# Patient Record
Sex: Female | Born: 1955 | Race: White | Hispanic: No | Marital: Single | Smoking: Current every day smoker
Health system: Southern US, Community
[De-identification: ages and names within clinical notes are randomized; demographics above are authoritative.]

## PROBLEM LIST (undated history)

## (undated) DIAGNOSIS — K589 Irritable bowel syndrome without diarrhea: Secondary | ICD-10-CM

## (undated) DIAGNOSIS — I519 Heart disease, unspecified: Secondary | ICD-10-CM

## (undated) DIAGNOSIS — I219 Acute myocardial infarction, unspecified: Secondary | ICD-10-CM

## (undated) DIAGNOSIS — M1990 Unspecified osteoarthritis, unspecified site: Secondary | ICD-10-CM

## (undated) DIAGNOSIS — I10 Essential (primary) hypertension: Secondary | ICD-10-CM

## (undated) DIAGNOSIS — E785 Hyperlipidemia, unspecified: Secondary | ICD-10-CM

## (undated) DIAGNOSIS — C8330 Diffuse large B-cell lymphoma, unspecified site: Secondary | ICD-10-CM

## (undated) DIAGNOSIS — F419 Anxiety disorder, unspecified: Secondary | ICD-10-CM

## (undated) DIAGNOSIS — K219 Gastro-esophageal reflux disease without esophagitis: Secondary | ICD-10-CM

## (undated) DIAGNOSIS — I2510 Atherosclerotic heart disease of native coronary artery without angina pectoris: Secondary | ICD-10-CM

## (undated) DIAGNOSIS — J449 Chronic obstructive pulmonary disease, unspecified: Secondary | ICD-10-CM

## (undated) HISTORY — DX: Diffuse large B-cell lymphoma, unspecified site: C83.30

## (undated) HISTORY — PX: TUBAL LIGATION: SHX77

## (undated) HISTORY — DX: Hyperlipidemia, unspecified: E78.5

## (undated) HISTORY — DX: Heart disease, unspecified: I51.9

## (undated) HISTORY — PX: CHOLECYSTECTOMY: SHX55

## (undated) HISTORY — DX: Atherosclerotic heart disease of native coronary artery without angina pectoris: I25.10

## (undated) HISTORY — DX: Essential (primary) hypertension: I10

## (undated) HISTORY — PX: CARDIAC CATHETERIZATION: SHX172

## (undated) HISTORY — DX: Chronic obstructive pulmonary disease, unspecified: J44.9

---

## 2002-05-19 ENCOUNTER — Ambulatory Visit (HOSPITAL_COMMUNITY): Admission: RE | Admit: 2002-05-19 | Discharge: 2002-05-19 | Payer: Self-pay | Admitting: Obstetrics & Gynecology

## 2002-05-19 ENCOUNTER — Encounter: Payer: Self-pay | Admitting: Obstetrics & Gynecology

## 2007-12-02 ENCOUNTER — Ambulatory Visit: Payer: Self-pay | Admitting: Family Medicine

## 2007-12-02 DIAGNOSIS — K589 Irritable bowel syndrome without diarrhea: Secondary | ICD-10-CM | POA: Insufficient documentation

## 2007-12-02 DIAGNOSIS — I059 Rheumatic mitral valve disease, unspecified: Secondary | ICD-10-CM | POA: Insufficient documentation

## 2007-12-02 DIAGNOSIS — F17200 Nicotine dependence, unspecified, uncomplicated: Secondary | ICD-10-CM

## 2007-12-02 DIAGNOSIS — J309 Allergic rhinitis, unspecified: Secondary | ICD-10-CM | POA: Insufficient documentation

## 2007-12-05 ENCOUNTER — Ambulatory Visit: Payer: Self-pay | Admitting: Family Medicine

## 2007-12-05 ENCOUNTER — Telehealth (INDEPENDENT_AMBULATORY_CARE_PROVIDER_SITE_OTHER): Payer: Self-pay | Admitting: Family Medicine

## 2007-12-15 ENCOUNTER — Ambulatory Visit: Payer: Self-pay | Admitting: Family Medicine

## 2007-12-16 ENCOUNTER — Encounter (INDEPENDENT_AMBULATORY_CARE_PROVIDER_SITE_OTHER): Payer: Self-pay | Admitting: Family Medicine

## 2007-12-16 LAB — CONVERTED CEMR LAB
Albumin: 4.5 g/dL (ref 3.5–5.2)
Alkaline Phosphatase: 125 units/L — ABNORMAL HIGH (ref 39–117)
BUN: 7 mg/dL (ref 6–23)
Calcium: 9.6 mg/dL (ref 8.4–10.5)
Creatinine, Ser: 0.78 mg/dL (ref 0.40–1.20)
Eosinophils Absolute: 0.1 10*3/uL (ref 0.0–0.7)
Glucose, Bld: 93 mg/dL (ref 70–99)
HCT: 46.8 % — ABNORMAL HIGH (ref 36.0–46.0)
HDL: 33 mg/dL — ABNORMAL LOW (ref >39)
Hemoglobin: 15.7 g/dL — ABNORMAL HIGH (ref 12.0–15.0)
Lymphs Abs: 3.9 10*3/uL (ref 0.7–4.0)
MCHC: 33.5 g/dL (ref 30.0–36.0)
MCV: 93.4 fL (ref 78.0–100.0)
Monocytes Absolute: 0.8 10*3/uL (ref 0.1–1.0)
Monocytes Relative: 9 % (ref 3–12)
Neutrophils Relative %: 47 % (ref 43–77)
Potassium: 4 meq/L (ref 3.5–5.3)
RBC: 5.01 M/uL (ref 3.87–5.11)
Total CHOL/HDL Ratio: 6.9
Triglycerides: 236 mg/dL — ABNORMAL HIGH (ref <150)
WBC: 9.1 10*3/uL (ref 4.0–10.5)

## 2007-12-30 ENCOUNTER — Ambulatory Visit: Payer: Self-pay | Admitting: Family Medicine

## 2007-12-30 DIAGNOSIS — F341 Dysthymic disorder: Secondary | ICD-10-CM

## 2007-12-30 DIAGNOSIS — E785 Hyperlipidemia, unspecified: Secondary | ICD-10-CM | POA: Insufficient documentation

## 2007-12-30 LAB — CONVERTED CEMR LAB: HDL goal, serum: 40 mg/dL

## 2008-01-02 ENCOUNTER — Telehealth (INDEPENDENT_AMBULATORY_CARE_PROVIDER_SITE_OTHER): Payer: Self-pay | Admitting: *Deleted

## 2008-01-03 ENCOUNTER — Telehealth (INDEPENDENT_AMBULATORY_CARE_PROVIDER_SITE_OTHER): Payer: Self-pay | Admitting: Family Medicine

## 2008-01-17 ENCOUNTER — Ambulatory Visit: Payer: Self-pay | Admitting: Family Medicine

## 2008-01-17 LAB — CONVERTED CEMR LAB: LDL Goal: 100 mg/dL

## 2008-01-24 ENCOUNTER — Telehealth (INDEPENDENT_AMBULATORY_CARE_PROVIDER_SITE_OTHER): Payer: Self-pay | Admitting: Family Medicine

## 2008-01-31 ENCOUNTER — Ambulatory Visit: Payer: Self-pay | Admitting: Family Medicine

## 2008-02-28 ENCOUNTER — Ambulatory Visit: Payer: Self-pay | Admitting: Family Medicine

## 2008-04-17 ENCOUNTER — Telehealth (INDEPENDENT_AMBULATORY_CARE_PROVIDER_SITE_OTHER): Payer: Self-pay | Admitting: *Deleted

## 2008-06-25 ENCOUNTER — Ambulatory Visit: Payer: Self-pay | Admitting: Family Medicine

## 2008-06-25 LAB — CONVERTED CEMR LAB: LDL Goal: 100 mg/dL

## 2008-06-26 ENCOUNTER — Encounter (INDEPENDENT_AMBULATORY_CARE_PROVIDER_SITE_OTHER): Payer: Self-pay | Admitting: Family Medicine

## 2008-06-27 LAB — CONVERTED CEMR LAB
ALT: 14 units/L (ref 0–35)
AST: 15 units/L (ref 0–37)
Alkaline Phosphatase: 91 units/L (ref 39–117)
Creatinine, Ser: 0.83 mg/dL (ref 0.40–1.20)
LDL Cholesterol: 144 mg/dL — ABNORMAL HIGH (ref 0–99)
Total Bilirubin: 0.5 mg/dL (ref 0.3–1.2)
Total CHOL/HDL Ratio: 8
VLDL: 72 mg/dL — ABNORMAL HIGH (ref 0–40)

## 2008-07-09 ENCOUNTER — Ambulatory Visit: Payer: Self-pay | Admitting: Family Medicine

## 2008-07-09 LAB — CONVERTED CEMR LAB: LDL Goal: 130 mg/dL

## 2008-09-03 ENCOUNTER — Ambulatory Visit: Payer: Self-pay | Admitting: Family Medicine

## 2008-09-03 ENCOUNTER — Ambulatory Visit (HOSPITAL_COMMUNITY): Admission: RE | Admit: 2008-09-03 | Discharge: 2008-09-03 | Payer: Self-pay | Admitting: Family Medicine

## 2008-09-03 DIAGNOSIS — R030 Elevated blood-pressure reading, without diagnosis of hypertension: Secondary | ICD-10-CM

## 2008-09-04 ENCOUNTER — Encounter (INDEPENDENT_AMBULATORY_CARE_PROVIDER_SITE_OTHER): Payer: Self-pay | Admitting: Family Medicine

## 2008-09-04 LAB — CONVERTED CEMR LAB
Albumin: 4.6 g/dL (ref 3.5–5.2)
Alkaline Phosphatase: 108 units/L (ref 39–117)
Eosinophils Absolute: 0.1 10*3/uL (ref 0.0–0.7)
Glucose, Bld: 88 mg/dL (ref 70–99)
Lipase: 136 units/L — ABNORMAL HIGH (ref 0–75)
Lymphocytes Relative: 44 % (ref 12–46)
Lymphs Abs: 4.5 10*3/uL — ABNORMAL HIGH (ref 0.7–4.0)
MCV: 91.3 fL (ref 78.0–100.0)
Neutrophils Relative %: 47 % (ref 43–77)
Platelets: 311 10*3/uL (ref 150–400)
Potassium: 3.9 meq/L (ref 3.5–5.3)
Sodium: 140 meq/L (ref 135–145)
Total Protein: 7.1 g/dL (ref 6.0–8.3)
WBC: 10.1 10*3/uL (ref 4.0–10.5)

## 2008-09-18 ENCOUNTER — Ambulatory Visit: Payer: Self-pay | Admitting: Family Medicine

## 2008-09-19 ENCOUNTER — Encounter (INDEPENDENT_AMBULATORY_CARE_PROVIDER_SITE_OTHER): Payer: Self-pay | Admitting: Family Medicine

## 2008-09-19 LAB — CONVERTED CEMR LAB: Lipase: 73 units/L (ref 0–75)

## 2008-11-08 ENCOUNTER — Ambulatory Visit: Payer: Self-pay | Admitting: Family Medicine

## 2008-11-08 LAB — CONVERTED CEMR LAB: LDL Goal: 100 mg/dL

## 2008-11-09 ENCOUNTER — Encounter (INDEPENDENT_AMBULATORY_CARE_PROVIDER_SITE_OTHER): Payer: Self-pay | Admitting: Family Medicine

## 2008-11-09 LAB — CONVERTED CEMR LAB
ALT: 13 units/L (ref 0–35)
AST: 16 units/L (ref 0–37)
Albumin: 4.4 g/dL (ref 3.5–5.2)
Alkaline Phosphatase: 100 units/L (ref 39–117)
Calcium: 9.8 mg/dL (ref 8.4–10.5)
Chloride: 105 meq/L (ref 96–112)
HDL: 36 mg/dL — ABNORMAL LOW (ref >39)
LDL Cholesterol: 146 mg/dL — ABNORMAL HIGH (ref 0–99)
Potassium: 4.2 meq/L (ref 3.5–5.3)
Sodium: 140 meq/L (ref 135–145)

## 2008-12-20 ENCOUNTER — Ambulatory Visit: Payer: Self-pay | Admitting: Family Medicine

## 2008-12-20 LAB — CONVERTED CEMR LAB
Cholesterol, target level: 200 mg/dL
LDL Goal: 130 mg/dL

## 2009-01-02 ENCOUNTER — Encounter (INDEPENDENT_AMBULATORY_CARE_PROVIDER_SITE_OTHER): Payer: Self-pay | Admitting: Family Medicine

## 2009-01-31 ENCOUNTER — Ambulatory Visit: Payer: Self-pay | Admitting: Family Medicine

## 2009-03-14 ENCOUNTER — Ambulatory Visit: Payer: Self-pay | Admitting: Family Medicine

## 2009-04-25 ENCOUNTER — Encounter (INDEPENDENT_AMBULATORY_CARE_PROVIDER_SITE_OTHER): Payer: Self-pay | Admitting: Family Medicine

## 2009-07-27 HISTORY — PX: CORONARY STENT PLACEMENT: SHX1402

## 2009-10-14 ENCOUNTER — Ambulatory Visit: Payer: Self-pay | Admitting: Family Medicine

## 2009-10-14 DIAGNOSIS — J019 Acute sinusitis, unspecified: Secondary | ICD-10-CM | POA: Insufficient documentation

## 2009-10-17 ENCOUNTER — Telehealth: Payer: Self-pay | Admitting: Physician Assistant

## 2009-11-28 ENCOUNTER — Ambulatory Visit: Payer: Self-pay | Admitting: Family Medicine

## 2009-12-02 ENCOUNTER — Telehealth: Payer: Self-pay | Admitting: Physician Assistant

## 2009-12-02 ENCOUNTER — Encounter (INDEPENDENT_AMBULATORY_CARE_PROVIDER_SITE_OTHER): Payer: Self-pay

## 2009-12-09 ENCOUNTER — Ambulatory Visit (HOSPITAL_COMMUNITY): Admission: RE | Admit: 2009-12-09 | Discharge: 2009-12-09 | Payer: Self-pay | Admitting: Family Medicine

## 2009-12-09 ENCOUNTER — Encounter: Payer: Self-pay | Admitting: Physician Assistant

## 2009-12-09 ENCOUNTER — Ambulatory Visit: Payer: Self-pay | Admitting: Family Medicine

## 2009-12-09 DIAGNOSIS — J13 Pneumonia due to Streptococcus pneumoniae: Secondary | ICD-10-CM | POA: Insufficient documentation

## 2009-12-12 ENCOUNTER — Ambulatory Visit: Payer: Self-pay | Admitting: Family Medicine

## 2009-12-18 ENCOUNTER — Telehealth: Payer: Self-pay | Admitting: Physician Assistant

## 2009-12-19 ENCOUNTER — Other Ambulatory Visit: Payer: Self-pay | Admitting: Emergency Medicine

## 2009-12-19 ENCOUNTER — Inpatient Hospital Stay (HOSPITAL_COMMUNITY): Admission: EM | Admit: 2009-12-19 | Discharge: 2009-12-23 | Payer: Self-pay | Admitting: Interventional Cardiology

## 2009-12-19 ENCOUNTER — Telehealth: Payer: Self-pay | Admitting: Family Medicine

## 2009-12-19 ENCOUNTER — Encounter: Payer: Self-pay | Admitting: Physician Assistant

## 2009-12-19 DIAGNOSIS — I219 Acute myocardial infarction, unspecified: Secondary | ICD-10-CM

## 2009-12-19 HISTORY — DX: Acute myocardial infarction, unspecified: I21.9

## 2009-12-25 ENCOUNTER — Telehealth: Payer: Self-pay | Admitting: Physician Assistant

## 2010-01-06 ENCOUNTER — Telehealth: Payer: Self-pay | Admitting: Physician Assistant

## 2010-01-16 ENCOUNTER — Encounter: Payer: Self-pay | Admitting: Physician Assistant

## 2010-01-16 ENCOUNTER — Ambulatory Visit: Payer: Self-pay | Admitting: Family Medicine

## 2010-01-16 DIAGNOSIS — R233 Spontaneous ecchymoses: Secondary | ICD-10-CM | POA: Insufficient documentation

## 2010-01-16 DIAGNOSIS — I10 Essential (primary) hypertension: Secondary | ICD-10-CM | POA: Insufficient documentation

## 2010-01-16 DIAGNOSIS — I2510 Atherosclerotic heart disease of native coronary artery without angina pectoris: Secondary | ICD-10-CM | POA: Insufficient documentation

## 2010-01-16 DIAGNOSIS — I8000 Phlebitis and thrombophlebitis of superficial vessels of unspecified lower extremity: Secondary | ICD-10-CM | POA: Insufficient documentation

## 2010-01-30 ENCOUNTER — Ambulatory Visit: Payer: Self-pay | Admitting: Family Medicine

## 2010-01-30 DIAGNOSIS — H6120 Impacted cerumen, unspecified ear: Secondary | ICD-10-CM

## 2010-02-05 ENCOUNTER — Encounter: Payer: Self-pay | Admitting: Physician Assistant

## 2010-02-10 ENCOUNTER — Encounter (HOSPITAL_COMMUNITY): Admission: RE | Admit: 2010-02-10 | Discharge: 2010-03-12 | Payer: Self-pay | Admitting: Interventional Cardiology

## 2010-02-13 ENCOUNTER — Ambulatory Visit: Payer: Self-pay | Admitting: Family Medicine

## 2010-02-13 DIAGNOSIS — H6980 Other specified disorders of Eustachian tube, unspecified ear: Secondary | ICD-10-CM

## 2010-03-12 ENCOUNTER — Encounter (HOSPITAL_COMMUNITY): Admission: RE | Admit: 2010-03-12 | Discharge: 2010-04-11 | Payer: Self-pay | Admitting: Interventional Cardiology

## 2010-03-18 ENCOUNTER — Encounter: Payer: Self-pay | Admitting: Physician Assistant

## 2010-04-08 ENCOUNTER — Ambulatory Visit: Payer: Self-pay | Admitting: Family Medicine

## 2010-04-08 DIAGNOSIS — D239 Other benign neoplasm of skin, unspecified: Secondary | ICD-10-CM | POA: Insufficient documentation

## 2010-04-08 DIAGNOSIS — J209 Acute bronchitis, unspecified: Secondary | ICD-10-CM

## 2010-04-11 ENCOUNTER — Encounter (HOSPITAL_COMMUNITY): Admission: RE | Admit: 2010-04-11 | Discharge: 2010-05-11 | Payer: Self-pay | Admitting: Interventional Cardiology

## 2010-04-14 ENCOUNTER — Encounter: Payer: Self-pay | Admitting: Physician Assistant

## 2010-04-22 ENCOUNTER — Emergency Department (HOSPITAL_COMMUNITY): Admission: EM | Admit: 2010-04-22 | Discharge: 2010-04-22 | Payer: Self-pay | Admitting: Emergency Medicine

## 2010-04-28 ENCOUNTER — Encounter: Payer: Self-pay | Admitting: Physician Assistant

## 2010-04-28 ENCOUNTER — Ambulatory Visit: Payer: Self-pay | Admitting: Family Medicine

## 2010-04-28 DIAGNOSIS — R079 Chest pain, unspecified: Secondary | ICD-10-CM | POA: Insufficient documentation

## 2010-04-29 LAB — CONVERTED CEMR LAB
Basophils Relative: 0 % (ref 0–1)
Eosinophils Absolute: 0.1 10*3/uL (ref 0.0–0.7)
MCHC: 32.1 g/dL (ref 30.0–36.0)
MCV: 96.7 fL (ref 78.0–100.0)
Monocytes Relative: 9 % (ref 3–12)
Neutro Abs: 6.9 10*3/uL (ref 1.7–7.7)
Neutrophils Relative %: 59 % (ref 43–77)
Platelets: 298 10*3/uL (ref 150–400)
RBC: 4.6 M/uL (ref 3.87–5.11)
WBC: 11.7 10*3/uL — ABNORMAL HIGH (ref 4.0–10.5)

## 2010-05-14 ENCOUNTER — Ambulatory Visit: Payer: Self-pay | Admitting: Family Medicine

## 2010-05-14 ENCOUNTER — Encounter: Payer: Self-pay | Admitting: Physician Assistant

## 2010-05-14 DIAGNOSIS — J42 Unspecified chronic bronchitis: Secondary | ICD-10-CM | POA: Insufficient documentation

## 2010-06-10 ENCOUNTER — Telehealth: Payer: Self-pay | Admitting: Family Medicine

## 2010-06-13 ENCOUNTER — Encounter: Payer: Self-pay | Admitting: Physician Assistant

## 2010-07-10 ENCOUNTER — Ambulatory Visit: Payer: Self-pay | Admitting: Family Medicine

## 2010-08-17 ENCOUNTER — Encounter: Payer: Self-pay | Admitting: Family Medicine

## 2010-08-28 NOTE — Progress Notes (Signed)
Summary: [facility] PAPERS  Phone Note Call from Patient   Summary of Call: HER [facility] PAPER IS HER PAPERS READY AND SHE NEEDS THEM FAXED BACK  AT [phone] Initial call taken by: Lind Guest,  Dec 19, 2009 10:25 AM  Follow-up for Phone Call        Do you know what papers she is talking about? Follow-up by: Everitt Amber LPN,  Dec 19, 2009 1:59 PM  Additional Follow-up for Phone Call Additional follow up Details #1::        I filled out paperwork yesterday & put it on Sally's desk. Additional Follow-up by: Esperanza Sheets PA,  Dec 19, 2009 2:22 PM

## 2010-08-28 NOTE — Progress Notes (Signed)
Summary: cough med  Phone Note Call from Patient   Summary of Call: wants to know if its okay to take cough meds with what they gave her today.  [phone] Initial call taken by: Rudene Anda,  Dec 02, 2009 4:54 PM  Follow-up for Phone Call        Yes she can Follow-up by: Esperanza Sheets PA,  Dec 03, 2009 10:29 AM  Additional Follow-up for Phone Call Additional follow up Details #1::        Phone Call Completed Additional Follow-up by: Adella Hare LPN,  Dec 03, 2009 10:44 AM

## 2010-08-28 NOTE — Progress Notes (Signed)
Summary: still weak  Phone Note Call from Patient   Summary of Call: she is still weak and still cong. 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 and she is tired and weak just from that  [facility] her back at [phone] and let her know what can she can do  she will need another note for work  please call her back Initial call taken by: Lind Guest,  Dec 02, 2009 8:11 AM  Follow-up for Phone Call        She was supposed to go back to work today but didn't feel like it so does she need to come back in or will you give her something else? Follow-up by: Everitt Amber LPN,  Dec 02, 452 1:24 PM  Additional Follow-up for Phone Call Additional follow up Details #1::        Rx Medrol dospak #1 no rf, and Levaquin 750mg  #5 one daily   no rf.  Ok to extend off work note x 2-3 days.  Will need appt if not improving by thurs or fri. Additional Follow-up by: Esperanza Sheets PA,  Dec 02, 2009 1:58 PM    Additional Follow-up for Phone Call Additional follow up Details #2::    patient aware Follow-up by: Everitt Amber LPN,  Dec 03, 979 3:10 PM  New/Updated Medications: MEDROL (PAK) 4 MG TABS (METHYLPREDNISOLONE) UAD LEVAQUIN 750 MG TABS (LEVOFLOXACIN) Take one tab once daily Prescriptions: LEVAQUIN 750 MG TABS (LEVOFLOXACIN) Take one tab once daily  #5 x 0   Entered by:   Everitt Amber LPN   Authorized by:   Esperanza Sheets PA   Signed by:   Everitt Amber LPN on 19/14/7829   Method used:   [facility] to        [facility] [phone]* (retail)       [address]       [place], [place]  [address]       Ph: [phone] or [phone]       Fax: [phone]   RxID:   [id number] MEDROL (PAK) 4 MG TABS (METHYLPREDNISOLONE) UAD  #1 x 0   Entered by:   Everitt Amber LPN   Authorized by:   Esperanza Sheets PA   Signed by:   Everitt Amber LPN on 32/95/1884   Method used:   [facility]ically to        [facility] [phone]* (retail)       [address]       [place], [place]  [address]       Ph: [phone] or [phone]       Fax: [phone]   RxID:   [id number]

## 2010-08-28 NOTE — Miscellaneous (Signed)
Summary: Rehab Report  Rehab Report   Imported By: Lind Guest 06/17/2010 08:33:42  _____________________________________________________________________  External Attachment:    Type:   Image     Comment:   External Document

## 2010-08-28 NOTE — Progress Notes (Signed)
Summary: went to [employer] [facility] Note Call from Patient   Summary of Call: she has cancelled her appt for tomorrow because she webt to [place] on thursday and they sent her to [place] she had a heart attack and they put a stint in and they checked her chest and the pne. is cleared up she came home monday  Initial call taken by: Lind Guest,  December 25, 2009 9:36 AM

## 2010-08-28 NOTE — Assessment & Plan Note (Signed)
Summary: SICK   Vital Signs:  Patient profile:   55 year old female Height:      61.5 inches Weight:      120.75 pounds BMI:     22.53 O2 Sat:      99 % Pulse rate:   91 / minute BP sitting:   130 / 70 CC: thinks she has inner ear, dizzy and right ear feels like it might have liquid in it, she did use an ear wax remover a couple of days ago. room 1 Is Patient Diabetic? No   Primary Provider:  Esperanza Sheets PA  CC:  thinks she has inner ear, dizzy and right ear feels like it might have liquid in it, and she did use an ear wax remover a couple of days ago. room 1.  History of Present Illness: Pt states she has a hx of ear wax build up in her ears. She has had to have the wax removed x yrs.  In the last couple of yrs she has tried to keep this controlled with an over the counter ear wax removal kit.  She felt recently like she had build up in her Rt ear, so she used the over the counter kit and did get wax out.  Her syptoms seemed to improve for a few days and is feeling full again and having a hard time hearing out of her Rt ear.  No pain.  Pt states her grandson is doing better.  She did try 1/2 tab of the Xanax but made her very sleepy.  She doesnt feel like she needs any at this time and will hold onto what she has for the future.  Still bruising very easily.  Pt is on Plavix.   Allergies: 1)  ! Trilipix (Choline Fenofibrate) 2)  ! * Omnicef (Cefdinir) 3)  ! Percocet  Past History:  Past medical history reviewed for relevance to current acute and chronic problems.  Past Medical History: Reviewed history from 01/16/2010 and no changes required. Current Problems:  ANXIETY DEPRESSION (ICD-300.4) HYPERLIPIDEMIA (ICD-272.4) Family Hx of COLON CANCER (ICD-153.9) TOBACCO ABUSE (ICD-305.1) ALLERGIC RHINITIS (ICD-477.9) IBS (ICD-564.1) MITRAL VALVE PROLAPSE (ICD-424.0) Coronary artery disease Hypertension  Review of Systems General:  Denies chills and fever. ENT:  Denies  ear discharge, earache, nasal congestion, and sore throat. Derm:  Complains of changes in color of skin.  Physical Exam  General:  Well-developed,well-nourished,in no acute distress; alert,appropriate and cooperative throughout examination Head:  Normocephalic and atraumatic without obvious abnormalities. No apparent alopecia or balding. Ears:  Bilat EAC obstructed with cerumen.  After irrg bilat EAC clear and TMs neg. Nose:  External nasal examination shows no deformity or inflammation. Nasal mucosa are pink and moist without lesions or exudates. Mouth:  Oral mucosa and oropharynx without lesions or exudates.   Neck:  No deformities, masses, or tenderness noted. Lungs:  Normal respiratory effort, chest expands symmetrically. Lungs are clear to auscultation, no crackles or wheezes. Heart:  Normal rate and regular rhythm. S1 and S2 normal without gallop, murmur, click, rub or other extra sounds. Skin:  2 eccymotic areas on RLE, each approx quarter sized, purple. Cervical Nodes:  No lymphadenopathy noted Psych:  Cognition and judgment appear intact. Alert and cooperative with normal attention span and concentration. No apparent delusions, illusions, hallucinations   Impression & Recommendations:  Problem # 1:  CERUMEN IMPACTION, BILATERAL (ICD-380.4) Assessment Deteriorated  Orders: Cerumen Impaction Removal ([address])  Problem # 2:  ECCHYMOSES, SPONTANEOUS (ICD-782.7) Assessment: Unchanged Again  reassured pt that this is due to her Plavix.  Problem # 3:  HYPERTENSION (ICD-401.9) Assessment: Comment Only  Her updated medication list for this problem includes:    Metoprolol Succinate 25 Mg Xr24h-tab (Metoprolol succinate) .Marland Kitchen... Take 1 daily  BP today: 130/70 Prior BP: 120/74 (01/16/2010)  Prior 10 Yr Risk Heart Disease: 11 % (12/20/2008)  Labs Reviewed: K+: 4.2 (11/09/2008) Creat: : 0.76 (11/09/2008)   Chol: 234 (11/09/2008)   HDL: 36 (11/09/2008)   LDL: 146 (11/09/2008)   TG:  259 (11/09/2008)  Problem # 4:  ANXIETY DEPRESSION (ICD-300.4) Assessment: Improved  Complete Medication List: 1)  Metoprolol Succinate 25 Mg Xr24h-tab (Metoprolol succinate) .... Take 1 daily 2)  Aspirin 325 Mg Tabs (Aspirin) .... Take 1 daily 3)  Plavix 75 Mg Tabs (Clopidogrel bisulfate) .... Take 1 daily 4)  Simvastatin 40 Mg Tabs (Simvastatin) .... Take 1 at bedtime 5)  Nitrostat 0.4 Mg Subl (Nitroglycerin) .... As directed 6)  Nicotine 21 Mg/24hr Pt24 (Nicotine) .... Use 1 daily 7)  Alprazolam 0.25 Mg Tabs (Alprazolam) .... Take 1 every 8 hrs as needed for anxiety  Patient Instructions: 1)  Please schedule a follow-up appointment as needed. 2)  Continue the over the counter ear wax remover as needed.  You may need to use it more often to keep the ear wax soft.  You can use it once every week or two.

## 2010-08-28 NOTE — Assessment & Plan Note (Signed)
Summary: er follow up- room 1   Vital Signs:  Patient profile:   55 year old female Height:      61.5 inches Weight:      126.75 pounds BMI:     23.65 O2 Sat:      97 % on Room air Pulse rate:   90 / minute Resp:     16 per minute BP sitting:   116 / 70  (left arm)  Vitals Entered By: Adella Hare LPN (April 28, 2010 4:15 PM) CC: er follow up Is Patient Diabetic? No Pain Assessment Patient in pain? no        Primary Breslin Hemann:  Esperanza Sheets PA  CC:  er follow up.  History of Present Illness: Seen at ER.  Chest pain returned Tues.  CXR, labs and EKG monitor neg per pt.  Wanted to admit her for observation but pt signed out AMA.  Lt sided chest pain, described as either a pressure or feels like something squeezing her heart.  Did take Nitro first 2 episodes and relieved discomfort.  Went to ER after the 2nd episode on Tues.  States she has only had mild intermittent pressure since then.  Has not taken anymore Nitro.  No radiation, difficulty breathing, or diaphoresis. Next appt with cardiologist Jan or Feb. Pt states she has alot of stress. Restarted her Xanax and had her best day today.  Has some cough.  Her chronic cough, nonproductive. No fever or chills.  Also saw Dr Gearldine Bienenstock regarding her ear.  Has a SOM.  Is being treated with Nasal steroid and considering PE tubes.   Current Medications (verified): 1)  Metoprolol Succinate 25 Mg Xr24h-Tab (Metoprolol Succinate) .... Take 1 Daily 2)  Aspirin 325 Mg Tabs (Aspirin) .... Take 1 Daily 3)  Plavix 75 Mg Tabs (Clopidogrel Bisulfate) .... Take 1 Daily 4)  Simvastatin 40 Mg Tabs (Simvastatin) .... Take 1 At Bedtime 5)  Nitrostat 0.4 Mg Subl (Nitroglycerin) .... As Directed 6)  Nicotine 21 Mg/24hr Pt24 (Nicotine) .... Use 1 Daily 7)  Alprazolam 0.25 Mg Tabs (Alprazolam) .... Take 1 Every 8 Hrs As Needed For Anxiety 8)  Fluticasone Propionate 50 Mcg/act Susp (Fluticasone Propionate) .... Use 2 Sprays Each Nostril Once  Daily  Allergies (verified): 1)  ! Trilipix (Choline Fenofibrate) 2)  ! * Omnicef (Cefdinir) 3)  ! Percocet  Past History:  Past medical history reviewed for relevance to current acute and chronic problems.  Past Medical History: Reviewed history from 01/16/2010 and no changes required. Current Problems:  ANXIETY DEPRESSION (ICD-300.4) HYPERLIPIDEMIA (ICD-272.4) Family Hx of COLON CANCER (ICD-153.9) TOBACCO ABUSE (ICD-305.1) ALLERGIC RHINITIS (ICD-477.9) IBS (ICD-564.1) MITRAL VALVE PROLAPSE (ICD-424.0) Coronary artery disease Hypertension  Review of Systems General:  Denies chills and fever. ENT:  Complains of earache and postnasal drainage; denies nasal congestion, sinus pressure, and sore throat. CV:  Complains of chest pain or discomfort; denies lightheadness and palpitations. Resp:  Complains of cough; denies shortness of breath, sputum productive, and wheezing. GI:  Denies gas, indigestion, nausea, and vomiting.  Physical Exam  General:  Well-developed,well-nourished,in no acute distress; alert,appropriate and cooperative throughout examination Head:  Normocephalic and atraumatic without obvious abnormalities. No apparent alopecia or balding. Ears:  External ear exam shows no significant lesions or deformities.  Otoscopic examination reveals clear canals, tympanic membranes are intact bilaterally without bulging, retraction, inflammation or discharge. Hearing is grossly normal bilaterally. Nose:  External nasal examination shows no deformity or inflammation. Nasal mucosa are pink and moist  without lesions or exudates. Mouth:  Oral mucosa and oropharynx without lesions or exudates.  Teeth in good repair. Neck:  No deformities, masses, or tenderness noted. Lungs:  Normal respiratory effort, chest expands symmetrically. Lungs are clear to auscultation, no crackles or wheezes. Heart:  Normal rate and regular rhythm. S1 and S2 normal without gallop, murmur, click, rub or  other extra sounds. Cervical Nodes:  No lymphadenopathy noted Psych:  Cognition and judgment appear intact. Alert and cooperative with normal attention span and concentration. No apparent delusions, illusions, hallucinations   Impression & Recommendations:  Problem # 1:  CHEST PAIN (ICD-786.50) Assessment Comment Only Probable angina. Advised pt to use nitro as needed.  Return to ER for pain not relieved with Nitro, or if freq Nitro use.  Pt will schedule f/u appt with cardiologist this week.  Problem # 2:  LEUKOCYTOSIS (ICD-288.60) Assessment: New  Orders: T-CBC w/Diff ([address])  Problem # 3:  HYPERTENSION (ICD-401.9) Assessment: Comment Only  Her updated medication list for this problem includes:    Metoprolol Succinate 25 Mg Xr24h-tab (Metoprolol succinate) .Marland Kitchen... Take 1 daily  BP today: 116/70 Prior BP: 124/72 (04/08/2010)  Prior 10 Yr Risk Heart Disease: 11 % (12/20/2008)  Labs Reviewed: K+: 4.2 (11/09/2008) Creat: : 0.76 (11/09/2008)   Chol: 234 (11/09/2008)   HDL: 36 (11/09/2008)   LDL: 146 (11/09/2008)   TG: 259 (11/09/2008)  Complete Medication List: 1)  Metoprolol Succinate 25 Mg Xr24h-tab (Metoprolol succinate) .... Take 1 daily 2)  Aspirin 325 Mg Tabs (Aspirin) .... Take 1 daily 3)  Plavix 75 Mg Tabs (Clopidogrel bisulfate) .... Take 1 daily 4)  Simvastatin 40 Mg Tabs (Simvastatin) .... Take 1 at bedtime 5)  Nitrostat 0.4 Mg Subl (Nitroglycerin) .... As directed 6)  Nicotine 21 Mg/24hr Pt24 (Nicotine) .... Use 1 daily 7)  Alprazolam 0.25 Mg Tabs (Alprazolam) .... Take 1 every 8 hrs as needed for anxiety 8)  Fluticasone Propionate 50 Mcg/act Susp (Fluticasone propionate) .... Use 2 sprays each nostril once daily 9)  Zithromax Z-pak 250 Mg Tabs (Azithromycin) .... Uad  Patient Instructions: 1)  Keep your next appt in approx 2 weeks. 2)  Call your cardiologist for follow up appt. 3)  Continue taking your xanax and use your Nitro as needed. 4)  Have your  blood drawn today.

## 2010-08-28 NOTE — Progress Notes (Signed)
  Phone Note Call from Patient   Caller: Patient Summary of Call: fever, body aches, chest congestion  Initial call taken by: Adella Hare LPN,  June 10, 2010 9:27 AM  Follow-up for Phone Call        advised patient with her history it is important that she be evaluated today, absolutely no openings here today, advised urgent care and patient agrees Follow-up by: Adella Hare LPN,  June 10, 2010 9:29 AM

## 2010-08-28 NOTE — Letter (Signed)
Summary: Out of Work  [facility]  [address]   [place], [place] [address]   Phone: [phone]  Fax: [phone]    Nov 28, 2009   Employee:  WESLEIGH MARKOVIC    To Whom It May Concern:   For Medical reasons, please excuse the above named employee from work for the following dates:  Start:   11/28/09  End:   11/30/09 to return with no restrictions  If you need additional information, please feel free to contact our office.         Sincerely,    Esperanza Sheets, [place]

## 2010-08-28 NOTE — Assessment & Plan Note (Signed)
Summary: sick- room 2   Vital Signs:  Patient profile:   55 year old female Height:      61.5 inches Weight:      120.75 pounds BMI:     22.53 O2 Sat:      96 % on Room air Pulse rate:   95 / minute Resp:     16 per minute BP sitting:   120 / 70  (left arm)  Vitals Entered By: Adella Hare LPN (Dec 09, 2009 9:56 AM) CC: chest congestion, fever, chills, body aches Is Patient Diabetic? No Pain Assessment Patient in pain? no      Comments did not bring meds to ov   Primary Provider:  Franchot Heidelberg, MD  CC:  chest congestion, fever, chills, and body aches.  History of Present Illness: Pt is here today with c/o nonprod cough.  No difficulty breathing.  Chest hurts with deep breath.  No wheezing.  Foul taste from nasal congestion has returned.  Is running a low grade fever. Feels like she has not improved from her 11-28-09 OV and has not been back to work.  She is tired and is now having body aches too.  She has completed ZPak and 5 day prescription of Levaquin. She is not really smoking now.  If she lights one is unable to smoke it because of her cough etc.   Allergies (verified): 1)  ! Trilipix (Choline Fenofibrate) 2)  ! * Omnicef (Cefdinir)  Past History:  Past medical history reviewed for relevance to current acute and chronic problems.  Past Medical History: Reviewed history from 06/25/2008 and no changes required. Current Problems:  ANXIETY DEPRESSION (ICD-300.4) HYPERLIPIDEMIA (ICD-272.4) ELEVATED BLOOD PRESSURE WITHOUT DIAGNOSIS OF HYPERTENSION (ICD-796.2) Family Hx of COLON CANCER (ICD-153.9) TOBACCO ABUSE (ICD-305.1) BRONCHITIS, ACUTE (ICD-466.0) ALLERGIC RHINITIS (ICD-477.9) IBS (ICD-564.1) MITRAL VALVE PROLAPSE (ICD-424.0)  Review of Systems General:  Complains of fever; denies chills. ENT:  Complains of nasal congestion and postnasal drainage; denies earache, sinus pressure, and sore throat. CV:  Denies chest pain or discomfort. Resp:  Complains  of cough; denies shortness of breath, sputum productive, and wheezing.  Physical Exam  General:  Well-developed,well-nourished,in no acute distress; alert,appropriate and cooperative throughout examination Head:  Normocephalic and atraumatic without obvious abnormalities. No apparent alopecia or balding. Ears:  External ear exam shows no significant lesions or deformities.  Otoscopic examination reveals clear canals, tympanic membranes are intact bilaterally without bulging, retraction, inflammation or discharge. Hearing is grossly normal bilaterally. Nose:  External nasal examination shows no deformity or inflammation. Nasal mucosa are pink and moist without lesions or exudates.L maxillary sinus tenderness and R maxillary sinus tenderness.   Mouth:  Oral mucosa and oropharynx without lesions or exudates.  Teeth in good repair. Neck:  No deformities, masses, or tenderness noted. Lungs:  rales noted LLLnormal respiratory effort and no wheezes.   Heart:  Normal rate and regular rhythm. S1 and S2 normal without gallop, murmur, click, rub or other extra sounds. Cervical Nodes:  No lymphadenopathy noted Psych:  Cognition and judgment appear intact. Alert and cooperative with normal attention span and concentration. No apparent delusions, illusions, hallucinations   Impression & Recommendations:  Problem # 1:  PNEUMONIA, LEFT LOWER LOBE (ICD-481) Assessment New  The following medications were removed from the medication list:    Azithromycin 250 Mg Tabs (Azithromycin) .Marland Kitchen... Take 2 tabs today, then 1 daily x 4 days    Levaquin 750 Mg Tabs (Levofloxacin) .Marland Kitchen... Take one tab once daily Her updated  medication list for this problem includes:    Doxycycline Hyclate 100 Mg Caps (Doxycycline hyclate) .Marland Kitchen... Take 1 two times a day for 10 days  Orders: Chest - 2 Views (Chest 2 Views) Depo- Medrol 80mg  (J1040) Admin of Therapeutic Inj  intramuscular or subcutaneous (29562)  Problem # 2:  SINUSITIS,  ACUTE (ICD-461.9) Assessment: Unchanged  The following medications were removed from the medication list:    Azithromycin 250 Mg Tabs (Azithromycin) .Marland Kitchen... Take 2 tabs today, then 1 daily x 4 days    Promethazine-codeine 6.25-10 Mg/56ml Syrp (Promethazine-codeine) .Marland Kitchen... Take 2 tsp every 6 hrs as needed for cough    Levaquin 750 Mg Tabs (Levofloxacin) .Marland Kitchen... Take one tab once daily Her updated medication list for this problem includes:    Doxycycline Hyclate 100 Mg Caps (Doxycycline hyclate) .Marland Kitchen... Take 1 two times a day for 10 days    Promethazine-codeine 6.25-10 Mg/58ml Syrp (Promethazine-codeine) .Marland Kitchen... Take 1 - 2 tsp every 6 hrs as needed for cough  Problem # 3:  TOBACCO ABUSE (ICD-305.1) Assessment: Comment Only  Encouraged smoking cessation and discussed different methods for smoking cessation.   Complete Medication List: 1)  Doxycycline Hyclate 100 Mg Caps (Doxycycline hyclate) .... Take 1 two times a day for 10 days 2)  Promethazine-codeine 6.25-10 Mg/86ml Syrp (Promethazine-codeine) .... Take 1 - 2 tsp every 6 hrs as needed for cough 3)  Ventolin Hfa 108 (90 Base) Mcg/act Aers (Albuterol sulfate) .... Use 2 puffs every 4 hrs  Patient Instructions: 1)  Recheck appt Wed or Thursday. 2)  Tobacco is very bad for your health and your loved ones! You Should stop smoking!. 3)  Stop Smoking Tips: Choose a Quit date. Cut down before the Quit date. decide what you will do as a substitute when you feel the urge to smoke(gum,toothpick,exercise). 4)  You have received a shot of Depo Medrol today. 5)  I have prescribed Doxycyline for your antibiotic, and refilled your cough medicine. 6)  I have ordered a chest xray for you.  Have this done today. Prescriptions: VENTOLIN HFA 108 (90 BASE) MCG/ACT AERS (ALBUTEROL SULFATE) use 2 puffs every 4 hrs  #1 x 0   Entered and Authorized by:   Esperanza Sheets PA   Signed by:   Esperanza Sheets PA on 12/09/2009   Method used:   Print[facility]       [facility]   [address] [phone]* (retail)       [address]       [place], [place]  [address]       Ph: [phone] or [phone]       Fax: [phone]   RxID:   [id number] PROMETHAZINE-CODEINE 6.25-10 MG/5ML SYRP (PROMETHAZINE-CODEINE) take 1 - 2 tsp every 6 hrs as needed for cough  #6 oz x 0   Entered and Authorized by:   Esperanza Sheets PA   Signed by:   Esperanza Sheets PA on 12/09/2009   Method used:   Printed then faxed [facility] ...       [facility]  [address] [phone]* (retail)       [address]       [place], [place]  [address]       Ph: [phone] or [phone]       Fax: [phone]   RxID:   [id number] DOXYCYCLINE HYCLATE 100 MG CAPS (DOXYCYCLINE HYCLATE) take 1 two times a day for  10 days  #20 x 0   Entered and Authorized by:   Esperanza Sheets PA   Signed by:   Esperanza Sheets PA on 12/09/2009   Method used:   Electronically to        [facility] [phone]* (retail)       [address]       [place], [place]  [address]       Ph: [phone] or [phone]       Fax: [phone]   RxID:   [id number]    Medication Administration  Injection # 1:    Medication: Depo- Medrol 80mg     Diagnosis: PNEUMONIA, LEFT LOWER LOBE (ICD-481)    Route: IM    Site: LUOQ gluteus    Exp Date: 1/12    Lot #: Johny [id number]    Mfr: [employer]    Patient tolerated injection without complications    Given by: Adella Hare LPN (Dec 09, 2009 10:40 AM)  Orders Added: 1)  Chest - 2 Views [Chest 2 Views] 2)  Depo- Medrol 80mg  [J1040] 3)  Admin of Therapeutic Inj  intramuscular or subcutaneous [96372] 4)  Est. Patient Level IV [84166]

## 2010-08-28 NOTE — Letter (Signed)
Summary: ENT  ENT   Imported By: Lind Guest 04/16/2010 13:01:35  _____________________________________________________________________  External Attachment:    Type:   Image     Comment:   External Document

## 2010-08-28 NOTE — Assessment & Plan Note (Signed)
Summary: EARS- room 1   Vital Signs:  Patient profile:   55 year old female Height:      61.5 inches Weight:      121.25 pounds BMI:     22.62 O2 Sat:      98 % on Room air Pulse rate:   83 / minute Resp:     16 per minute BP sitting:   122 / 70  (left arm)  Vitals Entered By: Adella Hare LPN (February 13, 2010 4:10 PM) CC: ears still feel stopped up Is Patient Diabetic? No Pain Assessment Patient in pain? no        Primary Provider:  Esperanza Sheets PA  CC:  ears still feel stopped up.  History of Present Illness: Pt presents today with c/o fluid in her Rt ear.  She can feel it moving with mvmt of her head.  Improves if her ear pops with blowing her nose, etc. But then comes back after awhile.  She has had a little nasal congestion but is clear in color.  No sinus pressure.  Is having some sneezing too. Taking Allegra x  6 days.  But doesn't seem to helping much.  Pt also has been going to cardiac rehab.  Blood pressure goes up when exercising but then is dropping afterwards.  Yesterday was 100/ ?Marland Kitchen  Pt states later dropped as low as 90/60.  She is feeling lightheaded after exercising.  She has been advised by cardiac rehab to contact cardiologist regarding her BP.    Allergies (verified): 1)  ! Trilipix (Choline Fenofibrate) 2)  ! * Omnicef (Cefdinir) 3)  ! Percocet  Past History:  Past medical history reviewed for relevance to current acute and chronic problems.  Past Medical History: Reviewed history from 01/16/2010 and no changes required. Current Problems:  ANXIETY DEPRESSION (ICD-300.4) HYPERLIPIDEMIA (ICD-272.4) Family Hx of COLON CANCER (ICD-153.9) TOBACCO ABUSE (ICD-305.1) ALLERGIC RHINITIS (ICD-477.9) IBS (ICD-564.1) MITRAL VALVE PROLAPSE (ICD-424.0) Coronary artery disease Hypertension  Review of Systems General:  Denies chills and fever. ENT:  Complains of nasal congestion and postnasal drainage; denies earache, sinus pressure, and sore throat. CV:   Denies chest pain or discomfort. Allergy:  Complains of seasonal allergies and sneezing; denies itching eyes.  Physical Exam  General:  Well-developed,well-nourished,in no acute distress; alert,appropriate and cooperative throughout examination Head:  Normocephalic and atraumatic without obvious abnormalities. No apparent alopecia or balding. Ears:  External ear exam shows no significant lesions or deformities.  Otoscopic examination reveals clear canals, tympanic membranes are intact bilaterally without bulging, retraction, inflammation or discharge. Hearing is grossly normal bilaterally. Nose:  no external deformity, no nasal discharge, and no mucosal edema but is erythematous. no external deformity, no nasal discharge, no mucosal edema, and no sinus percussion tenderness.   Mouth:  Oral mucosa and oropharynx without lesions or exudates.  Teeth in good repair. Neck:  No deformities, masses, or tenderness noted. Cervical Nodes:  No lymphadenopathy noted Psych:  Cognition and judgment appear intact. Alert and cooperative with normal attention span and concentration. 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  Problem # 2:  ALLERGIC RHINITIS (ICD-477.9) Assessment: Deteriorated  Her updated medication list for this problem includes:    Fluticasone Propionate 50 Mcg/act Susp (Fluticasone propionate) ..... Use 2 sprays each nostril once daily  Orders: Depo- Medrol 80mg  (J1040) Admin of Therapeutic Inj  intramuscular  or subcutaneous (16109)  Complete Medication List: 1)  Metoprolol Succinate 25 Mg Xr24h-tab (Metoprolol succinate) .... Take 1 daily 2)  Aspirin 325 Mg Tabs (Aspirin) .... Take 1 daily 3)  Plavix 75 Mg Tabs (Clopidogrel bisulfate) .... Take 1 daily 4)  Simvastatin 40 Mg Tabs (Simvastatin) .... Take 1 at  bedtime 5)  Nitrostat 0.4 Mg Subl (Nitroglycerin) .... As directed 6)  Nicotine 21 Mg/24hr Pt24 (Nicotine) .... Use 1 daily 7)  Alprazolam 0.25 Mg Tabs (Alprazolam) .... Take 1 every 8 hrs as needed for anxiety 8)  Fluticasone Propionate 50 Mcg/act Susp (Fluticasone propionate) .... Use 2 sprays each nostril once daily  Patient Instructions: 1)  Please schedule a follow-up appointment in 3 months. 2)  You have received Depo Medrol today. 3)  Continue taking an allergy pill.  You may still use Allegra, or you may try Zyrtec or Claritin. 4)  I have also prescribed an allery nasal spray to use once daily. Prescriptions: FLUTICASONE PROPIONATE 50 MCG/ACT SUSP (FLUTICASONE PROPIONATE) use 2 sprays each nostril once daily  #1 x 1   Entered and Authorized by:   Esperanza Sheets PA   Signed by:   Esperanza Sheets PA on 02/13/2010   Method used:   Print then Give to Patient   RxID:   [id number]    Medication Administration  Injection # 1:    Medication: Depo- Medrol 80mg     Diagnosis: ALLERGIC RHINITIS (ICD-477.9)    Route: IM    Site: LUOQ gluteus    Exp Date: 12/11    Lot #: [id number]    Mfr: Pharm[employer]    Patient tolerated injection without complications    Given by: Adella Hare LPN (February 13, 2010 5:20 PM)  Orders Added: 1)  Depo- Medrol 80mg  [J1040] 2)  Admin of Therapeutic Inj  intramuscular or subcutaneous [96372] 3)  Est. Patient Level III [95621]

## 2010-08-28 NOTE — Letter (Signed)
Summary: ent  ent   Imported By: Lind Guest 05/23/2010 14:20:49  _____________________________________________________________________  External Attachment:    Type:   Image     Comment:   External Document

## 2010-08-28 NOTE — Assessment & Plan Note (Signed)
Summary: office visit   Vital Signs:  Patient profile:   55 year old female Height:      61.5 inches Weight:      123.50 pounds BMI:     23.04 O2 Sat:      98 % on Room air Pulse rate:   82 / minute BP sitting:   124 / 72  (right arm) CC: ears are still bothering her, and her glands on her throat are tendor.  Room 3 Is Patient Diabetic? No   Primary Samaria Anes:  Esperanza Sheets PA  CC:  ears are still bothering her and and her glands on her throat are tendor.  Room 3.  History of Present Illness: Pt presents today with c/o increased chest congestion, cough with foul tasting phlegm, nasal congestion and post nasal drainage x 4-5 days.  No fever or chills. Continues to have problems with Rt ear feeling like it has fluid in it.  Sounds like hearing through a seashell.  No ear pain. Is still smoking 5-6 cigs per day.   Allergies: 1)  ! Trilipix (Choline Fenofibrate) 2)  ! * Omnicef (Cefdinir) 3)  ! Percocet  Past History:  Past medical history reviewed for relevance to current acute and chronic problems.  Past Medical History: Reviewed history from 01/16/2010 and no changes required. Current Problems:  ANXIETY DEPRESSION (ICD-300.4) HYPERLIPIDEMIA (ICD-272.4) Family Hx of COLON CANCER (ICD-153.9) TOBACCO ABUSE (ICD-305.1) ALLERGIC RHINITIS (ICD-477.9) IBS (ICD-564.1) MITRAL VALVE PROLAPSE (ICD-424.0) Coronary artery disease Hypertension PMH reviewed for relevance  Family History: Father:deceased, , 65 CAD and Massive MI Mother: deceased, 3 colon cancer - dx at age 29 Siblings: 1 brother - age 64 -HTN                1 sister - age 21 -blood clots in neck, Leukemia, CAD 2 Children: 81, Thyroid problems                    20, deceased  Review of Systems General:  Complains of fatigue; denies chills and fever. ENT:  Complains of nasal congestion, postnasal drainage, and sore throat; denies earache and sinus pressure. CV:  Denies chest pain or discomfort and  palpitations. Resp:  Complains of cough and sputum productive; denies shortness of breath and wheezing.  Physical Exam  General:  Well-developed,well-nourished,in no acute distress; alert,appropriate and cooperative throughout examination Head:  Normocephalic and atraumatic without obvious abnormalities. No apparent alopecia or balding. Ears:  External ear exam shows no significant lesions or deformities.  Otoscopic examination reveals clear canals, tympanic membranes are intact bilaterally without bulging, retraction, inflammation or discharge. Hearing is grossly normal bilaterally. Nose:  External nasal examination shows no deformity or inflammation. Nasal mucosa are pink and moist without lesions or exudates. Mouth:  Oral mucosa and oropharynx without lesions or exudates.  Teeth in good repair. Neck:  No deformities, masses, or tenderness noted. Lungs:  Normal respiratory effort, chest expands symmetrically. Lungs are clear to auscultation, no crackles or wheezes. Heart:  Normal rate and regular rhythm. S1 and S2 normal without gallop, murmur, click, rub or other extra sounds. Skin:  nevus Lt forehead approx 5 mm, dk brown/black, per pt no change size or color x yrs. Cervical Nodes:  No lymphadenopathy noted Psych:  Cognition and judgment appear intact. Alert and cooperative with normal attention span and concentration. No apparent delusions, illusions, hallucinations   Impression & Recommendations:  Problem # 1:  BRONCHITIS, ACUTE (ICD-466.0) Assessment New  Her updated medication list  for this problem includes:    Doxycycline Hyclate 100 Mg Caps (Doxycycline hyclate) .Marland Kitchen... Take 1 two times a day x 10 days  Problem # 2:  EUSTACHIAN TUBE DYSFUNCTION (ICD-381.81) Assessment: Unchanged  Orders: ENT Referral (ENT)  Problem # 3:  NEVUS (ICD-216.9) Assessment: Comment Only Discussed removal.  Pt declined to have done today & states she will schedule to have done in the near  future.  Complete Medication List: 1)  Metoprolol Succinate 25 Mg Xr24h-tab (Metoprolol succinate) .... Take 1 daily 2)  Aspirin 325 Mg Tabs (Aspirin) .... Take 1 daily 3)  Plavix 75 Mg Tabs (Clopidogrel bisulfate) .... Take 1 daily 4)  Simvastatin 40 Mg Tabs (Simvastatin) .... Take 1 at bedtime 5)  Nitrostat 0.4 Mg Subl (Nitroglycerin) .... As directed 6)  Nicotine 21 Mg/24hr Pt24 (Nicotine) .... Use 1 daily 7)  Alprazolam 0.25 Mg Tabs (Alprazolam) .... Take 1 every 8 hrs as needed for anxiety 8)  Fluticasone Propionate 50 Mcg/act Susp (Fluticasone propionate) .... Use 2 sprays each nostril once daily 9)  Doxycycline Hyclate 100 Mg Caps (Doxycycline hyclate) .... Take 1 two times a day x 10 days  Patient Instructions: 1)  Keep your next appt in October 2)  I have prescribed an antibiotic for your chest congestion. 3)  I am referring you to an ENT dr for your ear. 4)  Tobacco is very bad for your health and your loved ones! You Should stop smoking!. 5)  Stop Smoking Tips: Choose a Quit date. Cut down before the Quit date. decide what you will do as a substitute when you feel the urge to smoke(gum,toothpick,exercise). Prescriptions: DOXYCYCLINE HYCLATE 100 MG CAPS (DOXYCYCLINE HYCLATE) take 1 two times a day x 10 days  #20 x 0   Entered and Authorized by:   Esperanza Sheets PA   Signed by:   Esperanza Sheets PA on 04/08/2010   Method used:   Print then Give to Patient   RxID:   [id number]

## 2010-08-28 NOTE — Letter (Signed)
Summary: Work Excuse  [facility]  [address]   [place], [place] [address]   Phone: [phone]  Fax: [phone]    Today's Date: Dec 09, 2009  Name of Patient: Gabriella Love  The above named patient had a medical visit today at:   9: 45am .  Please take this into consideration when reviewing the time away from work/school.    Special Instructions:  [  ] None  [  ] To be off the remainder of today, returning to the normal work / school schedule tomorrow.  [  ] To be off until the next scheduled appointment on ______________________.  [ X ] Other   Off work the remainder of this week due to Acute Pneumonia.  Anticipated return to work on Monday 12-16-09.  Sincerely yours,   Esperanza Sheets PA

## 2010-08-28 NOTE — Progress Notes (Signed)
Summary: DOING BETTER  Phone Note Call from Patient   Summary of Call: RECEIVED YOUR CALL AND GLAD THAT YOU HAD CALLED HER AND WANTED  YOU TO KNOW THAT HER HEART DR PUT HER TO WORKING 1/2 DAYS THIS WEEK  AND IT WORE HER OUT BUT SHE IS DOING MUCH BETTER Initial call taken by: Lind Guest,  January 06, 2010 4:07 PM  Follow-up for Phone Call        Tell her thank you for the update.  I'm glad that she is doing some better.  I know that cardiology will be following her, but I'd like her to make an appt in 1mos here. Follow-up by: Esperanza Sheets PA,  January 07, 2010 8:31 AM  Additional Follow-up for Phone Call Additional follow up Details #1::        LEFT MESSAGE Additional Follow-up by: Lind Guest,  January 07, 2010 4:33 PM    Additional Follow-up for Phone Call Additional follow up Details #2::    APPT. 7.21.11 @ 4:15 Follow-up by: Lind Guest,  January 08, 2010 1:26 PM

## 2010-08-28 NOTE — Letter (Signed)
Summary: Inital disability claim  Inital disability claim   Imported By: Rudene Anda 12/19/2009 16:18:47  _____________________________________________________________________  External Attachment:    Type:   Image     Comment:   External Document

## 2010-08-28 NOTE — Assessment & Plan Note (Signed)
Summary: office visit   Vital Signs:  Patient profile:   55 year old female Height:      61.5 inches Weight:      122.25 pounds BMI:     22.81 O2 Sat:      97 % Pulse rate:   90 / minute BP sitting:   120 / 78  (left arm) CC: can breath better but still no energy, still has a cough she is still taking medications  results from chest xray room 2 Is Patient Diabetic? No   Primary Provider:  Esperanza Sheets PA  CC:  can breath better but still no energy and still has a cough she is still taking medications  results from chest xray room 2.  History of Present Illness: See 12-09-09 OV.  Pt states she is feeling 50-60 % better.  Her cough is less, but is now sometimes productive and she feels like her congestion is breaking up and improving.  She still coughs more when she lies down so she is sleeping in a recliner still.  Her chest is not huring like it was, and she is not wheezing.  No more fever.  She still doesn't have much energy though.   Allergies: 1)  ! Trilipix (Choline Fenofibrate) 2)  ! * Omnicef (Cefdinir)  Review of Systems General:  Denies chills and fever. ENT:  Denies earache, nasal congestion, and sore throat. Resp:  Complains of cough and sputum productive; denies shortness of breath and wheezing.  Physical Exam  General:  Well-developed,well-nourished,in no acute distress; alert,appropriate and cooperative throughout examination Head:  Normocephalic and atraumatic without obvious abnormalities. No apparent alopecia or balding. Ears:  External ear exam shows no significant lesions or deformities.  Otoscopic examination reveals clear canals, tympanic membranes are intact bilaterally without bulging, retraction, inflammation or discharge. Hearing is grossly normal bilaterally. Nose:  External nasal examination shows no deformity or inflammation. Nasal mucosa are pink and moist without lesions or exudates. Mouth:  Oral mucosa and oropharynx without lesions or exudates.     Neck:  No deformities, masses, or tenderness noted. Lungs:  normal respiratory effort and no wheezes.  few rales still in LLL but much better than prev visit Cervical Nodes:  No lymphadenopathy noted Psych:  Cognition and judgment appear intact. Alert and cooperative with normal attention span and concentration. No apparent delusions, illusions, hallucinations   Impression & Recommendations:  Problem # 1:  PNEUMONIA, LEFT LOWER LOBE (ICD-481) Assessment Improved  Her updated medication list for this problem includes:    Doxycycline Hyclate 100 Mg Caps (Doxycycline hyclate) .Marland Kitchen... Take 1 two times a day for 10 days  Problem # 2:  TOBACCO ABUSE (ICD-305.1) Assessment: Comment Only  Encouraged smoking cessation and discussed different methods for smoking cessation.   Complete Medication List: 1)  Doxycycline Hyclate 100 Mg Caps (Doxycycline hyclate) .... Take 1 two times a day for 10 days 2)  Promethazine-codeine 6.25-10 Mg/64ml Syrp (Promethazine-codeine) .... Take 1 - 2 tsp every 6 hrs as needed for cough 3)  Ventolin Hfa 108 (90 Base) Mcg/act Aers (Albuterol sulfate) .... Use 2 puffs every 4 hrs  Patient Instructions: 1)  Please schedule a follow-up appointment as needed.  If you still have cough and congestion at the end of your antibiotic I will need to see you again in the office. 2)  It will take some time for your energy and stamina to improve.   3)  Increase fluids and rest. 4)  Restart Mucinex as discussed.  5)  If you decide to you want a prescription of Chantix let me know.

## 2010-08-28 NOTE — Miscellaneous (Signed)
Summary: cardio- pulmonary  cardio- pulmonary   Imported By: Lind Guest 03/18/2010 08:16:17  _____________________________________________________________________  External Attachment:    Type:   Image     Comment:   External Document

## 2010-08-28 NOTE — Cardiovascular Report (Signed)
Summary: Cardiac Cath Other  Cardiac Cath Other   Imported By: Lind Guest 12/31/2009 14:46:20  _____________________________________________________________________  External Attachment:    Type:   Image     Comment:   External Document

## 2010-08-28 NOTE — Letter (Signed)
Summary: Out of Work  [facility]  [address]   [place], [place] [address]   Phone: [phone]  Fax: [phone]    Dec 02, 2009   Employee:  AVON MOLOCK    To Whom It May Concern:   For Medical reasons, please excuse the above named employee from work for the following dates:  Start:   11/28/2009  End:   12/05/2009 To return with no restricitons  If you need additional information, please feel free to contact our office.         Sincerely,    Esperanza Sheets, [place]

## 2010-08-28 NOTE — Assessment & Plan Note (Signed)
Summary: leg pain- room 1   Vital Signs:  Patient profile:   55 year old female Height:      61.5 inches Weight:      118.50 pounds BMI:     22.11 O2 Sat:      99 % on Room air Pulse rate:   90 / minute Resp:     16 per minute BP sitting:   120 / 74  (left arm)  Vitals Entered By: Adella Hare LPN (January 16, 2010 9:43 AM) CC: left leg pain, (calf was swollen with red streak) Is Patient Diabetic? No Pain Assessment Patient in pain? yes     Location: left leg Intensity: 2 Type: aching Onset of pain  Intermittent Comments did not bring meds to ov   Primary Provider:  Esperanza Sheets PA  CC:  left leg pain and (calf was swollen with red streak).  History of Present Illness: Pt states she woke up this morning and noticed a fine white line on the front of her L lower leg and at the bottom of the white line was a swollen purple knot.  Little bit later the purple knot was gone and she noticed a purple streak across her leg. And the white line became a red line.  She had some aching when she first woke up this morning and started walking  in her Lt calf but that has resolved. No swelling in her legs. She did get a splinter in her Lt posterior lower leg yesterday that she removed.  Also has been bruising easily since on Plavix.  CAD, recent stent.  Is seeing cardiologist regularly.  Is using the Nicotine Patch & is quitting smoking.  She has cheated a couple times during stressful situations.  Her 61 yo grandson is in the hospital and this has been very upsetting for her.  She has a old prescription of Paxil at home.  In the past she would only use this as needed,taking 1 pill for 1-2 days then discontinuing it.  She feels like she needs something to help with her nerves during these stressful times.  But doesnt feel like she needs something every day.    Allergies: 1)  ! Trilipix (Choline Fenofibrate) 2)  ! * Omnicef (Cefdinir) 3)  ! Percocet  Past History:  Past medical, surgical,  family and social histories (including risk factors) reviewed, and no changes noted (except as noted below).  Past Medical History: Current Problems:  ANXIETY DEPRESSION (ICD-300.4) HYPERLIPIDEMIA (ICD-272.4) Family Hx of COLON CANCER (ICD-153.9) TOBACCO ABUSE (ICD-305.1) ALLERGIC RHINITIS (ICD-477.9) IBS (ICD-564.1) MITRAL VALVE PROLAPSE (ICD-424.0) Coronary artery disease Hypertension  Past Surgical History: Cholecystectomy - ? 1992 - gallstones Cardiac Stent 11/2009  Family History: Reviewed history from 11/08/2008 and no changes required. Father:deceased, , 49 CAD and Massive MI Mother: deceased, 54 colon cancer - dx at age 29 Siblings: 1 brother - age 32 - healthy                1 sister - age 93 - "sickly" - blood clots in neck 2 Children: 74, Thyroid problems                    20, deceased  Social History: Reviewed history from 11/08/2008 and no changes required. 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 CV:  Denies chest pain or discomfort. Resp:  Denies cough and shortness of breath. MS:  Denies joint pain, joint swelling, cramps, and stiffness. Derm:  See HPI.  Physical Exam  General:  Well-developed,well-nourished,in no acute distress; alert,appropriate and cooperative throughout examination Head:  Normocephalic and atraumatic without obvious abnormalities. No apparent alopecia or balding. Lungs:  Normal respiratory effort, chest expands symmetrically. Lungs are clear to auscultation, no crackles or wheezes. Heart:  Normal rate and regular rhythm. S1 and S2 normal without gallop, murmur, click, rub or other extra sounds. Msk:  LLE: normal ROM, no joint  tenderness, no joint swelling, and no redness over joints.   Pulses:  L posterior tibial normal and L dorsalis pedis normal.   Extremities:  LLE:  No edema, no calf tenderness and neg Homans. Neurologic:  alert & oriented X3, strength normal in all extremities, sensation intact to light touch, and gait normal.   Skin:  Lt anterior lower leg/shin:  fine linear erythmatous line approx 1 cm. Inferior to this and running diagonally laterally and inferiorly purple superficial eccymosis which is TTP.  There is some palpable swelling along the eccymosis, question of swelling from bruising or if phlebitis. Posterior Lt lower leg inferiorly scab noted with approx 1 cm surrounding eccymosis where pt reports sliver yesterday.  No erythema. Pt also has other areas of eccymosis noted on UE and LE. Psych:  Cognition and judgment appear intact. Alert and cooperative with normal attention span and concentration. No apparent delusions, illusions, hallucinations   Impression & Recommendations:  Problem # 1:  ECCHYMOSES, SPONTANEOUS (ICD-782.7) Assessment New Discussed with pt that this is due to her Plavix. Discussed that the area on her Lt lower leg appears to have been a superficial scratch that resulted in bruising.  It is possible that this may be a superficial phlebitis but appears on exam to be more of bruising. Discussed with pt that she did not have syptoms of a blood clot. To elevate leg today and use ice to the area.  May also take aleve two times a day as needed. To monitor for redness, warmth to touch or swelling of her lower leg.  Problem # 2:  CORONARY ARTERY DISEASE (ICD-414.00) Assessment: Comment Only  Her updated medication list for this problem includes:    Metoprolol Succinate 25 Mg Xr24h-tab (Metoprolol succinate) .Marland Kitchen... Take 1 daily    Aspirin 325 Mg Tabs (Aspirin) .Marland Kitchen... Take 1 daily    Plavix 75 Mg Tabs (Clopidogrel bisulfate) .Marland Kitchen... Take 1 daily    Nitrostat 0.4 Mg Subl  (Nitroglycerin) .Marland Kitchen... As directed  Problem # 3:  HYPERTENSION (ICD-401.9) Assessment: Comment Only  Her updated medication list for this problem includes:    Metoprolol Succinate 25 Mg Xr24h-tab (Metoprolol succinate) .Marland Kitchen... Take 1 daily  BP today: 120/74 Prior BP: 120/78 (12/12/2009)  Prior 10 Yr Risk Heart Disease: 11 % (12/20/2008)  Labs Reviewed: K+: 4.2 (11/09/2008) Creat: : 0.76 (11/09/2008)   Chol: 234 (11/09/2008)   HDL: 36 (11/09/2008)   LDL: 146 (11/09/2008)   TG: 259 (11/09/2008)  Problem # 4:  ANXIETY DEPRESSION (ICD-300.4) Assessment: Deteriorated  Complete Medication List: 1)  Metoprolol Succinate 25 Mg Xr24h-tab (Metoprolol succinate) .... Take 1 daily 2)  Aspirin 325 Mg Tabs (Aspirin) .... Take 1 daily 3)  Plavix 75 Mg Tabs (Clopidogrel bisulfate) .... Take 1 daily 4)  Simvastatin 40 Mg  Tabs (Simvastatin) .... Take 1 at bedtime 5)  Nitrostat 0.4 Mg Subl (Nitroglycerin) .... As directed 6)  Nicotine 21 Mg/24hr Pt24 (Nicotine) .... Use 1 daily 7)  Alprazolam 0.25 Mg Tabs (Alprazolam) .... Take 1 every 8 hrs as needed for anxiety  Patient Instructions: 1)  Please schedule a follow-up appointment in 2 months. 2)  CONGRATULATIONS ON NOT SMOKING!  KEEP UP THE GOOD WORK! 3)  I have prescribed Xanax for you to use as needed for anxiety. 4)  Elevate your leg today and put ice on it. 5)  Take Aleve two times a day. Prescriptions: ALPRAZOLAM 0.25 MG TABS (ALPRAZOLAM) take 1 every 8 hrs as needed for anxiety  #30 x 0   Entered and Authorized by:   Esperanza Sheets PA   Signed by:   Esperanza Sheets PA on 01/16/2010   Method used:   Printed then faxed to ...       [facility]  [address] [phone]* (retail)       [address]       [place], [place]  [address]       Ph: [phone] or [phone]       Fax: [phone]   RxID:   [id number]

## 2010-08-28 NOTE — Assessment & Plan Note (Signed)
Summary: NEW PATIENT- room 3   Vital Signs:  Patient profile:   55 year old female Height:      61.5 inches Weight:      123.25 pounds BMI:     22.99 O2 Sat:      97 % on Room air Pulse rate:   93 / minute Resp:     16 per minute BP sitting:   140 / 90  (left arm)  Vitals Entered By: Adella Hare LPN (October 14, 2009 2:48 PM)  Serial Vital Signs/Assessments:  Time      Position  BP       Pulse  Resp  Temp     By                     164/88                         Esperanza Sheets PA  CC: new patient/ cold Is Patient Diabetic? No Pain Assessment Patient in pain? no      Comments currently not on meds, they have ran out   Primary Provider:  Franchot Heidelberg, MD  CC:  new patient/ cold.  History of Present Illness: New pt.  Presents today with c/o sinus infectin that started 2 wks ago.  Her syptoms have progressed.  She now has a nonprod cough too. Fri temp 101, yest 100.1.  Throat feels sore too.  She has been taking some otc  cold meds but hasn't had any today.  She has a hx of BP elevation.  No previous blood pressure meds. Also hx of hyperlipidemia.  Pt see's GYN for paps, etc.  States she is UTD.    Current Medications (verified): 1)  Paxil 20 Mg  Tabs (Paroxetine Hcl) .... One Nightly 2)  Alahist Dm 7.11-27-13 Mg/27ml Liqd (Phenylephrine-Bromphen-Dm) .... One Tsp By Mouth Every 6 Hours As Needed 3)  Niaspan 500 Mg Cr-Tabs (Niacin (Antihyperlipidemic)) .... One At Bedtime 4)  Prevacid 30 Mg Cpdr (Lansoprazole) .... Take 1 Tablet By Mouth Once A Day As Needed  Allergies (verified): 1)  ! Trilipix (Choline Fenofibrate)  Past History:  Past medical history reviewed for relevance to current acute and chronic problems. Past surgical history reviewed for relevance to current acute and chronic problems. Family history reviewed for relevance to current acute and chronic problems.  Past Medical History: Reviewed history from 06/25/2008 and no changes required. Current  Problems:  ANXIETY DEPRESSION (ICD-300.4) HYPERLIPIDEMIA (ICD-272.4) ELEVATED BLOOD PRESSURE WITHOUT DIAGNOSIS OF HYPERTENSION (ICD-796.2) Family Hx of COLON CANCER (ICD-153.9) TOBACCO ABUSE (ICD-305.1) BRONCHITIS, ACUTE (ICD-466.0) ALLERGIC RHINITIS (ICD-477.9) IBS (ICD-564.1) MITRAL VALVE PROLAPSE (ICD-424.0)  Past Surgical History: Reviewed history from 12/02/2007 and no changes required. Cholecystectomy - ? 31 - gallstones  Family History: Reviewed history from 11/08/2008 and no changes required. Father:deceased, , 49 CAD and Massive MI Mother: deceased, 41 colon cancer - dx at age 43 Siblings: 1 brother - age 82 - healthy                1 sister - age 84 - "sickly" - blood clots in neck 2 Children: 58, Thyroid problems                    20, deceased  Review of Systems General:  Complains of chills and fever. ENT:  Complains of nasal congestion, postnasal drainage, sinus pressure, and sore throat; denies earache. CV:  Denies chest pain or  discomfort. Resp:  Complains of cough; denies shortness of breath and sputum productive.  Physical Exam  General:  Well-developed,well-nourished,in no acute distress; alert,appropriate and cooperative throughout examination Head:  Normocephalic and atraumatic without obvious abnormalities. No apparent alopecia or balding. Ears:  External ear exam shows no significant lesions or deformities.  Otoscopic examination reveals clear canals, tympanic membranes are intact bilaterally without bulging, retraction, inflammation or discharge. Hearing is grossly normal bilaterally. Nose:  no external deformity, mucosal erythema, mucosal edema, L frontal sinus tenderness, and R frontal sinus tenderness.   Mouth:  Oral mucosa and oropharynx without lesions or exudates.  Neck:  No deformities, masses, or tenderness noted. Lungs:  Normal respiratory effort, chest expands symmetrically. Lungs are clear to auscultation, no crackles or wheezes. Heart:   Normal rate and regular rhythm. S1 and S2 normal without gallop, murmur, click, rub or other extra sounds. Cervical Nodes:  No lymphadenopathy noted Psych:  Cognition and judgment appear intact. Alert and cooperative with normal attention span and concentration. No apparent delusions, illusions, hallucinations   Impression & Recommendations:  Problem # 1:  SINUSITIS, ACUTE (ICD-461.9) Assessment New  Her updated medication list for this problem includes:    Alahist Dm 7.11-27-13 Mg/61ml Liqd (Phenylephrine-bromphen-dm) ..... One tsp by mouth every 6 hours as needed    Cefdinir 300 Mg Caps (Cefdinir) .Marland Kitchen... Take 2 caps once daily x 10 daily  Problem # 2:  ELEVATED BLOOD PRESSURE WITHOUT DIAGNOSIS OF HYPERTENSION (ICD-796.2) Advised pt she could take Mucinex DM as needed.  To d/c other otc cold meds because can elevate BP. Orders: T-Comprehensive Metabolic Panel ([id number])  Problem # 3:  HYPERLIPIDEMIA (ICD-272.4)  Her updated medication list for this problem includes:    Niaspan 500 Mg Cr-tabs (Niacin (antihyperlipidemic)) ..... One at bedtime  Orders: T-Comprehensive Metabolic Panel [id number]) T-Lipid Profile [id number])  Problem # 4:  TOBACCO ABUSE (ICD-305.1) Assessment: Unchanged Encouraged pt to discontinue  Complete Medication List: 1)  Paxil 20 Mg Tabs (Paroxetine hcl) .... One nightly 2)  Alahist Dm 7.11-27-13 Mg/3ml Liqd (Phenylephrine-bromphen-dm) .... One tsp by mouth every 6 hours as needed 3)  Niaspan 500 Mg Cr-tabs (Niacin (antihyperlipidemic)) .... One at bedtime 4)  Prevacid 30 Mg Cpdr (Lansoprazole) .... Take 1 tablet by mouth once a day as needed 5)  Cefdinir 300 Mg Caps (Cefdinir) .... Take 2 caps once daily x 10 daily  Other Orders: T-TSH ([id number]) T-CBC No Diff ([id number]) T-Vitamin D (25-Hydroxy) ([id number])  Patient Instructions: 1)  Please schedule a follow-up appointment in 1 month. 2)  Tobacco is very bad for your health and your loved  ones! You Should stop smoking!. 3)  Stop Smoking Tips: Choose a Quit date. Cut down before the Quit date. decide what you will do as a substitute when you feel the urge to smoke(gum,toothpick,exercise). 4)  You may use Mucinex DM as needed for cough and congestion. 5)  I have ordered labs for you to have drawn fasting. Prescriptions: CEFDINIR 300 MG CAPS (CEFDINIR) take 2 caps once daily x 10 daily  #20 x 0   Entered and Authorized by:   Esperanza Sheets PA   Signed by:   Adella Hare LPN on 09/32/3557   Method used:   [facility]ronically to        [facility] [phone]* (retail)       [address]       [place], [place]  [address]  Ph: [phone] or [phone]       Fax: [phone]   RxID:   [id number]

## 2010-08-28 NOTE — Progress Notes (Signed)
Phone Note Call from Patient   Summary of Call: Patient wanted you to know that she will take her last doxycycline today and she is still having congestion in her chest and wheezing. Did you want to give her something else or do you want to see her back?  Call at work [phone]  Initial call taken by: Everitt Amber LPN,  Dec 18, 2009 9:55 AM  Follow-up for Phone Call        I have prescribed Symbicort inhaler, prednisone, and extended her Doxycycline. She can still use her albuterol inhaler as needed. Continue mucinex two times a day. No smoking. Follow up appt next week. Follow-up by: Esperanza Sheets PA,  Dec 18, 2009 10:37 AM  Additional Follow-up for Phone Call Additional follow up Details #1::        patient aware Additional Follow-up by: Adella Hare LPN,  Dec 18, 2009 5:02 PM    New/Updated Medications: SYMBICORT 160-4.5 MCG/ACT AERO (BUDESONIDE-FORMOTEROL FUMARATE) use 2 puffs two times a day. rinse mouth after use. PREDNISONE 10 MG TABS (PREDNISONE) take 4 tabs daily x 3 days, then 3 tabs daily x 3 days, then 2 tabs daily x 2 days, then 1 tab daily x 2 days, then 1/2 tab daily x 2 days DOXYCYCLINE HYCLATE 100 MG CAPS (DOXYCYCLINE HYCLATE) take 1 two times a day x 5 days Prescriptions: DOXYCYCLINE HYCLATE 100 MG CAPS (DOXYCYCLINE HYCLATE) take 1 two times a day x 5 days  #10 x 0   Entered and Authorized by:   Esperanza Sheets PA   Signed by:   Esperanza Sheets PA on 12/18/2009   Method used:   [facility]ronically to        [facility] [phone]* (retail)       [address]       [place], [place]  [address]       Ph: [phone] or [phone]       Fax: [phone]   RxID:   [id number] PREDNISONE 10 MG TABS (PREDNISONE) take 4 tabs daily x 3 days, then 3 tabs daily x 3 days, then 2 tabs daily x 2 days, then 1 tab daily x 2 days, then 1/2 tab daily x 2 days  #28 x 0   Entered and Authorized by:   Esperanza Sheets PA   Signed by:   Esperanza Sheets PA on 12/18/2009   Method  used:   Electronically to        [facility]. [phone]* (retail)       [address]       [place], [place]  [address]       Ph: [phone] or [phone]       Fax: [phone]   RxID:   [id number] SYMBICORT 160-4.5 MCG/ACT AERO (BUDESONIDE-FORMOTEROL FUMARATE) use 2 puffs two times a day. rinse mouth after use.  #1 x 1   Entered and Authorized by:   Esperanza Sheets PA   Signed by:   Esperanza Sheets PA on 12/18/2009   Method used:   Electronically to        [facility]. [phone]* (retail)       [address]       [place], [place]  [address]       Ph: [phone] or [phone]       Fax: [phone]   RxID:  [id number]  

## 2010-08-28 NOTE — Progress Notes (Signed)
Summary: medicine  Phone Note Call from Patient   Summary of Call: the medicine she recieved has tore up her stomach bad with diah. she had to leave work yesterday because of this and she did not take any last night and she wants to know can you change the medicine or what please call back at cell # [phone] Initial call taken by: Lind Guest,  October 17, 2009 9:13 AM  Follow-up for Phone Call        Prescription changed.  Follow-up by: Esperanza Sheets PA,  October 17, 2009 9:54 AM  Additional Follow-up for Phone Call Additional follow up Details #1::        called patient, left message Additional Follow-up by: Adella Hare LPN,  October 17, 2009 1:55 PM   New Allergies: ! * OMNICEF (CEFDINIR) Additional Follow-up for Phone Call Additional follow up Details #2::    PATIENT AWARE Follow-up by: Lind Guest,  October 17, 2009 2:05 PM  New/Updated Medications: AZITHROMYCIN 250 MG TABS (AZITHROMYCIN) take 2 today, then 1 daily x 4 days New Allergies: ! * OMNICEF (CEFDINIR)Prescriptions: AZITHROMYCIN 250 MG TABS (AZITHROMYCIN) take 2 today, then 1 daily x 4 days  #6 x 0   Entered and Authorized by:   Esperanza Sheets PA   Signed by:   Esperanza Sheets PA on 10/17/2009   Method used:   Electronically to        [facility]. [phone]* (retail)       [address]       [place], [place]  [address]       Ph: [phone] or [phone]       Fax: [phone]   RxID:   [id number]

## 2010-08-28 NOTE — Assessment & Plan Note (Signed)
Summary: office visit Room 2   Vital Signs:  Patient profile:   55 year old female Height:      61.5 inches Weight:      126.25 pounds BMI:     23.55 O2 Sat:      98 % on Room air Pulse rate:   93 / minute Resp:     16 per minute BP sitting:   116 / 76  (left arm)  Vitals Entered By: Mauricia Area CMA (May 14, 2010 3:38 PM) CC: Still having congestion in chest, she was fine while taking meds, but when she ran out, the feeling came back.   Primary Provider:  Esperanza Sheets PA  CC:  Still having congestion in chest, she was fine while taking meds, but when she ran out, and the feeling came back..  History of Present Illness: Pt states she improved with Zithromax prescription.  But after finished it her cough, chest congestion,and tightness, as well as low grade fevers came back.  No sinus pressure. she is still smoking though is trying to quit. No prev PFT.  Has had recurrent respiratory/bronchitis infections.  CXRs neg. Pt did not call cardiologist for f/u.  "My symptoms completely went away."  Had her f/u with Dr Gearldine Bienenstock.  Ear effusion is improving.  Will continue to monitor and treat with nasal steroid.   Current Medications (verified): 1)  Metoprolol Succinate 25 Mg Xr24h-Tab (Metoprolol Succinate) .... Take 1 Daily 2)  Aspirin 325 Mg Tabs (Aspirin) .... Take 1 Daily 3)  Plavix 75 Mg Tabs (Clopidogrel Bisulfate) .... Take 1 Daily 4)  Simvastatin 40 Mg Tabs (Simvastatin) .... Take 1 At Bedtime 5)  Nitrostat 0.4 Mg Subl (Nitroglycerin) .... As Directed 6)  Alprazolam 0.25 Mg Tabs (Alprazolam) .... Take 1 Every 8 Hrs As Needed For Anxiety 7)  Fluticasone Propionate 50 Mcg/act Susp (Fluticasone Propionate) .... Use 2 Sprays Each Nostril Once Daily  Allergies (verified): 1)  ! Trilipix (Choline Fenofibrate) 2)  ! * Omnicef (Cefdinir) 3)  ! Percocet  Past History:  Past medical history reviewed for relevance to current acute and chronic problems.  Past Medical  History: Reviewed history from 01/16/2010 and no changes required. Current Problems:  ANXIETY DEPRESSION (ICD-300.4) HYPERLIPIDEMIA (ICD-272.4) Family Hx of COLON CANCER (ICD-153.9) TOBACCO ABUSE (ICD-305.1) ALLERGIC RHINITIS (ICD-477.9) IBS (ICD-564.1) MITRAL VALVE PROLAPSE (ICD-424.0) Coronary artery disease Hypertension  Review of Systems General:  Complains of chills and fever. ENT:  Complains of nasal congestion; denies earache, sinus pressure, and sore throat. CV:  Complains of chest pain or discomfort; denies difficulty breathing while lying down, palpitations, and swelling of feet. Resp:  Complains of cough; denies shortness of breath, sputum productive, and wheezing.  Physical Exam  General:  Well-developed,well-nourished,in no acute distress; alert,appropriate and cooperative throughout examination Head:  Normocephalic and atraumatic without obvious abnormalities. No apparent alopecia or balding. Ears:  External ear exam shows no significant lesions or deformities.  Otoscopic examination reveals clear canals, tympanic membranes are intact bilaterally without bulging, retraction, inflammation or discharge. Hearing is grossly normal bilaterally. Nose:  External nasal examination shows no deformity or inflammation. Nasal mucosa are pink and moist without lesions or exudates. Mouth:  Oral mucosa and oropharynx without lesions or exudates.   Neck:  No deformities, masses, or tenderness noted. Lungs:  normal respiratory effort.  No wheeze, few fine rales noted bilat bases. Heart:  Normal rate and regular rhythm. S1 and S2 normal without gallop, murmur, click, rub or other extra sounds. Cervical Nodes:  No lymphadenopathy noted Psych:  Cognition and judgment appear intact. Alert and cooperative with normal attention span and concentration. No apparent delusions, illusions, hallucinations   Impression & Recommendations:  Problem # 1:  BRONCHITIS, CHRONIC (ICD-491.9) Assessment  New Discussed with pt that I suspect she has some COPD from smoking that is causing her recurrent respiratory infections.  Strongly encouraged her to quit smoking.  Restart Symbicort.  Orders: PFT Baseline-Pre/Post Bronchodiolator (PFT Baseline-Pre/Pos)  Complete Medication List: 1)  Metoprolol Succinate 25 Mg Xr24h-tab (Metoprolol succinate) .... Take 1 daily 2)  Aspirin 325 Mg Tabs (Aspirin) .... Take 1 daily 3)  Plavix 75 Mg Tabs (Clopidogrel bisulfate) .... Take 1 daily 4)  Simvastatin 40 Mg Tabs (Simvastatin) .... Take 1 at bedtime 5)  Nitrostat 0.4 Mg Subl (Nitroglycerin) .... As directed 6)  Alprazolam 0.25 Mg Tabs (Alprazolam) .... Take 1 every 8 hrs as needed for anxiety 7)  Fluticasone Propionate 50 Mcg/act Susp (Fluticasone propionate) .... Use 2 sprays each nostril once daily 8)  Doxycycline Hyclate 100 Mg Caps (Doxycycline hyclate) .... Take one capsule two times a day x 10 days 9)  Symbicort 160-4.5 Mcg/act Aero (Budesonide-formoterol fumarate) .... Use 2 puffs two times a day  Patient Instructions: 1)  Please schedule a follow-up appointment in 2 months. 2)  I have ordered a breathing test for you. 3)  I have prescribed an antibiotic for you. 4)  Restart Symbicort inhaler two times a day. 5)  Tobacco is very bad for your health and your loved ones! You Should stop smoking!. 6)  Stop Smoking Tips: Choose a Quit date. Cut down before the Quit date. decide what you will do as a substitute when you feel the urge to smoke(gum,toothpick,exercise). Prescriptions: DOXYCYCLINE HYCLATE 100 MG CAPS (DOXYCYCLINE HYCLATE) take one capsule two times a day x 10 days  #20 x 0   Entered and Authorized by:   Esperanza Sheets PA   Signed by:   Esperanza Sheets PA on 05/14/2010   Method used:   Print then Give to Patient   RxID:   [id number] SYMBICORT 160-4.5 MCG/ACT AERO (BUDESONIDE-FORMOTEROL FUMARATE) use 2 puffs two times a day  #1 x 5   Entered and Authorized by:   Esperanza Sheets PA    Signed by:   Esperanza Sheets PA on 05/14/2010   Method used:   Print then Give to Patient   RxID:   [id number] SYMBICORT 160-4.5 MCG/ACT AERO (BUDESONIDE-FORMOTEROL FUMARATE) use 2 puffs two times a day  #1 x 5   Entered and Authorized by:   Esperanza Sheets PA   Signed by:   Esperanza Sheets PA on 05/14/2010   Method used:   [facility]ronically to        [facility] [phone]* (retail)       [address]       [place], [place]  [address]       Ph: [phone] or [phone]       Fax: [phone]   RxID:   [id number] DOXYCYCLINE HYCLATE 100 MG CAPS (DOXYCYCLINE HYCLATE) take one capsule two times a day x 10 days  #20 x 0   Entered and Authorized by:   Esperanza Sheets PA   Signed by:   Esperanza Sheets PA on 05/14/2010   Method used:   Electronically to        [facility] [phone]* (retail)       [address]  [place], [place]  [address]       Ph: [phone] or [phone]       Fax: [phone]   RxID:   [id number]    Orders Added: 1)  PFT Baseline-Pre/Post Bronchodiolator [PFT Baseline-Pre/Pos] 2)  Est. Patient Level III [[address]]

## 2010-08-28 NOTE — Assessment & Plan Note (Signed)
Summary: sick- room 1   Vital Signs:  Patient profile:   55 year old female Height:      61.5 inches Weight:      122 pounds BMI:     22.76 O2 Sat:      97 % on Room air Pulse rate:   95 / minute Resp:     16 per minute BP sitting:   140 / 70  (left arm)  Vitals Entered By: Adella Hare LPN (Nov 28, 1912 10:21 AM) CC: head and chest congestion Is Patient Diabetic? No Pain Assessment Patient in pain? no        Primary Provider:  Franchot Heidelberg, MD  CC:  head and chest congestion.  History of Present Illness: Pt states she awoke yesterday just not feeling well.  Noticed later in the afternoon that she had a foul taste in her mouth from phlegm. Awoke this am & has chest congestion, increased cough, and pressure in her ears.  Little nasal congestion but is clear.  Her phlegm from her chest is thick & discolored. Pt is a smoker.  Is trying to cut back. No fever. Not taking any cold or cough medications. Her husband has been sick with same syptoms x about 2 weeks now.  Pt states she has been checking her BP at home & it as been significantly lower.  Has noticed since she has been following a low sodium diet that it has come down alot.        Current Medications (verified): 1)  None  Allergies (verified): 1)  ! Trilipix (Choline Fenofibrate) 2)  ! * Omnicef (Cefdinir)  Past History:  Past medical history reviewed for relevance to current acute and chronic problems.  Past Medical History: Reviewed history from 06/25/2008 and no changes required. Current Problems:  ANXIETY DEPRESSION (ICD-300.4) HYPERLIPIDEMIA (ICD-272.4) ELEVATED BLOOD PRESSURE WITHOUT DIAGNOSIS OF HYPERTENSION (ICD-796.2) Family Hx of COLON CANCER (ICD-153.9) TOBACCO ABUSE (ICD-305.1) BRONCHITIS, ACUTE (ICD-466.0) ALLERGIC RHINITIS (ICD-477.9) IBS (ICD-564.1) MITRAL VALVE PROLAPSE (ICD-424.0)  Review of Systems General:  Denies chills and fever. ENT:  Complains of nasal congestion;  denies earache, postnasal drainage, sinus pressure, and sore throat. CV:  Denies chest pain or discomfort. Resp:  Complains of cough and sputum productive; denies shortness of breath and wheezing. Allergy:  Complains of seasonal allergies and sneezing.  Physical Exam  General:  Well-developed,well-nourished,in no acute distress; alert,appropriate and cooperative throughout examination. Pt does appear painful when she coughs. Head:  Normocephalic and atraumatic without obvious abnormalities. No apparent alopecia or balding. Ears:  External ear exam shows no significant lesions or deformities.  Otoscopic examination reveals clear canals, tympanic membranes are intact bilaterally without bulging, retraction, inflammation or discharge. Hearing is grossly normal bilaterally. Nose:  External nasal examination shows no deformity or inflammation. Nasal mucosa are pink and moist without lesions or exudates.no sinus percussion tenderness.   Mouth:  Oral mucosa and oropharynx without lesions or exudates.  Neck:  No deformities, masses, or tenderness noted. Lungs:  Normal respiratory effort, chest expands symmetrically. Lungs are clear to auscultation, no crackles or wheezes. Heart:  Normal rate and regular rhythm. S1 and S2 normal without gallop, murmur, click, rub or other extra sounds. Cervical Nodes:  No lymphadenopathy noted Psych:  Cognition and judgment appear intact. Alert and cooperative with normal attention span and concentration. No apparent delusions, illusions, hallucinations   Impression & Recommendations:  Problem # 1:  ACUTE BRONCHITIS (ICD-466.0) Assessment New  The following medications were removed from the  medication list:    Alahist Dm 7.11-27-13 Mg/54ml Liqd (Phenylephrine-bromphen-dm) ..... One tsp by mouth every 6 hours as needed Her updated medication list for this problem includes:    Azithromycin 250 Mg Tabs (Azithromycin) .Marland Kitchen... Take 2 tabs today, then 1 daily x 4 days     Promethazine-codeine 6.25-10 Mg/45ml Syrp (Promethazine-codeine) .Marland Kitchen... Take 2 tsp every 6 hrs as needed for cough  Problem # 2:  TOBACCO ABUSE (ICD-305.1) Assessment: Comment Only Encouraged pt to continue to try to decrease.  Problem # 3:  ELEVATED BLOOD PRESSURE WITHOUT DIAGNOSIS OF HYPERTENSION (ICD-796.2)  BP today: 140/70 Prior BP: 140/90 (10/14/2009)  Prior 10 Yr Risk Heart Disease: 11 % (12/20/2008)  Labs Reviewed: Creat: 0.76 (11/09/2008) Chol: 234 (11/09/2008)   HDL: 36 (11/09/2008)   LDL: 146 (11/09/2008)   TG: 259 (11/09/2008)  Instructed in low sodium diet (DASH Handout) and behavior modification.    Complete Medication List: 1)  Azithromycin 250 Mg Tabs (Azithromycin) .... Take 2 tabs today, then 1 daily x 4 days 2)  Promethazine-codeine 6.25-10 Mg/46ml Syrp (Promethazine-codeine) .... Take 2 tsp every 6 hrs as needed for cough  Patient Instructions: 1)  Tobacco is very bad for your health and your loved ones! You Should stop smoking!. 2)  Stop Smoking Tips: Choose a Quit date. Cut down before the Quit date. decide what you will do as a substitute when you feel the urge to smoke(gum,toothpick,exercise). 3)  Get plenty of rest, drink lots of clear liquids, and use Tylenol or Ibuprofen for fever and comfort. Return in 7-10 days if you're not better:sooner if you're feeling worse. 4)  I have prescribed an antibiotic for you and some cough medicine. 5)  Increase fluids and rest. Prescriptions: PROMETHAZINE-CODEINE 6.25-10 MG/5ML SYRP (PROMETHAZINE-CODEINE) take 2 tsp every 6 hrs as needed for cough  #6 oz x 0   Entered by:   Adella Hare LPN   Authorized by:   Esperanza Sheets PA   Signed by:   Adella Hare LPN on 96/10/5407   Method used:   Printed then faxed to ...       [facility]  [address] [phone]* (retail)       [address]       [place], [place]  [address]       Ph: [phone] or [phone]       Fax: [phone]   RxID:   [id number] AZITHROMYCIN  250 MG TABS (AZITHROMYCIN) take 2 tabs today, then 1 daily x 4 days  #6 x 0   Entered by:   Adella Hare LPN   Authorized by:   Esperanza Sheets PA   Signed by:   Adella Hare LPN on 95/63/8756   Method used:   Printed then faxed to ...       [facility]  [address] [phone]* (retail)       [address]       [place], [place]  [address]       Ph: [phone] or [phone]       Fax: [phone]   RxID:   [id number] PROMETHAZINE-CODEINE 6.25-10 MG/5ML SYRP (PROMETHAZINE-CODEINE) take 2 tsp every 6 hrs as needed for cough  #6 oz x 0   Entered and Authorized by:   Esperanza Sheets PA   Signed by:   Esperanza Sheets PA on 11/28/2009   Method used:   Printed then faxed to .Marland KitchenMarland Kitchen  [facility]  [address] [phone]* (retail)       [address]       [place], [place]  [address]       Ph: [phone] or [phone]       Fax: [phone]   RxID:   [id number] AZITHROMYCIN 250 MG TABS (AZITHROMYCIN) take 2 tabs today, then 1 daily x 4 days  #6 x 0   Entered and Authorized by:   Esperanza Sheets PA   Signed by:   Esperanza Sheets PA on 11/28/2009   Method used:   Electronically to        [facility] [phone]* (retail)       [address]       [place], [place]  [address]       Ph: [phone] or [phone]       Fax: [phone]   RxID:   [id number]

## 2010-08-28 NOTE — Letter (Signed)
Summary: Work Excuse  [facility]  [address]   [place], [place] [address]   Phone: [phone]  Fax: [phone]    Today's Date: January 16, 2010  Name of Patient: Gabriella Love  The above named patient had a medical visit today at:  9:15 am .  Please take this into consideration when reviewing the time away from work/school.    Special Instructions:  [  ] None  [ X ] To be off the remainder of today, returning to the normal work / school schedule tomorrow.  [  ] To be off until the next scheduled appointment on ______________________.  [  ] Other ________________________________________________________________ ________________________________________________________________________   Sincerely yours,   Esperanza Sheets PA

## 2010-10-09 LAB — CBC
HCT: 39.8 % (ref 36.0–46.0)
Hemoglobin: 13.6 g/dL (ref 12.0–15.0)
RBC: 4.21 MIL/uL (ref 3.87–5.11)
WBC: 12.6 10*3/uL — ABNORMAL HIGH (ref 4.0–10.5)

## 2010-10-09 LAB — BASIC METABOLIC PANEL
Calcium: 8.9 mg/dL (ref 8.4–10.5)
GFR calc Af Amer: >60 mL/min (ref >60)
GFR calc non Af Amer: >60 mL/min (ref >60)
Potassium: 3.7 mEq/L (ref 3.5–5.1)
Sodium: 139 mEq/L (ref 135–145)

## 2010-10-09 LAB — DIFFERENTIAL
Basophils Absolute: 0 10*3/uL (ref 0.0–0.1)
Basophils Relative: 0 % (ref 0–1)
Eosinophils Absolute: 0.1 10*3/uL (ref 0.0–0.7)
Monocytes Absolute: 0.9 10*3/uL (ref 0.1–1.0)
Monocytes Relative: 7 % (ref 3–12)
Neutrophils Relative %: 63 % (ref 43–77)

## 2010-10-09 LAB — POCT CARDIAC MARKERS: CKMB, poc: <1 ng/mL — ABNORMAL LOW (ref 1.0–8.0)

## 2010-10-13 LAB — GLUCOSE, CAPILLARY
Glucose-Capillary: 100 mg/dL — ABNORMAL HIGH (ref 70–99)
Glucose-Capillary: 123 mg/dL — ABNORMAL HIGH (ref 70–99)
Glucose-Capillary: 98 mg/dL (ref 70–99)

## 2010-10-13 LAB — COMPREHENSIVE METABOLIC PANEL
Alkaline Phosphatase: 103 U/L (ref 39–117)
BUN: 10 mg/dL (ref 6–23)
Chloride: 105 mEq/L (ref 96–112)
Creatinine, Ser: 0.87 mg/dL (ref 0.4–1.2)
Glucose, Bld: 130 mg/dL — ABNORMAL HIGH (ref 70–99)
Potassium: 3.5 mEq/L (ref 3.5–5.1)
Total Bilirubin: 0.3 mg/dL (ref 0.3–1.2)
Total Protein: 5.9 g/dL — ABNORMAL LOW (ref 6.0–8.3)

## 2010-10-13 LAB — BASIC METABOLIC PANEL
BUN: 11 mg/dL (ref 6–23)
BUN: 3 mg/dL — ABNORMAL LOW (ref 6–23)
BUN: 5 mg/dL — ABNORMAL LOW (ref 6–23)
CO2: 26 mEq/L (ref 19–32)
CO2: 24 mEq/L (ref 19–32)
Calcium: 8.6 mg/dL (ref 8.4–10.5)
Chloride: 101 mEq/L (ref 96–112)
Chloride: 109 mEq/L (ref 96–112)
Creatinine, Ser: 0.75 mg/dL (ref 0.4–1.2)
Creatinine, Ser: 0.76 mg/dL (ref 0.4–1.2)
Creatinine, Ser: 0.78 mg/dL (ref 0.4–1.2)
GFR calc Af Amer: >60 mL/min (ref >60)
GFR calc non Af Amer: >60 mL/min (ref >60)
GFR calc non Af Amer: >60 mL/min (ref >60)
GFR calc non Af Amer: >60 mL/min (ref >60)
GFR calc non Af Amer: >60 mL/min (ref >60)
Glucose, Bld: 151 mg/dL — ABNORMAL HIGH (ref 70–99)
Glucose, Bld: 128 mg/dL — ABNORMAL HIGH (ref 70–99)
Glucose, Bld: 94 mg/dL (ref 70–99)
Potassium: 3.7 mEq/L (ref 3.5–5.1)
Potassium: 3.5 mEq/L (ref 3.5–5.1)
Potassium: 3.6 mEq/L (ref 3.5–5.1)
Sodium: 139 mEq/L (ref 135–145)

## 2010-10-13 LAB — DIFFERENTIAL
Eosinophils Absolute: 0 10*3/uL (ref 0.0–0.7)
Eosinophils Absolute: 0 10*3/uL (ref 0.0–0.7)
Eosinophils Relative: 0 % (ref 0–5)
Eosinophils Relative: 0 % (ref 0–5)
Lymphocytes Relative: 16 % (ref 12–46)
Lymphs Abs: 3.6 10*3/uL (ref 0.7–4.0)
Lymphs Abs: 3.7 10*3/uL (ref 0.7–4.0)
Monocytes Relative: 6 % (ref 3–12)
Monocytes Relative: 7 % (ref 3–12)

## 2010-10-13 LAB — CBC
HCT: 46 % (ref 36.0–46.0)
HCT: 33.3 % — ABNORMAL LOW (ref 36.0–46.0)
HCT: 33.6 % — ABNORMAL LOW (ref 36.0–46.0)
HCT: 34.9 % — ABNORMAL LOW (ref 36.0–46.0)
Hemoglobin: 15.3 g/dL — ABNORMAL HIGH (ref 12.0–15.0)
MCHC: 33.2 g/dL (ref 30.0–36.0)
MCV: 93.6 fL (ref 78.0–100.0)
Platelets: 436 10*3/uL — ABNORMAL HIGH (ref 150–400)
Platelets: 239 10*3/uL (ref 150–400)
Platelets: 246 10*3/uL (ref 150–400)
Platelets: 253 10*3/uL (ref 150–400)
Platelets: 278 10*3/uL (ref 150–400)
RBC: 3.7 MIL/uL — ABNORMAL LOW (ref 3.87–5.11)
RDW: 14.2 % (ref 11.5–15.5)
RDW: 14.5 % (ref 11.5–15.5)
RDW: 14.6 % (ref 11.5–15.5)
RDW: 14.9 % (ref 11.5–15.5)
WBC: 6.9 10*3/uL (ref 4.0–10.5)
WBC: 8.2 10*3/uL (ref 4.0–10.5)

## 2010-10-13 LAB — CARDIAC PANEL(CRET KIN+CKTOT+MB+TROPI)
CK, MB: 20.5 ng/mL (ref 0.3–4.0)
Relative Index: 10.2 — ABNORMAL HIGH (ref 0.0–2.5)
Total CK: 717 U/L — ABNORMAL HIGH (ref 7–177)
Troponin I: 0.66 ng/mL (ref 0.00–0.06)

## 2010-10-13 LAB — LIPID PANEL
HDL: 28 mg/dL — ABNORMAL LOW (ref >39)
LDL Cholesterol: 96 mg/dL (ref 0–99)
Total CHOL/HDL Ratio: 5.3 RATIO
Triglycerides: 127 mg/dL (ref <150)
VLDL: 25 mg/dL (ref 0–40)

## 2010-10-13 LAB — BRAIN NATRIURETIC PEPTIDE: Pro B Natriuretic peptide (BNP): 54 pg/mL (ref 0.0–100.0)

## 2010-10-13 LAB — PROTIME-INR
INR: 1.05 (ref 0.00–1.49)
Prothrombin Time: 12.6 seconds (ref 11.6–15.2)
Prothrombin Time: 13.6 seconds (ref 11.6–15.2)

## 2010-10-13 LAB — CK TOTAL AND CKMB (NOT AT ARMC)
Relative Index: 9.2 — ABNORMAL HIGH (ref 0.0–2.5)
Relative Index: 14.6 — ABNORMAL HIGH (ref 0.0–2.5)

## 2010-10-13 LAB — TSH: TSH: 0.942 u[IU]/mL (ref 0.350–4.500)

## 2010-10-13 LAB — TROPONIN I: Troponin I: 0.36 ng/mL — ABNORMAL HIGH (ref 0.00–0.06)

## 2010-10-13 LAB — HEMOGLOBIN A1C
Hgb A1c MFr Bld: 5.7 % — ABNORMAL HIGH (ref <5.7)
Mean Plasma Glucose: 117 mg/dL — ABNORMAL HIGH (ref <117)

## 2010-10-13 LAB — APTT: aPTT: 30 seconds (ref 24–37)

## 2011-02-23 ENCOUNTER — Ambulatory Visit: Payer: Self-pay | Admitting: Family Medicine

## 2011-02-26 ENCOUNTER — Encounter: Payer: Self-pay | Admitting: Family Medicine

## 2011-02-26 ENCOUNTER — Ambulatory Visit (INDEPENDENT_AMBULATORY_CARE_PROVIDER_SITE_OTHER): Payer: BC Managed Care – PPO | Admitting: Family Medicine

## 2011-02-26 ENCOUNTER — Encounter: Payer: Self-pay | Admitting: *Deleted

## 2011-02-26 VITALS — BP 120/60 | HR 80 | Ht 61.5 in | Wt 123.1 lb

## 2011-02-26 DIAGNOSIS — R1013 Epigastric pain: Secondary | ICD-10-CM

## 2011-02-26 DIAGNOSIS — K219 Gastro-esophageal reflux disease without esophagitis: Secondary | ICD-10-CM

## 2011-02-26 DIAGNOSIS — I2510 Atherosclerotic heart disease of native coronary artery without angina pectoris: Secondary | ICD-10-CM

## 2011-02-26 MED ORDER — RANITIDINE HCL 150 MG PO CAPS: 150.0000 mg | ORAL_CAPSULE | Freq: Two times a day (BID) | ORAL | Status: DC

## 2011-02-26 NOTE — Progress Notes (Signed)
  Subjective:    Patient ID: Gabriella Love, female    DOB: [birth year], 55 y.o.   MRN: [id number]  HPI Previous pt of [facility], transferred to Dr. Janna [facility]- has had reflux for years, past year has had episodes feeling like her food is getting stuck and she has epigastric soreness. She has never seen a buldge on her abdomen - currently  On Rantidine 75mg  OTC, unable to take PPI with HCL salts per her cardiologist. The ranitidine has helped some in the past 8 days since she has started this. Belching also helps relieve her symptoms.- The patient admits to feeling acid go up towards her throat as well as sour taste in mouth. She says occasionally she had a bowel movement after having severe heartburn were only yellow acid comes out. She denies any blood in her stool. She states her mother had similar problem however this was never checked out her mother did die from colon cancer. Spicey foods cause pain  Coronary Artery disease- patient is being followed by Dr. Katrinka Blazing at [facility] cardiology in [place]. She denies any recent chest pain or diaphoresis. She has not used her nitroglycerin since January of this year. She states her reflux does not feel like typical chest pain although she does not remember exactly how she felt when she was diagnosed with a heart attack.  Tobacco use- patient is not ready to quit smoking at this time she states we will discuss this at a later time.   Cardiology- Verdis Prime MD [facility])   Review of Systems -   Denies- fatigue, fever, weakness  CVS- no chest pain, SOB  GI- no emesis, no constipation     Objective:   Physical Exam GEN- NAD, alert and oriented x3 HEENT-  MMM, oropharynx clear Neck- Supple, no bruit CVS- RRR, no murmur RESP-CTAB Abd- Soft, NABS, ND, mild TTP in epigastric region, no abd hernia seen, no organomegaly         Assessment & Plan:

## 2011-02-26 NOTE — Patient Instructions (Signed)
Increase your Zantac to 150mg  twice a day  We will set you up for a barium study  Next visit in 4 weeks

## 2011-02-26 NOTE — Assessment & Plan Note (Signed)
Increase Zantac to 150mg  BID, pt understands foods to avoid at this time, keep upright after meals

## 2011-02-26 NOTE — Assessment & Plan Note (Addendum)
Patient's epigastric pain and feeling of fullness is concerning for gastroesophageal reflux disease. He is also possibility of having a hiatal hernia or other motility disorder, especially with the feeling of food being stuck at the epigastric level. I will set her up for a barium study to visualize this.

## 2011-02-26 NOTE — Assessment & Plan Note (Signed)
Obtain records from patient's cardiologist to change the medications at this time.

## 2011-03-02 ENCOUNTER — Telehealth: Payer: Self-pay | Admitting: Family Medicine

## 2011-03-04 NOTE — Telephone Encounter (Signed)
Patient was wanting to know why her barium swallow test has not been scheduled yet. I advised her that we would get to it as soon as we could and call her with the appt. She did not want it for tomorrow but any other day is fine

## 2011-03-10 ENCOUNTER — Encounter: Payer: Self-pay | Admitting: Family Medicine

## 2011-03-26 ENCOUNTER — Ambulatory Visit (INDEPENDENT_AMBULATORY_CARE_PROVIDER_SITE_OTHER): Payer: BC Managed Care – PPO | Admitting: Family Medicine

## 2011-03-26 ENCOUNTER — Encounter: Payer: Self-pay | Admitting: Family Medicine

## 2011-03-26 DIAGNOSIS — R109 Unspecified abdominal pain: Secondary | ICD-10-CM | POA: Insufficient documentation

## 2011-03-26 DIAGNOSIS — K219 Gastro-esophageal reflux disease without esophagitis: Secondary | ICD-10-CM

## 2011-03-26 MED ORDER — CIPROFLOXACIN HCL 500 MG PO TABS: 500.0000 mg | ORAL_TABLET | Freq: Two times a day (BID) | ORAL | Status: AC

## 2011-03-26 MED ORDER — PROMETHAZINE HCL 25 MG PO TABS: 25.0000 mg | ORAL_TABLET | Freq: Four times a day (QID) | ORAL | Status: AC | PRN

## 2011-03-26 MED ORDER — METRONIDAZOLE 500 MG PO TABS: 500.0000 mg | ORAL_TABLET | Freq: Two times a day (BID) | ORAL | Status: AC

## 2011-03-26 NOTE — Progress Notes (Signed)
  Subjective:    Patient ID: Gabriella Love, female    DOB: Jan 04, 1956, 55 y.o.   MRN: [id number]  HPI   Abd pain-left lower quadrant abdominal pain for 3 days. She has a history of similar pain treated as diverticulitis which resolved with antibiotics. Her last flare was earlier this year. She states her normal temperature is 5F which is been this way her entire life. She feels  feverish, no chills, had BM yesterday no blood, she typically has looser stools secondary to her IBS to yesterday's bowel movement was more difficult. She is unsure of any particular factors that started her, pain but does note that she ate tomatoes with small seeds which she typically does not do. Her last colonoscopy was in 2001 and she declines to have any further colonoscopy.   Zantac has been helping to heartburn however she was never called back from our office about the barium swallow, as she has improved she does not want to study at this time.   Review of Systems GEN- positive fever, denies chills, denies weakness Cardiac- no chest pain, no palpitations Resp- no shortness of breath, no cough GU- positive abdominal pain, positive loose stools, denies dysuria, denies blood in stools, occasional nausea no emesis, no vaginal bleeding, no discharge    Objective:   Physical Exam  GEN- NAD, alert and oriented  CVS- RRR, no murmur  RESP-CTAB  ABD- slightly hypoactive BS throughout, soft, ND, TTP in LLQ, no rebound, no guarding, no masses felt, no palpable stool, no CVA tenderness       Assessment & Plan:

## 2011-03-26 NOTE — Patient Instructions (Addendum)
Start the antibiotics as prescribed, drink plenty of fluids I will call Monday to check on your progress IF you are not improved then we will get a CT scan

## 2011-03-26 NOTE — Assessment & Plan Note (Signed)
Patient tolerating medication well. She did have some concern as it cost a lot with her insurance. At this time we will hold off on barium swallow

## 2011-03-26 NOTE — Assessment & Plan Note (Addendum)
Patient's left lower quadrant abdominal pain associated with her low-grade fever is concerning for diverticulitis. She denies any vaginal complaints. At this time we will go ahead and treat for diverticulitis flare she has had in the past and this feels similar. Will start her on Cipro and Flagyl. If she is not better by Monday I will obtain CT scan of abdomen and pelvis. Phenergan for  nausea

## 2011-03-30 ENCOUNTER — Telehealth: Payer: Self-pay | Admitting: Family Medicine

## 2011-03-30 NOTE — Telephone Encounter (Signed)
Called pt, LVM to check on abd pain.

## 2011-07-10 ENCOUNTER — Ambulatory Visit (HOSPITAL_COMMUNITY)
Admission: RE | Admit: 2011-07-10 | Discharge: 2011-07-10 | Disposition: A | Discharge status: Home / Self Care | Payer: 59 | Source: Ambulatory Visit | Attending: Internal Medicine | Admitting: Internal Medicine

## 2011-07-10 ENCOUNTER — Other Ambulatory Visit (HOSPITAL_COMMUNITY): Payer: Self-pay | Admitting: Internal Medicine

## 2011-07-10 DIAGNOSIS — R059 Cough, unspecified: Secondary | ICD-10-CM | POA: Insufficient documentation

## 2011-07-10 DIAGNOSIS — R918 Other nonspecific abnormal finding of lung field: Secondary | ICD-10-CM | POA: Insufficient documentation

## 2011-07-10 DIAGNOSIS — R05 Cough: Secondary | ICD-10-CM

## 2011-07-29 ENCOUNTER — Ambulatory Visit (HOSPITAL_COMMUNITY)
Admission: RE | Admit: 2011-07-29 | Discharge: 2011-07-29 | Disposition: A | Discharge status: Home / Self Care | Payer: 59 | Source: Ambulatory Visit | Attending: Internal Medicine | Admitting: Internal Medicine

## 2011-07-29 ENCOUNTER — Other Ambulatory Visit (HOSPITAL_COMMUNITY): Payer: Self-pay | Admitting: Internal Medicine

## 2011-07-29 DIAGNOSIS — R52 Pain, unspecified: Secondary | ICD-10-CM

## 2011-07-29 DIAGNOSIS — M79609 Pain in unspecified limb: Secondary | ICD-10-CM | POA: Insufficient documentation

## 2011-11-20 ENCOUNTER — Encounter (HOSPITAL_COMMUNITY): Payer: Self-pay

## 2011-11-20 ENCOUNTER — Emergency Department (HOSPITAL_COMMUNITY): Payer: 59

## 2011-11-20 ENCOUNTER — Emergency Department (HOSPITAL_COMMUNITY)
Admission: EM | Admit: 2011-11-20 | Discharge: 2011-11-21 | Disposition: A | Discharge status: Home / Self Care | Payer: 59 | Attending: Emergency Medicine | Admitting: Emergency Medicine

## 2011-11-20 DIAGNOSIS — I2510 Atherosclerotic heart disease of native coronary artery without angina pectoris: Secondary | ICD-10-CM | POA: Diagnosis present

## 2011-11-20 DIAGNOSIS — Z8679 Personal history of other diseases of the circulatory system: Secondary | ICD-10-CM

## 2011-11-20 DIAGNOSIS — E785 Hyperlipidemia, unspecified: Secondary | ICD-10-CM | POA: Insufficient documentation

## 2011-11-20 DIAGNOSIS — I10 Essential (primary) hypertension: Secondary | ICD-10-CM | POA: Diagnosis present

## 2011-11-20 DIAGNOSIS — F341 Dysthymic disorder: Secondary | ICD-10-CM | POA: Diagnosis present

## 2011-11-20 DIAGNOSIS — R42 Dizziness and giddiness: Secondary | ICD-10-CM | POA: Insufficient documentation

## 2011-11-20 DIAGNOSIS — F17200 Nicotine dependence, unspecified, uncomplicated: Secondary | ICD-10-CM | POA: Diagnosis present

## 2011-11-20 DIAGNOSIS — Z79899 Other long term (current) drug therapy: Secondary | ICD-10-CM | POA: Insufficient documentation

## 2011-11-20 DIAGNOSIS — R51 Headache: Secondary | ICD-10-CM | POA: Insufficient documentation

## 2011-11-20 DIAGNOSIS — R110 Nausea: Secondary | ICD-10-CM | POA: Diagnosis present

## 2011-11-20 LAB — POCT I-STAT, CHEM 8
Creatinine, Ser: 0.8 mg/dL (ref 0.50–1.10)
HCT: 43 % (ref 36.0–46.0)
Hemoglobin: 14.6 g/dL (ref 12.0–15.0)
Sodium: 140 mEq/L (ref 135–145)
TCO2: 24 mmol/L (ref 0–100)

## 2011-11-20 LAB — POCT I-STAT TROPONIN I: Troponin i, poc: 0 ng/mL (ref 0.00–0.08)

## 2011-11-20 MED ORDER — NITROGLYCERIN 0.4 MG SL SUBL: 0.4000 mg | SUBLINGUAL_TABLET | SUBLINGUAL | Status: DC | PRN

## 2011-11-20 NOTE — ED Notes (Signed)
Pt arrived by EMS with complaints of chest pain that started upon arrival of EMS. Pt took 2 nitro PTA of EMS. EMS gave 1 nitro IV established. Pt now pain free denies SOB or nausea

## 2011-11-21 DIAGNOSIS — R110 Nausea: Secondary | ICD-10-CM | POA: Diagnosis present

## 2011-11-21 LAB — COMPREHENSIVE METABOLIC PANEL
Alkaline Phosphatase: 91 U/L (ref 39–117)
BUN: 9 mg/dL (ref 6–23)
CO2: 27 mEq/L (ref 19–32)
Chloride: 103 mEq/L (ref 96–112)
Creatinine, Ser: 0.73 mg/dL (ref 0.50–1.10)
GFR calc Af Amer: >90 mL/min (ref >90)
GFR calc non Af Amer: >90 mL/min (ref >90)
Glucose, Bld: 115 mg/dL — ABNORMAL HIGH (ref 70–99)
Potassium: 3.8 mEq/L (ref 3.5–5.1)
Total Bilirubin: 0.2 mg/dL — ABNORMAL LOW (ref 0.3–1.2)

## 2011-11-21 LAB — CBC
HCT: 42.8 % (ref 36.0–46.0)
Hemoglobin: 14.3 g/dL (ref 12.0–15.0)
MCV: 91.5 fL (ref 78.0–100.0)
WBC: 8.1 10*3/uL (ref 4.0–10.5)

## 2011-11-21 MED ORDER — SODIUM CHLORIDE 0.9 % IJ SOLN: 3.0000 mL | Freq: Two times a day (BID) | INTRAMUSCULAR | Status: DC

## 2011-11-21 MED ORDER — ONDANSETRON HCL 4 MG/2ML IJ SOLN
4.0000 mg | Freq: Once | INTRAMUSCULAR | Status: AC
  Administered 2011-11-21: 4 mg via INTRAVENOUS
  Filled 2011-11-21: qty 2

## 2011-11-21 MED ORDER — ASPIRIN EC 325 MG PO TBEC: 325.0000 mg | DELAYED_RELEASE_TABLET | Freq: Every day | ORAL | Status: DC

## 2011-11-21 MED ORDER — SODIUM CHLORIDE 0.9 % IV SOLN: 250.0000 mL | INTRAVENOUS | Status: DC | PRN

## 2011-11-21 MED ORDER — SODIUM CHLORIDE 0.9 % IJ SOLN: 3.0000 mL | INTRAMUSCULAR | Status: DC | PRN

## 2011-11-21 NOTE — ED Notes (Signed)
Patient called secretary from call bell, stated she wants to go home. Went into patient's room to talk to patient. Patient stated she was nervous, just wanted to go home and go to sleep and wanted to get some rest. States she feels fine and has no complaints at this time. Patient requested that her iv be taken out. Spoke to dr. Dierdre Highman, notified him of patient's requests and that patient wanted to go home. Dr. Dierdre Highman stated to let patient sign out ama if she still wanted to leave after i explained risks to her. Spoke with patient and notified her that i had already called report and that we were going to take her upstairs at this time if she wanted to stay. Patient stated she was ready to go. Risks explained to patient such as worsening of condition, increasing cardiac markers in blood work, and possible death. Patient verbalized understanding.

## 2011-11-21 NOTE — ED Provider Notes (Signed)
History     CSN: [id number]  Arrival date & time 11/20/11  2315   First MD Initiated Contact with Patient 11/20/11 2325      Chief Complaint  Patient presents with  . Chest Pain    (Consider location/radiation/quality/duration/timing/severity/associated sxs/prior treatment) HPI History provided by the patient. At home tonight developed some headache, dizziness and nausea. Patient took some nitroglycerin, as these are the same symptoms she had with a previous MI. Medication did not help her symptoms so EMS was called. Patient was given aspirin and more nitroglycerin in route with some improvement of symptoms. On arrival she denies any dizziness or headache does have some mild nausea. No chest pain or difficulty breathing. No diaphoresis. No vomiting. No abdominal pain or back pain. Patient is followed by [facility] cardiology Dr. Katrinka Blazing. She has not had any heart issues since requiring stent with previous MI as above.  Mild to moderate severity. No known aggravating or alleviating factors otherwise. No fevers or recent illness. No syncope. Past Medical History  Diagnosis Date  . Hypertension   . Hyperlipidemia   . Heart disease     Past Surgical History  Procedure Date  . Tubal ligation   . Cholecystectomy   . Coronary stent placement     Family History  Problem Relation Age of Onset  . Cancer Mother     colon  . Depression Mother   . Heart disease Father   . Depression Sister   . Cancer Sister     leukemia  . Hyperlipidemia Brother   . Hypertension Brother     History  Substance Use Topics  . Smoking status: Current Everyday Smoker -- 0.5 packs/day  . Smokeless tobacco: Not on file  . Alcohol Use: No    OB History    Grav Para Term Preterm Abortions TAB SAB Ect Mult Living                  Review of Systems  Constitutional: Negative for fever and chills.  HENT: Negative for neck pain and neck stiffness.   Eyes: Negative for pain.  Respiratory: Negative for  shortness of breath.   Cardiovascular: Negative for chest pain.  Gastrointestinal: Positive for nausea. Negative for abdominal pain.  Genitourinary: Negative for dysuria.  Musculoskeletal: Negative for back pain.  Skin: Negative for rash.  Neurological: Positive for dizziness and headaches.  All other systems reviewed and are negative.    Allergies  Oxycodone-acetaminophen and Trilipix  Home Medications   Current Outpatient Rx  Name Route Sig Dispense Refill  . ASPIRIN 325 MG PO TABS Oral Take 325 mg by mouth daily.      Marland Kitchen METOPROLOL SUCCINATE ER 25 MG PO TB24 Oral Take 25 mg by mouth daily.      Marland Kitchen NITROGLYCERIN 0.4 MG SL SUBL Sublingual Place 0.4 mg under the tongue every 5 (five) minutes as needed.      Marland Kitchen RANITIDINE HCL 150 MG PO CAPS Oral Take 1 capsule (150 mg total) by mouth 2 (two) times daily. 60 capsule 3  . SIMVASTATIN 40 MG PO TABS Oral Take 40 mg by mouth at bedtime.        BP 138/69  Temp(Src) 98 F (36.7 C) (Oral)  Resp 18  Ht 5' 1.5" (1.562 m)  Wt 123 lb (55.792 kg)  BMI 22.86 kg/m2  SpO2 95%  Physical Exam  Constitutional: She is oriented to person, place, and time. She appears well-developed and well-nourished.  HENT:  Head: Normocephalic  and atraumatic.  Eyes: Conjunctivae and EOM are normal. Pupils are equal, round, and reactive to light.  Neck: Trachea normal. Neck supple. No thyromegaly present.  Cardiovascular: Normal rate, regular rhythm, S1 normal, S2 normal and normal pulses.     No systolic murmur is present   No diastolic murmur is present  Pulses:      Radial pulses are 2+ on the right side, and 2+ on the left side.  Pulmonary/Chest: Effort normal and breath sounds normal. She has no wheezes. She has no rhonchi. She has no rales. She exhibits no tenderness.  Abdominal: Soft. Normal appearance and bowel sounds are normal. There is no tenderness. There is no CVA tenderness and negative Murphy's sign.  Musculoskeletal:       BLE:s Calves  nontender, no cords or erythema, negative Homans sign  Neurological: She is alert and oriented to person, place, and time. She has normal strength. No cranial nerve deficit or sensory deficit. GCS eye subscore is 4. GCS verbal subscore is 5. GCS motor subscore is 6.  Skin: Skin is warm and dry. No rash noted. She is not diaphoretic.  Psychiatric: Her speech is normal.       Cooperative and appropriate    ED Course  Procedures (including critical care time)  Results for orders placed during the hospital encounter of 11/20/11  CBC      Component Value Range   WBC 8.1  4.0 - 10.5 (K/uL)   RBC 4.68  3.87 - 5.11 (MIL/uL)   Hemoglobin 14.3  12.0 - 15.0 (g/dL)   HCT 16.1  09.6 - 04.5 (%)   MCV 91.5  78.0 - 100.0 (fL)   MCH 30.6  26.0 - 34.0 (pg)   MCHC 33.4  30.0 - 36.0 (g/dL)   RDW 40.9  81.1 - 91.4 (%)   Platelets 204  150 - 400 (K/uL)  POCT I-STAT, CHEM 8      Component Value Range   Sodium 140  135 - 145 (mEq/L)   Potassium 3.8  3.5 - 5.1 (mEq/L)   Chloride 105  96 - 112 (mEq/L)   BUN 8  6 - 23 (mg/dL)   Creatinine, Ser 7.82  0.50 - 1.10 (mg/dL)   Glucose, Bld 956 (*) 70 - 99 (mg/dL)   Calcium, Ion 2.13  0.86 - 1.32 (mmol/L)   TCO2 24  0 - 100 (mmol/L)   Hemoglobin 14.6  12.0 - 15.0 (g/dL)   HCT 57.8  46.9 - 62.9 (%)  POCT I-STAT TROPONIN I      Component Value Range   Troponin i, poc 0.00  0.00 - 0.08 (ng/mL)   Comment 3            Dg Chest Portable 1 View  11/20/2011  *RADIOLOGY REPORT*  Clinical Data: Chest pain  PORTABLE CHEST - 1 VIEW  Comparison: 07/10/2011  Findings: Normal heart size and clear lungs.  No pneumothorax or pleural effusion.  IMPRESSION: No active cardiopulmonary disease.  Original Report Authenticated By: Donavan Burnet, M.D.     Date: 11/21/2011  Rate: 73  Rhythm: normal sinus rhythm  QRS Axis: normal  Intervals: normal  ST/T Wave abnormalities: nonspecific ST changes  Conduction Disutrbances:none  Narrative Interpretation:   Old EKG Reviewed:  unchanged  As your nitroglycerin and Zofran and has improving symptoms, still without any chest pain or shortness of breath.  MDM   Headache, dizziness and nausea that patient is concerned may be cardiac equivalent with history of same.  Aspirin and nitroglycerin provided prior to arrival and nitroglycerin in ED. Stat EKG, chest x-ray and labs obtained and reviewed as above. Medicine consult for admission.  Medicine consult obtained and case discussed as above with Dr. Onalee Hua, triad hospitalist, who agrees to admission        Sunnie Nielsen, MD 11/21/11 0045

## 2011-11-27 NOTE — Care Management (Signed)
UR chart review completed per payer request.

## 2012-01-12 ENCOUNTER — Other Ambulatory Visit (HOSPITAL_COMMUNITY): Payer: Self-pay | Admitting: Internal Medicine

## 2012-01-12 ENCOUNTER — Ambulatory Visit (HOSPITAL_COMMUNITY)
Admission: RE | Admit: 2012-01-12 | Discharge: 2012-01-12 | Disposition: A | Discharge status: Home / Self Care | Payer: 59 | Source: Ambulatory Visit | Attending: Internal Medicine | Admitting: Internal Medicine

## 2012-01-12 DIAGNOSIS — R42 Dizziness and giddiness: Secondary | ICD-10-CM | POA: Insufficient documentation

## 2012-08-30 ENCOUNTER — Ambulatory Visit (HOSPITAL_COMMUNITY)
Admission: RE | Admit: 2012-08-30 | Discharge: 2012-08-30 | Disposition: A | Discharge status: Home / Self Care | Payer: 59 | Source: Ambulatory Visit | Attending: Internal Medicine | Admitting: Internal Medicine

## 2012-08-30 ENCOUNTER — Other Ambulatory Visit (HOSPITAL_COMMUNITY): Payer: Self-pay | Admitting: Internal Medicine

## 2012-08-30 DIAGNOSIS — R05 Cough: Secondary | ICD-10-CM | POA: Insufficient documentation

## 2012-08-30 DIAGNOSIS — R059 Cough, unspecified: Secondary | ICD-10-CM | POA: Insufficient documentation

## 2013-01-26 ENCOUNTER — Emergency Department (HOSPITAL_COMMUNITY)
Admission: EM | Admit: 2013-01-26 | Discharge: 2013-01-26 | Disposition: A | Discharge status: Home / Self Care | Payer: 59 | Attending: Emergency Medicine | Admitting: Emergency Medicine

## 2013-01-26 ENCOUNTER — Encounter (HOSPITAL_COMMUNITY): Payer: Self-pay

## 2013-01-26 DIAGNOSIS — S59909A Unspecified injury of unspecified elbow, initial encounter: Secondary | ICD-10-CM | POA: Insufficient documentation

## 2013-01-26 DIAGNOSIS — S298XXA Other specified injuries of thorax, initial encounter: Secondary | ICD-10-CM | POA: Insufficient documentation

## 2013-01-26 DIAGNOSIS — I10 Essential (primary) hypertension: Secondary | ICD-10-CM | POA: Insufficient documentation

## 2013-01-26 DIAGNOSIS — S6990XA Unspecified injury of unspecified wrist, hand and finger(s), initial encounter: Secondary | ICD-10-CM | POA: Insufficient documentation

## 2013-01-26 DIAGNOSIS — Y939 Activity, unspecified: Secondary | ICD-10-CM | POA: Diagnosis not present

## 2013-01-26 DIAGNOSIS — S4980XA Other specified injuries of shoulder and upper arm, unspecified arm, initial encounter: Secondary | ICD-10-CM | POA: Diagnosis present

## 2013-01-26 DIAGNOSIS — E785 Hyperlipidemia, unspecified: Secondary | ICD-10-CM | POA: Insufficient documentation

## 2013-01-26 DIAGNOSIS — Z7982 Long term (current) use of aspirin: Secondary | ICD-10-CM | POA: Insufficient documentation

## 2013-01-26 DIAGNOSIS — F17200 Nicotine dependence, unspecified, uncomplicated: Secondary | ICD-10-CM | POA: Insufficient documentation

## 2013-01-26 DIAGNOSIS — S0003XA Contusion of scalp, initial encounter: Secondary | ICD-10-CM | POA: Insufficient documentation

## 2013-01-26 DIAGNOSIS — IMO0002 Reserved for concepts with insufficient information to code with codable children: Secondary | ICD-10-CM | POA: Insufficient documentation

## 2013-01-26 DIAGNOSIS — R209 Unspecified disturbances of skin sensation: Secondary | ICD-10-CM | POA: Diagnosis not present

## 2013-01-26 DIAGNOSIS — Z8679 Personal history of other diseases of the circulatory system: Secondary | ICD-10-CM | POA: Insufficient documentation

## 2013-01-26 DIAGNOSIS — Z79899 Other long term (current) drug therapy: Secondary | ICD-10-CM | POA: Insufficient documentation

## 2013-01-26 DIAGNOSIS — S46912A Strain of unspecified muscle, fascia and tendon at shoulder and upper arm level, left arm, initial encounter: Secondary | ICD-10-CM

## 2013-01-26 DIAGNOSIS — S1093XA Contusion of unspecified part of neck, initial encounter: Secondary | ICD-10-CM | POA: Insufficient documentation

## 2013-01-26 DIAGNOSIS — Y92410 Unspecified street and highway as the place of occurrence of the external cause: Secondary | ICD-10-CM | POA: Diagnosis not present

## 2013-01-26 MED ORDER — HYDROCODONE-ACETAMINOPHEN 5-325 MG PO TABS: 1.0000 | ORAL_TABLET | ORAL | Status: DC | PRN

## 2013-01-26 MED ORDER — ACETAMINOPHEN 500 MG PO TABS
1000.0000 mg | ORAL_TABLET | Freq: Once | ORAL | Status: AC
  Administered 2013-01-26: 1000 mg via ORAL
  Filled 2013-01-26: qty 2

## 2013-01-26 MED ORDER — DICLOFENAC SODIUM 75 MG PO TBEC: 75.0000 mg | DELAYED_RELEASE_TABLET | Freq: Two times a day (BID) | ORAL | Status: DC

## 2013-01-26 MED ORDER — IBUPROFEN 800 MG PO TABS
800.0000 mg | ORAL_TABLET | Freq: Once | ORAL | Status: AC
  Administered 2013-01-26: 800 mg via ORAL
  Filled 2013-01-26: qty 1

## 2013-01-26 NOTE — ED Provider Notes (Signed)
Medical screening examination/treatment/procedure(s) were performed by non-physician practitioner and as supervising physician I was immediately available for consultation/collaboration.   Joya Gaskins, MD 01/26/13 [phone]
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
## 2013-08-15 ENCOUNTER — Encounter: Payer: Self-pay | Admitting: Obstetrics & Gynecology

## 2013-08-15 ENCOUNTER — Encounter (INDEPENDENT_AMBULATORY_CARE_PROVIDER_SITE_OTHER): Payer: Self-pay

## 2013-08-15 ENCOUNTER — Ambulatory Visit (INDEPENDENT_AMBULATORY_CARE_PROVIDER_SITE_OTHER): Payer: 59 | Admitting: Obstetrics & Gynecology

## 2013-08-15 VITALS — BP 130/78 | Ht 61.5 in | Wt 123.0 lb

## 2013-08-15 DIAGNOSIS — N760 Acute vaginitis: Secondary | ICD-10-CM | POA: Insufficient documentation

## 2013-08-15 MED ORDER — FIRST-DUKES MOUTHWASH MT SUSP: 5.0000 mL | Freq: Three times a day (TID) | OROMUCOSAL | Status: DC

## 2013-08-15 MED ORDER — DOXYCYCLINE HYCLATE 50 MG PO CAPS
100.0000 mg | ORAL_CAPSULE | Freq: Two times a day (BID) | ORAL | Status: DC
Start: 2013-08-15 — End: 2014-12-03

## 2013-08-15 MED ORDER — METRONIDAZOLE 0.75 % EX GEL: 1.0000 "application " | Freq: Two times a day (BID) | CUTANEOUS | Status: DC

## 2013-08-15 NOTE — Progress Notes (Signed)
Patient ID: Gabriella Love, female   DOB: Feb 26, 1956, 58 y.o.   MRN: [id number] Pt with heavy symptomatic discharge since last Wednesday Had similar 13 years ago  Wet prep Heavy heavy WBC infiltrate with "dirty" epitheleal cells No yeast and no trichomonasds  Unsure of cause  Will shotgun with doxycycline and metronidazole gel

## 2013-08-17 ENCOUNTER — Encounter: Payer: Self-pay | Admitting: Obstetrics and Gynecology

## 2013-08-29 ENCOUNTER — Ambulatory Visit: Payer: 59 | Admitting: Obstetrics & Gynecology

## 2013-08-30 ENCOUNTER — Encounter: Payer: Self-pay | Admitting: Obstetrics & Gynecology

## 2013-08-30 ENCOUNTER — Ambulatory Visit (INDEPENDENT_AMBULATORY_CARE_PROVIDER_SITE_OTHER): Payer: 59 | Admitting: Obstetrics & Gynecology

## 2013-08-30 VITALS — BP 150/80 | Wt 125.0 lb

## 2013-08-30 DIAGNOSIS — N72 Inflammatory disease of cervix uteri: Secondary | ICD-10-CM | POA: Insufficient documentation

## 2013-08-30 MED ORDER — METRONIDAZOLE 0.75 % VA GEL: VAGINAL | Status: DC

## 2013-08-30 NOTE — Progress Notes (Signed)
Patient ID: Gabriella Love, female   DOB: 1956-03-05, 58 y.o.   MRN: [id number] It appears pt got topical metronidazole not vaginal  Wet prep reveals still a heavy WBC infiltrate no yeast no trich  Metro gel vaginal   Follow up in 2 weeks

## 2013-08-31 ENCOUNTER — Other Ambulatory Visit: Payer: Self-pay | Admitting: Obstetrics & Gynecology

## 2013-09-14 ENCOUNTER — Ambulatory Visit (INDEPENDENT_AMBULATORY_CARE_PROVIDER_SITE_OTHER): Payer: 59 | Admitting: Obstetrics & Gynecology

## 2013-09-14 ENCOUNTER — Encounter (INDEPENDENT_AMBULATORY_CARE_PROVIDER_SITE_OTHER): Payer: Self-pay

## 2013-09-14 ENCOUNTER — Encounter: Payer: Self-pay | Admitting: Obstetrics & Gynecology

## 2013-09-14 VITALS — BP 110/70 | Wt 124.0 lb

## 2013-09-14 DIAGNOSIS — B373 Candidiasis of vulva and vagina: Secondary | ICD-10-CM

## 2013-09-14 DIAGNOSIS — N952 Postmenopausal atrophic vaginitis: Secondary | ICD-10-CM

## 2013-09-14 DIAGNOSIS — B3731 Acute candidiasis of vulva and vagina: Secondary | ICD-10-CM

## 2013-09-14 MED ORDER — FLUCONAZOLE 150 MG PO TABS: 150.0000 mg | ORAL_TABLET | Freq: Once | ORAL | Status: DC

## 2013-09-14 MED ORDER — ESTROGENS, CONJUGATED 0.625 MG/GM VA CREA: TOPICAL_CREAM | VAGINAL | Status: DC

## 2013-09-14 NOTE — Progress Notes (Signed)
Patient ID: Gabriella Love, female   DOB: [birth year], 58 y.o.   MRN: [id number] Pt with heavy WBC infiltrate cervicitis/vaginitis which has responded to 10 days of metro gel Has yeast infection now which is a sure sign of a low pH environment  Will now treat with diflucan for 2 doses  Will also add some vaginal estrogen due to atrophic vaginitis  Past Medical History  Diagnosis Date  . Hypertension   . Hyperlipidemia   . Heart disease     Past Surgical History  Procedure Laterality Date  . Tubal ligation    . Cholecystectomy    . Coronary stent placement      OB History   Grav Para Term Preterm Abortions TAB SAB Ect Mult Living                  Allergies  Allergen Reactions  . Amoxicillin Nausea And Vomiting  . Choline Fenofibrate     REACTION: Abd pain and elevated Lipase  . Oxycodone-Acetaminophen     History   Social History  . Marital Status: Single    Spouse Name: N/A    Number of Children: N/A  . Years of Education: N/A   Social History Main Topics  . Smoking status: Current Every Day Smoker -- 0.50 packs/day  . Smokeless tobacco: Never Used  . Alcohol Use: No  . Drug Use: No  . Sexual Activity: Yes   Other Topics Concern  . None   Social History Narrative  . None    Family History  Problem Relation Age of Onset  . Cancer Mother     colon  . Depression Mother   . Heart disease Father   . Depression Sister   . Cancer Sister     leukemia  . Hyperlipidemia Brother   . Hypertension Brother

## 2013-12-12 ENCOUNTER — Ambulatory Visit (INDEPENDENT_AMBULATORY_CARE_PROVIDER_SITE_OTHER): Payer: 59 | Admitting: Obstetrics & Gynecology

## 2013-12-12 ENCOUNTER — Encounter: Payer: Self-pay | Admitting: Obstetrics & Gynecology

## 2013-12-12 VITALS — BP 118/80 | Ht 61.4 in | Wt 124.0 lb

## 2013-12-12 DIAGNOSIS — N760 Acute vaginitis: Secondary | ICD-10-CM

## 2013-12-12 DIAGNOSIS — N72 Inflammatory disease of cervix uteri: Secondary | ICD-10-CM

## 2013-12-12 NOTE — Progress Notes (Signed)
   Subjective:    Patient ID: Gabriella Love, female    DOB: [birth year], 58 y.o.   MRN: [id number]  HPI Pt is seen in follow up for her appointment in 08/2013 for heavy WBC infiltrative cervicitis/vaginitis and primarily Due to atrophic vaginitis.  Dyspareunia and vulvodynia  Since then has much fewer symptoms, no dyspareunia, no bleeing with intercourse, no odor or itchy burning discharge   Review of Systems No burning with urination, frequency or urgency No nausea, vomiting or diarrhea Nor fever chills or other constitutional symptoms     Objective:   Physical Exam  NEFG Vagina much better ruggae good moisture No abnormal discharge Cervix normal      Assessment & Plan:  Improving atrophic vaginal changes  Continue qohs premarin vaginal cream  Yearly exam in 6 months

## 2014-03-05 ENCOUNTER — Other Ambulatory Visit (HOSPITAL_COMMUNITY): Payer: Self-pay | Admitting: Internal Medicine

## 2014-03-05 DIAGNOSIS — M5410 Radiculopathy, site unspecified: Secondary | ICD-10-CM

## 2014-03-08 ENCOUNTER — Ambulatory Visit (HOSPITAL_COMMUNITY)
Admission: RE | Admit: 2014-03-08 | Discharge: 2014-03-08 | Disposition: A | Discharge status: Home / Self Care | Payer: 59 | Source: Ambulatory Visit | Attending: Internal Medicine | Admitting: Internal Medicine

## 2014-03-08 DIAGNOSIS — M5410 Radiculopathy, site unspecified: Secondary | ICD-10-CM

## 2014-03-08 DIAGNOSIS — M47812 Spondylosis without myelopathy or radiculopathy, cervical region: Secondary | ICD-10-CM | POA: Insufficient documentation

## 2014-03-08 DIAGNOSIS — M5412 Radiculopathy, cervical region: Secondary | ICD-10-CM | POA: Diagnosis not present

## 2014-11-20 ENCOUNTER — Emergency Department (HOSPITAL_COMMUNITY)
Admission: EM | Admit: 2014-11-20 | Discharge: 2014-11-20 | Disposition: A | Discharge status: Home / Self Care | Payer: Worker's Compensation | Attending: Emergency Medicine | Admitting: Emergency Medicine

## 2014-11-20 ENCOUNTER — Encounter (HOSPITAL_COMMUNITY): Payer: Self-pay

## 2014-11-20 ENCOUNTER — Emergency Department (HOSPITAL_COMMUNITY): Payer: Worker's Compensation

## 2014-11-20 DIAGNOSIS — Z720 Tobacco use: Secondary | ICD-10-CM | POA: Insufficient documentation

## 2014-11-20 DIAGNOSIS — Y9289 Other specified places as the place of occurrence of the external cause: Secondary | ICD-10-CM | POA: Insufficient documentation

## 2014-11-20 DIAGNOSIS — Z7982 Long term (current) use of aspirin: Secondary | ICD-10-CM | POA: Insufficient documentation

## 2014-11-20 DIAGNOSIS — Y9389 Activity, other specified: Secondary | ICD-10-CM | POA: Insufficient documentation

## 2014-11-20 DIAGNOSIS — S8992XA Unspecified injury of left lower leg, initial encounter: Secondary | ICD-10-CM | POA: Diagnosis present

## 2014-11-20 DIAGNOSIS — Z880 Allergy status to penicillin: Secondary | ICD-10-CM | POA: Diagnosis not present

## 2014-11-20 DIAGNOSIS — Z792 Long term (current) use of antibiotics: Secondary | ICD-10-CM | POA: Insufficient documentation

## 2014-11-20 DIAGNOSIS — W01198A Fall on same level from slipping, tripping and stumbling with subsequent striking against other object, initial encounter: Secondary | ICD-10-CM | POA: Diagnosis not present

## 2014-11-20 DIAGNOSIS — Z79899 Other long term (current) drug therapy: Secondary | ICD-10-CM | POA: Diagnosis not present

## 2014-11-20 DIAGNOSIS — M25462 Effusion, left knee: Secondary | ICD-10-CM

## 2014-11-20 DIAGNOSIS — Y990 Civilian activity done for income or pay: Secondary | ICD-10-CM | POA: Diagnosis not present

## 2014-11-20 NOTE — ED Provider Notes (Signed)
CSN: [id number]     Arrival date & time 11/20/14  1958 History   First MD Initiated Contact with Patient 11/20/14 2144     Chief Complaint  Patient presents with  . Knee Injury     (Consider location/radiation/quality/duration/timing/severity/associated sxs/prior Treatment) Patient is a 59 y.o. female presenting with knee pain. The history is provided by the patient.  Knee Pain Location:  Knee Time since incident:  1 day Injury: yes   Mechanism of injury: fall   Fall:    Fall occurred:  Tripped   Impact surface:  Hard floor   Point of impact:  Knees   Entrapped after fall: no   Knee location:  L knee Pain details:    Quality:  Aching and throbbing   Radiates to:  L leg   Severity:  Moderate   Onset quality:  Sudden   Timing:  Constant   Progression:  Worsening Chronicity:  New Dislocation: no   Foreign body present:  No foreign bodies Prior injury to area:  No Worsened by:  Bearing weight and extension Associated symptoms: swelling    Gabriella Love is a 59 y.o. female who presents to the ED with left knee pain. She states that she was at work yesterday and fell over a rug and hit on her knees. The pain and swelling of the knee has increased today. She took aleve and it did help the pain.   Past Medical History  Diagnosis Date  . Hypertension   . Hyperlipidemia   . Heart disease    Past Surgical History  Procedure Laterality Date  . Tubal ligation    . Cholecystectomy    . Coronary stent placement     Family History  Problem Relation Age of Onset  . Cancer Mother     colon  . Depression Mother   . Heart disease Father   . Depression Sister   . Cancer Sister     leukemia  . Hyperlipidemia Brother   . Hypertension Brother    History  Substance Use Topics  . Smoking status: Current Every Day Smoker -- 0.50 packs/day  . Smokeless tobacco: Never Used  . Alcohol Use: No   OB History    No data available     Review of Systems Negative except as  stated in HPI   Allergies  Amoxicillin; Choline fenofibrate; and Oxycodone-acetaminophen  Home Medications   Prior to Admission medications   Medication Sig Start Date End Date Taking? Authorizing Provider  aspirin 325 MG tablet Take 325 mg by mouth daily.      Historical Provider, MD  buPROPion (WELLBUTRIN XL) 150 MG 24 hr tablet Take 150 mg by mouth daily.    Historical Provider, MD  conjugated estrogens (PREMARIN) vaginal cream 1 gram nightly 09/14/13   Florian Buff, MD  Diphenhyd-Hydrocort-Nystatin (FIRST-DUKES MOUTHWASH) SUSP Use as directed 5 mLs in the mouth or throat 3 (three) times daily. 08/15/13   Florian Buff, MD  doxycycline (VIBRAMYCIN) 50 MG capsule Take 2 capsules (100 mg total) by mouth 2 (two) times daily. 08/15/13   Florian Buff, MD  fluconazole (DIFLUCAN) 150 MG tablet Take 1 tablet (150 mg total) by mouth once. Take the second tablet 3 days after the first one. 09/14/13   Florian Buff, MD  metoprolol succinate (TOPROL-XL) 25 MG 24 hr tablet Take 25 mg by mouth daily.      Historical Provider, MD  metroNIDAZOLE (METROGEL VAGINAL) 0.75 % vaginal gel Nightly  x 5 nights 08/30/13   Florian Buff, MD  metroNIDAZOLE (METROGEL) 0.75 % gel Apply 1 application topically 2 (two) times daily. 08/15/13   Florian Buff, MD  metroNIDAZOLE (METROGEL) 0.75 % vaginal gel APPLY NIGHTLY FOR 5 NIGHTS 08/31/13   Florian Buff, MD  nitroGLYCERIN (NITROSTAT) 0.4 MG SL tablet Place 0.4 mg under the tongue every 5 (five) minutes as needed.      Historical Provider, MD  pantoprazole (PROTONIX) 40 MG tablet Take 40 mg by mouth daily.    Historical Provider, MD  ranitidine (ZANTAC) 150 MG capsule Take 1 capsule (150 mg total) by mouth 2 (two) times daily. 02/26/11 02/26/12  Alycia Rossetti, MD  simvastatin (ZOCOR) 40 MG tablet Take 40 mg by mouth at bedtime.      Historical Provider, MD   BP 130/73 mmHg  Pulse 69  Temp(Src) 99.2 F (37.3 C) (Oral)  Resp 12  Ht 5\' 2"  (1.575 m)  Wt 124 lb (56.246 kg)   BMI 22.67 kg/m2  SpO2 100% Physical Exam  Constitutional: She is oriented to person, place, and time. She appears well-developed and well-nourished.  HENT:  Head: Normocephalic and atraumatic.  Eyes: EOM are normal.  Neck: Neck supple.  Cardiovascular: Normal rate.   Pulmonary/Chest: Effort normal.  Musculoskeletal:       Left knee: She exhibits swelling. She exhibits no deformity, no laceration, no erythema, normal alignment and normal patellar mobility. Decreased range of motion: due to pain. Tenderness found.       Legs: Pedal pulses 2+ bilateral, adequate circulation, good touch sensation.   Neurological: She is alert and oriented to person, place, and time. No cranial nerve deficit.  Skin: Skin is warm and dry.  Psychiatric: She has a normal mood and affect. Her behavior is normal.  Nursing note and vitals reviewed.   ED Course  Procedures (including critical care time) Labs Review Labs Reviewed - No data to display  Imaging Review Dg Knee Complete 4 Views Left  11/20/2014   CLINICAL DATA:  Status post fall, with injury to the left knee. Initial encounter.  EXAM: LEFT KNEE - COMPLETE 4+ VIEW  COMPARISON:  None.  FINDINGS: There is no evidence of fracture or dislocation. The joint spaces are preserved. No significant degenerative change is seen; the patellofemoral joint is grossly unremarkable in appearance.  A small knee joint effusion is noted. The visualized soft tissues are otherwise unremarkable in appearance.  IMPRESSION: 1. No evidence of fracture or dislocation. 2. Small knee joint effusion noted.   Electronically Signed   By: Garald Balding M.D.   On: 11/20/2014 21:36    MDM  59 y.o. female with pain and swelling of the left knee s/p fall at work yesterday. Placed in knee immobilizer, ice, elevation, crutches and ibuprofen. Patient declined any Rx for pain. She will follow up with ortho. She will return here as needed. Stable for discharge without neurovascular compromise.    Final diagnoses:  Knee effusion, left       [facility], NP 11/20/14 2215  Milton Ferguson, MD 11/20/14 2245

## 2014-11-20 NOTE — ED Notes (Signed)
Patient states she fell yesterday morning at work and hurt left knee. Patient ambulatory but guarded into triage.

## 2014-11-20 NOTE — Discharge Instructions (Signed)
Follow up with Dr. Aline Brochure, return here as needed.

## 2014-11-21 ENCOUNTER — Telehealth: Payer: Self-pay | Admitting: Orthopedic Surgery

## 2014-11-21 NOTE — Telephone Encounter (Signed)
Patient called stating that she was in the ER at [facility] on 11/20/14 and she was advised to follow up with Dr. Aline Brochure Orthopedics due to a small knee joint effusion Lft knee, patient states that this will be Workers Comp, she fell at work, she is employed with [employer]. I explained to her that her employer will need to first contact us with workers compensation claim information and approval and once that is taken care of we will return her call to have her place on the schedule, pts ph # [phone]

## 2014-11-22 NOTE — Telephone Encounter (Signed)
The Workers comp forms and approval have been received.  ?Tuesday, 11/27/14 a.m?

## 2014-11-22 NOTE — Telephone Encounter (Signed)
Received call from patient's employer, [employer]; spoke with Lillia Corporal.  She states employee is at work (no loss of time from work).  Workers Comp forms have been faxed for employer and patient/employee to complete and return.  Patient is very concerned, due to what she was told by the Emergency Room physician at her visit at [facility] 11/19/14, regarding the nature of her problem.  Dr Aline Brochure - please review and advise of any urgency   - Employer Ph# [phone], Fax# [phone]

## 2014-11-22 NOTE — Telephone Encounter (Signed)
Let me know when W/C approved and I ll TELL YOU WHEN

## 2014-11-24 NOTE — Telephone Encounter (Signed)
Will have to do Monday the ninth  Final answer

## 2014-11-26 NOTE — Telephone Encounter (Signed)
Friday 11/23/14 called patient; left message to notify of status. Monday 11/26/14 - Per Dr Harrison's review, scheduled accordingly; called employer, spoke with Lillia Corporal to notify.

## 2014-12-03 ENCOUNTER — Encounter: Payer: Self-pay | Admitting: Orthopedic Surgery

## 2014-12-03 ENCOUNTER — Ambulatory Visit (INDEPENDENT_AMBULATORY_CARE_PROVIDER_SITE_OTHER): Payer: Worker's Compensation | Admitting: Orthopedic Surgery

## 2014-12-03 ENCOUNTER — Telehealth: Payer: Self-pay | Admitting: Orthopedic Surgery

## 2014-12-03 VITALS — BP 144/71 | Ht 62.0 in | Wt 124.0 lb

## 2014-12-03 DIAGNOSIS — T148 Other injury of unspecified body region: Secondary | ICD-10-CM | POA: Diagnosis not present

## 2014-12-03 DIAGNOSIS — T148XXA Other injury of unspecified body region, initial encounter: Secondary | ICD-10-CM

## 2014-12-03 MED ORDER — IBUPROFEN 800 MG PO TABS: 800.0000 mg | ORAL_TABLET | Freq: Three times a day (TID) | ORAL | Status: DC | PRN

## 2014-12-03 NOTE — Telephone Encounter (Signed)
Contacted insurer Workers Comp, [employer], Ph [phone], Ext [phone], to verify fax# to send notes, which include request for approval for authorization for MRI. Also spoke with Nurse case manager, Posey Pronto,  Ph [phone]; copy sent.

## 2014-12-03 NOTE — Patient Instructions (Addendum)
MRI LEFT KNEE BRACE FOR WALKING  REMOVE BRACE BEND KNEE 30 TIMES , 3 X A DAY  APPLY ICE 20 MINUTES 3 X A DAY

## 2014-12-04 ENCOUNTER — Encounter: Payer: Self-pay | Admitting: Orthopedic Surgery

## 2014-12-04 NOTE — Progress Notes (Signed)
Patient ID: Gabriella Love, female   DOB: [birth year], 59 y.o.   MRN: [id number] Workers compensation case new patient new problem  Chief Complaint  Patient presents with  . Knee Injury    Left knee injury, following up from [place]. DOI 11-19-14.    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 The left knee took the brunt of the fall she complains of anterior knee pain swelling trouble weightbearing loss of motion. Her pain has gotten better her confidence and strength in her knee has not. Symptoms worsened by weightbearing. Associated symptoms of locking and tingling numbness are not negative and she did have some initial bruising around the inferior aspect of the left knee.  Review of systems all other systems important no no skin changes. Neurologic symptoms negative as stated. Denies fever. Past Medical History  Diagnosis Date  . Hypertension   . Hyperlipidemia   . Heart disease     BP 144/71 mmHg  Ht 5\' 2"  (1.575 m)  Wt 124 lb (56.246 kg)  BMI 22.67 kg/m2 Her appearance is normal her body habitus is ectomorphic She is oriented 3 Mood is stable normal and pleasant Ambulatory status she is wearing a brace she is walking but with a slight limp Left knee is tender over the inferomedial and tibia and tibial tubercle, small joint effect effusion noted Active range of motion 120 passive 130 but painful Coronal plane stability collateral ligament stable, posterior drawer test normal. Anterior drawer test shows some instability Motor exam shows good quadriceps muscle tone and no atrophy Skin is normal Pulses are 2+ in each foot She has normal sensation in both feet   I interpret her films as negative for acute fracture  Encounter Diagnosis  Name Primary?  . Torn ligament Yes   Recommend MRI to rule out any intra-articular ligament injury.  She can return to work.  Note she has not missed any  work related to this injury  Meds ordered this encounter  Medications  . DISCONTD: ibuprofen (ADVIL,MOTRIN) 800 MG tablet    Sig: Take 1 tablet (800 mg total) by mouth every 8 (eight) hours as needed.    Dispense:  90 tablet    Refill:  5

## 2014-12-10 ENCOUNTER — Other Ambulatory Visit: Payer: Self-pay | Admitting: *Deleted

## 2014-12-10 DIAGNOSIS — T148XXA Other injury of unspecified body region, initial encounter: Secondary | ICD-10-CM

## 2014-12-10 NOTE — Telephone Encounter (Signed)
Fax received from Workers comp's 3rd party contact, [employer] (ph [phone]) with MRI appointment for today, 12/10/14, 6:15pm at [place], [place].- Called patient - she had been made aware by Workers comp; aware to request copy of film.

## 2014-12-13 ENCOUNTER — Encounter: Payer: Self-pay | Admitting: Orthopedic Surgery

## 2014-12-13 ENCOUNTER — Ambulatory Visit (INDEPENDENT_AMBULATORY_CARE_PROVIDER_SITE_OTHER): Payer: Worker's Compensation | Admitting: Orthopedic Surgery

## 2014-12-13 ENCOUNTER — Telehealth: Payer: Self-pay | Admitting: Orthopedic Surgery

## 2014-12-13 DIAGNOSIS — S82202A Unspecified fracture of shaft of left tibia, initial encounter for closed fracture: Secondary | ICD-10-CM | POA: Diagnosis not present

## 2014-12-13 DIAGNOSIS — T148 Other injury of unspecified body region: Secondary | ICD-10-CM | POA: Diagnosis not present

## 2014-12-13 DIAGNOSIS — T148XXA Other injury of unspecified body region, initial encounter: Secondary | ICD-10-CM

## 2014-12-13 NOTE — Telephone Encounter (Signed)
Notes, Date of service today, 12/13/14, faxed to Workers comp, [employer], fax# [phone] and copy to Nurse case manager, Posey Pronto, fax# [phone]/ph# [phone].

## 2014-12-13 NOTE — Progress Notes (Signed)
Follow-up visit  Left knee pain is the chief complaint  The patient was injured on April 25. She's continued working. The injury occurred at work. She's been in a soft brace taking ibuprofen and Tylenol for pain. She was sent for MRI she comes back in follow-up  MRI was done at [facility] I saw the disc.  She has several areas of degenerative changes which I do not believe are acute however she does have acute microfracture proximal tibia which correlates with her symptoms. The other findings which included a fusion popliteal cyst is high suspicion for small tear of posterior horn and body lateral meniscus and degenerative signal medial meniscus diminutive anterior cruciate ligament signal acute chronic extensor tendinosis and grade 2 and 3 chondromalacia patella and grade 12 chondromalacia lateral compartment are most likely old changes prior to the injury.  She complains that the knee will go backwards on her and has happened 6 or 7 times with occasional feeling like the knee is chronic give way  Clinically her symptoms are all in the proximal tibia She is awake she is alert she is oriented 3 her mood and affect are normal her appearance is normal she is well-groomed her body habitus is ectomorphic She comes in today walking with a soft brace. She has tenderness over the proximal tibia. Range of motion at the knee joint is normal. She does not have any neurovascular deficits  Recommend physical therapy  I placed her an economy hinged brace full range of motion weight bear as tolerated.  Continue working. Continue over-the-counter pain medication with Tylenol  Follow-up with me 6 weeks

## 2014-12-13 NOTE — Patient Instructions (Signed)
PT ARRANGED AFTER W/C APPROVES  WEAR BRACE FOR ALL WEIGHT BEARING ACTIVITY  RETURN IN 6 WEEK S

## 2015-01-24 ENCOUNTER — Encounter: Payer: Self-pay | Admitting: Orthopedic Surgery

## 2015-01-24 ENCOUNTER — Ambulatory Visit (INDEPENDENT_AMBULATORY_CARE_PROVIDER_SITE_OTHER): Payer: Worker's Compensation | Admitting: Orthopedic Surgery

## 2015-01-24 VITALS — BP 100/61 | Ht 62.0 in | Wt 124.0 lb

## 2015-01-24 DIAGNOSIS — S82202D Unspecified fracture of shaft of left tibia, subsequent encounter for closed fracture with routine healing: Secondary | ICD-10-CM

## 2015-01-24 NOTE — Patient Instructions (Signed)
BRACE KNEE AS NEEDED

## 2015-01-24 NOTE — Progress Notes (Signed)
Patient ID: Gabriella Love, female   DOB: 1955-11-18, 59 y.o.   MRN: [id number]  Follow up visit  Chief Complaint  Patient presents with  . Follow-up    6 week follow up left knee microfracture, DOI 11/19/14    BP 100/61 mmHg  Ht 5\' 2"  (1.575 m)  Wt 124 lb (56.246 kg)  BMI 22.67 kg/m2  Encounter Diagnosis  Name Primary?  . Left tibial fracture, closed, with routine healing, subsequent encounter Yes    This is a Designer, jewellery related injury. The patient is noted to have a left tibial microfracture. She's doing well in a hinged brace and with physical therapy. Her last therapy visit is tomorrow she can complete that and then will not need further therapy  Her complaint is of a sharp pain when she stands up out of the bathtub but she has no pain with ambulation sitting standing or any other activities  Her review of systems is negative for mechanical symptoms in the left knee  Today's examination reveals no tenderness in the knee some mild crepitance in the patella for range of motion no swelling ligaments are all stable she has a normal straight leg raise and her neurovascular exam is normal  Impression microfracture left tibial plateau diagnosed by MRI  Patient will follow up in 6 weeks for me to reevaluate the pain she's having getting out of the bathtub but I suspect this will improve over time as well. Brace can be as needed no work notes were needed.

## 2015-03-07 ENCOUNTER — Encounter: Payer: Self-pay | Admitting: Orthopedic Surgery

## 2015-03-07 ENCOUNTER — Ambulatory Visit (INDEPENDENT_AMBULATORY_CARE_PROVIDER_SITE_OTHER): Payer: Worker's Compensation | Admitting: Orthopedic Surgery

## 2015-03-07 VITALS — BP 141/76 | Ht 62.0 in | Wt 117.0 lb

## 2015-03-07 DIAGNOSIS — M2242 Chondromalacia patellae, left knee: Secondary | ICD-10-CM

## 2015-03-07 DIAGNOSIS — S82202D Unspecified fracture of shaft of left tibia, subsequent encounter for closed fracture with routine healing: Secondary | ICD-10-CM

## 2015-03-07 DIAGNOSIS — T148XXA Other injury of unspecified body region, initial encounter: Secondary | ICD-10-CM

## 2015-03-07 DIAGNOSIS — T148 Other injury of unspecified body region: Secondary | ICD-10-CM | POA: Diagnosis not present

## 2015-03-07 NOTE — Progress Notes (Signed)
Patient ID: Gabriella Love, female   DOB: [birth year], 59 y.o.   MRN: [id number]  Follow up visit  Chief Complaint  Patient presents with  . Follow-up    6 week follow up left knee microfracture, DOI 11/19/14    BP 141/76 mmHg  Ht 5\' 2"  (1.575 m)  Wt 117 lb (53.071 kg)  BMI 21.39 kg/m2  Encounter Diagnoses  Name Primary?  . Left tibial fracture, closed, with routine healing, subsequent encounter Yes  . Contusion of bone   . Chondromalacia of left patella     This patient had a work-related injury we thought she had a microfracture which was documented on MRI we brought her back because she is having trouble getting out of a deep-seated positions and climbing steps. She still having that sensation. Her initial injury was 11/19/2014. She can do most every thing else. Review of systems negative for mechanical symptoms  Exam shows tenderness under the patella mid pole to medial facet with crepitance on range of motion. Ligaments are stable straight leg raise is normal neurovascular exam is intact ambulation is normal and vital signs are stable. McMurray sign is negative. Mild patellar inhibition but no apprehension  Follow-up 3 months. I would not recommend surgery for this. It usually does not improve the situation. She has finished her physical therapy and can continue with home exercises. I have not released her from her workers compensation claim from a medical standpoint other than she can return to work normal activity I would like to follow this through the see if he gets better on its own.

## 2015-03-14 ENCOUNTER — Telehealth: Payer: Self-pay | Admitting: Orthopedic Surgery

## 2015-03-14 ENCOUNTER — Encounter: Payer: Self-pay | Admitting: Orthopedic Surgery

## 2015-03-14 NOTE — Telephone Encounter (Signed)
Notes (dates of service 01/24/15 and 03/07/15) faxed to Workers comp, attention to Nurse case Freight forwarder, Posey Pronto RN, CCM, ph#[phone]/fax#[phone].

## 2015-06-06 ENCOUNTER — Ambulatory Visit: Payer: Self-pay | Admitting: Orthopedic Surgery

## 2015-06-13 ENCOUNTER — Encounter: Payer: Self-pay | Admitting: Orthopedic Surgery

## 2015-06-13 ENCOUNTER — Ambulatory Visit (INDEPENDENT_AMBULATORY_CARE_PROVIDER_SITE_OTHER): Payer: Worker's Compensation | Admitting: Orthopedic Surgery

## 2015-06-13 VITALS — BP 122/81 | Ht 62.0 in | Wt 119.0 lb

## 2015-06-13 DIAGNOSIS — T148XXA Other injury of unspecified body region, initial encounter: Secondary | ICD-10-CM

## 2015-06-13 DIAGNOSIS — S82202D Unspecified fracture of shaft of left tibia, subsequent encounter for closed fracture with routine healing: Secondary | ICD-10-CM | POA: Diagnosis not present

## 2015-06-13 DIAGNOSIS — M2242 Chondromalacia patellae, left knee: Secondary | ICD-10-CM | POA: Diagnosis not present

## 2015-06-13 DIAGNOSIS — T148 Other injury of unspecified body region: Secondary | ICD-10-CM

## 2015-06-13 NOTE — Progress Notes (Signed)
  Chief Complaint  Patient presents with  . 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 The left knee took the brunt of the fall she complains of anterior knee pain swelling trouble weightbearing loss of motion. Her pain has gotten better her confidence and strength in her knee has not. Symptoms worsened by weightbearing. Associated symptoms of locking and tingling numbness are negative and she did have some initial bruising around the inferior aspect of the left knee.  Primary complaints today are difficulty getting out of a seated position difficulty climbing steps. Pain and swelling over the medial sartorius bursa intermittent symptoms relieved by Tylenol arthritis  Today's examination she has normal ambulatory pattern. Right to left knees looked the same with no swelling. She has tenderness over the peds anserine bursa on the left none on the right both knees are stable motor exam normal in both lower extremities quadriceps without atrophy skin intact neurovascular exam normal  She does have medial facet tenderness positive crepitance on patellofemoral testing and positive patellar compression and inhibition test. McMurray sign is negative and she has no restriction in motion.  The MRI she had back in May showed anterior tibial subchondral microfracture acute  Chondromalacia medial patellar facet  Final diagnosis Microfracture proximal tibia left knee Chondromalacia patella left knee Bursitis pes anserine tendons and tendinitis  The 25R industrial commission [employer] rating system for leg left knee provides for 3% impairment based on the persistent pain she has without range of motion restriction, permanent contracture, or permanent subluxation of the joint.. While we note the  cartilage thinning and fibrillation of the medial patellar facet this is most chronic and degenerative in nature.   Encounter Diagnoses  Name Primary?  . Contusion of bone   . Chondromalacia of left patella   . Left tibial fracture, closed, with routine healing, subsequent encounter Yes   The patient has been released with a MMI  today.

## 2015-07-18 ENCOUNTER — Telehealth: Payer: Self-pay | Admitting: Orthopedic Surgery

## 2015-07-18 NOTE — Telephone Encounter (Signed)
Per request - Workers comp nurse case Freight forwarder, Philip Aspen RN, fax# [phone], and Workers comp insurer, [employer], fax# [phone], most recent notes for date of service 06-13-15, as well as Updated Form 25R, faxed to both #'s.

## 2015-08-14 ENCOUNTER — Encounter: Payer: Self-pay | Admitting: Orthopedic Surgery

## 2016-02-06 ENCOUNTER — Ambulatory Visit (HOSPITAL_COMMUNITY)
Admission: RE | Admit: 2016-02-06 | Discharge: 2016-02-06 | Disposition: A | Discharge status: Home / Self Care | Payer: Commercial Managed Care - HMO | Source: Ambulatory Visit | Attending: Internal Medicine | Admitting: Internal Medicine

## 2016-02-06 ENCOUNTER — Other Ambulatory Visit (HOSPITAL_COMMUNITY): Payer: Self-pay | Admitting: Internal Medicine

## 2016-02-06 DIAGNOSIS — M79672 Pain in left foot: Secondary | ICD-10-CM | POA: Insufficient documentation

## 2016-02-06 DIAGNOSIS — T1490XA Injury, unspecified, initial encounter: Secondary | ICD-10-CM

## 2016-02-06 DIAGNOSIS — X58XXXA Exposure to other specified factors, initial encounter: Secondary | ICD-10-CM | POA: Diagnosis not present

## 2016-02-06 DIAGNOSIS — S92515A Nondisplaced fracture of proximal phalanx of left lesser toe(s), initial encounter for closed fracture: Secondary | ICD-10-CM | POA: Insufficient documentation

## 2016-07-08 ENCOUNTER — Encounter: Payer: Self-pay | Admitting: Obstetrics and Gynecology

## 2016-07-08 ENCOUNTER — Ambulatory Visit (INDEPENDENT_AMBULATORY_CARE_PROVIDER_SITE_OTHER): Payer: Commercial Managed Care - HMO | Admitting: Obstetrics and Gynecology

## 2016-07-08 ENCOUNTER — Encounter (INDEPENDENT_AMBULATORY_CARE_PROVIDER_SITE_OTHER): Payer: Self-pay

## 2016-07-08 VITALS — BP 110/78 | HR 89 | Wt 125.0 lb

## 2016-07-08 DIAGNOSIS — R590 Localized enlarged lymph nodes: Secondary | ICD-10-CM

## 2016-07-08 DIAGNOSIS — B356 Tinea cruris: Secondary | ICD-10-CM | POA: Diagnosis not present

## 2016-07-08 MED ORDER — KETOCONAZOLE 2 % EX CREA: 1.0000 "application " | TOPICAL_CREAM | Freq: Every day | CUTANEOUS | 1 refills | Status: DC

## 2016-07-08 NOTE — Progress Notes (Addendum)
[facility] Visit  07/08/16            Patient name: Gabriella Love MRN [id number]  Date of birth: 1956-03-26  CC & HPI:  Gabriella Love is a 61 y.o. female presenting today for an area of swelling to the left inguinal area which she believes to be a swollen lymph node. She states she found this 10 days ago while showering and denies feeling something similar in the past. She denies any known infections or abscesses to the lower part of the body.   ROS:  ROS +swollen lymph node Otherwise negative Family history significant for a sister who died of leukemia last year Patient currently unemployed insurance runs for 31 December Pertinent History Reviewed:   Reviewed: Significant for [employer]         Past Medical History:  Diagnosis Date  . Heart disease   . Hyperlipidemia   . Hypertension                               Surgical Hx:    Past Surgical History:  Procedure Laterality Date  . CHOLECYSTECTOMY    . CORONARY STENT PLACEMENT    . TUBAL LIGATION     Medications: Reviewed & Updated - see associated section                       Current Outpatient Prescriptions:  .  aspirin 325 MG tablet, Take 325 mg by mouth daily.  , Disp: , Rfl:  .  buPROPion (WELLBUTRIN SR) 150 MG 12 hr tablet, Take 150 mg by mouth., Disp: , Rfl:  .  fluticasone (FLONASE) 50 MCG/ACT nasal spray, Place into both nostrils daily., Disp: , Rfl:  .  metoprolol succinate (TOPROL-XL) 25 MG 24 hr tablet, Take 25 mg by mouth daily.  , Disp: , Rfl:  .  nitroGLYCERIN (NITROSTAT) 0.4 MG SL tablet, Place 0.4 mg under the tongue every 5 (five) minutes as needed.  , Disp: , Rfl:  .  pantoprazole (PROTONIX) 40 MG tablet, Take 40 mg by mouth daily., Disp: , Rfl:  .  simvastatin (ZOCOR) 40 MG tablet, Take 40 mg by mouth at bedtime.  , Disp: , Rfl:  .  ranitidine (ZANTAC) 150 MG capsule, Take 1 capsule (150 mg total) by mouth 2 (two) times daily., Disp: 60 capsule, Rfl: 3   Social History: Reviewed -   reports that she has been smoking.  She has been smoking about 0.50 packs per day. She has never used smokeless tobacco.  Objective Findings:  Vitals: Blood pressure 110/78, pulse 89, weight 125 lb (56.7 kg).  Physical Examination:  Groin: rash to the inguinal skin folds consistent with tinea cruris. No hernia noted. Non-tender 2 cm left inguinal lymph node present Pelvic - VULVA: normal appearing vulva with no masses, tenderness or lesions,  VAGINA: normal appearing vagina with normal color and discharge, no lesions,  CERVIX: tiny and atrophic, smaller since she is post-menopausal  UTERUS: first degree uterine descensus    Assessment & Plan:   A:  1. 2 cm left inguinal lymph node 2.Tinea cruris  3 history coronary artery disease with stent in place 4. Family history of leukemia  P:  1. Treat with ketoconazole For perineal fungal infection 2. Follow up with general surgery for diagnostic excisional biopsy of left inguinal lymph nodes    By signing my name  below, I, Sonum Patel, attest that this documentation has been prepared under the direction and in the presence of Jonnie Kind, MD. Electronically Signed: Sonum Patel, Education administrator. 07/08/16. 12:11 PM.  I personally performed the services described in this documentation, which was SCRIBED in my presence. The recorded information has been reviewed and considered accurate. It has been edited as necessary during review. Jonnie Kind, MD

## 2016-07-09 ENCOUNTER — Encounter (HOSPITAL_COMMUNITY)
Admission: RE | Admit: 2016-07-09 | Discharge: 2016-07-09 | Disposition: A | Discharge status: Home / Self Care | Payer: Commercial Managed Care - HMO | Source: Ambulatory Visit | Attending: Surgery | Admitting: Surgery

## 2016-07-09 ENCOUNTER — Encounter (HOSPITAL_COMMUNITY): Payer: Self-pay

## 2016-07-09 ENCOUNTER — Ambulatory Visit (HOSPITAL_COMMUNITY)
Admission: RE | Admit: 2016-07-09 | Discharge: 2016-07-09 | Disposition: A | Discharge status: Home / Self Care | Payer: Commercial Managed Care - HMO | Source: Ambulatory Visit | Attending: Surgery | Admitting: Surgery

## 2016-07-09 DIAGNOSIS — R9431 Abnormal electrocardiogram [ECG] [EKG]: Secondary | ICD-10-CM | POA: Diagnosis not present

## 2016-07-09 DIAGNOSIS — R590 Localized enlarged lymph nodes: Secondary | ICD-10-CM

## 2016-07-09 DIAGNOSIS — Z01811 Encounter for preprocedural respiratory examination: Secondary | ICD-10-CM | POA: Insufficient documentation

## 2016-07-09 DIAGNOSIS — Z01810 Encounter for preprocedural cardiovascular examination: Secondary | ICD-10-CM | POA: Insufficient documentation

## 2016-07-09 DIAGNOSIS — Z01812 Encounter for preprocedural laboratory examination: Secondary | ICD-10-CM | POA: Diagnosis not present

## 2016-07-09 DIAGNOSIS — I700 Atherosclerosis of aorta: Secondary | ICD-10-CM | POA: Insufficient documentation

## 2016-07-09 HISTORY — DX: Unspecified osteoarthritis, unspecified site: M19.90

## 2016-07-09 HISTORY — DX: Acute myocardial infarction, unspecified: I21.9

## 2016-07-09 HISTORY — DX: Anxiety disorder, unspecified: F41.9

## 2016-07-09 HISTORY — DX: Gastro-esophageal reflux disease without esophagitis: K21.9

## 2016-07-09 LAB — BASIC METABOLIC PANEL
ANION GAP: 9 (ref 5–15)
BUN: 14 mg/dL (ref 6–20)
CALCIUM: 9.6 mg/dL (ref 8.9–10.3)
CHLORIDE: 104 mmol/L (ref 101–111)
CO2: 24 mmol/L (ref 22–32)
Creatinine, Ser: 0.78 mg/dL (ref 0.44–1.00)
GFR calc non Af Amer: >60 mL/min (ref >60)
GLUCOSE: 95 mg/dL (ref 65–99)
POTASSIUM: 3.8 mmol/L (ref 3.5–5.1)
Sodium: 137 mmol/L (ref 135–145)

## 2016-07-09 LAB — CBC WITH DIFFERENTIAL/PLATELET
BASOS PCT: 1 %
Basophils Absolute: 0.1 10*3/uL (ref 0.0–0.1)
Eosinophils Absolute: 0.1 10*3/uL (ref 0.0–0.7)
Eosinophils Relative: 1 %
HEMATOCRIT: 47.5 % — AB (ref 36.0–46.0)
HEMOGLOBIN: 16.1 g/dL — AB (ref 12.0–15.0)
LYMPHS ABS: 3.3 10*3/uL (ref 0.7–4.0)
LYMPHS PCT: 36 %
MCH: 32.2 pg (ref 26.0–34.0)
MCHC: 33.9 g/dL (ref 30.0–36.0)
MCV: 95 fL (ref 78.0–100.0)
MONO ABS: 0.7 10*3/uL (ref 0.1–1.0)
MONOS PCT: 8 %
NEUTROS ABS: 5.1 10*3/uL (ref 1.7–7.7)
NEUTROS PCT: 54 %
Platelets: 186 10*3/uL (ref 150–400)
RBC: 5 MIL/uL (ref 3.87–5.11)
RDW: 13.2 % (ref 11.5–15.5)
WBC: 9.2 10*3/uL (ref 4.0–10.5)

## 2016-07-09 NOTE — Patient Instructions (Signed)
Gabriella Love  07/09/2016     @PREFPERIOPPHARMACY @   Your procedure is scheduled on  07/13/2016   Report to [facility] at  615  A.M.  Call this number if you have problems the morning of surgery:  [phone]   Remember:  Do not eat food or drink liquids after midnight.  Take these medicines the morning of surgery with A SIP OF WATER  Xanax, wellbutrin, metoprolol, protonix.   Do not wear jewelry, make-up or nail polish.  Do not wear lotions, powders, or perfumes, or deoderant.  Do not shave 48 hours prior to surgery.  Men may shave face and neck.  Do not bring valuables to the hospital.  [facility] is not responsible for any belongings or valuables.  Contacts, dentures or bridgework may not be worn into surgery.  Leave your suitcase in the car.  After surgery it may be brought to your room.  For patients admitted to the hospital, discharge time will be determined by your treatment team.  Patients discharged the day of surgery will not be allowed to drive home.   Name and phone number of your driver:   family Special instructions:  none  Please read over the following fact sheets that you were given. Anesthesia Post-op Instructions and Care and Recovery After Surgery       Open Lymph Node Biopsy Introduction An open lymph node biopsy is a procedure to remove a lymph node so that it can be examined under a microscope. Lymph nodes are part of the body's disease-fighting (immune) system. The immune system protects the body from infections, germs, and diseases. An open lymph node biopsy may be done to:  Look for germs or cancer cells in your lymph node.  Find out why your lymph node is swollen.  Find out more about a condition you have. Lymph nodes are found in many locations in the body. Biopsies are often done on lymph nodes in the head, neck, armpit, or groin. Tell a health care provider about:  Any allergies you have.  All medicines you are  taking, including vitamins, herbs, eye drops, creams, and over-the-counter medicines.  Any problems you or family members have had with anesthetic medicines.  Any blood disorders you have.  Any surgeries you have had.  Any medical conditions you have.  Whether you are pregnant or may be pregnant. What are the risks? Generally, this is a safe procedure. However, problems may occur, including:  Infection.  Bleeding.  Allergic reactions to medicines.  Damage to other structures or organs, such as a nerve.  Scarring. What happens before the procedure?  Follow instructions from your health care provider about eating or drinking restrictions.  Ask your health care provider about:  Changing or stopping your regular medicines. This is especially important if you are taking diabetes medicines or blood thinners.  Taking medicines such as aspirin and ibuprofen. These medicines can thin your blood. Do not take these medicines before your procedure if your health care provider instructs you not to.  Ask your health care provider how your surgical site will be marked or identified.  You may be given antibiotic medicine to help prevent infection.  You may have an exam or testing.  You may have a blood or urine sample taken.  Plan to have someone take you home after the procedure.  If you will be going home right after the procedure, plan to have someone with  you for 24 hours. What happens during the procedure?  An IV tube will be inserted into one of your veins.  To reduce your risk of infection:  Your health care team will wash or sanitize their hands.  The skin where your lymph node is located will be cleaned with a germ-killing (antiseptic) solution.  You will be given medicine (local anesthetic) to numb the area around your lymph node. You may also be given medicine to help you relax (sedative).  An incision will be made in the area where your lymph node is  located.  Your lymph node will be removed.  Your incision will be closed with stitches (sutures).  An antibiotic ointment may be applied to your incision.  A bandage (dressing) will be placed over your incision. What happens after the procedure?  Do not drive for 24 hours if you received a sedative.  Your blood pressure, heart rate, breathing rate, and blood oxygen level will be monitored often until the medicines you were given have worn off.  You may have to wear compression stockings. These stockings help to prevent blood clots and reduce swelling in your legs. This information is not intended to replace advice given to you by your health care provider. Make sure you discuss any questions you have with your health care provider. Document Released: 08/09/2015 Document Revised: 12/19/2015 Document Reviewed: 11/07/2014  2017 Elsevier  Open Lymph Node Biopsy, Care After Introduction Refer to this sheet in the next few weeks. These instructions provide you with information about caring for yourself after your procedure. Your health care provider may also give you more specific instructions. Your treatment has been planned according to current medical practices, but problems sometimes occur. Call your health care provider if you have any problems or questions after your procedure. What can I expect after the procedure? After the procedure, it is common to have:  Bruising.  Soreness.  Mild swelling. Follow these instructions at home: Medicines  Take over-the-counter and prescription medicines only as told by your health care provider.  If you were prescribed an antibiotic medicine, take it as told by your health care provider. Do not stop taking the antibiotic even if you start to feel better. Incision care  Follow instructions from your health care provider about how to take care of your incision. Make sure you:  Wash your hands with soap and water before you change your  bandage (dressing). If soap and water are not available, use hand sanitizer.  Change your dressing as told by your health care provider.  Leave stitches (sutures), skin glue, or adhesive strips in place. These skin closures may need to stay in place for 2 weeks or longer. If adhesive strip edges start to loosen and curl up, you may trim the loose edges. Do not remove adhesive strips completely unless your health care provider tells you to do that.  Check your incision area every day for signs of infection. Check for:  More redness, swelling, or pain.  More fluid or blood.  Warmth.  Pus or a bad smell. Driving  Do not drive for 24 hours if you received a sedative.  Do not drive or operate heavy machinery while taking prescription pain medicine. General instructions  It is your responsibility to get the results of your procedure. Ask your health care provider or the department performing the procedure when your results will be ready.  Return to your normal activities as told by your health care provider. Ask your health  care provider what activities are safe for you.  Do not take baths, swim, or use a hot tub until your health care provider approves.  Wear compression stockings as told by your health care provider. These stockings help to prevent blood clots and reduce swelling in your legs.  Keep all follow-up visits as told by your health care provider. This is important. Contact a health care provider if:  You have more redness, swelling, or pain around your incision.  You have more fluid or blood coming from your incision.  Your incision feels warm to the touch.  You have pus or a bad smell coming from your incision.  You have a fever.  You have pain or numbness that gets worse or lasts longer than a few days. This information is not intended to replace advice given to you by your health care provider. Make sure you discuss any questions you have with your health care  provider. Document Released: 08/09/2015 Document Revised: 12/19/2015 Document Reviewed: 11/07/2014  2017 Elsevier "PATIENT INSTRUCTIONS  POST-ANESTHESIA  IMMEDIATELY FOLLOWING SURGERY:  Do not drive or operate machinery for the first twenty four hours after surgery.  Do not make any important decisions for twenty four hours after surgery or while taking narcotic pain medications or sedatives.  If you develop intractable nausea and vomiting or a severe headache please notify your doctor immediately.  FOLLOW-UP:  Please make an appointment with your surgeon as instructed. You do not need to follow up with anesthesia unless specifically instructed to do so.  WOUND CARE INSTRUCTIONS (if applicable):  Keep a dry clean dressing on the anesthesia/puncture wound site if there is drainage.  Once the wound has quit draining you may leave it open to air.  Generally you should leave the bandage intact for twenty four hours unless there is drainage.  If the epidural site drains for more than 36-48 hours please call the anesthesia department.  QUESTIONS?:  Please feel free to call your physician or the hospital operator if you have any questions, and they will be happy to assist you.

## 2016-07-12 NOTE — H&P (Signed)
[facility] Surgical History and Physical  Subjective: 60 year old female presents forevaluation of a large non-painful firm Left groin mass that patient noticed 11 days ago while taking a bath. She denies any recent infections, trauma/injuries, or similar prior masses and otherwise denies any fever/chills, CP, or SOB.   Review of Symptoms:  Constitutional:No fevers, chills, or unexplained weight loss Head:Atraumatic; no masses; no abnormalities Eyes:No visual changes or eye pain Nose/Mouth/Throat:No nasal congestion, rhinorrhea, oral lesions, postnasal drip or sore throat  Cardiovascular: No chest pain or palpitations.  Respiratory:No cough, shortness of breath or wheezing  GastrointestinNo diarrhea, constipation, blood in stools, abdominal pain, vomiting or heartburn Genitourinary:No urinary frequency, hematuria, incontinence, or dysuria Musculoskeletal:No arthalgias, myalgias or joint swelling Skin:No rash or bothersome skin lesions except Left groin non-tender mass as per HPI Breast:No lumps or nipple discharge Hematolgic/Lymphatic:Recently noticed Left groin mass as per HPI    Past Medical History:Reviewed  Past Medical History  Surgical History:  PCI, cholecystectomy, tubal ligation Medical Problems:  HTN, HLD, CAD, GERD Allergies:   amoxicillin Medications:  aspirin 325 mg, Toprol XL 25 mg, simvastatin, nitroglycerin prn, pantoprazole, Zantac, buproprion   Social History:Reviewed  Social History  Preferred Language: English Race:  White Ethnicity: Not Hispanic / Latino Occupation: patient recently lost her job and expresses concern that her employer-sponsored health insurance ends at the end of this month Age: 12 year Marital Status:  S  Smoking Status: Heavy tobacco smoker reviewed on 07/09/2016 Started Date: 07/10/1971 Packs per day: 0.50  Functional Status ------------------------------------------------ Bathing: Normal Cooking: Normal Dressing:  Normal Driving: Normal Eating: Normal Managing Meds: Normal Oral Care: Normal Shopping: Normal Toileting: Normal Transferring: Normal Walking: Normal  Cognitive Status ------------------------------------------------ Attention: Normal Decision Making: Normal Language: Normal Memory: Normal Motor: Normal Perception: Normal Problem Solving: Normal Visual and Spatial: Normal   Family History:Reviewed  Family Health History Mother, Deceased; History Unknown Father, Deceased; History Unknown Sister, Deceased; Leukemia; (past year)  Vital Signs as of Q000111Q:  Systolic [id number]: Diastolic 74: Heart Rate 88: Temp 99.26F (Temporal) Height 76ft 1.5in: Weight 125Lbs 0 Ounces: BMI 23.24 kg/m2  Physical Exam: Skin:no rash or prominent lesions except mildly excoriated erythematous rash between the inguinal skin folds consistent with tinea cruris Head:Atraumatic; no masses; no abnormalities Eyes:conjunctiva clear, EOM intact, PERRL Heart:RRR, no murmur Lungs:CTA bilaterally, no wheezes, rhonchi, rales.  Breathing unlabored. Abdomen:Soft, NT/ND, no HSM, no masses. Extremities:No deformities, clubbing, cyanosis, or edema.  Lymphatics:2 cm non-tender Left groin firm, round, slightly mobile mass consistent with an enlarged lymph node  Assessment: 60 year old Female with Left inguinal lymphadenopathy first noticed Saturday, 12/2 (11 days prior to current presentation), complicated by pertinent comorbidities including HTN, HLD, CAD, and ongoing tobacco abuse.  Plan:      - all risks, benefits, and alternatives to open excisional biopsy of Left inguinal mass (likely lymph node) were discussed with the patient, who agrees to proceed, and informed consent was accordingly obtained at that time      - [facility] in preparation for uninsured status with potentially new diagnosis requiring further workup/management      - smoking cessation  strongly advised and strategies to be more likely successful were discussed      - plan for open excisional biopsy of Left inguinal subcutaneous mass (likely lymph node) 12/18      - surgical follow-up 2 weeks after planned procedure  -- Corene Cornea E. Rosana Hoes, MD, [place]: [facility] General Surgery and Vascular  Care Office #: [phone]

## 2016-07-13 ENCOUNTER — Ambulatory Visit (HOSPITAL_COMMUNITY): Payer: Commercial Managed Care - HMO | Admitting: Anesthesiology

## 2016-07-13 ENCOUNTER — Ambulatory Visit (HOSPITAL_COMMUNITY)
Admission: RE | Admit: 2016-07-13 | Discharge: 2016-07-13 | Disposition: A | Discharge status: Home / Self Care | Payer: Commercial Managed Care - HMO | Source: Ambulatory Visit | Attending: Surgery | Admitting: Surgery

## 2016-07-13 ENCOUNTER — Encounter (HOSPITAL_COMMUNITY): Payer: Self-pay | Admitting: Anesthesiology

## 2016-07-13 ENCOUNTER — Encounter (HOSPITAL_COMMUNITY)
Admission: RE | Disposition: A | Discharge status: Home / Self Care | Payer: Self-pay | Source: Ambulatory Visit | Attending: Surgery

## 2016-07-13 DIAGNOSIS — C8285 Other types of follicular lymphoma, lymph nodes of inguinal region and lower limb: Secondary | ICD-10-CM | POA: Insufficient documentation

## 2016-07-13 DIAGNOSIS — F419 Anxiety disorder, unspecified: Secondary | ICD-10-CM | POA: Insufficient documentation

## 2016-07-13 DIAGNOSIS — Z7982 Long term (current) use of aspirin: Secondary | ICD-10-CM | POA: Diagnosis not present

## 2016-07-13 DIAGNOSIS — R590 Localized enlarged lymph nodes: Secondary | ICD-10-CM | POA: Diagnosis present

## 2016-07-13 DIAGNOSIS — E785 Hyperlipidemia, unspecified: Secondary | ICD-10-CM | POA: Diagnosis not present

## 2016-07-13 DIAGNOSIS — C8335 Diffuse large B-cell lymphoma, lymph nodes of inguinal region and lower limb: Secondary | ICD-10-CM | POA: Insufficient documentation

## 2016-07-13 DIAGNOSIS — I10 Essential (primary) hypertension: Secondary | ICD-10-CM | POA: Diagnosis not present

## 2016-07-13 DIAGNOSIS — I252 Old myocardial infarction: Secondary | ICD-10-CM | POA: Diagnosis not present

## 2016-07-13 DIAGNOSIS — K219 Gastro-esophageal reflux disease without esophagitis: Secondary | ICD-10-CM | POA: Insufficient documentation

## 2016-07-13 DIAGNOSIS — Z9049 Acquired absence of other specified parts of digestive tract: Secondary | ICD-10-CM | POA: Insufficient documentation

## 2016-07-13 DIAGNOSIS — I2510 Atherosclerotic heart disease of native coronary artery without angina pectoris: Secondary | ICD-10-CM | POA: Insufficient documentation

## 2016-07-13 DIAGNOSIS — F329 Major depressive disorder, single episode, unspecified: Secondary | ICD-10-CM | POA: Diagnosis not present

## 2016-07-13 DIAGNOSIS — M1990 Unspecified osteoarthritis, unspecified site: Secondary | ICD-10-CM | POA: Insufficient documentation

## 2016-07-13 DIAGNOSIS — F17210 Nicotine dependence, cigarettes, uncomplicated: Secondary | ICD-10-CM | POA: Diagnosis not present

## 2016-07-13 HISTORY — PX: INGUINAL LYMPH NODE BIOPSY: SHX5865

## 2016-07-13 SURGERY — BIOPSY, LYMPH NODE, INGUINAL, OPEN
Anesthesia: General | Laterality: Left

## 2016-07-13 MED ORDER — CHLORHEXIDINE GLUCONATE CLOTH 2 % EX PADS: 6.0000 | MEDICATED_PAD | Freq: Once | CUTANEOUS | Status: DC

## 2016-07-13 MED ORDER — FENTANYL CITRATE (PF) 100 MCG/2ML IJ SOLN
INTRAMUSCULAR | Status: DC | PRN
Start: 2016-07-13 — End: 2016-07-13
  Administered 2016-07-13: 25 ug via INTRAVENOUS

## 2016-07-13 MED ORDER — MIDAZOLAM HCL 2 MG/2ML IJ SOLN
1.0000 mg | INTRAMUSCULAR | Status: DC | PRN
  Administered 2016-07-13: 2 mg via INTRAVENOUS

## 2016-07-13 MED ORDER — PHENYLEPHRINE 40 MCG/ML (10ML) SYRINGE FOR IV PUSH (FOR BLOOD PRESSURE SUPPORT)
PREFILLED_SYRINGE | INTRAVENOUS | Status: AC
  Filled 2016-07-13: qty 10

## 2016-07-13 MED ORDER — GLYCOPYRROLATE 0.2 MG/ML IJ SOLN
0.2000 mg | Freq: Once | INTRAMUSCULAR | Status: AC | PRN
  Administered 2016-07-13: 0.2 mg via INTRAVENOUS
  Filled 2016-07-13: qty 1

## 2016-07-13 MED ORDER — LIDOCAINE HCL (CARDIAC) 20 MG/ML IV SOLN
INTRAVENOUS | Status: DC | PRN
  Administered 2016-07-13: 40 mg via INTRAVENOUS
  Administered 2016-07-13: 100 mg via INTRAVENOUS

## 2016-07-13 MED ORDER — ONDANSETRON HCL 4 MG/2ML IJ SOLN
4.0000 mg | Freq: Once | INTRAMUSCULAR | Status: AC
  Administered 2016-07-13: 4 mg via INTRAVENOUS
  Filled 2016-07-13: qty 2

## 2016-07-13 MED ORDER — LIDOCAINE HCL (PF) 1 % IJ SOLN
INTRAMUSCULAR | Status: AC
  Filled 2016-07-13: qty 5

## 2016-07-13 MED ORDER — PHENYLEPHRINE HCL 10 MG/ML IJ SOLN
INTRAMUSCULAR | Status: DC | PRN
  Administered 2016-07-13: 80 ug via INTRAVENOUS

## 2016-07-13 MED ORDER — MIDAZOLAM HCL 2 MG/2ML IJ SOLN
INTRAMUSCULAR | Status: AC
  Filled 2016-07-13: qty 2

## 2016-07-13 MED ORDER — FENTANYL CITRATE (PF) 100 MCG/2ML IJ SOLN
INTRAMUSCULAR | Status: AC
  Filled 2016-07-13: qty 2

## 2016-07-13 MED ORDER — SUCCINYLCHOLINE CHLORIDE 20 MG/ML IJ SOLN
INTRAMUSCULAR | Status: AC
  Filled 2016-07-13: qty 1

## 2016-07-13 MED ORDER — BUPIVACAINE HCL (PF) 0.5 % IJ SOLN
INTRAMUSCULAR | Status: AC
  Filled 2016-07-13: qty 30

## 2016-07-13 MED ORDER — LIDOCAINE HCL 1 % IJ SOLN
INTRAMUSCULAR | Status: DC | PRN
  Administered 2016-07-13: 3 mL via INTRAMUSCULAR
  Administered 2016-07-13: 6 mL via INTRAMUSCULAR

## 2016-07-13 MED ORDER — VANCOMYCIN HCL IN DEXTROSE 1-5 GM/200ML-% IV SOLN
1000.0000 mg | INTRAVENOUS | Status: AC
  Administered 2016-07-13: 1000 mg via INTRAVENOUS
  Filled 2016-07-13: qty 200

## 2016-07-13 MED ORDER — LACTATED RINGERS IV SOLN
INTRAVENOUS | Status: DC
  Administered 2016-07-13 (×2): via INTRAVENOUS

## 2016-07-13 MED ORDER — PROPOFOL 10 MG/ML IV BOLUS
INTRAVENOUS | Status: AC
  Filled 2016-07-13: qty 40

## 2016-07-13 MED ORDER — DEXTROSE 5 % IV SOLN
INTRAVENOUS | Status: DC | PRN
  Administered 2016-07-13: 08:00:00 via INTRAVENOUS

## 2016-07-13 MED ORDER — LIDOCAINE HCL (PF) 1 % IJ SOLN
INTRAMUSCULAR | Status: AC
  Filled 2016-07-13: qty 30

## 2016-07-13 MED ORDER — FENTANYL CITRATE (PF) 100 MCG/2ML IJ SOLN
25.0000 ug | INTRAMUSCULAR | Status: AC | PRN
  Administered 2016-07-13 (×2): 25 ug via INTRAVENOUS
  Filled 2016-07-13: qty 2

## 2016-07-13 MED ORDER — 0.9 % SODIUM CHLORIDE (POUR BTL) OPTIME
TOPICAL | Status: DC | PRN
  Administered 2016-07-13: 1000 mL

## 2016-07-13 SURGICAL SUPPLY — 39 items
ADH SKN CLS APL DERMABOND .7 (GAUZE/BANDAGES/DRESSINGS) ×1
APPLIER CLIP 9.375 SM OPEN (CLIP)
APR CLP SM 9.3 20 MLT OPN (CLIP)
BAG HAMPER (MISCELLANEOUS) ×3 IMPLANT
CHLORAPREP W/TINT 26ML (MISCELLANEOUS) ×2 IMPLANT
CLIP APPLIE 9.375 SM OPEN (CLIP) IMPLANT
CLOTH BEACON ORANGE TIMEOUT ST (SAFETY) ×3 IMPLANT
COVER LIGHT HANDLE STERIS (MISCELLANEOUS) ×6 IMPLANT
DECANTER SPIKE VIAL GLASS SM (MISCELLANEOUS) ×5 IMPLANT
DERMABOND ADVANCED (GAUZE/BANDAGES/DRESSINGS) ×2
DERMABOND ADVANCED .7 DNX12 (GAUZE/BANDAGES/DRESSINGS) IMPLANT
ELECT REM PT RETURN 9FT ADLT (ELECTROSURGICAL) ×3
ELECTRODE REM PT RTRN 9FT ADLT (ELECTROSURGICAL) ×1 IMPLANT
GLOVE BIOGEL PI IND STRL 7.0 (GLOVE) ×1 IMPLANT
GLOVE BIOGEL PI IND STRL 7.5 (GLOVE) ×1 IMPLANT
GLOVE BIOGEL PI INDICATOR 7.0 (GLOVE) ×2
GLOVE BIOGEL PI INDICATOR 7.5 (GLOVE) ×2
GLOVE ECLIPSE 6.5 STRL STRAW (GLOVE) ×2 IMPLANT
GLOVE ECLIPSE 7.0 STRL STRAW (GLOVE) ×3 IMPLANT
GOWN STRL REUS W/TWL LRG LVL3 (GOWN DISPOSABLE) ×6 IMPLANT
KIT CLEAN CATCH URINE (SET/KITS/TRAYS/PACK) ×2 IMPLANT
KIT ROOM TURNOVER APOR (KITS) ×3 IMPLANT
MANIFOLD NEPTUNE II (INSTRUMENTS) ×3 IMPLANT
NDL HYPO 25X1 1.5 SAFETY (NEEDLE) ×1 IMPLANT
NEEDLE HYPO 25X1 1.5 SAFETY (NEEDLE) ×3 IMPLANT
NS IRRIG 1000ML POUR BTL (IV SOLUTION) ×3 IMPLANT
PACK MINOR (CUSTOM PROCEDURE TRAY) ×3 IMPLANT
PAD ARMBOARD 7.5X6 YLW CONV (MISCELLANEOUS) ×3 IMPLANT
PAD TELFA 3X4 1S STER (GAUZE/BANDAGES/DRESSINGS) IMPLANT
SET BASIN LINEN APH (SET/KITS/TRAYS/PACK) ×3 IMPLANT
SHEARS HARMONIC 9CM CVD (BLADE) IMPLANT
SPONGE INTESTINAL PEANUT (DISPOSABLE) ×1 IMPLANT
SUT MNCRL AB 4-0 PS2 18 (SUTURE) ×2 IMPLANT
SUT SILK 2 0 (SUTURE) ×3
SUT SILK 2-0 18XBRD TIE 12 (SUTURE) IMPLANT
SUT VIC AB 3-0 SH 27 (SUTURE) ×3
SUT VIC AB 3-0 SH 27X BRD (SUTURE) ×1 IMPLANT
SUT VICRYL AB 3 0 TIES (SUTURE) ×1 IMPLANT
SYR CONTROL 10ML LL (SYRINGE) ×3 IMPLANT

## 2016-07-13 NOTE — Op Note (Signed)
SURGICAL OPERATIVE REPORT  DATE OF PROCEDURE: 07/13/2016  ATTENDING Surgeon(s): Vickie Epley, MD  ANESTHESIA: Local  PRE-OPERATIVE DIAGNOSIS: Left inguinal lymphadenopathy (icd-10: R59.9)  POST-OPERATIVE DIAGNOSIS: Left inguinal lymphadenopathy (icd-10: R59.9)  PROCEDURE(S):  1.) Open excision of Left inguinal 2.5 cm lymph node (cpt: 38505)  INTRAOPERATIVE FINDINGS: 2.5 cm x 2.5 cm Left inguinal lymph node removed intact  INTRAVENOUS FLUIDS: 800 mL crystalloid   ESTIMATED BLOOD LOSS: Minimal (< 20 mL)  URINE OUTPUT: No Foley  SPECIMENS: 2.5 cm x 2.5 cm Left inguinal lymph node removed intact  IMPLANTS: None  DRAINS: None  COMPLICATIONS: None apparent  CONDITION AT END OF PROCEDURE: Hemodynamically stable and extubated  DISPOSITION OF PATIENT: PACU  INDICATIONS FOR PROCEDURE:  Patient is a 59 y.o. female who presented forevaluation of a large non-painful firm Left groin mass that patient noticed while taking a bath 11 days prior to referral. She denied any recent infections, trauma/injuries, or similar prior masses and otherwise denied any fever/chills, CP, or SOB.All risks, benefits, and alternatives to above procedure were discussed with the patient, all of patient's questions were answered to his expressed satisfaction, and informed consent was obtained and documented.  DETAILS OF PROCEDURE: Patient was brought to the operating suite and appropriately identified. General anesthesia was administered and LMA placed by anesthetist. In supine position, operative site was prepped and draped in the usual sterile fashion, and following a brief time out, a 2.5 cm linear transverse incision was made parallel to the inguinal crease using a #15 blade scalpel, and incision was extended deep around the enlarged lymph node using blunt dissection and electrocautery. During the course of dissecting free the lymph node, it was not disrupted and was removed intact. Lymph node  pedicle was then isolated and ligated using 2-0 silk tie. Hemostasis was confirmed, and the wound was copiously irrigated with sterile warm saline. Dermis was re-approximated using buried interrupted 3-0 Vicryl suture, and running subcuticular 4-0 Monocryl suture was used to re-approximate epidermis. Skin was then cleaned and dried, and sterile Dermabond skin glue was applied to the wound. Patient was then safely transferred to recovery for post-procedural monitoring and care.  I was present for all aspects of the above procedure, and there were no complications apparent.

## 2016-07-13 NOTE — Interval H&P Note (Signed)
History and Physical Interval Note:  07/13/2016 7:23 AM  Gabriella Love  has presented today for surgery, with the diagnosis of lymphadenopathy  The various methods of treatment have been discussed with the patient and family. After consideration of risks, benefits and other options for treatment, the patient has consented to  Procedure(s): EXCISIONAL BIOPSY OF LEFT INGUINAL LYMPH NODE (Left) as a surgical intervention .  The patient's history has been reviewed, patient examined, no change in status, stable for surgery.  I have reviewed the patient's chart and labs.  Questions were answered to the patient's satisfaction.     Vickie Epley

## 2016-07-13 NOTE — Anesthesia Procedure Notes (Signed)
Procedure Name: LMA Insertion Date/Time: 07/13/2016 7:42 AM Performed by: Andree Elk, AMY A Pre-anesthesia Checklist: Patient identified, Timeout performed, Emergency Drugs available, Suction available and Patient being monitored Patient Re-evaluated:Patient Re-evaluated prior to inductionOxygen Delivery Method: Circle system utilized Preoxygenation: Pre-oxygenation with 100% oxygen Intubation Type: IV induction Ventilation: Mask ventilation without difficulty LMA: LMA inserted LMA Size: 4.0 Number of attempts: 1 Placement Confirmation: positive ETCO2 and breath sounds checked- equal and bilateral Tube secured with: Tape Dental Injury: Teeth and Oropharynx as per pre-operative assessment

## 2016-07-13 NOTE — Anesthesia Postprocedure Evaluation (Signed)
Anesthesia Post Note  Patient: Gabriella Love  Procedure(s) Performed: Procedure(s) (LRB): EXCISIONAL BIOPSY OF LEFT INGUINAL LYMPH NODE (Left)  Patient location during evaluation: PACU Anesthesia Type: General Level of consciousness: awake and alert and oriented Pain management: pain level controlled Vital Signs Assessment: post-procedure vital signs reviewed and stable Respiratory status: spontaneous breathing and respiratory function stable Cardiovascular status: blood pressure returned to baseline and stable Postop Assessment: no signs of nausea or vomiting Anesthetic complications: no    Last Vitals:  Vitals:   07/13/16 0730 07/13/16 0832  BP: 133/72   Pulse:    Resp: 17   Temp:  36.5 C    Last Pain:  Vitals:   07/13/16 0659  TempSrc: Oral                 Gabriella Love

## 2016-07-13 NOTE — Transfer of Care (Signed)
Immediate Anesthesia Transfer of Care Note  Patient: Gabriella Love  Procedure(s) Performed: Procedure(s): EXCISIONAL BIOPSY OF LEFT INGUINAL LYMPH NODE (Left)  Patient Location: PACU  Anesthesia Type:General  Level of Consciousness: awake, oriented and patient cooperative  Airway & Oxygen Therapy: Patient Spontanous Breathing and Patient connected to face mask oxygen  Post-op Assessment: Report given to RN and Post -op Vital signs reviewed and stable  Post vital signs: Reviewed and stable  Last Vitals:  Vitals:   07/13/16 0715 07/13/16 0730  BP: 139/79 133/72  Pulse:    Resp: (!) 21 17  Temp:      Last Pain:  Vitals:   07/13/16 0659  TempSrc: Oral      Patients Stated Pain Goal: 7 ([id number])  Complications: No apparent anesthesia complications

## 2016-07-13 NOTE — Discharge Instructions (Signed)
In addition to included general post-operative instructions for Open Excisional Biopsy of Left Inguinal Lymph Node,  Diet: Resume home heart healthy diet.   Activity: No heavy lifting >20 pounds (children, pets, laundry, garbage) or strenuous activity until follow-up, but light activity and walking are encouraged. Do not drive or drink alcohol if taking narcotic pain medications.  Wound care: 2 days after surgery (Wednesday, 12/20), may shower/get incision wet with soapy water and pat dry (do not rub incisions), but no baths or submerging incision underwater until follow-up.   Medications: Resume all home medications. For mild to moderate pain: acetaminophen (Tylenol) or ibuprofen (if no kidney disease). Narcotic pain medications, if prescribed, can be used for severe pain, though may cause nausea, constipation, and drowsiness. Do not combine Tylenol and Percocet within a 6 hour period as Percocet contains Tylenol. If you do not need the narcotic pain medication, you do not need to fill the prescription.  Call office [phone]) at any time if any questions, worsening pain, fevers/chills, bleeding, drainage from incision site, or other concerns.  ** Call [facility] financial counselor Caryl Pina) about identifying resources to assist with any further medical care needed while you are otherwise seeking a new employment. If you have not already been provided the phone number, please call our office to ask.   Open Lymph Node Biopsy, Care After Introduction Refer to this sheet in the next few weeks. These instructions provide you with information about caring for yourself after your procedure. Your health care provider may also give you more specific instructions. Your treatment has been planned according to current medical practices, but problems sometimes occur. Call your health care provider if you have any problems or questions after your procedure. What can I expect after the  procedure? After the procedure, it is common to have:  Bruising.  Soreness.  Mild swelling. Follow these instructions at home: Medicines  Take over-the-counter and prescription medicines only as told by your health care provider.  If you were prescribed an antibiotic medicine, take it as told by your health care provider. Do not stop taking the antibiotic even if you start to feel better. Incision care  Follow instructions from your health care provider about how to take care of your incision. Make sure you:  Wash your hands with soap and water before you change your bandage (dressing). If soap and water are not available, use hand sanitizer.  Change your dressing as told by your health care provider.  Leave stitches (sutures), skin glue, or adhesive strips in place. These skin closures may need to stay in place for 2 weeks or longer. If adhesive strip edges start to loosen and curl up, you may trim the loose edges. Do not remove adhesive strips completely unless your health care provider tells you to do that.  Check your incision area every day for signs of infection. Check for:  More redness, swelling, or pain.  More fluid or blood.  Warmth.  Pus or a bad smell. Driving  Do not drive for 24 hours if you received a sedative.  Do not drive or operate heavy machinery while taking prescription pain medicine. General instructions  It is your responsibility to get the results of your procedure. Ask your health care provider or the department performing the procedure when your results will be ready.  Return to your normal activities as told by your health care provider. Ask your health care provider what activities are safe for you.  Do not take baths,  swim, or use a hot tub until your health care provider approves.  Wear compression stockings as told by your health care provider. These stockings help to prevent blood clots and reduce swelling in your legs.  Keep all  follow-up visits as told by your health care provider. This is important. Contact a health care provider if:  You have more redness, swelling, or pain around your incision.  You have more fluid or blood coming from your incision.  Your incision feels warm to the touch.  You have pus or a bad smell coming from your incision.  You have a fever.  You have pain or numbness that gets worse or lasts longer than a few days. This information is not intended to replace advice given to you by your health care provider. Make sure you discuss any questions you have with your health care provider. Document Released: 08/09/2015 Document Revised: 12/19/2015 Document Reviewed: 11/07/2014  2017 Elsevier

## 2016-07-13 NOTE — Anesthesia Preprocedure Evaluation (Signed)
Anesthesia Evaluation  Patient identified by MRN, date of birth, ID band Patient awake    Reviewed: Allergy & Precautions, NPO status , Patient's Chart, lab work & pertinent test results, reviewed documented beta blocker date and time   Airway Mallampati: II  TM Distance: >3 FB     Dental  (+) Teeth Intact   Pulmonary Current Smoker (am cough),    breath sounds clear to auscultation       Cardiovascular hypertension, Pt. on medications and Pt. on home beta blockers (-) angina+ CAD, + Past MI and + Cardiac Stents   Rhythm:Regular Rate:Normal     Neuro/Psych PSYCHIATRIC DISORDERS Anxiety Depression    GI/Hepatic GERD  Controlled and Medicated,  Endo/Other    Renal/GU      Musculoskeletal  (+) Arthritis ,   Abdominal   Peds  Hematology   Anesthesia Other Findings   Reproductive/Obstetrics                             Anesthesia Physical Anesthesia Plan  ASA: III  Anesthesia Plan: General   Post-op Pain Management:    Induction: Intravenous  Airway Management Planned: LMA  Additional Equipment:   Intra-op Plan:   Post-operative Plan: Extubation in OR  Informed Consent: I have reviewed the patients History and Physical, chart, labs and discussed the procedure including the risks, benefits and alternatives for the proposed anesthesia with the patient or authorized representative who has indicated his/her understanding and acceptance.     Plan Discussed with:   Anesthesia Plan Comments:         Anesthesia Quick Evaluation

## 2016-07-13 NOTE — Addendum Note (Signed)
Addendum  created 07/13/16 [id number] by Mickel Baas, CRNA   Anesthesia Intra Meds edited

## 2016-07-14 ENCOUNTER — Encounter (HOSPITAL_COMMUNITY): Payer: Self-pay | Admitting: Surgery

## 2016-07-16 ENCOUNTER — Telehealth (HOSPITAL_COMMUNITY): Payer: Self-pay | Admitting: Emergency Medicine

## 2016-07-16 ENCOUNTER — Encounter (HOSPITAL_COMMUNITY): Payer: Self-pay

## 2016-07-16 ENCOUNTER — Other Ambulatory Visit (HOSPITAL_COMMUNITY): Payer: Self-pay | Admitting: Oncology

## 2016-07-16 ENCOUNTER — Encounter (HOSPITAL_COMMUNITY)
Admission: RE | Admit: 2016-07-16 | Discharge: 2016-07-16 | Disposition: A | Discharge status: Home / Self Care | Payer: Commercial Managed Care - HMO | Source: Ambulatory Visit | Attending: Surgery | Admitting: Surgery

## 2016-07-16 ENCOUNTER — Encounter (HOSPITAL_COMMUNITY): Payer: Self-pay | Admitting: Oncology

## 2016-07-16 DIAGNOSIS — C8330 Diffuse large B-cell lymphoma, unspecified site: Secondary | ICD-10-CM

## 2016-07-16 DIAGNOSIS — C8335 Diffuse large B-cell lymphoma, lymph nodes of inguinal region and lower limb: Secondary | ICD-10-CM

## 2016-07-16 HISTORY — DX: Diffuse large B-cell lymphoma, unspecified site: C83.30

## 2016-07-16 NOTE — Telephone Encounter (Signed)
Called pt to introduce myself.  To let her know that we need to get A BMBX explained the procedure and that it was at [facility].  She is having a port placed tomorrow.  Echo scheduled for the 28th.  My number given if she needs to call me back.

## 2016-07-16 NOTE — H&P (Signed)
[facility] Surgical History and Physical - 07/16/2016  Patient Name: Gabriella Love Date of Birth: 07/15/1956  Subjective: 60 year old female presents earlier than initially scheduled for surgery follow-up, to discuss pathology results, and to schedule port placement. Patient first noticed a large, firm, non-painful Left inguinal mass 06/27/2016 while taking a bath. She was evaluated by her ob/gyn, Dr. Glo Herring, on 07/08/2016 and was referred to and seen by myself the same day. She underwent open excisional biopsy 07/13/2016, and pathology results became available today. At today's visit, patient hashas no  significant complaints, denies pain, fever/chills, CP, or SOB, but she expresses significant anxiety about her pathology results and her insurance.  Review of Symptoms:  Constitutional:No fevers, chills, or unexplained weight loss Head:Atraumatic; no masses; no abnormalities Eyes:No visual changes or eye pain Nose/Mouth/Throat:No nasal congestion, rhinorrhea, oral lesions, postnasal drip or sore throat  Cardiovascular: No chest pain or palpitations.  Respiratory:No cough, shortness of breath or wheezing  GastrointestinNo diarrhea, constipation, blood in stools, abdominal pain, vomiting or heartburn Genitourinary:No urinary frequency, hematuria, incontinence, or dysuria Musculoskeletal:No arthalgias, myalgias or joint swelling Skin:No rash or bothersome skin lesions except recent Left inguinal biopsy site Breast:No lumps or nipple discharge Hematolgic/Lymphatic:Recently noticed Left groin mass as per HPI    Past Medical History: Reviewed  Past Medical History  Surgical History:  PCI, cholecystectomy, tubal ligation Medical Problems:  HTN, HLD, CAD, GERD Allergies:  amoxicillin Medications:  aspirin 325 mg, Toprol XL 25 mg, simvastatin, nitroglycerin prn, pantoprazole, Zantac, buproprion   Social History:Reviewed  Social History  Preferred Language: English Race:   White Ethnicity: Not Hispanic / Latino Occupation: patient recently lost her job and expresses concern that her employer-sponsored health insurance ends at the end of this month Age: 47 year Marital Status:  S  Smoking Status: Heavy tobacco smoker reviewed on 07/16/2016 Started Date: 07/10/1971 Packs per day: 0.50  Functional Status ------------------------------------------------ Bathing: Normal Cooking: Normal Dressing: Normal Driving: Normal Eating: Normal Managing Meds: Normal Oral Care: Normal Shopping: Normal Toileting: Normal Transferring: Normal Walking: Normal  Cognitive Status ------------------------------------------------ Attention: Normal Decision Making: Normal Language: Normal Memory: Normal Motor: Normal Perception: Normal Problem Solving: Normal Visual and Spatial: Normal   Family History:Reviewed  Family Health History Mother, Deceased; History Unknown Father, Deceased; History Unknown Sister, Deceased; Leukemia; (past year)  Vital Signs as of Q000111Q:  Systolic [id number]: Diastolic 75: Heart Rate 95: Temp 37.28C (Temporal) Height 157CM: Weight 56.25KG: BMI 22.68  kg/m2  Physical Exam: Skin: no rash or prominent lesions except mildly excoriated erythematous rash between the inguinal skin folds consistent with tinea cruris Head:Atraumatic; no masses; no abnormalities Eyes:conjunctiva clear, EOM intact, PERRL Heart:RRR, no murmur Lungs:CTA bilaterally, no wheezes, rhonchi, rales.  Breathing unlabored. Abdomen:Soft, NT/ND, no HSM, no masses. 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  Plan:        - results of pathology discussed with patient and her husband       - all risks, benefits and alternatives to insertion of tunneled central venous catheter with subcutaneous port discussed with patient, who elects to proceed, and informed consent accordingly obtained       - will plan for insertion of tunneled central venous  catheter with subcutaneous port tomorrow, 12/22       - referral made to and discussed with medical oncology and with patient  -- Marilynne Drivers. Rosana Hoes, MD, [place]: [facility] General Surgery and Vascular Care Office #: [phone]

## 2016-07-17 ENCOUNTER — Encounter (HOSPITAL_COMMUNITY)
Admission: RE | Disposition: A | Discharge status: Home / Self Care | Payer: Self-pay | Source: Ambulatory Visit | Attending: Surgery

## 2016-07-17 ENCOUNTER — Ambulatory Visit (HOSPITAL_COMMUNITY): Payer: Commercial Managed Care - HMO | Admitting: Anesthesiology

## 2016-07-17 ENCOUNTER — Ambulatory Visit (HOSPITAL_COMMUNITY): Payer: Commercial Managed Care - HMO

## 2016-07-17 ENCOUNTER — Ambulatory Visit (HOSPITAL_COMMUNITY)
Admission: RE | Admit: 2016-07-17 | Discharge: 2016-07-17 | Disposition: A | Discharge status: Home / Self Care | Payer: Commercial Managed Care - HMO | Source: Ambulatory Visit | Attending: Surgery | Admitting: Surgery

## 2016-07-17 ENCOUNTER — Encounter (HOSPITAL_COMMUNITY): Payer: Self-pay | Admitting: *Deleted

## 2016-07-17 DIAGNOSIS — C8330 Diffuse large B-cell lymphoma, unspecified site: Secondary | ICD-10-CM | POA: Diagnosis not present

## 2016-07-17 DIAGNOSIS — I252 Old myocardial infarction: Secondary | ICD-10-CM | POA: Insufficient documentation

## 2016-07-17 DIAGNOSIS — M1990 Unspecified osteoarthritis, unspecified site: Secondary | ICD-10-CM | POA: Diagnosis not present

## 2016-07-17 DIAGNOSIS — R590 Localized enlarged lymph nodes: Secondary | ICD-10-CM | POA: Diagnosis not present

## 2016-07-17 DIAGNOSIS — Z880 Allergy status to penicillin: Secondary | ICD-10-CM | POA: Insufficient documentation

## 2016-07-17 DIAGNOSIS — F329 Major depressive disorder, single episode, unspecified: Secondary | ICD-10-CM | POA: Insufficient documentation

## 2016-07-17 DIAGNOSIS — F419 Anxiety disorder, unspecified: Secondary | ICD-10-CM | POA: Insufficient documentation

## 2016-07-17 DIAGNOSIS — I10 Essential (primary) hypertension: Secondary | ICD-10-CM | POA: Diagnosis not present

## 2016-07-17 DIAGNOSIS — Z419 Encounter for procedure for purposes other than remedying health state, unspecified: Secondary | ICD-10-CM

## 2016-07-17 DIAGNOSIS — K219 Gastro-esophageal reflux disease without esophagitis: Secondary | ICD-10-CM | POA: Insufficient documentation

## 2016-07-17 DIAGNOSIS — F17210 Nicotine dependence, cigarettes, uncomplicated: Secondary | ICD-10-CM | POA: Insufficient documentation

## 2016-07-17 DIAGNOSIS — E785 Hyperlipidemia, unspecified: Secondary | ICD-10-CM | POA: Diagnosis not present

## 2016-07-17 DIAGNOSIS — Z95828 Presence of other vascular implants and grafts: Secondary | ICD-10-CM

## 2016-07-17 DIAGNOSIS — Z7982 Long term (current) use of aspirin: Secondary | ICD-10-CM | POA: Diagnosis not present

## 2016-07-17 DIAGNOSIS — I2510 Atherosclerotic heart disease of native coronary artery without angina pectoris: Secondary | ICD-10-CM | POA: Insufficient documentation

## 2016-07-17 DIAGNOSIS — Z9889 Other specified postprocedural states: Secondary | ICD-10-CM

## 2016-07-17 HISTORY — PX: PORTACATH PLACEMENT: SHX2246

## 2016-07-17 SURGERY — INSERTION, TUNNELED CENTRAL VENOUS DEVICE, WITH PORT
Anesthesia: Monitor Anesthesia Care | Site: Chest

## 2016-07-17 MED ORDER — LIDOCAINE HCL (CARDIAC) 10 MG/ML IV SOLN
INTRAVENOUS | Status: DC | PRN
  Administered 2016-07-17: 50 mg via INTRAVENOUS

## 2016-07-17 MED ORDER — LIDOCAINE HCL (PF) 1 % IJ SOLN
INTRAMUSCULAR | Status: AC
  Filled 2016-07-17: qty 5

## 2016-07-17 MED ORDER — SODIUM CHLORIDE 0.9 % IV SOLN
INTRAVENOUS | Status: DC | PRN
  Administered 2016-07-17: 500 mL via INTRAMUSCULAR

## 2016-07-17 MED ORDER — VANCOMYCIN HCL IN DEXTROSE 1-5 GM/200ML-% IV SOLN
1000.0000 mg | INTRAVENOUS | Status: AC
  Administered 2016-07-17: 1000 mg via INTRAVENOUS
  Filled 2016-07-17: qty 200

## 2016-07-17 MED ORDER — FENTANYL CITRATE (PF) 100 MCG/2ML IJ SOLN
INTRAMUSCULAR | Status: AC
  Filled 2016-07-17: qty 2

## 2016-07-17 MED ORDER — MIDAZOLAM HCL 2 MG/2ML IJ SOLN
INTRAMUSCULAR | Status: AC
  Filled 2016-07-17: qty 2

## 2016-07-17 MED ORDER — CHLORHEXIDINE GLUCONATE CLOTH 2 % EX PADS: 6.0000 | MEDICATED_PAD | Freq: Once | CUTANEOUS | Status: DC

## 2016-07-17 MED ORDER — VANCOMYCIN HCL 10 G IV SOLR
INTRAVENOUS | Status: AC
  Filled 2016-07-17: qty 1500

## 2016-07-17 MED ORDER — FENTANYL CITRATE (PF) 100 MCG/2ML IJ SOLN
INTRAMUSCULAR | Status: DC | PRN
  Administered 2016-07-17 (×4): 25 ug via INTRAVENOUS

## 2016-07-17 MED ORDER — ONDANSETRON HCL 4 MG/2ML IJ SOLN
INTRAMUSCULAR | Status: DC | PRN
  Administered 2016-07-17: 4 mg via INTRAVENOUS

## 2016-07-17 MED ORDER — LACTATED RINGERS IV SOLN
INTRAVENOUS | Status: DC
  Administered 2016-07-17: 1000 mL via INTRAVENOUS

## 2016-07-17 MED ORDER — PROPOFOL 10 MG/ML IV BOLUS
INTRAVENOUS | Status: AC
  Filled 2016-07-17: qty 20

## 2016-07-17 MED ORDER — HEPARIN SOD (PORK) LOCK FLUSH 100 UNIT/ML IV SOLN
INTRAVENOUS | Status: DC | PRN
  Administered 2016-07-17: 400 [IU]

## 2016-07-17 MED ORDER — BUPIVACAINE HCL 0.5 % IJ SOLN
INTRAMUSCULAR | Status: DC | PRN
  Administered 2016-07-17: 7 mL
  Administered 2016-07-17: 13 mL

## 2016-07-17 MED ORDER — MIDAZOLAM HCL 5 MG/5ML IJ SOLN
INTRAMUSCULAR | Status: DC | PRN
  Administered 2016-07-17 (×2): 1 mg via INTRAVENOUS

## 2016-07-17 MED ORDER — LIDOCAINE HCL (PF) 1 % IJ SOLN
INTRAMUSCULAR | Status: AC
  Filled 2016-07-17: qty 30

## 2016-07-17 MED ORDER — BUPIVACAINE HCL (PF) 0.5 % IJ SOLN
INTRAMUSCULAR | Status: AC
  Filled 2016-07-17: qty 30

## 2016-07-17 MED ORDER — PROPOFOL 500 MG/50ML IV EMUL
INTRAVENOUS | Status: DC | PRN
  Administered 2016-07-17: 100 ug/kg/min via INTRAVENOUS

## 2016-07-17 MED ORDER — HEPARIN SOD (PORK) LOCK FLUSH 100 UNIT/ML IV SOLN
INTRAVENOUS | Status: AC
  Filled 2016-07-17: qty 5

## 2016-07-17 SURGICAL SUPPLY — 39 items
ADH SKN CLS APL DERMABOND .7 (GAUZE/BANDAGES/DRESSINGS) ×2
APPLIER CLIP 9.375 SM OPEN (CLIP)
APR CLP SM 9.3 20 MLT OPN (CLIP)
BAG DECANTER FOR FLEXI CONT (MISCELLANEOUS) ×3 IMPLANT
BAG HAMPER (MISCELLANEOUS) ×3 IMPLANT
BLADE SURG SZ11 CARB STEEL (BLADE) ×3 IMPLANT
CHLORAPREP W/TINT 10.5 ML (MISCELLANEOUS) ×6 IMPLANT
CLIP APPLIE 9.375 SM OPEN (CLIP) IMPLANT
CLOTH BEACON ORANGE TIMEOUT ST (SAFETY) ×3 IMPLANT
COVER LIGHT HANDLE STERIS (MISCELLANEOUS) ×6 IMPLANT
DECANTER SPIKE VIAL GLASS SM (MISCELLANEOUS) ×6 IMPLANT
DERMABOND ADVANCED (GAUZE/BANDAGES/DRESSINGS) ×4
DERMABOND ADVANCED .7 DNX12 (GAUZE/BANDAGES/DRESSINGS) ×1 IMPLANT
DRAPE C-ARM FOLDED MOBILE STRL (DRAPES) ×3 IMPLANT
ELECT REM PT RETURN 9FT ADLT (ELECTROSURGICAL) ×3
ELECTRODE REM PT RTRN 9FT ADLT (ELECTROSURGICAL) ×1 IMPLANT
GLOVE BIOGEL PI IND STRL 7.0 (GLOVE) ×1 IMPLANT
GLOVE BIOGEL PI IND STRL 7.5 (GLOVE) ×1 IMPLANT
GLOVE BIOGEL PI INDICATOR 7.0 (GLOVE) ×2
GLOVE BIOGEL PI INDICATOR 7.5 (GLOVE) ×2
GLOVE ECLIPSE 6.5 STRL STRAW (GLOVE) ×2 IMPLANT
GLOVE ECLIPSE 7.0 STRL STRAW (GLOVE) ×3 IMPLANT
GLOVE EXAM NITRILE MD LF STRL (GLOVE) ×2 IMPLANT
GOWN STRL REUS W/TWL LRG LVL3 (GOWN DISPOSABLE) ×6 IMPLANT
IV NS 500ML (IV SOLUTION) ×3
IV NS 500ML BAXH (IV SOLUTION) ×1 IMPLANT
KIT PORT POWER 8FR ISP MRI (Port) ×3 IMPLANT
KIT ROOM TURNOVER APOR (KITS) ×3 IMPLANT
MANIFOLD NEPTUNE II (INSTRUMENTS) ×3 IMPLANT
NDL HYPO 25X1 1.5 SAFETY (NEEDLE) ×1 IMPLANT
NEEDLE HYPO 25X1 1.5 SAFETY (NEEDLE) ×3 IMPLANT
PACK MINOR (CUSTOM PROCEDURE TRAY) ×3 IMPLANT
PAD ARMBOARD 7.5X6 YLW CONV (MISCELLANEOUS) ×3 IMPLANT
SET BASIN LINEN APH (SET/KITS/TRAYS/PACK) ×3 IMPLANT
SUT VIC AB 3-0 SH 27 (SUTURE) ×3
SUT VIC AB 3-0 SH 27X BRD (SUTURE) ×1 IMPLANT
SUT VIC AB 4-0 PS2 27 (SUTURE) ×3 IMPLANT
SYR 20CC LL (SYRINGE) ×3 IMPLANT
SYR CONTROL 10ML LL (SYRINGE) ×3 IMPLANT

## 2016-07-17 NOTE — Discharge Instructions (Signed)
In addition to included general post-operative instructions for Placement of central venous catheter with subcutaneous Port for chemotherapy,  Diet: Resume home heart healthy diet.   Activity: No heavy lifting >20 pounds (children, pets, laundry, garbage) or strenuous activity until follow-up, but light activity and walking are encouraged. Do not drive or drink alcohol if taking narcotic pain medications.  Wound care: 2 days after surgery (Sunday, 12/24), may shower/get incision wet with soapy water and pat dry (do not rub incisions), but no baths or submerging incision underwater until follow-up.   Medications: Resume all home medications. For mild to moderate pain: acetaminophen (Tylenol) or ibuprofen (if no kidney disease). Narcotic pain medications, if prescribed, can be used for severe pain, though may cause nausea, constipation, and drowsiness. Do not combine Tylenol and Percocet within a 6 hour period as Percocet contains Tylenol. If you do not need the narcotic pain medication, you do not need to fill the prescription.  Call office [phone]) at any time if any questions, worsening pain, fevers/chills, bleeding, drainage from incision site, or other concerns.

## 2016-07-17 NOTE — Anesthesia Procedure Notes (Signed)
Procedure Name: MAC Date/Time: 07/17/2016 7:45 AM Performed by: Vista Deck Pre-anesthesia Checklist: Patient identified, Emergency Drugs available, Suction available, Timeout performed and Patient being monitored Patient Re-evaluated:Patient Re-evaluated prior to inductionOxygen Delivery Method: Non-rebreather mask

## 2016-07-17 NOTE — Interval H&P Note (Signed)
History and Physical Interval Note:  07/17/2016 7:37 AM  Gabriella Love  has presented today for surgery, with the diagnosis of b cell lymphoma  The various methods of treatment have been discussed with the patient and family. After consideration of risks, benefits and other options for treatment, the patient has consented to  Procedure(s): INSERTION PORT-A-CATH (N/A) as a surgical intervention .  The patient's history has been reviewed, patient examined, no change in status, stable for surgery.  I have reviewed the patient's chart and labs.  Questions were answered to the patient's satisfaction.     Vickie Epley

## 2016-07-17 NOTE — Op Note (Signed)
SURGICAL PROCEDURE REPORT  DATE OF PROCEDURE: 07/17/2016   ATTENDING SURGEON: Corene Cornea E. 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 requiring durable central venous access for chemotherapy (ICD-10's: C83.35)  PROCEDURE(S): (cpt: 36561) 1.) Percutaneous access of Right internal jugular vein under ultrasound guidance  2.) Insertion of tunneled Right internal jugular Bard PowerPort central venous catheter with subcutaneous port  INTRAOPERATIVE FINDINGS: Patent easily compressible Right internal jugular vein with appropriate respiratory variations and well-secured tunneled central venous catheter with subcutaneous port at completion of the procedure  INTRAOPERATIVE FLUIDS: 300 mL crystalloid, 0 mL contrast used   FLUOROSCOPY: 0.6 seconds   ESTIMATED BLOOD LOSS: Minimal (<20 mL)   SPECIMENS: None   IMPLANTS: 80F tunneled Bard PowerPort central venous catheter with subcutaneous port  DRAINS: None   COMPLICATIONS: None apparent   CONDITION AT COMPLETION: Hemodynamically stable, awake   DISPOSITION: PACU   INDICATION(S) FOR PROCEDURE:  Patient is a 60 y.o. 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 All risks, benefits, and alternatives to above elective procedures were discussed with the patient, who elected to proceed, and informed consent was accordingly obtained at that time.  DETAILS OF PROCEDURE:  Patient was brought to the operative suite and appropriately identified. In Trendelenburg position, Right IJ venous access site was prepped and draped in the usual sterile fashion, and following a brief timeout,  limited duplex evaluation of the Right internal jugular vein was performed. Percutaneous Right IJ venous access was obtained under ultrasound guidance using Seldinger technique, by which local anesthetic was injected over the Right IJ vein, and access needle was inserted under direct ultrasound visualization into the Right IJ vein, through which soft guidewire was advanced, over which access needle was withdrawn. Guidewire was secured, attention was directed to injection of local anesthetic along the planned tunnel site, 2-3 cm transverse Right chest incision was made and confirmed to accommodate the subcutaneous port, and flushed catheter was tunneled retrograde from the port site over the Right chest to the Right IJ access site with the attached port well-secured to the catheter and within the subcutaneous pocket. Insertion sheath was advanced over the guidewire, which was withdrawn along with the insertion sheath dilator. Length of catheter needed to position the catheter tip at the atrio-caval junction was then measured under direct fluoroscopic visualization, after which the catheter was cut to the measured length and advanced through the sheath into the Right internal jugular vein and SVC without evidence of cardiac arrhythmias during the procedure. Port was confirmed to withdraw blood and flush easily, after which concentrated heparin was instilled into the port and catheter. Dermis at the subcutaneous pocket was re-approximated using buried interrupted 3-0 Vicryl suture, and 4-0 Vicryl suture was used to re-approximate skin at the insertion and subcutaneous port sites in running subcuticular fashion for the subcutaneous port and buried interrupted fashion for the insertion site. Skin was cleaned, dried, and sterile skin glue was applied. Patient was then safely transferred to PACU for a chest x-ray.  I was present for all aspects of the procedures, and there were no intraprocedural complications  apparent.

## 2016-07-17 NOTE — Anesthesia Procedure Notes (Signed)
Procedure Name: MAC Date/Time: 07/17/2016 7:25 AM Performed by: Vista Deck Pre-anesthesia Checklist: Patient identified, Emergency Drugs available, Suction available, Timeout performed and Patient being monitored Patient Re-evaluated:Patient Re-evaluated prior to inductionOxygen Delivery Method: Nasal Cannula

## 2016-07-17 NOTE — Anesthesia Postprocedure Evaluation (Signed)
Anesthesia Post Note  Patient: Gabriella Love  Procedure(s) Performed: Procedure(s) (LRB): INSERTION OF TUNNELED CENTRAL VENOUS CATHETER WITH SUBCUTANEOUS PORT (N/A)  Patient location during evaluation: PACU Anesthesia Type: MAC Level of consciousness: awake and alert Pain management: pain level controlled Vital Signs Assessment: post-procedure vital signs reviewed and stable Respiratory status: spontaneous breathing Cardiovascular status: stable Anesthetic complications: no     Last Vitals:  Vitals:   07/17/16 0845 07/17/16 0900  BP: 130/71 (!) 144/71  Pulse: 83 75  Resp: 10 15  Temp:      Last Pain:  Vitals:   07/17/16 0900  TempSrc:   PainSc: 0-No pain                 Jillane Po

## 2016-07-17 NOTE — Transfer of Care (Signed)
Immediate Anesthesia Transfer of Care Note  Patient: Gabriella Love  Procedure(s) Performed: Procedure(s): INSERTION OF TUNNELED CENTRAL VENOUS CATHETER WITH SUBCUTANEOUS PORT (N/A)  Patient Location: PACU  Anesthesia Type:MAC  Level of Consciousness: awake and patient cooperative  Airway & Oxygen Therapy: Patient Spontanous Breathing and Patient connected to nasal cannula oxygen  Post-op Assessment: Report given to RN and Post -op Vital signs reviewed and stable  Post vital signs: Reviewed and stable  Last Vitals:  Vitals:   07/17/16 0730 07/17/16 0735  BP: (!) 134/53 (!) 144/72  Pulse:    Resp: 12 13  Temp:      Last Pain:  Vitals:   07/17/16 0706  TempSrc: Oral  PainSc: 0-No pain      Patients Stated Pain Goal: 7 ([id number])  Complications: No apparent anesthesia complications

## 2016-07-17 NOTE — Anesthesia Preprocedure Evaluation (Signed)
Anesthesia Evaluation  Patient identified by MRN, date of birth, ID band Patient awake    Reviewed: Allergy & Precautions, NPO status , Patient's Chart, lab work & pertinent test results, reviewed documented beta blocker date and time   Airway Mallampati: II  TM Distance: >3 FB     Dental  (+) Teeth Intact   Pulmonary Current Smoker,    breath sounds clear to auscultation       Cardiovascular hypertension, Pt. on medications and Pt. on home beta blockers (-) angina+ CAD, + Past MI and + Cardiac Stents   Rhythm:Regular Rate:Normal     Neuro/Psych PSYCHIATRIC DISORDERS Anxiety Depression    GI/Hepatic GERD  Controlled and Medicated,  Endo/Other    Renal/GU      Musculoskeletal  (+) Arthritis ,   Abdominal   Peds  Hematology   Anesthesia Other Findings   Reproductive/Obstetrics                             Anesthesia Physical  Anesthesia Plan  ASA: III  Anesthesia Plan: MAC   Post-op Pain Management:    Induction: Intravenous  Airway Management Planned:   Additional Equipment:   Intra-op Plan:   Post-operative Plan:   Informed Consent: I have reviewed the patients History and Physical, chart, labs and discussed the procedure including the risks, benefits and alternatives for the proposed anesthesia with the patient or authorized representative who has indicated his/her understanding and acceptance.   Dental advisory given  Plan Discussed with: Anesthesiologist and Surgeon  Anesthesia Plan Comments:         Anesthesia Quick Evaluation

## 2016-07-21 ENCOUNTER — Telehealth (HOSPITAL_COMMUNITY): Payer: Self-pay | Admitting: Emergency Medicine

## 2016-07-21 NOTE — Telephone Encounter (Signed)
Make sure pt knew about her new pt appt to see Dr Whitney Muse 07/22/2016 at 3:20pm.  She verbalized understanding.

## 2016-07-22 ENCOUNTER — Encounter (HOSPITAL_COMMUNITY): Payer: Self-pay | Admitting: Surgery

## 2016-07-22 ENCOUNTER — Encounter (HOSPITAL_COMMUNITY): Payer: Commercial Managed Care - HMO | Attending: Hematology & Oncology | Admitting: Hematology & Oncology

## 2016-07-22 ENCOUNTER — Other Ambulatory Visit: Payer: Self-pay | Admitting: General Surgery

## 2016-07-22 ENCOUNTER — Other Ambulatory Visit: Payer: Self-pay | Admitting: Student

## 2016-07-22 VITALS — BP 135/69 | HR 91 | Temp 97.9°F | Resp 18 | Ht 62.0 in | Wt 125.1 lb

## 2016-07-22 DIAGNOSIS — F17200 Nicotine dependence, unspecified, uncomplicated: Secondary | ICD-10-CM

## 2016-07-22 DIAGNOSIS — Z7982 Long term (current) use of aspirin: Secondary | ICD-10-CM | POA: Insufficient documentation

## 2016-07-22 DIAGNOSIS — C8335 Diffuse large B-cell lymphoma, lymph nodes of inguinal region and lower limb: Secondary | ICD-10-CM | POA: Diagnosis present

## 2016-07-22 DIAGNOSIS — Z79899 Other long term (current) drug therapy: Secondary | ICD-10-CM | POA: Insufficient documentation

## 2016-07-22 DIAGNOSIS — F17210 Nicotine dependence, cigarettes, uncomplicated: Secondary | ICD-10-CM | POA: Insufficient documentation

## 2016-07-22 DIAGNOSIS — I252 Old myocardial infarction: Secondary | ICD-10-CM | POA: Diagnosis not present

## 2016-07-22 DIAGNOSIS — Z880 Allergy status to penicillin: Secondary | ICD-10-CM | POA: Diagnosis not present

## 2016-07-22 LAB — CBC WITH DIFFERENTIAL/PLATELET
BASOS PCT: 1 %
Basophils Absolute: 0.1 10*3/uL (ref 0.0–0.1)
Eosinophils Absolute: 0.2 10*3/uL (ref 0.0–0.7)
Eosinophils Relative: 2 %
HEMATOCRIT: 46.5 % — AB (ref 36.0–46.0)
HEMOGLOBIN: 15.7 g/dL — AB (ref 12.0–15.0)
LYMPHS ABS: 3.2 10*3/uL (ref 0.7–4.0)
Lymphocytes Relative: 31 %
MCH: 32 pg (ref 26.0–34.0)
MCHC: 33.8 g/dL (ref 30.0–36.0)
MCV: 94.7 fL (ref 78.0–100.0)
MONO ABS: 0.8 10*3/uL (ref 0.1–1.0)
MONOS PCT: 8 %
NEUTROS ABS: 5.9 10*3/uL (ref 1.7–7.7)
Neutrophils Relative %: 58 %
Platelets: 247 10*3/uL (ref 150–400)
RBC: 4.91 MIL/uL (ref 3.87–5.11)
RDW: 12.9 % (ref 11.5–15.5)
WBC: 10.1 10*3/uL (ref 4.0–10.5)

## 2016-07-22 LAB — COMPREHENSIVE METABOLIC PANEL
ALBUMIN: 4.5 g/dL (ref 3.5–5.0)
ALK PHOS: 90 U/L (ref 38–126)
ALT: 14 U/L (ref 14–54)
ANION GAP: 10 (ref 5–15)
AST: 17 U/L (ref 15–41)
BUN: 14 mg/dL (ref 6–20)
CALCIUM: 9.5 mg/dL (ref 8.9–10.3)
CHLORIDE: 103 mmol/L (ref 101–111)
CO2: 25 mmol/L (ref 22–32)
CREATININE: 0.82 mg/dL (ref 0.44–1.00)
GFR calc Af Amer: >60 mL/min (ref >60)
GFR calc non Af Amer: >60 mL/min (ref >60)
GLUCOSE: 105 mg/dL — AB (ref 65–99)
Potassium: 3.3 mmol/L — ABNORMAL LOW (ref 3.5–5.1)
Sodium: 138 mmol/L (ref 135–145)
Total Bilirubin: 0.5 mg/dL (ref 0.3–1.2)
Total Protein: 7.4 g/dL (ref 6.5–8.1)

## 2016-07-22 LAB — LACTATE DEHYDROGENASE: LDH: 156 U/L (ref 98–192)

## 2016-07-22 LAB — URIC ACID: URIC ACID, SERUM: 4.9 mg/dL (ref 2.3–6.6)

## 2016-07-22 NOTE — Progress Notes (Signed)
[facility]  CONSULT NOTE  Patient Care Team: Celene Squibb, MD as PCP - General (Internal Medicine)  CHIEF COMPLAINTS/PURPOSE OF CONSULTATION:     DLBCL (diffuse large B cell lymphoma) ([place])   07/13/2016 Initial Biopsy    L inguinal biopsy with Dr. 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 Grade (if applicable): High grade. Flow cytometry: [id number] reveals a monoclonal B-cell population with expression of CD10. Immunohistochemical stains: CD20, CD3, CD10, bcl-2, bcl-6, CD21, CD23, Ki-67. Touch preps/imprints: Mixture of large and smaller lymphocytes with irregular nuclear contours. Comments: Sections of lymph node reveal effacement of the architecture by a nodular proliferation of neoplastic appearing follicles. In many of the areas the follicles coalesce and form sheets of large cells. The more formed follicles predominately consist of numerous large centroblasts (>15 per hpf), representing a high grade (3B) follicular component. The diffuse areas are also composed of large cells but lack follicular dendritic networks (CD21, CD23). Immunohistochemistry reveals the atypical lymphocytes are positive for CD20, CD10, and bcl-6. They are negative for bcl-2. Ki-67 is elevated (30% overall). CD3 reveals residual surrounding T-cells. Flow cytometry ([id number]) reveals a monoclonal B-cell population with expression of CD10. 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 The case was called to Dr. Rosana Hoes on 07/16/2016.       HISTORY OF PRESENTING ILLNESS:  Gabriella Love 60 y.o. female is here because of referral from Dr. Tama High for Diffuse large B-cell lymphoma. Staging is not yet complete.    The patient originally presented to see Dr. Tama High of [facility] on 07/08/2016 for the evaluation of a large non-painful firm left groin mass that she noticed 11 days prior while taking a bath.   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  She had a port placed on 07/17/16 with Dr. Rosana Hoes.   She will be losing her insurance on January 1st, unfortunately she notes that she has also just been laid off from her long time job.   [name] presents to the [facility] today accompanied by her boyfriend and daughter.   She denies any recent weight loss, fevers, chills, or appetite change. No B symptoms.  She denies issues with her recently placed port, though it is still tender.   She had a heart attack in 2011. She was initially seen regularly by cardiology but opted to have Dr. Delphina Cahill follow her because cardiology visits were only yearly.   She would like to speak with someone about assistance programs and short term disability.  She has not had a mammogram in a couple years. The last mammogram was unpleasant so she has not been back. She performs self breast exams.  The patient is here for further evaluation and discussion of diffuse large B-cell lymphoma.    MEDICAL HISTORY:  Past Medical History:  Diagnosis Date  . Anxiety   . Arthritis   . DLBCL (diffuse large B cell lymphoma) ([place]) 07/16/2016  . GERD (gastroesophageal reflux disease)   . Heart disease   . Hyperlipidemia   . Hypertension   . Myocardial infarction 12/19/2009   released from cardiology    SURGICAL HISTORY: Past  Surgical History:  Procedure Laterality Date  . CARDIAC CATHETERIZATION     cardiac stent  . CHOLECYSTECTOMY    . CORONARY STENT PLACEMENT  10-05-09  . INGUINAL LYMPH NODE BIOPSY Left 07/13/2016   Procedure: EXCISIONAL BIOPSY OF LEFT INGUINAL LYMPH NODE;  Surgeon: Vickie Epley, MD;  Location: [facility];  Service: General;  Laterality: Left;  . PORTACATH PLACEMENT N/A 07/17/2016   Procedure: INSERTION OF TUNNELED CENTRAL VENOUS CATHETER WITH SUBCUTANEOUS PORT;  Surgeon: Vickie Epley, MD;  Location: [facility];  Service: Vascular;  Laterality: N/A;  . TUBAL LIGATION      SOCIAL HISTORY: Social History   Social History  . Marital status: Single    Spouse name: N/A  . Number of children: N/A  . Years of education: N/A   Occupational History  . Not on file.   Social History Main Topics  . Smoking status: Current Every Day Smoker    Packs/day: 0.50    Years: 40.00    Types: Cigarettes  . Smokeless tobacco: Never Used  . Alcohol use No  . Drug use: No  . Sexual activity: Yes    Birth control/ protection: Post-menopausal   Other Topics Concern  . Not on file   Social History Narrative  . No narrative on file   Been with her boyfriend since Oct 05, 2000 1 daughter 3 grandchildren Smoker, 1 to 1.5 ppd. Recently cut down. Started at 60 y.o. ETOH, rare She enjoys reading She was laid off on December 1st   FAMILY HISTORY: Family History  Problem Relation Age of Onset  . Cancer Mother     colon  . Depression Mother   . Heart disease Father   . Depression Sister   . Cancer Sister     leukemia  . Hyperlipidemia Brother   . Hypertension Brother    Mother deceased in early 49s. Diagnosed in 10/06/1995 with colon cancer and passed in 10-05-1997 Father deceased at 35 years old of heart attack. Sister with leukemia in regression who passed away of congestive heart failure at 59 years-old. She was estranged and they only got back together about 6 months before she passed away. Brother with high blood pressure and high cholesterol. This sibling is also estranged. He smokes and drinks heavily.  ALLERGIES:  is allergic to amoxicillin; choline fenofibrate; oxycodone-acetaminophen; and penicillins.  MEDICATIONS:  Current Outpatient Prescriptions  Medication Sig  Dispense Refill  . acetaminophen (TYLENOL) 650 MG CR tablet Take 650 mg by mouth every 8 (eight) hours as needed for pain.    Marland Kitchen ALPRAZolam (XANAX) 0.5 MG tablet Take 0.5 mg by mouth daily as needed for anxiety.    Marland Kitchen aspirin 325 MG tablet Take 325 mg by mouth daily.      Marland Kitchen buPROPion (WELLBUTRIN XL) 150 MG 24 hr tablet Take 150 mg by mouth every evening.    . fluticasone (FLONASE) 50 MCG/ACT nasal spray Place 1 spray into both nostrils daily as needed for allergies.     Marland Kitchen ketoconazole (NIZORAL) 2 % cream Apply 1 application topically daily. Apply to irritated skin when dry. 30 g 1  . metoprolol succinate (TOPROL-XL) 25 MG 24 hr tablet Take 25 mg by mouth daily.      . nitroGLYCERIN (NITROSTAT) 0.4 MG SL tablet Place 0.4 mg under the tongue every 5 (five) minutes as needed for chest pain.     . pantoprazole (PROTONIX) 40 MG tablet Take 40 mg by mouth every evening.     Marland Kitchen  simvastatin (ZOCOR) 40 MG tablet Take 40 mg by mouth at bedtime.       No current facility-administered medications for this visit.     Review of Systems  Constitutional: Negative.  Negative for chills, fever and weight loss.  HENT: Negative.   Eyes: Negative.   Respiratory: Negative.   Cardiovascular: Negative.   Gastrointestinal: Negative.   Genitourinary: Negative.   Musculoskeletal: Negative.   Skin: Negative.   Neurological: Negative.   Endo/Heme/Allergies: Negative.   Psychiatric/Behavioral: Negative.   All other systems reviewed and are negative. 14 point ROS was done and is otherwise as detailed above or in HPI   PHYSICAL EXAMINATION: ECOG PERFORMANCE STATUS: 1 - Symptomatic but completely ambulatory  Vitals:   07/22/16 1537  BP: 135/69  Pulse: 91  Resp: 18  Temp: 97.9 F (36.6 C)   Filed Weights   07/22/16 1537  Weight: 125 lb 1.6 oz (56.7 kg)    Physical Exam  Constitutional: She is oriented to person, place, and time and well-developed, well-nourished, and in no distress.  HENT:  Head:  Normocephalic and atraumatic.  Mouth/Throat: Oropharynx is clear and moist. No oropharyngeal exudate.  Eyes: Conjunctivae and EOM are normal. Pupils are equal, round, and reactive to light. No scleral icterus.  Neck: Normal range of motion. Neck supple.  Cardiovascular: Normal rate, regular rhythm and normal heart sounds.   No murmur heard. Pulmonary/Chest: Effort normal and breath sounds normal. No respiratory distress. She has no wheezes.  Ecchymosis at port site  Abdominal: Soft. Bowel sounds are normal. She exhibits no distension and no mass. There is no tenderness. There is no rebound and no guarding.  Musculoskeletal: Normal range of motion.  Lymphadenopathy:    She has no cervical adenopathy.  Well healing excision site at left groin. No other lymphadenopathy palpated.  Neurological: She is alert and oriented to person, place, and time. No cranial nerve deficit. Gait normal.  Skin: Skin is warm and dry.  Psychiatric: Mood, memory, affect and judgment normal.  Nursing note and vitals reviewed.    LABORATORY DATA:  I have reviewed the data as listed Lab Results  Component Value Date   WBC 9.2 07/09/2016   HGB 16.1 (H) 07/09/2016   HCT 47.5 (H) 07/09/2016   MCV 95.0 07/09/2016   PLT 186 07/09/2016   CMP     Component Value Date/Time   NA 137 07/09/2016 1100   K 3.8 07/09/2016 1100   CL 104 07/09/2016 1100   CO2 24 07/09/2016 1100   GLUCOSE 95 07/09/2016 1100   BUN 14 07/09/2016 1100   CREATININE 0.78 07/09/2016 1100   CALCIUM 9.6 07/09/2016 1100   PROT 6.6 11/20/2011 2334   ALBUMIN 4.0 11/20/2011 2334   AST 17 11/20/2011 2334   ALT 18 11/20/2011 2334   ALKPHOS 91 11/20/2011 2334   BILITOT 0.2 (L) 11/20/2011 2334   GFRNONAA >60 07/09/2016 1100   GFRAA >60 07/09/2016 1100   PATHOLOGY    RADIOGRAPHIC STUDIES: I have personally reviewed the radiological images as listed and agreed with the findings in the report. No results found.  Study Result   CLINICAL  DATA:  Port-A-Cath placement.  EXAM: PORTABLE CHEST 1 VIEW  COMPARISON:  07/09/2016 .  FINDINGS: PowerPort catheter noted with tip projected over the cavoatrial junction. Heart size normal. Low lung volumes. No pleural effusion or pneumothorax.  IMPRESSION: PowerPort catheter noted with tip at the caval atrial junction . No evidence of pneumothorax. Low lung volumes.   Electronically  Signed   By: Dumont   On: 07/17/2016 09:15    ASSESSMENT & PLAN:  Diffuse large B-cell lymphoma No B symptoms Tobacco use History MI   Labs reviewed. Results are noted above. Additional labs will be drawn after our visit today, these are listed in the orders below.  Pathology results reviewed. Results are noted above.   The patient is here for further evaluation and discussion of diffuse large B-cell lymphoma. We discussed additional imaging and biopsy needed for staging. We also spoke about potential treatment options including, chemotherapy R-CHOP and radiation therapy. She understands that staging must be complete before final treatment planning can be done. We reviewed today the differences between NHL and HL; between indolent and aggressive NHL.   She had a port placed on 07/17/2016 with Dr. Rosana Hoes. She has not received chemotherapy teaching or been instructed on how to numb her port site. She will meet our patient navigator Anderson Malta today. She will be scheduled for chemotherapy teaching.  She will be prescribed EMLA cream and antiemetics.  The patient will speak with Angie about short term disability and assistance programs. She is currently unemployed - laid off on December 1st. Her insurance will run out on January 1st.  She is scheduled for an echocardiogram on 07/23/2016, CT bone marrow biopsy on 07/24/2016 and a PET scan on 07/28/2016.  She will return for follow up post imaging and BMBX to review final treatment planning, answer final questions and begin  therapy.  Smoking cessation was addressed with the patient in detail. We will continue to address moving forward.  Orders Placed This Encounter  Procedures  . Hepatitis B surface antigen  . Hepatitis B core antibody, IgM  . Lactate Dehydrogenase  . CBC With Differential  . Uric acid  . Comprehensive metabolic panel  . CBC with Differential    Standing Status:   Future    Number of Occurrences:   1    Standing Expiration Date:   07/22/2017    All questions were answered. The patient knows to call the clinic with any problems, questions or concerns.  This document serves as a record of services personally performed by Ancil Linsey, MD. It was created on her behalf by Arlyce Harman, a trained medical scribe. The creation of this record is based on the scribe's personal observations and the provider's statements to them. This document has been checked and approved by the attending provider.  I have reviewed the above documentation for accuracy and completeness and I agree with the above.  This note was electronically signed.    Molli Hazard, MD  07/22/2016 3:56 PM

## 2016-07-22 NOTE — Patient Instructions (Addendum)
[facility] at [facility] Discharge Instructions  RECOMMENDATIONS MADE BY THE CONSULTANT AND ANY TEST RESULTS WILL BE SENT TO YOUR REFERRING PHYSICIAN.  You were seen today by Dr. Gustavus Bryant today Anderson Malta will be your liaison for any questions after you leave.  You have been given her card with phone number.  [phone] She will schedule you for your follow up appointments.  Thank you for choosing [facility] at [facility] [place] [facility] to provide your oncology and hematology care.  To afford each patient quality time with our provider, please arrive at least 15 minutes before your scheduled appointment time.   Beginning January 23rd 2017 lab work for the [facility] will be done in the  Main lab at [facility] on 1st floor. If you have a lab appointment with the [facility] please come in thru the  Main Entrance and check in at the main information desk  You need to re-schedule your appointment should you arrive 10 or more minutes late.  We strive to give you quality time with our providers, and arriving late affects you and other patients whose appointments are after yours.  Also, if you no show three or more times for appointments you may be dismissed from the clinic at the providers discretion.     Again, thank you for choosing [facility].  Our hope is that these requests will decrease the amount of time that you wait before being seen by our physicians.       _____________________________________________________________  Should you have questions after your visit to [facility] please contact our office at [phone] between the hours of 8:30 a.m. and 4:30 p.m.  Voicemails left after 4:30 p.m. will not be returned until the following business day.  For prescription refill requests, have your pharmacy contact our office.         Resources For Cancer Patients and their Caregivers ? American Cancer Society: Can  assist with transportation, wigs, general needs, runs Look Good Feel Better.        442-705-5569 ? Cancer Care: Provides financial assistance, online support groups, medication/co-pay assistance.  1-800-813-HOPE (908)187-3946) ? [facility] Assists [place] cancer patients and their families through emotional , educational and financial support.  651-250-5740 ? [place] DSS Where to apply for food stamps, Medicaid and utility assistance. 9198284022 ? [facility]: Transportation to medical appointments. 919-102-2346 ? Social Security Administration: May apply for disability if have a Stage IV cancer. 901-826-1010 657 680 9353 ? [employer], Disability and Transit Services: Assists with nutrition, care and transit needs. [place] Support Programs: @10RELATIVEDAYS @ > Cancer Support Group  2nd Tuesday of the month 1pm-2pm, Journey Room  > Creative Journey  3rd Tuesday of the month 1130am-1pm, Journey Room  > Look Good Feel Better  1st Wednesday of the month 10am-12 noon, Journey Room (Call [place] to register [phone])

## 2016-07-22 NOTE — Progress Notes (Signed)
Met with pt face to face.  Introduced myself and explain a little bit about my role as the patient navigator.  Pt given a card with all my information on it.  Told pt to call if they had any questions or concerns.  Pt verbalized understanding.  Set up for chemotherapy teaching on 07/29/2016 at 11:30 am.  RCHOP.

## 2016-07-23 ENCOUNTER — Encounter (HOSPITAL_COMMUNITY): Payer: Self-pay | Admitting: Hematology & Oncology

## 2016-07-23 ENCOUNTER — Ambulatory Visit (HOSPITAL_COMMUNITY)
Admission: RE | Admit: 2016-07-23 | Discharge: 2016-07-23 | Disposition: A | Discharge status: Home / Self Care | Payer: Commercial Managed Care - HMO | Source: Ambulatory Visit | Attending: Oncology | Admitting: Oncology

## 2016-07-23 ENCOUNTER — Other Ambulatory Visit: Payer: Self-pay | Admitting: Radiology

## 2016-07-23 ENCOUNTER — Other Ambulatory Visit: Payer: Self-pay | Admitting: General Surgery

## 2016-07-23 DIAGNOSIS — Z720 Tobacco use: Secondary | ICD-10-CM | POA: Diagnosis not present

## 2016-07-23 DIAGNOSIS — Z09 Encounter for follow-up examination after completed treatment for conditions other than malignant neoplasm: Secondary | ICD-10-CM | POA: Diagnosis present

## 2016-07-23 DIAGNOSIS — E785 Hyperlipidemia, unspecified: Secondary | ICD-10-CM | POA: Diagnosis not present

## 2016-07-23 DIAGNOSIS — I10 Essential (primary) hypertension: Secondary | ICD-10-CM | POA: Insufficient documentation

## 2016-07-23 DIAGNOSIS — C8335 Diffuse large B-cell lymphoma, lymph nodes of inguinal region and lower limb: Secondary | ICD-10-CM | POA: Diagnosis not present

## 2016-07-23 DIAGNOSIS — I2510 Atherosclerotic heart disease of native coronary artery without angina pectoris: Secondary | ICD-10-CM | POA: Insufficient documentation

## 2016-07-23 LAB — ECHOCARDIOGRAM COMPLETE
AO mean calculated velocity dopler: 90.4 cm/s
AOPV: 0.74 m/s
AOVTI: 29.5 cm
AV Area VTI index: 1.11 cm2/m2
AV Area VTI: 1.89 cm2
AV Area mean vel: 1.9 cm2
AV Mean grad: 4 mmHg
AV VEL mean LVOT/AV: 0.75
AV area mean vel ind: 1.2 cm2/m2
AV peak Index: 1.19
AV pk vel: 128 cm/s
AVA: 1.76 cm2
AVPG: 7 mmHg
CHL CUP AV VEL: 1.76
E decel time: 183 msec
E/e' ratio: 12.28
FS: 36 % (ref 28–44)
IVS/LV PW RATIO, ED: 1.02
LA ID, A-P, ES: 31 mm
LA vol index: 14.1 mL/m2
LA vol: 22.3 mL
LADIAMINDEX: 1.96 cm/m2
LAVOLA4C: 27.8 mL
LDCA: 2.54 cm2
LEFT ATRIUM END SYS DIAM: 31 mm
LV E/e'average: 12.28
LV SIMPSON'S DISK: 59
LV TDI E'LATERAL: 7.62
LV dias vol index: 26 mL/m2
LV e' LATERAL: 7.62 cm/s
LV sys vol: 17 mL (ref 14–42)
LVDIAVOL: 42 mL — AB (ref 46–106)
LVEEMED: 12.28
LVOT SV: 52 mL
LVOT VTI: 20.4 cm
LVOT diameter: 18 mm
LVOT peak VTI: 0.69 cm
LVOT peak grad rest: 4 mmHg
LVOTPV: 95.3 cm/s
LVSYSVOLIN: 11 mL/m2
Lateral S' vel: 11.1 cm/s
MV Dec: 183
MV Peak grad: 4 mmHg
MVPKAVEL: 84.4 m/s
MVPKEVEL: 93.6 m/s
PW: 9.91 mm — AB (ref 0.6–1.1)
Stroke v: 25 ml
TAPSE: 16.5 mm
TDI e' medial: 6.85
Valve area index: 1.11

## 2016-07-23 LAB — HEPATITIS B CORE ANTIBODY, IGM: HEP B C IGM: NEGATIVE

## 2016-07-23 LAB — HEPATITIS B SURFACE ANTIGEN: HEP B S AG: NEGATIVE

## 2016-07-23 NOTE — Progress Notes (Signed)
*  PRELIMINARY RESULTS* Echocardiogram 2D Echocardiogram has been performed.  Samuel Germany 07/23/2016, 11:30 AM

## 2016-07-24 ENCOUNTER — Ambulatory Visit (HOSPITAL_COMMUNITY)
Admission: RE | Admit: 2016-07-24 | Discharge: 2016-07-24 | Disposition: A | Discharge status: Home / Self Care | Payer: Commercial Managed Care - HMO | Source: Ambulatory Visit | Attending: Oncology | Admitting: Oncology

## 2016-07-24 ENCOUNTER — Encounter (HOSPITAL_COMMUNITY): Payer: Self-pay

## 2016-07-24 ENCOUNTER — Encounter (HOSPITAL_COMMUNITY): Payer: Self-pay | Admitting: Emergency Medicine

## 2016-07-24 DIAGNOSIS — C8335 Diffuse large B-cell lymphoma, lymph nodes of inguinal region and lower limb: Secondary | ICD-10-CM

## 2016-07-24 DIAGNOSIS — F17210 Nicotine dependence, cigarettes, uncomplicated: Secondary | ICD-10-CM | POA: Insufficient documentation

## 2016-07-24 DIAGNOSIS — Z880 Allergy status to penicillin: Secondary | ICD-10-CM | POA: Insufficient documentation

## 2016-07-24 LAB — PROTIME-INR
INR: 0.97
PROTHROMBIN TIME: 12.8 s (ref 11.4–15.2)

## 2016-07-24 LAB — CBC
HCT: 44.5 % (ref 36.0–46.0)
HEMOGLOBIN: 15.2 g/dL — AB (ref 12.0–15.0)
MCH: 31.5 pg (ref 26.0–34.0)
MCHC: 34.2 g/dL (ref 30.0–36.0)
MCV: 92.1 fL (ref 78.0–100.0)
PLATELETS: 247 10*3/uL (ref 150–400)
RBC: 4.83 MIL/uL (ref 3.87–5.11)
RDW: 12.9 % (ref 11.5–15.5)
WBC: 9.7 10*3/uL (ref 4.0–10.5)

## 2016-07-24 LAB — APTT: aPTT: 32 seconds (ref 24–36)

## 2016-07-24 LAB — BONE MARROW EXAM

## 2016-07-24 MED ORDER — LIDOCAINE-PRILOCAINE 2.5-2.5 % EX CREA: TOPICAL_CREAM | CUTANEOUS | 2 refills | Status: DC

## 2016-07-24 MED ORDER — FENTANYL CITRATE (PF) 100 MCG/2ML IJ SOLN
INTRAMUSCULAR | Status: AC
  Filled 2016-07-24: qty 6

## 2016-07-24 MED ORDER — FENTANYL CITRATE (PF) 100 MCG/2ML IJ SOLN
INTRAMUSCULAR | Status: AC | PRN
  Administered 2016-07-24 (×2): 50 ug via INTRAVENOUS

## 2016-07-24 MED ORDER — MIDAZOLAM HCL 2 MG/2ML IJ SOLN
INTRAMUSCULAR | Status: AC | PRN
  Administered 2016-07-24 (×2): 1 mg via INTRAVENOUS

## 2016-07-24 MED ORDER — MIDAZOLAM HCL 2 MG/2ML IJ SOLN
INTRAMUSCULAR | Status: AC
  Filled 2016-07-24: qty 6

## 2016-07-24 MED ORDER — ONDANSETRON HCL 8 MG PO TABS: 8.0000 mg | ORAL_TABLET | Freq: Three times a day (TID) | ORAL | 2 refills | Status: DC | PRN

## 2016-07-24 MED ORDER — SODIUM CHLORIDE 0.9 % IV SOLN
INTRAVENOUS | Status: DC
  Administered 2016-07-24: 07:00:00 via INTRAVENOUS

## 2016-07-24 MED ORDER — PROCHLORPERAZINE MALEATE 10 MG PO TABS: 10.0000 mg | ORAL_TABLET | Freq: Four times a day (QID) | ORAL | 2 refills | Status: DC | PRN

## 2016-07-24 NOTE — Procedures (Signed)
CT-guided  R iliac bone marrow aspiration and core biopsy No complication No blood loss. See complete dictation in [facility] PACS  

## 2016-07-24 NOTE — Progress Notes (Signed)
Chemotherapy pulled together. Labs entered. Meds escibed. Chemo-doctors appts made.

## 2016-07-24 NOTE — Progress Notes (Signed)
Pt. Refused for RN to access port-a-cath., pt. Stated, " It's sore", RN offered to place ice pack over site, pt. Refused. Peripheral IV started.

## 2016-07-24 NOTE — Discharge Instructions (Signed)
Bone Marrow Aspiration and Bone Marrow Biopsy, Adult, Care After °This sheet gives you information about how to care for yourself after your procedure. Your health care provider may also give you more specific instructions. If you have problems or questions, contact your health care provider. °What can I expect after the procedure? °After the procedure, it is common to have: °· Mild pain and tenderness. °· Swelling. °· Bruising. °Follow these instructions at home: °· Take over-the-counter or prescription medicines only as told by your health care provider. °· Do not take baths, swim, or use a hot tub until your health care provider approves. Ask if you can take a shower or have a sponge bath. °· Follow instructions from your health care provider about how to take care of the puncture site. Make sure you: °¨ Wash your hands with soap and water before you change your bandage (dressing). If soap and water are not available, use hand sanitizer. °¨ Change your dressing as told by your health care provider. °· Check your puncture site every day for signs of infection. Check for: °¨ More redness, swelling, or pain. °¨ More fluid or blood. °¨ Warmth. °¨ Pus or a bad smell. °· Return to your normal activities as told by your health care provider. Ask your health care provider what activities are safe for you. °· Do not drive for 24 hours if you were given a medicine to help you relax (sedative). °· Keep all follow-up visits as told by your health care provider. This is important. °Contact a health care provider if: °· You have more redness, swelling, or pain around the puncture site. °· You have more fluid or blood coming from the puncture site. °· Your puncture site feels warm to the touch. °· You have pus or a bad smell coming from the puncture site. °· You have a fever. °· Your pain is not controlled with medicine. °This information is not intended to replace advice given to you by your health care provider. Make sure you  discuss any questions you have with your health care provider. °Document Released: 01/30/2005 Document Revised: 01/31/2016 Document Reviewed: 12/25/2015 °[employer] Interactive Patient Education © 2017 [employer] °Moderate Conscious Sedation, Adult, Care After °These instructions provide you with information about caring for yourself after your procedure. Your health care provider may also give you more specific instructions. Your treatment has been planned according to current medical practices, but problems sometimes occur. Call your health care provider if you have any problems or questions after your procedure. °What can I expect after the procedure? °After your procedure, it is common: °· To feel sleepy for several hours. °· To feel clumsy and have poor balance for several hours. °· To have poor judgment for several hours. °· To vomit if you eat too soon. °Follow these instructions at home: °For at least 24 hours after the procedure:  °· Do not: °¨ Participate in activities where you could fall or become injured. °¨ Drive. °¨ Use heavy machinery. °¨ Drink alcohol. °¨ Take sleeping pills or medicines that cause drowsiness. °¨ Make important decisions or sign legal documents. °¨ Take care of children on your own. °· Rest. °Eating and drinking °· Follow the diet recommended by your health care provider. °· If you vomit: °¨ Drink water, juice, or soup when you can drink without vomiting. °¨ Make sure you have little or no nausea before eating solid foods. °General instructions °· Have a responsible adult stay with you until you are awake and   alert.  Take over-the-counter and prescription medicines only as told by your health care provider.  If you smoke, do not smoke without supervision.  Keep all follow-up visits as told by your health care provider. This is important. Contact a health care provider if:  You keep feeling nauseous or you keep vomiting.  You feel light-headed.  You develop a  rash.  You have a fever. Get help right away if:  You have trouble breathing. This information is not intended to replace advice given to you by your health care provider. Make sure you discuss any questions you have with your health care provider. Document Released: 05/03/2013 Document Revised: 12/16/2015 Document Reviewed: 11/02/2015 [employer] Interactive Patient Education  2017 [employer].

## 2016-07-24 NOTE — Patient Instructions (Signed)
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  We are going to treat you with a combination of drugs called RCHOP.  These include cytoxan, adriamycin, vincristine, neulasta and rituxan.  You will receive this chemotherapy every 21 days.  21 days is 1 cycle.  You will have 6 cycles.  This is with curative intent.   You will see the doctor regularly throughout treatment.  We monitor your lab work prior to every treatment.  The doctor monitors your response to treatment by the way you are feeling, your blood work, and scans periodically.  You will receive premedications prior to chemotherapy.  These include: Premeds: Tylenol and Benadryl:  Given to help prevent any type of reaction to chemotherapy.  Aloxi - high powered nausea/vomiting prevention medication used for chemotherapy patients. Dexamethasone - steroid - given to reduce the risk of you having an allergic type reaction to the chemotherapy. Dex can cause you to feel energized, nervous/anxious/jittery, make you have trouble sleeping, and/or make you feel hot/flushed in the face/neck and/or look pink/red in the face/neck. These side effects will pass as the Dex wears off. (takes 20 minutes to infuse)   POTENTIAL SIDE EFFECTS OF TREATMENT:  Cyclophosphamide (Generic Name) Other Names: Cytoxan, Neosar  About This Drug Cyclophosphamide is a drug used to treat cancer. It is given in the vein (IV) or by mouth. This drug takes 30 minutes to infuse.  Possible Side Effects (More Common)  Nausea and throwing up (vomiting). These symptoms may happen within a few hours after your treatment and may last up to 72 hours. Medicines are available to stop or lessen these side effects.  Bone marrow depression. This is a decrease in the number of white blood cells, red blood cells, and platelets. This may raise your risk of infection, make you tired and weak  (fatigue), and raise your risk of bleeding.  Hair loss: You may notice hair getting thin. Some patients lose their hair. Hair loss is often complete scalp hair loss and can involve loss of eyebrows, eyelashes, and pubic hair. You may notice this a few days or weeks after treatment has started. Most often hair loss is temporary; your hair should grow back when treatment is done.  Decreased appetite (decreased hunger)  Blurred vision  Soreness of the mouth and throat. You may have red areas, white patches, or sores that hurt.  Effects on the bladder. This drug may cause irritation and bleeding in the bladder. You may have blood in your urine. To help stop this, you will get extra fluids to help you pass more urine. You may get a drug called mesna, which helps to decrease irritation and bleeding. You may also get a medicine to help you pass more urine. You may have a catheter (tube) placed in your bladder so that your bladder will be washed with this drug.  Possible Side Effects (Less Common)  Darkening of the skin or nails  Metallic taste in the mouth  Changes in lung tissue may happen with large amounts of this drug. These changes may not last forever, and your lung tissue may go back to normal. Sometimes these changes may not be seen for many years. You may get a cough or have trouble catching your breath.  Allergic Reactions Serious allergic reactions including anaphylaxis are rare. While you are getting this drug in your vein (IV), tell your nurse right away if you have any  of these symptoms of an allergic reaction:  Trouble catching your breath  Feeling like your tongue or throat are swelling  Feeling your heart beat quickly or in a not normal way (palpitations)  Feeling dizzy or lightheaded  Flushing, itching, rash, and/or hives  Treating Side Effects  Drink 6-8 cups of fluids each day unless your doctor has told you to limit your fluid intake due to some other  health problem. A cup is 8 ounces of fluid. If you throw up or have loose bowel movements you should drink more fluids so that you do not become dehydrated (lack water in the body due to losing too much fluid).  Ask your doctor or nurse about medicine that is available to help stop or lessen nausea or throwing up.  Mouth care is very important. Your mouth care should consist of routine, gentle cleaning of your teeth or dentures and rinsing your mouth with a mixture of 1/2 teaspoon of salt in 8 ounces of water or  teaspoon of baking soda in 8 ounces of water. This should be done at least after each meal and at bedtime.  If you have mouth sores, avoid mouthwash that has alcohol. Also avoid alcohol and smoking because they can bother your mouth and throat.  Talk with your nurse about getting a wig before you lose your hair. Also, call the [facility] at 800-ACS-2345 to find out information about the  [employer] program close to where you live. It is a free program where women undergoing chemotherapy learn about wigs, turbans and scarves as well as makeup techniques and skin and nail care. Important Information  Whenever you tell a doctor or nurse your health history, always tell them that you have received cyclophosphamide in the past.  If you take this drug by mouth swallow the medicine whole. Do not chew, break or crush it.  You can take the medicine with or without food. If you have nausea, take it with food. Do not take the pills at bedtime.  Food and Drug Interactions There are no known interactions of cyclophosphamide with food. This drug may interact with other medicines. Tell your doctor and pharmacist about all the medicines and dietary supplements (vitamins, minerals, herbs and others) that you are taking at this time. The safety and use of dietary supplements and alternative diets are often not known. Using these might affect your cancer or  interfere with your treatment. Until more is known, you should not use dietary supplements or alternative diets without your cancer doctor's help. When to Call the Doctor Call your doctor or nurse right away if you have any of these symptoms:  Fever of 100.5 F (38 C) or higher  Chills  Bleeding or bruising that is not normal  Blurred vision or other changes in eyesight  Pain when passing urine; blood in urine  Pain in your lower back or side  Wheezing or trouble breathing  Swelling of legs, ankles, or feet  Feeling dizzy or lightheaded  Feeling confused or agitated  Signs of liver problems: dark urine, pale bowel movements, bad stomach pain, feeling very tired and weak, unusual itching, or yellowing of the eyes or skin  Unusual thirst or passing urine often  Nausea that stops you from eating or drinking  Throwing up more than 3 times a day Call your doctor or nurse as soon as possible if any of these symptoms happen:  Pain in your mouth or throat that makes it hard  to eat or drink  Nausea not relieved by prescribed medicines  Sexual Problems and Reproductive Concerns  Infertility warning: Sexual problems and reproduction concerns may happen. In both men and women, this drug may affect your ability to have children. This cannot be determined before your treatment. Talk with your doctor or nurse if you plan to have children. Ask for information on sperm or egg banking.  In men, this drug may interfere with your ability to make sperm, but it should not change your ability to have sexual relations.  In women, menstrual bleeding may become irregular or stop while you are getting this drug. Do not assume that you cannot become pregnant if you do not have a menstrual period.  Women may go through signs of menopause (change of life) like vaginal dryness or itching. Vaginal lubricants can be used to lessen vaginal dryness, itching, and pain during sexual relations.   Genetic counseling is available for you to talk about the effects of this drug therapy on future pregnancies. Also, a genetic counselor can look at the possible risk of problems in the unborn baby due to this medicine if an exposure happens during pregnancy.  Pregnancy warning: This drug may have harmful effects on the unborn child, so effective methods of birth control should be used during your cancer treatment.  Breast feeding warning: Women should not breast feed during treatment because this drug could enter the breast milk and badly harm a breast feeding baby    Doxorubicin (Generic Name) Other Names: Adriamycin, hydroxyl daunorubicin  About This Drug Doxorubicin is a drug used to treat cancer. This drug is given in the vein (IV).  This is an IV push that will take about 5-10 minutes.  Possible Side Effects (More Common)  Bone marrow depression. This is a decrease in the number of white blood cells, red blood cells, and platelets. This may raise your risk of infection, make you tired and weak (fatigue), and raise your risk of bleeding.  Hair loss: Hair loss is often complete scalp hair loss and can involve loss of eyebrows, eyelashes, and pubic hair. You may notice this a few days or weeks after treatment has started. Most often hair loss is temporary; your hair should grow back when treatment is done.  Nausea and throwing up (vomiting). These symptoms may happen within a few hours after your treatment and may last up to 24 hours. Medicines are available to stop or lessen these side effects.  Soreness of the mouth and throat. You may have red areas, white patches, or sores that hurt.  Change in the color of your urine to pink or red. This color change will go away in one to two days.  Effects on the heart: This drug can weaken the heart and lower heart function. Your heart function will be checked as needed. You may have trouble catching your breath, mainly during  activities. You may also have trouble breathing while lying down, and have swelling in your ankles.  Sensitivity to light (photosensitivity). Photosensitivity means that you may become more sensitive to the effects of the sun, sun lamps, and tanning beds. Your eyes may water more, mostly in bright light.  Metallic taste in the mouth: This may change the taste of food and drinks  Decreased appetite (decreased hunger)  Darkening of the skin or nails  Weakness that interferes with your daily activities  Possible Side Effects (Less Common)  Skin and tissue irritation may involve redness, pain, warmth, or swelling  at the IV site. This happens if the drug leaks out of the vein and into nearby tissue.  Changes in your liver function. Your doctor will check your liver function as needed.  This drug may cause an increased risk of developing a second cancer  Allergic Reaction Serious allergic reactions, including anaphylaxis are rare. While you are getting this drug in your vein (IV), tell your nurse right away if you have any of these symptoms of an allergic reaction:  Trouble catching your breath  Feeling like your tongue or throat are swelling  Feeling your heart beat quickly or in a not normal way (palpitations)  Feeling dizzy or lightheaded  Flushing, itching, rash, and/or hives  Treating Side Effects  Drink 6-8 cups of fluids every day unless your doctor has told you to limit your fluid intake due to some other health problem. A cup is 8 ounces of fluid. If you vomit or have diarrhea, you should drink more fluids so that you do not become dehydrated (lack water in the body due to losing too much fluid).  Ask your doctor or nurse about medicine that is available to help stop or lessen nausea, throwing up, and/or loose bowel movements  Wear dark sunglasses and use sunscreen with SPF 30 or higher when you are outdoors even for a short time. Cover up when you are out in the  sun. Wear wide-brimmed hats, long-sleeved shirts, and pants. Keep your neck, chest, and back covered.  Mouth care is very important. Your mouth care should consist of routine, gentle cleaning of your teeth or dentures and rinsing your mouth with a mixture of 1/2 teaspoon of salt in 8 ounces of water or  teaspoon of baking soda in 8 ounces of water. This should be done at least after each meal and at bedtime.  If you have mouth sores, avoid mouthwash that has alcohol. Avoid alcohol and smoking because they can bother your mouth and throat.  Talk with your nurse about getting a wig before you lose your hair. Also, call the [facility] at 800-ACS-2345 to find out information about the Look Good, Feel Better program close to where you live. It is a free program where women getting chemotherapy can learn about wigs, turbans and scarves as well as makeup techniques and skin and nail care.  While you are getting this drug, please tell your nurse right away if you have any pain, redness, or swelling at the site of the IV infusion.  Food and Drug Interactions There are no known interactions of doxorubicin with food. This drug may interact with other medicines. Tell your doctor and pharmacist about all the medicines and dietary supplements (vitamins, minerals, herbs and others) that you are taking at this time. The safety and use of dietary supplements and alternative diets are often not known. Using these might affect your cancer or interfere with your treatment. Until more is known, you should not use dietary supplements or alternative diets without your cancer doctor's help.  When to Call the Doctor Call your doctor or nurse right away if you have any of these symptoms:  Fever of 100.5 F (38 C) or above  Chills  Easy bruising or bleeding  Wheezing or trouble breathing  Rash or itching  Feeling dizzy or lightheaded  Feeling that your heart is beating in a fast or not  normal way (palpitations)  Loose bowel movements (diarrhea) more than 4 times a day or diarrhea with weakness or feeling lightheaded  Nausea that stops you from eating or drinking  Throwing up more than 3 times a day  Signs of liver problems: dark urine, pale bowel movements, bad stomach pain, feeling very tired and weak, unusual itching, or yellowing of the eyes or skin,  During the IV infusion, if you have pain, redness, or swelling at the site of the IV infusion, please tell your nurse right away Call your doctor or nurse as soon as possible if any of these symptoms happen:  Decreased urine  Pain in your mouth or throat that makes it hard to eat or drink  Nausea and throwing up that is not relieved by prescribed medicines  Rash that is not relieved by prescribed medicines  Swelling of legs, ankles, or feet  Weight gain of 5 pounds in one week (fluid retention)  Lasting loss of appetite or rapid weight loss of five pounds in a week  Fatigue that interferes with your daily activities  Extreme weakness that interferes with normal activities  Sexual Problems and Reproduction Concerns  Infertility warning: Sexual problems and reproduction concerns may happen. In both men and women, this drug may affect your ability to have children. This cannot be determined before your treatment. Talk with your doctor or nurse if you plan to have children. Ask for information on sperm or egg banking.  In men, this drug may interfere with your ability to make sperm, but it should not change your ability to have sexual relations.  In women, menstrual bleeding may become irregular or stop while you are getting this drug. Do not assume that you cannot become pregnant if you do not have a menstrual period.  Women may go through signs of menopause (change of life) like vaginal dryness or itching. Vaginal lubricants can be used to lessen vaginal dryness, itching, and pain during  sexual relations.  Genetic counseling is available for you to talk about the effects of this drug therapy on future pregnancies. Also, a genetic counselor can look at the possible risk of problems in the unborn baby due to this medicine if an exposure happens during pregnancy.  Pregnancy warning: This drug may have harmful effects on the unborn baby, so effective methods of birth control should be used by you and your partner during your cancer treatment and for at least 6 months after treatment  Breast feeding warning: Women should not breast feed during treatment because this drug could enter the breast milk and badly harm a breast feeding baby.   Pegfilgrastim (Generic Name) Other Names: Neulasta  About This Drug Pegfilgrastim is used after chemotherapy to help your body make more white blood cells.. It is given by a shot under the skin (subcutaneously).  Possible Side Effects (More Common)  Bone Pain  Swelling of your legs, ankles and/or feet  Nausea and throwing up (vomiting)  Possible Side Effects (Less Common)  Constipation (not able to move bowels)  Treating Side Effects  Drink 6-8 cups of fluids each day unless your doctor has told you to limit your fluid intake due to some other health problem. A cup is 8 ounces of fluid. If you throw up or have loose bowel movements, you should drink more fluids so that you do not become dehydrated (lack water in the body from losing too much fluid).  Ask your doctor or nurse about medicine that is available to help prevent or lessen bone, joint, and muscle pain.  Ask your doctor or nurse about medicine that is available to help  prevent or lessen nausea and throwing up.  If you are constipated, check with your doctor or nurse before you use enemas, laxatives, or suppositories.  Important Information  If you are getting pegfilgrastim at home, you will get directions telling you how to give the shot.  Store the  pre-filled syringes in the refrigerator. They should be kept in their carton to protect them from light until you use them.  Do not shake the syringe.  You may remove a syringe from the refrigerator to let it reach room temperature but it should be protected from light. Do not leave a syringe out of the refrigerator for more than 48 hours. If you leave a syringe out for more than 48 hours, throw it away.  Do not freeze the syringes. If a syringe is freezes by accident, it can be thawed in the refrigerator. If the drug is frozen a second time, do not use it, throw it away.  Food and Drug Interactions There are no known interactions of pegfilgrastim with food. This drug may interact with other medicines. Tell your doctor and pharmacist about all the medicines and dietary supplements (vitamins, minerals, herbs and others) that you are taking at this time. The safety and use of dietary supplements and alternative diets are often not known. Using these might affect your cancer or interfere with your treatment. Until more is known, you should not use dietary supplements or alternative diets without your cancer doctor's help.  When to Call the Doctor Call your doctor or nurse right away if you have any of these symptoms:  Rash, fever, chills, dizziness, fast heartbeat (palpitations), and/or feeling short of breath after you get a dose of this medicine  Fever of 100.5 F (38 C) or higher  Chills  Pain in your left upper abdomen or shoulder  Nausea that stops you from eating or drinking  Throwing up more than 3 times in one day Call your doctor or nurse as soon as possible if you have any of these symptoms:  Nausea, throwing up, or diarrhea (loose bowel movements) that is not relieved by prescribed medicines  Bone, joint, or muscle pain that is not relieved by prescribed medicines  Swelling in the legs, feet, or ankles  Loose bowel movements (diarrhea) 5 or 6 times in one day, or  diarrhea with weakness  Reproduction Concerns  Pregnancy warning: It is not known if this drug may harm an unborn child. For this reason, be sure to talk with your doctor if you are pregnant or planning to become pregnant while getting this drug.  Breast feeding warning: It is not known if this drug passes into breast milk. For this reason, women should talk to their doctor about the risks and benefits of breast feeding during treatment with this drug because this drug may enter the breast milk and badly harm a breast feeding baby.   Rituximab (Generic Name) Other Name: Rituxan  About This Drug Rituximab is a monoclonal antibody used to treat cancer. This drug is given in the vein (IV).  Possible Side Effects (More Common)  Bone marrow depression. This is a decrease in the number of white blood cells, red blood cells, and platelets. This may raise your risk of infection, make you tired and weak (fatigue), and raise your risk of bleeding.  Rash-skin irritation, redness or itching (dermatitis)  Flu-like symptoms: fever, headache, muscle and joint aches, and fatigue (low energy, feeling weak)  Infusion-related reactions  Hepatitis B - if you have  ever had hepatitis B, the virus may come back during treatment with this drug. Your doctor will test to see if you have ever had hepatitis B prior to your treatment.  Changes in your central nervous system can happen. The central nervous system is made up of your brain and spinal cord. You could feel: extreme tiredness, agitation, confusion, or have: hallucinations (see or hear things that are not there), trouble understanding or speaking, loss of control of your bowels or bladder, eyesight changes, numbness or lack of strength to your arms, legs, face, or body, seizures or coma. If you start to have any of these symptoms let your doctor know right away.  Tumor lysis: This drug may act on the cancer cells very quickly. This may affect  how your kidneys work. Your doctor will monitor your kidney function.  Changes in your liver function. Your doctor will check your liver function as needed.  Nausea and throwing up (vomiting): these symptoms may happen within a few hours after your treatment and may last up to 24 hours. Medicines are available to stop or lessen these side effects.  Loose bowel movements (diarrhea) that may last for a few days  Abdominal pain  Infections  Cough, runny nose  Swelling of your legs, ankles and/or feet or hands  High blood pressure Your doctor will check your blood pressure as needed.  Abnormal heart beat  Possible Side Effects (Less Common)  Shortness of breath  Soreness of the mouth and throat. You may have red areas, white patches, or sores that hurt.  Infusion Reactions Infusion Reactions are the most common side effect linked to use of this drug and can be quite severe. Medicines will be given before you get the drug to lower the severity of this side effect. The infusion reactions are the worse with the first dose of the drug and become less severe with more doses of the drug. While you are getting this drug in your vein (IV), tell your nurse right away if you have any of these symptoms of an allergic reaction:  Trouble catching your breath  Feeling like your tongue or throat are swelling  Feeling your heart beat quickly or in a not normal way (palpitations)  Feeling dizzy or lightheaded  Flushing, itching, rash, and/or hives  Treating Side Effects  Ask your doctor or nurse about medicine to stop or lessen headache, loose bowel movements (diarrhea), constipation, nausea, throwing up (vomiting), or pain.  If you get a rash do not put anything it unless your doctor or nurse says you may. Keep the area around the rash clean and dry. Ask your doctor for medicine if the rash bothers you.  Drink 6-8 cups of fluids each day unless your doctor has told you to limit your  fluid intake due to some other health problem. A cup is 8 ounces of fluid. If you throw up or have loose bowel movements, you should drink more fluids so that you do not become dehydrated (lack of water in the body from losing too much fluid).  If you are not able to move your bowels, check with your doctor or nurse before you use enemas, laxatives, or suppositories  If you have mouth sores, avoid mouthwash that has alcohol. Also avoid alcohol and smoking because they can bother your mouth and throat.  If you have a nose bleed, sit with your head tipped slightly forward. Apply pressure by lightly pinching the bridge of your nose between your thumb and  forefinger. Call your doctor if you feel dizzy or faint or if the bleeding doesnt stop after 10 to 15 minutes  Important Information  After treatment with this drug, vaccination with live viruses should be delayed until the immune system recovers.  Symptoms of abnormal bleeding may be: coughing up blood, throwing up blood (may look like coffee grounds), red or black, tarry bowel movements, blood in urine, abnormally heavy menstrual flow, nosebleeds, or any unusual bleeding.  Symptoms of high blood pressure may be: headache, blurred vision, confusion, chest pain, or a feeling that your heart is beat differently.  Urinary tract infection. Symptoms may include:  Pain or burning when you pass urine  Feeling like you have to pass urine often, but not much comes out when you do.  Tender or heavy feeling in your lower abdomen  Cloudy urine and/or urine that smells bad.  Pain on one side of your back under your ribs. This is where your kidneys are.  Fever, chills, nausea and/or throwing up  Food and Drug Interactions There are no known interactions of rituximab and any food. This drug may interact with other medicines. Tell your doctor and pharmacist about all the medicines and dietary supplements (vitamins, minerals, herbs and  others) that you are taking at this time. The safety and use of dietary supplements and alternative diets are often not known. Using these might affect your cancer or interfere with your treatment. Until more is known, you should not use dietary supplements or alternative diets without your cancer doctor's help.  When to Call the Doctor Call your doctor or nurse right away if you have any of these symptoms:  Fever of 100.5 F (38 C) higher  Chills  Trouble breathing  Rash with or without itching  Blistering or peeling of skin  Chest pain or symptoms of a heart attack. Most heart attacks involve pain in the center of the chest that lasts more than a few minutes. The pain may go away and come back or it can be constant. It can feel like pressure, squeezing, fullness, or pain. Sometimes pain is felt in one or both arms, the back, neck, jaw, or stomach. If any of these symptoms last 2 minutes, call 911  Easy bleeding or bruising  Blood in urine or bowel movements  Feeling that your heart is beating in a fast or not normal way (palpitations)  Nausea that stops you from eating or drinking  Throwing up (vomiting) more than 3 times in one day  Abdominal pain  Loose bowel movements (diarrhea) 4 times in one day or diarrhea with weakness or lightheadedness  No bowel movement in 3 days or if you feel uncomfortable  Feeling dizzy or lightheaded  Changes in your speech or vision  Feeling confused  Weakness of your arms and legs or poor coordination (feeling clumsy)  Signs of liver problems: dark urine, pale bowel movements, bad stomach pain, feeling very tired and weak, unusual itching, or yellowing of skin or eyes  Symptoms of a urinary tract infection (see important information) Call your doctor or nurse as soon as possible if you have any of these symptoms:  Swelling of your legs, ankles and/or feet  Fatigue and /or weakness that interferes with your daily activities   Joint and muscle pain or muscle spasms that are not relieved by prescribed medicines  Cough that lasts longer than normal  Reproduction Concerns  Pregnancy warning: This drug is known to cross the placenta. This drug may have  harmful effects on an unborn baby. Effective methods of birth control should be used during treatment with this drug and for 12 months after the last treatment. If exposure occurs to an unborn baby, the babys immune system may be affected, which could last for months after birth. Until the immune system recovers, live vaccines should not be administered to the baby. Be sure to talk with your doctor if you are pregnant or planning to become pregnant while getting this drug.  Breast feeding warning: It is not known if rituximab is passed into human breast milk. In animal studies, this drug was detected in in breast milk. For this reason, women should talk to their doctor about the risks and benefits of breast feeding during treatment with this drug because this drug may enter the breast milk and badly harm a breast feeding baby.    Vincristine sulfate (Generic Name) Other Names: Oncovin, Vincasar, VCR  About This Drug Vincristine is a drug used to treat cancer. It is given in the vein (IV).  This drug will take 10 minutes to infuse.  Possible Side Effects (More Common)  Constipation (not able to move bowels)  Hair loss. You may notice hair getting thin. Some patients lose their hair. Your hair often grows back when treatment is done. Most often hair loss is temporary; your hair should grow back when treatment is done.  Effects on the nerves are called peripheral neuropathy. You may feel numbness, tingling, or pain in your hands and feet. It may be hard for you to button your clothes, open jars, or walk as usual. The effect on the nerves may get worse with more doses of the drug. These effects get better in some people after the drug is stopped but it does  not get better in all people.  Possible Side Effects (Less Common)  Soreness of the mouth and throat. You may have red areas, white patches, or sores that hurt.  Swelling of your legs, ankles, and/or feet  Skin and tissue irritation may involve redness, pain, warmth, or swelling at the IV site. This happens if the drug leaks out of the vein and into nearby tissue.  Blood pressure changes. Some patients have low blood pressure and some patients have high blood pressure.  Jaw pain  Hoarseness  Blurred or double vision or other changes in eyesight may happen but are a rare side effect.  Feeling dizzy or lightheaded  Drooping eyelids are a rare side effect.  Depression  Rash  Bone marrow depression. This is a decrease in the number of white blood cells, red blood cells, and platelets. This may raise your risk of infection, make you tired and weak (fatigue), and raise your risk of bleeding.  Chest pain or symptoms of a heart attack. Most heart attacks involve pain in the center of the chest that lasts more than a few minutes. The pain may go away and come back or it can be constant. It can feel like pressure, squeezing, fullness, or pain. Sometimes pain is felt in one or both arms, the back, neck, jaw, or stomach. If any of these symptoms last 2 minutes, call 911.  Trouble sleeping  Lack of appetite; weight loss  Allergic Reactions Serious allergic reactions including anaphylaxis are rare. While you are getting this drug in your vein (IV), tell your nurse right away if you have any of these symptoms of an allergic reaction:  Trouble catching your breath  Feeling like your tongue or throat are  swelling  Feeling your heart beat quickly or in a not normal way (palpitations)  Feeling dizzy or lightheaded  Flushing, itching, rash, and/or hives  Treating Side Effects  While you are getting this drug in your IV, please tell your nurse right away if you have any  pain, redness, or swelling at the site of the IV infusion.  Ask your doctor or nurse how to prevent or lessen constipation. Constipation can become a serious side effect. If you are constipated, check with your doctor or nurse before you use enemas, laxatives, or suppositories.  Drink 6-8 cups of fluids each day unless your doctor has told you to limit your fluid intake due to some other health problem. A cup is 8 ounces of fluid. If you throw up or have loose bowel movements, you should drink more fluids so that you do not become dehydrated (lack water in the body from losing too much fluid).  Mouth care is very important. Your mouth care should consist of gently cleaning your teeth or dentures and rinsing your mouth with a mixture of  teaspoon salt in 8 ounces of water or  teaspoon sodium bicarbonate (baking soda) in 8 ounces of water. This should be done at least after each meal and at bedtime.  If you have mouth sores, avoid mouthwash that has alcohol. Also avoid alcohol and smoking because they can bother your mouth and throat.  If you get a rash do not put anything on it unless your doctor or nurse says you may. Keep the area around the rash clean and dry. Ask your doctor for medicine if your rash bothers you.  If you have numbness and tingling in your hands and feet, be careful when cooking, walking, and handling sharp objects and hot liquids.  Talk with your nurse about getting a wig before you lose your hair. Also, call the [facility] at [phone] to find out information about the Look Good, Feel Better program close to where you live. It is a free program where women getting chemotherapy can learn about wigs, turbans and scarves as well as makeup techniques and skin and nail care.  Food and Drug Interactions  There are known interactions of Vincristine with grapefruit. Do not eat grapefruit or drink grapefruit juice while taking this drug.  Check with  your doctor or pharmacist about all other prescription medicines and dietary supplements you are taking before starting this medicine as there are a lot of known drug interactions with Vincristine. Also, check with your doctor or pharmacist before starting any new prescription, over-the-counter medicines, or dietary supplement to make sure that there are no interactions.  When to Call the Doctor Call your doctor or nurse right away if you have any of these symptoms:  Redness, pain, warmth, or swelling at the IV site while you are getting this drug  No bowel movement in 3 days or when you feel uncomfortable.  Abdominal pain  Fever of 100.5 F (38 C) or higher  Chills  Feeling short of breath  Easy bleeding or bruising  Jaw pain  Hoarseness  Drooping eyelids  Blurred vision or other changes in eyesight  Feeling confused, restless, or irritable  Seizures (Common symptoms of a seizure can include confusion, blacking out, passing out, loss of hearing or vision, blurred vision, unusual smells or tastes (such as burning rubber), trouble talking, tremors or shaking in parts or all of the body, repeated body movements, tense muscles that do not relax, and loss  of control of urine and bowels. There are other less common symptoms of seizures. If you or your family member suspects you are having a seizure, call 911 right away).  Hallucinations (seeing, hearing, or feeling things that are not really there)  Feeling  Rash  Chest pain or symptoms of a heart attack. Most heart attacks involve pain in the center of the chest that lasts more than a few minutes. The pain may go away and come back. It can feel like pressure, squeezing, fullness, or pain. Sometimes pain is felt in one or both arms, the back, neck, jaw, or stomach. If any of these symptoms last 2 minutes, call 911. Call your doctor or nurse as soon as possible if you have any of these symptoms:  Pain in fingers, toes,  joints, or testicles  Numbness, tingling, or decreased feeling or weakness in fingers, toes, arms, or legs  Trouble walking or changes in the way you walk  Swelling of your legs, ankles, and/or feet  Headache  Pain in your mouth or throat that makes it hard to eat or drink  Loss of appetite or weight loss  Hearing changes  Depression  Trouble sleeping  Reproduction Concerns  Women may go through signs of menopause (change of life) like vaginal dryness or itching. Vaginal lubricants can be used to lessen vaginal dryness, itching, and pain during sexual relations.  Pregnancy warning: This drug may have harmful effects on the unborn child, so effective methods of birth control should be used during your cancer treatment.  Breast feeding warning: It is not known if this drug passes into breast milk. For this reason, women should talk to their doctor about the risks and benefits of breast feeding during treatment with this drug because this drug may enter the breast milk and badly harm a breast feeding baby.    EDUCATIONAL MATERIALS GIVEN AND REVIEWED: Chemotherapy and you book given, nutrition book,  Information on [facility].  SELF CARE ACTIVITIES WHILE ON CHEMOTHERAPY: Hydration Increase your fluid intake 48 hours prior to treatment and drink at least 8 to 12 cups (64 ounces) of water/decaff beverages per day after treatment. You can still have your cup of coffee or soda but these beverages do not count as part of your 8 to 12 cups that you need to drink daily. No alcohol intake.  Medications Continue taking your normal prescription medication as prescribed.  If you start any new herbal or new supplements please let us know first to make sure it is safe.  Mouth Care Have teeth cleaned professionally before starting treatment. Keep dentures and partial plates clean. Use soft toothbrush and do not use mouthwashes that contain alcohol. Biotene is a good mouthwash that is available  at most pharmacies or may be ordered by calling [phone]. Use warm salt water gargles (1 teaspoon salt per 1 quart warm water) before and after meals and at bedtime. Or you may rinse with 2 tablespoons of three-percent hydrogen peroxide mixed in eight ounces of water. If you are still having problems with your mouth or sores in your mouth please call the clinic. If you need dental work, please let Dr. Whitney Muse know before you go for your appointment so that we can coordinate the best possible time for you in regards to your chemo regimen. You need to also let your dentist know that you are actively taking chemo. We may need to do labs prior to your dental appointment.   Skin Care Always use sunscreen  that has not expired and with SPF (Sun Protection Factor) of 50 or higher. Wear hats to protect your head from the sun. Remember to use sunscreen on your hands, ears, face, & feet.  Use good moisturizing lotions such as udder cream, eucerin, or even Vaseline. Some chemotherapies can cause dry skin, color changes in your skin and nails.     Avoid long, hot showers or baths.  Use gentle, fragrance-free soaps and laundry detergent.  Use moisturizers, preferably creams or ointments rather than lotions because the thicker consistency is better at preventing skin dehydration. Apply the cream or ointment within 15 minutes of showering. Reapply moisturizer at night, and moisturize your hands every time after you wash them.  Hair Loss (if your doctor says your hair will fall out)   If your doctor says that your hair is likely to fall out, decide before you begin chemo whether you want to wear a wig. You may want to shop before treatment to match your hair color.  Hats, turbans, and scarves can also camouflage hair loss, although some people prefer to leave their heads uncovered. If you go bare-headed outdoors, be sure to use sunscreen on your scalp.  Cut your hair short. It eases the inconvenience of  shedding lots of hair, but it also can reduce the emotional impact of watching your hair fall out.  Dont perm or color your hair during chemotherapy. Those chemical treatments are already damaging to hair and can enhance hair loss. Once your chemo treatments are done and your hair has grown back, its OK to resume dyeing or perming hair. With chemotherapy, hair loss is almost always temporary. But when it grows back, it may be a different color or texture. In older adults who still had hair color before chemotherapy, the new growth may be completely gray.  Often, new hair is very fine and soft.  Infection Prevention Please wash your hands for at least 30 seconds using warm soapy water. Handwashing is the #1 way to prevent the spread of germs. Stay away from sick people or people who are getting over a cold. If you develop respiratory systems such as green/yellow mucus production or productive cough or persistent cough let us know and we will see if you need an antibiotic. It is a good idea to keep a pair of gloves on when going into grocery stores/[employer] to decrease your risk of coming into contact with germs on the carts, etc. Carry alcohol hand gel with you at all times and use it frequently if out in public. If your temperature reaches 100.5 or higher please call the clinic and let us know.  If it is after hours or on the weekend please go to the ER if your temperature is over 100.5.  Please have your own personal thermometer at home to use.    Sex and bodily fluids If you are going to have sex, a condom must be used to protect the person that isnt taking chemotherapy. Chemo can decrease your libido (sex drive). For a few days after chemotherapy, chemotherapy can be excreted through your bodily fluids.  When using the toilet please close the lid and flush the toilet twice.  Do this for a few day after you have had chemotherapy.     Effects of chemotherapy on your sex life Some changes are simple  and wont last long. They won't affect your sex life permanently. Sometimes you may feel:  too tired  not strong enough to be very  active  sick or sore   not in the mood  anxious or low Your anxiety might not seem related to sex. For example, you may be worried about the cancer and how your treatment is going. Or you may be worried about money, or about how you family are coping with your illness. These things can cause stress, which can affect your interest in sex. Its important to talk to your partner about how you feel. Remember - the changes to your sex life don't usually last long. There's usually no medical reason to stop having sex during chemo. The drugs won't have any long term physical effects on your performance or enjoyment of sex. Cancer can't be passed on to your partner during sex  Contraception Its important to use reliable contraception during treatment. Avoid getting pregnant while you or your partner are having chemotherapy. This is because the drugs may harm the baby. Sometimes chemotherapy drugs can leave a man or woman infertile.  This means you would not be able to have children in the future. You might want to talk to someone about permanent infertility. It can be very difficult to learn that you may no longer be able to have children. Some people find counselling helpful. There might be ways to preserve your fertility, although this is easier for men than for women. You may want to speak to a fertility expert. You can talk about sperm banking or harvesting your eggs. You can also ask about other fertility options, such as donor eggs. If you have or have had breast cancer, your doctor might advise you not to take the contraceptive pill. This is because the hormones in it might affect the cancer.  It is not known for sure whether or not chemotherapy drugs can be passed on through semen or secretions from the vagina. Because of this some doctors advise people to use a  barrier method if you have sex during treatment. This applies to vaginal, anal or oral sex. Generally, doctors advise a barrier method only for the time you are actually having the treatment and for about a week after your treatment. Advice like this can be worrying, but this does not mean that you have to avoid being intimate with your partner. You can still have close contact with your partner and continue to enjoy sex.  Animals If you have cats or birds we just ask that you not change the litter or change the cage.  Please have someone else do this for you while you are on chemotherapy.   Food Safety During and After Cancer Treatment Food safety is important for people both during and after cancer treatment. Cancer and cancer treatments, such as chemotherapy, radiation therapy, and stem cell/bone marrow transplantation, often weaken the immune system. This makes it harder for your body to protect itself from foodborne illness, also called food poisoning. Foodborne illness is caused by eating food that contains harmful bacteria, parasites, or viruses.  Foods to avoid Some foods have a higher risk of becoming tainted with bacteria. These include:  Unwashed fresh fruit and vegetables, especially leafy vegetables that can hide dirt and other contaminants  Raw sprouts, such as alfalfa sprouts  Raw or undercooked beef, especially ground beef, or other raw or undercooked meat and poultry  Fatty, fried, or spicy foods immediately before or after treatment.  These can sit heavy on your stomach and make you feel nauseous.  Raw or undercooked shellfish, such as oysters.  Sushi and sashimi, which often  contain raw fish.   Unpasteurized beverages, such as unpasteurized fruit juices, raw milk, raw yogurt, or cider  Undercooked eggs, such as soft boiled, over easy, and poached; raw, unpasteurized eggs; or foods made with raw egg, such as homemade raw cookie dough and homemade mayonnaise Simple steps  for food safety [employer].  Do not buy food stored or displayed in an unclean area.  Do not buy bruised or damaged fruits or vegetables.  Do not buy cans that have cracks, dents, or bulges.  Pick up foods that can spoil at the end of your shopping trip and store them in a cooler on the way home. Prepare and clean up foods carefully.  Rinse all fresh fruits and vegetables under running water, and dry them with a clean towel or paper towel.  Clean the top of cans before opening them.  After preparing food, wash your hands for 20 seconds with hot water and soap. Pay special attention to areas between fingers and under nails.  Clean your utensils and dishes with hot water and soap.  Disinfect your kitchen and cutting boards using 1 teaspoon of liquid, unscented bleach mixed into 1 quart of water.   Dispose of old food.  Eat canned and packaged food before its expiration date (the use by or best before date).  Consume refrigerated leftovers within 3 to 4 days. After that time, throw out the food. Even if the food does not smell or look spoiled, it still may be unsafe. Some bacteria, such as Listeria, can grow even on foods stored in the refrigerator if they are kept for too long. Take precautions when eating out.  At restaurants, avoid buffets and salad bars where food sits out for a long time and comes in contact with many people. Food can become contaminated when someone with a virus, often a norovirus, or another bug handles it.  Put any leftover food in a to-go container yourself, rather than having the server do it. And, refrigerate leftovers as soon as you get home.  Choose restaurants that are clean and that are willing to prepare your food as you order it cooked.    MEDICATIONS: Zofran/Ondansetron 8mg  tablet. Take 1 tablet every 8 hours as needed for nausea/vomiting. (#1 nausea med to take, this can constipate)  Compazine/Prochlorperazine 10mg  tablet. Take 1 tablet  every 6 hours as needed for nausea/vomiting. (#2 nausea med to take, this can make you sleepy)   EMLA cream. Apply a quarter size amount to port site 1 hour prior to chemo. Do not rub in. Cover with plastic wrap.   Over-the-Counter Meds:  Miralax 1 capful in 8 oz of fluid daily. May increase to two times a day if needed. This is a stool softener. If this doesn't work proceed you can add:  Senokot S-start with 1 tablet two times a day and increase to 4 tablets two times a day if needed. (total of 8 tablets in a 24 hour period). This is a stimulant laxative.   Call us if this does not help your bowels move.   Imodium 2mg  capsule. Take 2 capsules after the 1st loose stool and then 1 capsule every 2 hours until you go a total of 12 hours without having a loose stool. Call the [facility] if loose stools continue. If diarrhea occurs @ bedtime, take 2 capsules @ bedtime. Then take 2 capsules every 4 hours until morning. Call [facility].    Constipation Sheet *Miralax in 8 oz of fluid  daily.  May increase to two times a day if needed.  This is a stool softener.  If this not enough to keep your bowel regular:  You can add:  *Senokot S, start with one tablet twice a day and can increase to 4 tablets twice a day if needed.  This is a stimulant laxative.   Sometimes when you take pain medication you need BOTH a medicine to keep your stool soft and a medicine to help your bowel push it out!  Please call if the above does not work for you.   Do not go more than 2 days without a bowel movement.  It is very important that you do not become constipated.  It will make you feel sick to your stomach (nausea) and can cause abdominal pain and vomiting.     Diarrhea Sheet   If you are having loose stools/diarrhea, please purchase Imodium and begin taking as outlined:  At the first sign of poorly formed or loose stools you should begin taking Imodium(loperamide) 2 mg capsules.  Take two caplets  (4mg ) followed by one caplet (2mg ) every 2 hours until you have had no diarrhea for 12 hours.  During the night take two caplets (4mg ) at bedtime and continue every 4 hours during the night until the morning.  Stop taking Imodium only after there is no sign of diarrhea for 12 hours.    Always call the [facility] if you are having loose stools/diarrhea that you can't get under control.  Loose stools/disrrhea leads to dehydration (loss of water) in your body.  We have other options of trying to get the loose stools/diarrhea to stopped but you must let us know!    Nausea Sheet  Zofran/Ondansetron 8mg  tablet. Take 1 tablet every 8 hours as needed for nausea/vomiting. (#1 nausea med to take, this can constipate)  Compazine/Prochlorperazine 10mg  tablet. Take 1 tablet every 6 hours as needed for nausea/vomiting. (#2 nausea med to take, this can make you sleepy)  You can take these medications together or separately.  We would first like for you to try the Ondansetron by itself and then take the Prochloperizine if needed. But you are allowed to take both medications at the same time if your nausea is that severe.  If you are having persistent nausea (nausea that does not stop) please take these medications on a staggered schedule so that the nausea medication stays in your body.  Please call the [facility] and let us know the amount of nausea that you are experiencing.  If you begin to vomit, you need to call the [facility] and if it is the weekend and you have vomited more than one time and cant get it to stop-go to the Emergency Room.  Persistent nausea/vomiting can lead to dehydration (loss of fluid in your body) and will make you feel terrible.   Ice chips, sips of clear liquids, foods that are @ room temperature, crackers, and toast tend to be better tolerated.    SYMPTOMS TO REPORT AS SOON AS POSSIBLE AFTER TREATMENT:  FEVER GREATER THAN 100.5 F  CHILLS WITH OR WITHOUT FEVER  NAUSEA AND  VOMITING THAT IS NOT CONTROLLED WITH YOUR NAUSEA MEDICATION  UNUSUAL SHORTNESS OF BREATH  UNUSUAL BRUISING OR BLEEDING  TENDERNESS IN MOUTH AND THROAT WITH OR WITHOUT PRESENCE OF ULCERS  URINARY PROBLEMS  BOWEL PROBLEMS  UNUSUAL RASH    Wear comfortable clothing and clothing appropriate for easy access to any Portacath or PICC line.  Let us know if there is anything that we can do to make your therapy better!     What to do if you need assistance after hours or on the weekends: CALL [phone].  HOLD on the line, do not hang up.  You will hear multiple messages but at the end you will be connected with a nurse triage line.  They will contact Dr Whitney Muse if necessary.  Most of the time they will be able to assist you.   Do not call the hospital operator.  Dr Whitney Muse will not answer phone calls received by them.      I have been informed and understand all of the instructions given to me and have received a copy. I have been instructed to call the clinic [phone] or my family physician as soon as possible for continued medical care, if indicated. I do not have any more questions at this time but understand that I may call the [facility] or the Patient Navigator at [phone] during office hours should I have questions or need assistance in obtaining follow-up care.

## 2016-07-24 NOTE — H&P (Signed)
Chief Complaint: large B cell lymphoma  Referring Physician:Dr. Ancil Linsey  Supervising Physician: Arne Cleveland  Patient Status: [facility] - Out-pt  HPI: Gabriella Love is an 60 y.o. 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  She underwent a lymph node resection which confirmed the diagnosis.  She had a PAC placed about a week ago as well.  A request has been made for a bone marrow biopsy to get a baseline evaluation of her marrow.  She presents today for this procedure.  She has no other complaints except soreness still around her port site.  Past Medical History:  Past Medical History:  Diagnosis Date  . Anxiety   . Arthritis   . DLBCL (diffuse large B cell lymphoma) ([place]) 07/16/2016  . GERD (gastroesophageal reflux disease)   . Heart disease   . Hyperlipidemia   . Hypertension   . Myocardial infarction 12/19/2009   released from cardiology    Past Surgical History:  Past Surgical History:  Procedure Laterality Date  . CARDIAC CATHETERIZATION     cardiac stent  . CHOLECYSTECTOMY    . CORONARY STENT PLACEMENT  2011  . INGUINAL LYMPH NODE BIOPSY Left 07/13/2016   Procedure: EXCISIONAL BIOPSY OF LEFT INGUINAL LYMPH NODE;  Surgeon: Vickie Epley, MD;  Location: [facility]S;  Service: General;  Laterality: Left;  . PORTACATH PLACEMENT N/A 07/17/2016   Procedure: INSERTION OF TUNNELED CENTRAL VENOUS CATHETER WITH SUBCUTANEOUS PORT;  Surgeon: Vickie Epley, MD;  Location: [facility];  Service: Vascular;  Laterality: N/A;  . TUBAL LIGATION      Family History:  Family History  Problem Relation Age of Onset  . Cancer Mother     colon  . Depression Mother   . Heart disease Father   . Depression Sister   . Cancer Sister     leukemia  . Hyperlipidemia Brother   . Hypertension Brother     Social History:  reports that she has been smoking Cigarettes.  She has a 20.00 pack-year smoking history. She has never used smokeless tobacco. She reports  that she does not drink alcohol or use drugs.  Allergies:  Allergies  Allergen Reactions  . Amoxicillin Nausea And Vomiting    Yeast Infections  Has patient had a PCN reaction causing immediate rash, facial/tongue/throat swelling, SOB or lightheadedness with hypotension: No Has patient had a PCN reaction causing severe rash involving mucus membranes or skin necrosis: No Has patient had a PCN reaction that required hospitalization No Has patient had a PCN reaction occurring within the last 10 years: No If all of the above answers are "NO", then may proceed with Cephalosporin use.   Marland Kitchen Choline Fenofibrate     REACTION: Abd pain and elevated Lipase  . Oxycodone-Acetaminophen Nausea And Vomiting  . Penicillins     Yeast infections, thrush       Medications: Medications reviewed in epic  Please HPI for pertinent positives, otherwise complete 10 system ROS negative.  Mallampati Score: MD Evaluation Airway: WNL Heart: WNL Abdomen: WNL Chest/ Lungs: Other (comments) Chest/ lungs comments: PAC in right upper chest ASA  Classification: 2 Mallampati/Airway Score: Two  Physical Exam: BP 134/80 (BP Location: Right Arm)   Pulse 79   Temp 97.6 F (36.4 C) (Oral)   Resp 16   Ht 5' 2" (1.575 m)   Wt 125 lb 1.6 oz (56.7 kg)   SpO2 96%   BMI 22.88 kg/m  Body mass index is  22.88 kg/m. General: pleasant, WD, WN white female who is laying in bed in NAD HEENT: head is normocephalic, atraumatic.  Sclera are noninjected.  PERRL.  Ears and nose without any masses or lesions.  Mouth is pink and moist Heart: regular, rate, and rhythm.  Normal s1,s2. No obvious murmurs, gallops, or rubs noted.  Palpable radial and pedal pulses bilaterally Lungs/chest: CTAB, no wheezes, rhonchi, or rales noted.  Respiratory effort nonlabored.  Right upper chest port site is c/d/i and healing well Abd: soft, NT, ND, +BS, no masses, hernias, or organomegaly Psych: A&Ox3 with an appropriate  affect.   Labs: Results for orders placed or performed during the hospital encounter of 07/24/16 (from the past 48 hour(s))  APTT upon arrival     Status: None   Collection Time: 07/24/16  7:17 AM  Result Value Ref Range   aPTT 32 24 - 36 seconds  CBC upon arrival     Status: Abnormal   Collection Time: 07/24/16  7:17 AM  Result Value Ref Range   WBC 9.7 4.0 - 10.5 K/uL   RBC 4.83 3.87 - 5.11 MIL/uL   Hemoglobin 15.2 (H) 12.0 - 15.0 g/dL   HCT 44.5 36.0 - 46.0 %   MCV 92.1 78.0 - 100.0 fL   MCH 31.5 26.0 - 34.0 pg   MCHC 34.2 30.0 - 36.0 g/dL   RDW 12.9 11.5 - 15.5 %   Platelets 247 150 - 400 K/uL  Protime-INR upon arrival     Status: None   Collection Time: 07/24/16  7:17 AM  Result Value Ref Range   Prothrombin Time 12.8 11.4 - 15.2 seconds   INR 0.97     Imaging: No results found.  Assessment/Plan 1. Diffuse large B cell lymphoma -we will plan to proceed today with a bone marrow biopsy -labs and vitals have been reviewed -Risks and Benefits discussed with the patient including, but not limited to bleeding, infection, damage to adjacent structures or low yield requiring additional tests. All of the patient's questions were answered, patient is agreeable to proceed. Consent signed and in chart.   Thank you for this interesting consult.  I greatly enjoyed meeting [name] and look forward to participating in their care.  A copy of this report was sent to the requesting provider on this date.  Electronically Signed: Henreitta Cea 07/24/2016, 8:37 AM   I spent a total of  30 Minutes   in face to face in clinical consultation, greater than 50% of which was counseling/coordinating care for diffuse large b cell lymphoma

## 2016-07-28 ENCOUNTER — Ambulatory Visit (HOSPITAL_COMMUNITY)
Admission: RE | Admit: 2016-07-28 | Discharge: 2016-07-28 | Disposition: A | Discharge status: Home / Self Care | Payer: Commercial Managed Care - HMO | Source: Ambulatory Visit | Attending: Oncology | Admitting: Oncology

## 2016-07-28 DIAGNOSIS — J984 Other disorders of lung: Secondary | ICD-10-CM | POA: Insufficient documentation

## 2016-07-28 DIAGNOSIS — C8335 Diffuse large B-cell lymphoma, lymph nodes of inguinal region and lower limb: Secondary | ICD-10-CM

## 2016-07-28 DIAGNOSIS — I700 Atherosclerosis of aorta: Secondary | ICD-10-CM | POA: Insufficient documentation

## 2016-07-28 DIAGNOSIS — I2510 Atherosclerotic heart disease of native coronary artery without angina pectoris: Secondary | ICD-10-CM | POA: Insufficient documentation

## 2016-07-28 DIAGNOSIS — J439 Emphysema, unspecified: Secondary | ICD-10-CM | POA: Insufficient documentation

## 2016-07-28 LAB — GLUCOSE, CAPILLARY: Glucose-Capillary: 113 mg/dL — ABNORMAL HIGH (ref 65–99)

## 2016-07-28 MED ORDER — FLUDEOXYGLUCOSE F - 18 (FDG) INJECTION
6.2000 | Freq: Once | INTRAVENOUS | Status: AC | PRN
  Administered 2016-07-28: 6.2 via INTRAVENOUS

## 2016-07-29 ENCOUNTER — Encounter (HOSPITAL_COMMUNITY): Payer: Self-pay | Attending: Hematology & Oncology

## 2016-07-29 ENCOUNTER — Other Ambulatory Visit (HOSPITAL_COMMUNITY): Payer: Self-pay | Admitting: Hematology & Oncology

## 2016-07-29 ENCOUNTER — Encounter (HOSPITAL_COMMUNITY): Payer: Self-pay | Admitting: Emergency Medicine

## 2016-07-29 ENCOUNTER — Other Ambulatory Visit (HOSPITAL_COMMUNITY): Payer: Self-pay | Admitting: Emergency Medicine

## 2016-07-29 DIAGNOSIS — Z79899 Other long term (current) drug therapy: Secondary | ICD-10-CM | POA: Insufficient documentation

## 2016-07-29 DIAGNOSIS — C8330 Diffuse large B-cell lymphoma, unspecified site: Secondary | ICD-10-CM

## 2016-07-29 DIAGNOSIS — Z880 Allergy status to penicillin: Secondary | ICD-10-CM | POA: Insufficient documentation

## 2016-07-29 DIAGNOSIS — I252 Old myocardial infarction: Secondary | ICD-10-CM | POA: Insufficient documentation

## 2016-07-29 DIAGNOSIS — F17210 Nicotine dependence, cigarettes, uncomplicated: Secondary | ICD-10-CM | POA: Insufficient documentation

## 2016-07-29 DIAGNOSIS — C8335 Diffuse large B-cell lymphoma, lymph nodes of inguinal region and lower limb: Secondary | ICD-10-CM | POA: Insufficient documentation

## 2016-07-29 DIAGNOSIS — Z7982 Long term (current) use of aspirin: Secondary | ICD-10-CM | POA: Insufficient documentation

## 2016-07-29 MED ORDER — ONDANSETRON HCL 8 MG PO TABS: 8.0000 mg | ORAL_TABLET | Freq: Three times a day (TID) | ORAL | 2 refills | Status: DC | PRN

## 2016-07-29 MED ORDER — ONDANSETRON HCL 8 MG PO TABS
8.0000 mg | ORAL_TABLET | Freq: Three times a day (TID) | ORAL | 2 refills | Status: DC | PRN
Start: 2016-07-29 — End: 2016-07-29

## 2016-07-29 MED ORDER — PROCHLORPERAZINE MALEATE 10 MG PO TABS: 10.0000 mg | ORAL_TABLET | Freq: Four times a day (QID) | ORAL | 2 refills | Status: DC | PRN

## 2016-07-29 MED ORDER — LIDOCAINE-PRILOCAINE 2.5-2.5 % EX CREA: TOPICAL_CREAM | CUTANEOUS | 2 refills | Status: DC

## 2016-07-29 NOTE — Progress Notes (Signed)
START ON PATHWAY REGIMEN - Lymphoma and CLL  [id number]: R-CHOP q21 Days x 6 Cycles   A cycle is every 21 days:     Rituximab (Rituxan(R)) 375 mg/m2 in _____ mL NS IV day 1 only. Dose Mod: None     Cyclophosphamide (Cytoxan(R)) 750 mg/m2 in 250 mL NS IV over 30 minutes day 1 only Dose Mod: None     Doxorubicin (Adriamycin(R)) 50 mg/m2 IV push day 1 only Dose Mod: None     Vincristine (Oncovin(R)) 1.4 mg/m2; MAX 2 mg in 50 mL normal saline IV over 20 minutes on day 1 only. (MAX DOSE 2 mg) Dose Mod: None     Prednisone 100 mg flat dose orally daily days 1,2,3,4,5. Dose Mod: None Additional Orders: Hepatitis B&C testing recommended prior to rituximab use on all patients. Final concentration of rituximab must be between 1 and 4 mg/mL.  **Always confirm dose/schedule in your pharmacy ordering system**    Patient Characteristics: Diffuse Large B Cell, First Line, Stage III and IV Disease Type: Not Applicable Disease Type: Diffuse Large B Cell Line of therapy: First Line [place] Stage: IIIA  Intent of Therapy: Curative Intent, Discussed with Patient

## 2016-07-29 NOTE — Progress Notes (Signed)
Pt had flow cytometry completed today after teaching.  Pt was able to pick up pre-meds from [facility] today.  Pt left Ashely a message and hopefully she can meet her while she is here for chemotherapy tomorrow.    Results given about PET scan.

## 2016-07-30 ENCOUNTER — Other Ambulatory Visit (HOSPITAL_COMMUNITY): Payer: Self-pay

## 2016-07-30 ENCOUNTER — Ambulatory Visit (HOSPITAL_COMMUNITY): Payer: Self-pay | Admitting: Hematology & Oncology

## 2016-07-30 ENCOUNTER — Encounter (HOSPITAL_COMMUNITY): Payer: Commercial Managed Care - HMO | Attending: Hematology & Oncology | Admitting: Hematology & Oncology

## 2016-07-30 ENCOUNTER — Encounter (HOSPITAL_BASED_OUTPATIENT_CLINIC_OR_DEPARTMENT_OTHER): Payer: Commercial Managed Care - HMO

## 2016-07-30 ENCOUNTER — Encounter (HOSPITAL_COMMUNITY): Payer: Self-pay | Admitting: Hematology & Oncology

## 2016-07-30 VITALS — BP 125/65 | HR 100 | Temp 98.5°F | Resp 18 | Wt 124.7 lb

## 2016-07-30 DIAGNOSIS — C8338 Diffuse large B-cell lymphoma, lymph nodes of multiple sites: Secondary | ICD-10-CM

## 2016-07-30 DIAGNOSIS — C8335 Diffuse large B-cell lymphoma, lymph nodes of inguinal region and lower limb: Secondary | ICD-10-CM

## 2016-07-30 DIAGNOSIS — Z5111 Encounter for antineoplastic chemotherapy: Secondary | ICD-10-CM | POA: Diagnosis not present

## 2016-07-30 DIAGNOSIS — Z5189 Encounter for other specified aftercare: Secondary | ICD-10-CM

## 2016-07-30 DIAGNOSIS — I252 Old myocardial infarction: Secondary | ICD-10-CM

## 2016-07-30 DIAGNOSIS — F17200 Nicotine dependence, unspecified, uncomplicated: Secondary | ICD-10-CM

## 2016-07-30 DIAGNOSIS — C8330 Diffuse large B-cell lymphoma, unspecified site: Secondary | ICD-10-CM

## 2016-07-30 MED ORDER — DEXAMETHASONE SODIUM PHOSPHATE 10 MG/ML IJ SOLN
10.0000 mg | Freq: Once | INTRAMUSCULAR | Status: AC
  Administered 2016-07-30: 10 mg via INTRAVENOUS

## 2016-07-30 MED ORDER — SODIUM CHLORIDE 0.9% FLUSH: 10.0000 mL | INTRAVENOUS | Status: DC | PRN

## 2016-07-30 MED ORDER — DIPHENHYDRAMINE HCL 25 MG PO CAPS
50.0000 mg | ORAL_CAPSULE | Freq: Once | ORAL | Status: AC
  Administered 2016-07-30: 50 mg via ORAL

## 2016-07-30 MED ORDER — DEXAMETHASONE SODIUM PHOSPHATE 10 MG/ML IJ SOLN
INTRAMUSCULAR | Status: AC
  Filled 2016-07-30: qty 1

## 2016-07-30 MED ORDER — HEPARIN SOD (PORK) LOCK FLUSH 100 UNIT/ML IV SOLN
500.0000 [IU] | Freq: Once | INTRAVENOUS | Status: AC | PRN
  Administered 2016-07-30: 500 [IU]
  Filled 2016-07-30: qty 5

## 2016-07-30 MED ORDER — PEGFILGRASTIM 6 MG/0.6ML ~~LOC~~ PSKT
6.0000 mg | PREFILLED_SYRINGE | Freq: Once | SUBCUTANEOUS | Status: AC
  Administered 2016-07-30: 6 mg via SUBCUTANEOUS
  Filled 2016-07-30: qty 0.6

## 2016-07-30 MED ORDER — DIPHENHYDRAMINE HCL 25 MG PO CAPS
ORAL_CAPSULE | ORAL | Status: AC
  Filled 2016-07-30: qty 2

## 2016-07-30 MED ORDER — DOXORUBICIN HCL CHEMO IV INJECTION 2 MG/ML
50.0000 mg/m2 | Freq: Once | INTRAVENOUS | Status: AC
  Administered 2016-07-30: 78 mg via INTRAVENOUS
  Filled 2016-07-30: qty 39

## 2016-07-30 MED ORDER — ALLOPURINOL 300 MG PO TABS
300.0000 mg | ORAL_TABLET | Freq: Once | ORAL | Status: AC
  Administered 2016-07-30: 300 mg via ORAL
  Filled 2016-07-30: qty 1

## 2016-07-30 MED ORDER — ACETAMINOPHEN 325 MG PO TABS
650.0000 mg | ORAL_TABLET | Freq: Once | ORAL | Status: AC
  Administered 2016-07-30: 650 mg via ORAL

## 2016-07-30 MED ORDER — ACETAMINOPHEN 325 MG PO TABS
ORAL_TABLET | ORAL | Status: AC
  Filled 2016-07-30: qty 2

## 2016-07-30 MED ORDER — DEXAMETHASONE SODIUM PHOSPHATE 100 MG/10ML IJ SOLN: 10.0000 mg | Freq: Once | INTRAMUSCULAR | Status: DC

## 2016-07-30 MED ORDER — SODIUM CHLORIDE 0.9 % IV SOLN
750.0000 mg/m2 | Freq: Once | INTRAVENOUS | Status: AC
  Administered 2016-07-30: 1180 mg via INTRAVENOUS
  Filled 2016-07-30: qty 9

## 2016-07-30 MED ORDER — LORAZEPAM 2 MG/ML IJ SOLN
0.5000 mg | Freq: Once | INTRAMUSCULAR | Status: AC
  Administered 2016-07-30: 0.5 mg via INTRAVENOUS

## 2016-07-30 MED ORDER — PALONOSETRON HCL INJECTION 0.25 MG/5ML
0.2500 mg | Freq: Once | INTRAVENOUS | Status: AC
  Administered 2016-07-30: 0.25 mg via INTRAVENOUS

## 2016-07-30 MED ORDER — LORAZEPAM 2 MG/ML IJ SOLN
INTRAMUSCULAR | Status: AC
  Filled 2016-07-30: qty 1

## 2016-07-30 MED ORDER — VINCRISTINE SULFATE CHEMO INJECTION 1 MG/ML
2.0000 mg | Freq: Once | INTRAVENOUS | Status: AC
  Administered 2016-07-30: 2 mg via INTRAVENOUS
  Filled 2016-07-30: qty 2

## 2016-07-30 MED ORDER — PALONOSETRON HCL INJECTION 0.25 MG/5ML
INTRAVENOUS | Status: AC
  Filled 2016-07-30: qty 5

## 2016-07-30 MED ORDER — ALLOPURINOL 300 MG PO TABS: 300.0000 mg | ORAL_TABLET | Freq: Every day | ORAL | 3 refills | Status: DC

## 2016-07-30 MED ORDER — SODIUM CHLORIDE 0.9 % IV SOLN
375.0000 mg/m2 | Freq: Once | INTRAVENOUS | Status: AC
  Administered 2016-07-30: 600 mg via INTRAVENOUS
  Filled 2016-07-30: qty 10

## 2016-07-30 MED ORDER — SODIUM CHLORIDE 0.9 % IV SOLN
Freq: Once | INTRAVENOUS | Status: AC
  Administered 2016-07-30: 09:00:00 via INTRAVENOUS

## 2016-07-30 NOTE — Progress Notes (Signed)
 [facility]  Progress Note  Patient Care Team: John Z Hall, MD as PCP - General (Internal Medicine)  CHIEF COMPLAINTS/PURPOSE OF CONSULTATION:     DLBCL (diffuse large B cell lymphoma) (HCC)   07/13/2016 Initial Biopsy    L inguinal biopsy with Dr. 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 Grade (if applicable): High grade. Flow cytometry: [id number] reveals a monoclonal B-cell population with expression of CD10. Immunohistochemical stains: CD20, CD3, CD10, bcl-2, bcl-6, CD21, CD23, Ki-67. Touch preps/imprints: Mixture of large and smaller lymphocytes with irregular nuclear contours. Comments: Sections of lymph node reveal effacement of the architecture by a nodular proliferation of neoplastic appearing follicles. In many of the areas the follicles coalesce and form sheets of large cells. The more formed follicles predominately consist of numerous large centroblasts (>15 per hpf), representing a high grade (3B) follicular component. The diffuse areas are also composed of large cells but lack follicular dendritic networks (CD21, CD23). Immunohistochemistry reveals the atypical lymphocytes are positive for CD20, CD10, and bcl-6. They are negative for bcl-2. Ki-67 is elevated (30% overall). CD3 reveals residual surrounding T-cells. Flow cytometry ([id number]) reveals a monoclonal B-cell population with expression of CD10. 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 The case was called to Dr. Davis on 07/16/2016.      07/23/2016 Echocardiogram    Left ventricle: Systolic function was normal. The estimated ejection fraction was in the range of 50% to 55%. Doppler parameters are consistent  with abnormal left ventricular relaxation (grade 1 diastolic dysfunction).      07/24/2016 Procedure    Technically successful CT guided right iliac bone core and aspiration biopsy by IR.      07/28/2016 PET scan    1. Hypermetabolic lymphadenopathy in the neck, abdomen and right inguinal area as described above, consistent with known lymphoma. 2. Focal area of hypermetabolism in the appendix could be lymphomatous involvement. Recommend attention on follow-up scans. 3. Age advanced atherosclerotic calcifications involving the abdominal aorta and branch vessels and age advanced three-vessel coronary artery calcifications. 4. No obvious osseous involvement. 5. Emphysematous changes and pulmonary scarring.       HISTORY OF PRESENTING ILLNESS:  Gabriella Love 60 y.o. female is here for ongoing follow-up of Diffuse large B-cell lymphoma, Stage IIIA disease. IPI score of 1 with low risk disease.   Patient is here with her husband for first cycle of R-CHOP. Patient presents in treatment bed.  I personally reviewed and went over imaging with the patient. We reviewed her staging and BMBX.   She admits she is "scared to death" about her first chemotherapy treatment. She plans to have her hair cut in preparation of having her hair falling out. Stated she was used to short hair.   She has no other questions. She feels her chemotherapy teaching was thorough. Currently no pain. No nausea no headaches.  MEDICAL HISTORY:  Past Medical History:  Diagnosis Date  . Anxiety   . Arthritis   . DLBCL (diffuse large B cell lymphoma) (HCC) 07/16/2016  . GERD (gastroesophageal reflux disease)   . Heart disease   . Hyperlipidemia   . Hypertension   . Myocardial infarction 12/19/2009   released from cardiology    SURGICAL HISTORY: Past Surgical   History:  Procedure Laterality Date  . CARDIAC CATHETERIZATION     cardiac stent  . CHOLECYSTECTOMY    . CORONARY STENT PLACEMENT  09/06/2009  .  INGUINAL LYMPH NODE BIOPSY Left 07/13/2016   Procedure: EXCISIONAL BIOPSY OF LEFT INGUINAL LYMPH NODE;  Surgeon: Vickie Epley, MD;  Location: [facility];  Service: General;  Laterality: Left;  . PORTACATH PLACEMENT N/A 07/17/2016   Procedure: INSERTION OF TUNNELED CENTRAL VENOUS CATHETER WITH SUBCUTANEOUS PORT;  Surgeon: Vickie Epley, MD;  Location: [facility];  Service: Vascular;  Laterality: N/A;  . TUBAL LIGATION      SOCIAL HISTORY: Social History   Social History  . Marital status: Single    Spouse name: N/A  . Number of children: N/A  . Years of education: N/A   Occupational History  . Not on file.   Social History Main Topics  . Smoking status: Current Every Day Smoker    Packs/day: 0.50    Years: 40.00    Types: Cigarettes  . Smokeless tobacco: Never Used  . Alcohol use No  . Drug use: No  . Sexual activity: Yes    Birth control/ protection: Post-menopausal   Other Topics Concern  . Not on file   Social History Narrative  . No narrative on file   Been with her boyfriend since Sep 06, 2000 1 daughter 3 grandchildren Smoker, 1 to 1.5 ppd. Recently cut down. Started at 61 y.o. ETOH, rare She enjoys reading She was laid off on December 1st   FAMILY HISTORY: Family History  Problem Relation Age of Onset  . Cancer Mother     colon  . Depression Mother   . Heart disease Father   . Depression Sister   . Cancer Sister     leukemia  . Hyperlipidemia Brother   . Hypertension Brother    Mother deceased in early 63s. Diagnosed in 09/07/95 with colon cancer and passed in 06-Sep-1997 Father deceased at 49 years old of heart attack. Sister with leukemia in regression who passed away of congestive heart failure at 60 years-old. She was estranged and they only got back together about 6 months before she passed away. Brother with high blood pressure and high cholesterol. This sibling is also estranged. He smokes and drinks heavily.  ALLERGIES:  is allergic to amoxicillin; choline  fenofibrate; oxycodone-acetaminophen; and penicillins.  MEDICATIONS:  Current Outpatient Prescriptions  Medication Sig Dispense Refill  . acetaminophen (TYLENOL) 650 MG CR tablet Take 650 mg by mouth every 8 (eight) hours as needed for pain.    Marland Kitchen ALPRAZolam (XANAX) 0.5 MG tablet Take 0.5 mg by mouth daily as needed for anxiety.    Marland Kitchen aspirin 325 MG tablet Take 325 mg by mouth daily.      Marland Kitchen buPROPion (WELLBUTRIN XL) 150 MG 24 hr tablet Take 150 mg by mouth every evening.    . cyclophosphamide (CYTOXAN) 2 g chemo injection Inject into the vein once.    Marland Kitchen DOXOrubicin HCl (ADRIAMYCIN IV) Inject into the vein.    . fluticasone (FLONASE) 50 MCG/ACT nasal spray Place 1 spray into both nostrils daily as needed for allergies.     Marland Kitchen ketoconazole (NIZORAL) 2 % cream Apply 1 application topically daily. Apply to irritated skin when dry. 30 g 1  . lidocaine-prilocaine (EMLA) cream Apply a quarter size amount to affected area 1 hour prior to coming to chemotherapy. 30 g 2  . metoprolol succinate (TOPROL-XL) 25 MG 24 hr tablet Take 25 mg by mouth daily.      Marland Kitchen  nitroGLYCERIN (NITROSTAT) 0.4 MG SL tablet Place 0.4 mg under the tongue every 5 (five) minutes as needed for chest pain.     Marland Kitchen ondansetron (ZOFRAN) 8 MG tablet Take 1 tablet (8 mg total) by mouth every 8 (eight) hours as needed for nausea or vomiting. 30 tablet 2  . pantoprazole (PROTONIX) 40 MG tablet Take 40 mg by mouth every evening.     . Pegfilgrastim (NEULASTA ONPRO Cavalero) Inject into the skin.    Marland Kitchen prochlorperazine (COMPAZINE) 10 MG tablet Take 1 tablet (10 mg total) by mouth every 6 (six) hours as needed for nausea or vomiting. 30 tablet 2  . RiTUXimab (RITUXAN IV) Inject into the vein.    . simvastatin (ZOCOR) 40 MG tablet Take 40 mg by mouth at bedtime.      . vinCRIStine 2 mg in sodium chloride 0.9 % 50 mL Inject into the vein once.     No current facility-administered medications for this visit.    Facility-Administered Medications Ordered  in Other Visits  Medication Dose Route Frequency Provider Last Rate Last Dose  . 0.9 %  sodium chloride infusion   Intravenous Once Patrici Ranks, MD      . acetaminophen (TYLENOL) tablet 650 mg  650 mg Oral Once Patrici Ranks, MD      . cyclophosphamide (CYTOXAN) 1,180 mg in sodium chloride 0.9 % 250 mL chemo infusion  750 mg/m2 (Treatment Plan Recorded) Intravenous Once Patrici Ranks, MD      . dexamethasone (DECADRON) 10 mg in sodium chloride 0.9 % 50 mL IVPB  10 mg Intravenous Once Patrici Ranks, MD      . diphenhydrAMINE (BENADRYL) capsule 50 mg  50 mg Oral Once Patrici Ranks, MD      . DOXOrubicin (ADRIAMYCIN) chemo injection 78 mg  50 mg/m2 (Treatment Plan Recorded) Intravenous Once Patrici Ranks, MD      . heparin lock flush 100 unit/mL  500 Units Intracatheter Once PRN Patrici Ranks, MD      . palonosetron (ALOXI) injection 0.25 mg  0.25 mg Intravenous Once Patrici Ranks, MD      . riTUXimab (RITUXAN) 600 mg in sodium chloride 0.9 % 250 mL (1.9355 mg/mL) chemo infusion  375 mg/m2 (Treatment Plan Recorded) Intravenous Once Patrici Ranks, MD      . sodium chloride flush (NS) 0.9 % injection 10 mL  10 mL Intracatheter PRN Patrici Ranks, MD      . vinCRIStine (ONCOVIN) 2 mg in sodium chloride 0.9 % 50 mL chemo infusion  2 mg Intravenous Once Patrici Ranks, MD        Review of Systems  Constitutional: Negative.   HENT: Negative.   Eyes: Negative.   Respiratory: Negative.   Cardiovascular: Negative.   Gastrointestinal: Negative.   Genitourinary: Negative.   Musculoskeletal: Negative.   Skin: Negative.   Neurological: Negative.   Endo/Heme/Allergies: Negative.   Psychiatric/Behavioral: Negative.   All other systems reviewed and are negative. 14 point ROS was done and is otherwise as detailed above or in HPI     PHYSICAL EXAMINATION: ECOG PERFORMANCE STATUS: 1 - Symptomatic but completely ambulatory Vitals with BMI 07/30/2016  Height     Weight 124 lbs 11 oz  BMI   Systolic 389  Diastolic 77  Pulse 87  Respirations 16   Physical Exam  Constitutional: She is oriented to person, place, and time and well-developed, well-nourished, and in no distress.  In treatment bed  HENT:  Head: Normocephalic and atraumatic.  Mouth/Throat: Oropharynx is clear and moist. No oropharyngeal exudate.  Eyes: Conjunctivae and EOM are normal. Pupils are equal, round, and reactive to light. No scleral icterus.  Neck: Normal range of motion. Neck supple.  Cardiovascular: Normal rate, regular rhythm and normal heart sounds.   No murmur heard. Pulmonary/Chest: Effort normal and breath sounds normal. No respiratory distress. She has no wheezes.  Abdominal: Soft. Bowel sounds are normal. She exhibits no distension and no mass. There is no tenderness. There is no rebound and no guarding.  Musculoskeletal: Normal range of motion.  Lymphadenopathy:    She has no cervical adenopathy.  Neurological: She is alert and oriented to person, place, and time. No cranial nerve deficit. Gait normal.  Skin: Skin is warm and dry.  Psychiatric: Mood, memory, affect and judgment normal.  Nursing note and vitals reviewed.    LABORATORY DATA:  I have reviewed the data as listed Lab Results  Component Value Date   WBC 9.7 07/24/2016   HGB 15.2 (H) 07/24/2016   HCT 44.5 07/24/2016   MCV 92.1 07/24/2016   PLT 247 07/24/2016   CMP     Component Value Date/Time   NA 138 07/22/2016 1635   K 3.3 (L) 07/22/2016 1635   CL 103 07/22/2016 1635   CO2 25 07/22/2016 1635   GLUCOSE 105 (H) 07/22/2016 1635   BUN 14 07/22/2016 1635   CREATININE 0.82 07/22/2016 1635   CALCIUM 9.5 07/22/2016 1635   PROT 7.4 07/22/2016 1635   ALBUMIN 4.5 07/22/2016 1635   AST 17 07/22/2016 1635   ALT 14 07/22/2016 1635   ALKPHOS 90 07/22/2016 1635   BILITOT 0.5 07/22/2016 1635   GFRNONAA >60 07/22/2016 1635   GFRAA >60 07/22/2016 1635   Results for Tracz, Mairely L (MRN  [id number])   Ref. Range 07/22/2016 16:35  LDH Latest Ref Range: 98 - 192 U/L 156    PATHOLOGY      RADIOGRAPHIC STUDIES: I have personally reviewed the radiological images as listed and agreed with the findings in the report. Nm Pet Image Initial (pi) Skull Base To Thigh  Result Date: 07/28/2016 CLINICAL DATA:  Initial treatment strategy for diffuse large B-cell lymphoma. EXAM: NUCLEAR MEDICINE PET SKULL BASE TO THIGH TECHNIQUE: 6.2 mCi F-18 FDG was injected intravenously. Full-ring PET imaging was performed from the skull base to thigh after the radiotracer. CT data was obtained and used for attenuation correction and anatomic localization. FASTING BLOOD GLUCOSE:  Value: 113 mg/dl COMPARISON:  None FINDINGS: NECK Moderate hypermetabolic lymphoid tissue at the base of the tongue with SUV max of 4.9. There is also diffuse hypermetabolic lymphoid tissue in the region of the tonsils and Waldeyer's ring. SUV max is 6.3. Single hypermetabolic left-sided level II B measuring 7 mm on image number 21. CHEST No hypermetabolic mediastinal or hilar nodes. No suspicious pulmonary nodules on the CT scan. Moderate emphysematous changes and areas of compressive atelectasis. No acute findings or pleural effusion. Three-vessel coronary artery calcifications are noted. ABDOMEN/PELVIS 13.5 mm left-sided retroperitoneal lymph node on image number 113 is hypermetabolic with SUV max of 9.4. Left common iliac lymph node on image number 128 measures 13 mm and has an SUV max of 10.9. There is abnormal FDG activity noted in the distal aspect of the appendix. I do not see a discrete lesion but the appendix might be slightly thickened in this area. It is possible this is lymphomatous involvement. A small carcinoid tumor is also possibility.   There is no inflammation involving the appendix. Recommend attention on future/follow-up scans. Enlarged right inguinal lymph node measuring 20.5 x 20 mm on image 173 is markedly  hypermetabolic with SUV max of 78.2. Surgical changes in the left inguinal area from recent lymph node dissection. hypermetabolism is likely postsurgical. Age advanced atherosclerotic calcifications involving the aorta and branch vessels. 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 No splenomegaly or hepatic or splenic lesions are identified. SKELETON No focal hypermetabolic activity to suggest skeletal metastasis. IMPRESSION: 1. Hypermetabolic lymphadenopathy in the neck, abdomen and right inguinal area as described above, consistent with known lymphoma. 2. Focal area of hypermetabolism in the appendix could be lymphomatous involvement. Recommend attention on follow-up scans. 3. Age advanced atherosclerotic calcifications involving the abdominal aorta and branch vessels and age advanced three-vessel coronary artery calcifications. 4. No obvious osseous involvement. 5. Emphysematous changes and pulmonary scarring. Electronically Signed   By: Marijo Sanes M.D.   On: 07/28/2016 09:07       ASSESSMENT & PLAN:  Diffuse large B-cell lymphoma, Stage III disease, IPI score 1 No B symptoms Tobacco use History MI   Labs reviewed. Results are noted above. Normal LDH. BMBX results are WNL. This is reviewed with the patient results are noted above.  Pathology results reviewed. Results are noted above.   Staging reviewed with the patient and her husband.   PET reviewed. Results are noted above. She is here for first cycle R-CHOP.  I addressed the importance of smoking cessation with the patient in detail.  We discussed the health benefits of cessation.  We discussed the health detriments of ongoing tobacco use including but not limited to COPD, heart disease and malignancy. We reviewed the multiple options for cessation and I offered to refer her to smoking cessation classes. We discussed other alternatives to quit such as chantix,  wellbutrin. We will continue to address this moving forward.  She is scheduled for follow up with Kirby Crigler PA on 08/07/16. Proceed with cycle #1 today. ECHO reviewed.   Orders Placed This Encounter  Procedures  . CBC with Differential    Standing Status:   Future    Number of Occurrences:   1    Standing Expiration Date:   07/30/2017  . Comprehensive metabolic panel    Standing Status:   Future    Number of Occurrences:   1    Standing Expiration Date:   07/30/2017  . Uric acid    Standing Status:   Future    Number of Occurrences:   1    Standing Expiration Date:   07/30/2017  . Phosphorus    Standing Status:   Future    Number of Occurrences:   1    Standing Expiration Date:   07/30/2017    All questions were answered. The patient knows to call the clinic with any problems, questions or concerns.  This document serves as a record of services personally performed by Ancil Linsey, MD. It was created on her behalf by Shirlean Mylar, a trained medical scribe. The creation of this record is based on the scribe's personal observations and the provider's statements to them. This document has been checked and approved by the attending provider.   I have reviewed the above documentation for accuracy and completeness and I agree with the above.  This note was electronically signed.  Molli Hazard, MD  07/30/2016 8:26 AM

## 2016-07-30 NOTE — Patient Instructions (Signed)
[facility] Discharge Instructions for Patients Receiving Chemotherapy   Beginning January 23rd 2017 lab work for the [facility] will be done in the  Main lab at [facility] on 1st floor. If you have a lab appointment with the [facility] please come in thru the  Main Entrance and check in at the main information desk   Today you received the following chemotherapy agents:  Rituxan, Adriamycin, Vincristine, and Cytoxan  If you develop nausea and vomiting, or diarrhea that is not controlled by your medication, call the clinic.  The clinic phone number is [phone]. Office hours are Monday-Friday 8:30am-5:00pm.  BELOW ARE SYMPTOMS THAT SHOULD BE REPORTED IMMEDIATELY:  *FEVER GREATER THAN 101.0 F  *CHILLS WITH OR WITHOUT FEVER  NAUSEA AND VOMITING THAT IS NOT CONTROLLED WITH YOUR NAUSEA MEDICATION  *UNUSUAL SHORTNESS OF BREATH  *UNUSUAL BRUISING OR BLEEDING  TENDERNESS IN MOUTH AND THROAT WITH OR WITHOUT PRESENCE OF ULCERS  *URINARY PROBLEMS  *BOWEL PROBLEMS  UNUSUAL RASH Items with * indicate a potential emergency and should be followed up as soon as possible. If you have an emergency after office hours please contact your primary care physician or go to the nearest emergency department.  Please call the clinic during office hours if you have any questions or concerns.   You may also contact the Patient Navigator at [phone] should you have any questions or need assistance in obtaining follow up care.      Resources For Cancer Patients and their Caregivers ? American Cancer Society: Can assist with transportation, wigs, general needs, runs Look Good Feel Better.        9856554649 ? Cancer Care: Provides financial assistance, online support groups, medication/co-pay assistance.  1-800-813-HOPE (218)605-9653) ? [place] Assists [place] cancer patients and their families through emotional , educational and  financial support.  (564) 652-9943 ? [place] DSS Where to apply for food stamps, Medicaid and utility assistance. (816) 250-9440 ? [employer]: Transportation to medical appointments. 414-615-4467 ? Social Security Administration: May apply for disability if have a Stage IV cancer. ([phone] 7207961506 ? [employer], Disability and Transit Services: Assists with nutrition, care and transit needs. [phone]

## 2016-07-30 NOTE — Progress Notes (Signed)
Pt okay for tx per Dr. Whitney Muse based on 07/22/17 lab work.   Tolerated tx w/o adverse reaction.  Alert, in no distress.  VSS.  Discharged ambulatory in c/o spouse.

## 2016-07-30 NOTE — Patient Instructions (Addendum)
[place] at [facility] Discharge Instructions  RECOMMENDATIONS MADE BY THE CONSULTANT AND ANY TEST RESULTS WILL BE SENT TO YOUR REFERRING PHYSICIAN.  You were seen today by Dr. Whitney Muse  Return to clinic in 1 week with labs (CBC diff, CMP, Uric Acid, Phosphorus)  Allopurinol 300mg  tablet. Take 1 tablet daily.    Thank you for choosing [place] at [facility] to provide your oncology and hematology care.  To afford each patient quality time with our provider, please arrive at least 15 minutes before your scheduled appointment time.    If you have a lab appointment with the [facility] please come in thru the  Main Entrance and check in at the main information desk  You need to re-schedule your appointment should you arrive 10 or more minutes late.  We strive to give you quality time with our providers, and arriving late affects you and other patients whose appointments are after yours.  Also, if you no show three or more times for appointments you may be dismissed from the clinic at the providers discretion.     Again, thank you for choosing [facility].  Our hope is that these requests will decrease the amount of time that you wait before being seen by our physicians.       _____________________________________________________________  Should you have questions after your visit to [facility], please contact our office at [phone] between the hours of 8:30 a.m. and 4:30 p.m.  Voicemails left after 4:30 p.m. will not be returned until the following business day.  For prescription refill requests, have your pharmacy contact our office.       Resources For Cancer Patients and their Caregivers ? American Cancer Society: Can assist with transportation, wigs, general needs, runs Look Good Feel Better.        (610)301-5927 ? Cancer Care: Provides financial assistance, online support groups, medication/co-pay  assistance.  1-800-813-HOPE 201-469-2597) ? [place] Assists [place] cancer patients and their families through emotional , educational and financial support.  646-455-2679 ? [place] DSS Where to apply for food stamps, Medicaid and utility assistance. 778-797-2857 ? RCATS: Transportation to medical appointments. 3175162381 ? Social Security Administration: May apply for disability if have a Stage IV cancer. 501 568 8047 (301) 749-5314 ? [employer], Disability and Transit Services: Assists with nutrition, care and transit needs. [place] Support Programs: @10RELATIVEDAYS @ > Cancer Support Group  2nd Tuesday of the month 1pm-2pm, Journey Room  > Creative Journey  3rd Tuesday of the month 1130am-1pm, Journey Room  > Look Good Feel Better  1st Wednesday of the month 10am-12 noon, Journey Room (Call [facility] to register [phone])

## 2016-07-31 ENCOUNTER — Telehealth (HOSPITAL_COMMUNITY): Payer: Self-pay | Admitting: *Deleted

## 2016-07-31 NOTE — Telephone Encounter (Signed)
Contacted pt for 24 hr f/u post first cycle of RCHOP.  Pt states she is doing well, denies any s/s and has no complaints.  Instructed to call the clinic should she have any questions or concerns.

## 2016-07-31 NOTE — Addendum Note (Signed)
Addended by: Joie Bimler on: 07/31/2016 12:45 PM   Modules accepted: Orders

## 2016-08-04 ENCOUNTER — Telehealth (HOSPITAL_COMMUNITY): Payer: Self-pay | Admitting: Emergency Medicine

## 2016-08-04 LAB — CHROMOSOME ANALYSIS, BONE MARROW

## 2016-08-04 NOTE — Telephone Encounter (Signed)
Pt called and stated that she was feeling bad and weak.  She did not have a fever.  Did not have a taste for anything.  Taste buds have change.  I told her that taste buds can change with therapy and she can still feel bad at this point.  She has an appt 1/12 for labs and follow up to she how she is doing after her first round of chemotherapy.  She said that made her feel a little better.

## 2016-08-05 ENCOUNTER — Telehealth (HOSPITAL_COMMUNITY): Payer: Self-pay | Admitting: Emergency Medicine

## 2016-08-05 ENCOUNTER — Telehealth (HOSPITAL_COMMUNITY): Payer: Self-pay | Admitting: Hematology & Oncology

## 2016-08-05 ENCOUNTER — Other Ambulatory Visit (HOSPITAL_COMMUNITY): Payer: Self-pay | Admitting: Emergency Medicine

## 2016-08-05 MED ORDER — DICYCLOMINE HCL 20 MG PO TABS: 20.0000 mg | ORAL_TABLET | Freq: Three times a day (TID) | ORAL | 1 refills | Status: DC

## 2016-08-05 MED ORDER — PREDNISONE 20 MG PO TABS: ORAL_TABLET | ORAL | 3 refills | Status: DC

## 2016-08-05 NOTE — Telephone Encounter (Signed)
Pt called and was still feeling bad this morning.  Stating that she was having spasms from her chest all the way down to her groin.  No diarrhea or vomiting.  Spoke with Dr Whitney Muse and called in bentyl 4 times a day.  Also realized she did not have prednisone called in.  Told her to start that now.  Explained next time to start it on Day 1 and take it for the next four days after chemo.  She verbalized understanding.

## 2016-08-05 NOTE — Telephone Encounter (Signed)
PT APPROVED FOR FREE NEULASTA FROM [employer] 08/05/16-08/05/17

## 2016-08-06 ENCOUNTER — Encounter (HOSPITAL_COMMUNITY): Payer: Self-pay | Admitting: Emergency Medicine

## 2016-08-07 ENCOUNTER — Other Ambulatory Visit (HOSPITAL_COMMUNITY): Payer: Self-pay

## 2016-08-07 ENCOUNTER — Encounter (HOSPITAL_BASED_OUTPATIENT_CLINIC_OR_DEPARTMENT_OTHER): Payer: Commercial Managed Care - HMO | Admitting: Oncology

## 2016-08-07 ENCOUNTER — Encounter (HOSPITAL_COMMUNITY): Payer: Self-pay | Admitting: Oncology

## 2016-08-07 ENCOUNTER — Encounter (HOSPITAL_COMMUNITY): Payer: Self-pay

## 2016-08-07 VITALS — BP 118/85 | HR 84 | Temp 97.7°F | Resp 16 | Wt 119.3 lb

## 2016-08-07 DIAGNOSIS — C8335 Diffuse large B-cell lymphoma, lymph nodes of inguinal region and lower limb: Secondary | ICD-10-CM

## 2016-08-07 DIAGNOSIS — C8338 Diffuse large B-cell lymphoma, lymph nodes of multiple sites: Secondary | ICD-10-CM

## 2016-08-07 DIAGNOSIS — K219 Gastro-esophageal reflux disease without esophagitis: Secondary | ICD-10-CM | POA: Diagnosis not present

## 2016-08-07 LAB — COMPREHENSIVE METABOLIC PANEL
ALT: 18 U/L (ref 14–54)
AST: 16 U/L (ref 15–41)
Albumin: 4.3 g/dL (ref 3.5–5.0)
Alkaline Phosphatase: 73 U/L (ref 38–126)
Anion gap: 11 (ref 5–15)
BUN: 18 mg/dL (ref 6–20)
CHLORIDE: 99 mmol/L — AB (ref 101–111)
CO2: 26 mmol/L (ref 22–32)
CREATININE: 0.86 mg/dL (ref 0.44–1.00)
Calcium: 9.7 mg/dL (ref 8.9–10.3)
Glucose, Bld: 149 mg/dL — ABNORMAL HIGH (ref 65–99)
POTASSIUM: 3.9 mmol/L (ref 3.5–5.1)
Sodium: 136 mmol/L (ref 135–145)
Total Bilirubin: 0.6 mg/dL (ref 0.3–1.2)
Total Protein: 6.9 g/dL (ref 6.5–8.1)

## 2016-08-07 LAB — CBC WITH DIFFERENTIAL/PLATELET
BASOS PCT: 1 %
Basophils Absolute: 0 10*3/uL (ref 0.0–0.1)
EOS PCT: 0 %
Eosinophils Absolute: 0 10*3/uL (ref 0.0–0.7)
HCT: 41.8 % (ref 36.0–46.0)
Hemoglobin: 14.3 g/dL (ref 12.0–15.0)
LYMPHS ABS: 0.4 10*3/uL — AB (ref 0.7–4.0)
Lymphocytes Relative: 45 %
MCH: 31.4 pg (ref 26.0–34.0)
MCHC: 34.2 g/dL (ref 30.0–36.0)
MCV: 91.7 fL (ref 78.0–100.0)
MONO ABS: 0.1 10*3/uL (ref 0.1–1.0)
Monocytes Relative: 13 %
Neutro Abs: 0.4 10*3/uL — ABNORMAL LOW (ref 1.7–7.7)
Neutrophils Relative %: 41 %
PLATELETS: 139 10*3/uL — AB (ref 150–400)
RBC: 4.56 MIL/uL (ref 3.87–5.11)
RDW: 11.9 % (ref 11.5–15.5)
WBC: 0.9 10*3/uL — CL (ref 4.0–10.5)

## 2016-08-07 LAB — PHOSPHORUS: PHOSPHORUS: 3.8 mg/dL (ref 2.5–4.6)

## 2016-08-07 LAB — URIC ACID: Uric Acid, Serum: 2.7 mg/dL (ref 2.3–6.6)

## 2016-08-07 MED ORDER — PANTOPRAZOLE SODIUM 40 MG PO TBEC: 40.0000 mg | DELAYED_RELEASE_TABLET | Freq: Every day | ORAL | 5 refills | Status: DC

## 2016-08-07 NOTE — Patient Instructions (Addendum)
[place] at [facility] Discharge Instructions  RECOMMENDATIONS MADE BY THE CONSULTANT AND ANY TEST RESULTS WILL BE SENT TO YOUR REFERRING PHYSICIAN.  You saw Kirby Crigler, PA-C, today. Neutropenic precautions- do not be around fresh flowers or eat fresh fruit. Take Bentyl and Zofran 30 minutes prior to Allopurinol. Return as scheduled for follow up and cycle #2 Protonix cost 14.27 for 30 pills at [facility]. See Amy at checkout for appointments.  Thank you for choosing [place] at [facility] to provide your oncology and hematology care.  To afford each patient quality time with our provider, please arrive at least 15 minutes before your scheduled appointment time.    If you have a lab appointment with the [facility] please come in thru the  Main Entrance and check in at the main information desk  You need to re-schedule your appointment should you arrive 10 or more minutes late.  We strive to give you quality time with our providers, and arriving late affects you and other patients whose appointments are after yours.  Also, if you no show three or more times for appointments you may be dismissed from the clinic at the providers discretion.     Again, thank you for choosing [facility].  Our hope is that these requests will decrease the amount of time that you wait before being seen by our physicians.       _____________________________________________________________  Should you have questions after your visit to [facility], please contact our office at [phone] between the hours of 8:30 a.m. and 4:30 p.m.  Voicemails left after 4:30 p.m. will not be returned until the following business day.  For prescription refill requests, have your pharmacy contact our office.       Resources For Cancer Patients and their Caregivers ? American Cancer Society: Can assist with transportation, wigs, general  needs, runs Look Good Feel Better.        863-007-7743 ? Cancer Care: Provides financial assistance, online support groups, medication/co-pay assistance.  1-800-813-HOPE 615-117-1321) ? [facility] Assists [place] cancer patients and their families through emotional , educational and financial support.  (636) 851-1856 ? [place] DSS Where to apply for food stamps, Medicaid and utility assistance. 434-243-1443 ? [facility]: Transportation to medical appointments. 224-808-1660 ? Social Security Administration: May apply for disability if have a Stage IV cancer. (802) 353-2456 902-152-3585 ? [employer], Disability and Transit Services: Assists with nutrition, care and transit needs. [place] Support Programs: @10RELATIVEDAYS @ > Cancer Support Group  2nd Tuesday of the month 1pm-2pm, Journey Room  > Creative Journey  3rd Tuesday of the month 1130am-1pm, Journey Room  > Look Good Feel Better  1st Wednesday of the month 10am-12 noon, Journey Room (Call [place] to register [phone])

## 2016-08-07 NOTE — Progress Notes (Unsigned)
CRITICAL VALUE ALERT Critical value received:  WBC 0.9 Date of notification:  08-07-2016 Time of notification: 0810 Critical value read back:  Yes.   Nurse who received alert:  C.Lindell Tussey RN MD notified (1st Quinnetta Roepke):  Dr. Whitney Muse [phone]

## 2016-08-07 NOTE — Assessment & Plan Note (Addendum)
Stage III DLBCL, R-CHOP beginning on 07/30/2016.  Oncology history updated.  NADIR labs today: CBC diff, CMET, uric acid, phosphorus.  I personally reviewed and went over laboratory results with the patient.  The results are noted within this dictation.  Leukopenia and neutropenia is noted as expected at this time (NADIR).    Neutropenic precautions are provided.  She is provided a neutropenic precautions sheet.  She notes issues with nausea and abdominal cramping with allopurinol.  She is advised to take Zofran and Bentyl 30 minutes prior to allopurinol.    Since losing her insurance, Protonix is not affordable.  [facility]  [facility] and they report 40 mg Protonix #30 is $14.27.  Patient reports this is affordable.  Rx is escribed to [facility].  Restaging PET following cycle #3.  Order is placed proactively for restaging test.  Return as scheduled for follow-up and cycle #2 of treatment.

## 2016-08-07 NOTE — Progress Notes (Signed)
Gabriella Neighbors, MD [place] [address]  Diffuse large B-cell lymphoma of lymph nodes of multiple regions [facility]) - Plan: NM PET Image Restag (PS) Skull Base To Thigh  Gastroesophageal reflux disease, esophagitis presence not specified - Plan: pantoprazole (PROTONIX) 40 MG tablet  CURRENT THERAPY: R-CHOP beginning on 07/30/2016  INTERVAL HISTORY: Gabriella Love 61 y.o. female returns for followup of Stage III DLBCL, R-CHOP beginning on 07/30/2016.    DLBCL (diffuse large B cell lymphoma) ([place])   07/13/2016 Initial Biopsy    L inguinal biopsy with Dr. 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 Grade (if applicable): High grade. Flow cytometry: [id number] reveals a monoclonal B-cell population with expression of CD10. Immunohistochemical stains: CD20, CD3, CD10, bcl-2, bcl-6, CD21, CD23, Ki-67. Touch preps/imprints: Mixture of large and smaller lymphocytes with irregular nuclear contours. Comments: Sections of lymph node reveal effacement of the architecture by a nodular proliferation of neoplastic appearing follicles. In many of the areas the follicles coalesce and form sheets of large cells. The more formed follicles predominately consist of numerous large centroblasts (>15 per hpf), representing a high grade (3B) follicular component. The diffuse areas are also composed of large cells but lack follicular dendritic networks (CD21, CD23). Immunohistochemistry reveals the atypical lymphocytes are positive for CD20, CD10, and bcl-6. They are negative for bcl-2. Ki-67 is elevated (30% overall). CD3 reveals residual surrounding T-cells. Flow cytometry ([id number]) reveals a monoclonal B-cell population with expression of CD10. 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 The case was called to Dr. Rosana Hoes on 07/16/2016.      07/23/2016 Echocardiogram    Left ventricle: Systolic function was normal. The estimated ejection fraction was in the range of 50% to 55%. Doppler parameters are consistent with abnormal left ventricular relaxation (grade 1 diastolic dysfunction).      07/24/2016 Procedure    Technically successful CT guided right iliac bone core and aspiration biopsy by IR.      07/28/2016 PET scan    1. Hypermetabolic lymphadenopathy in the neck, abdomen and right inguinal area as described above, consistent with known lymphoma. 2. Focal area of hypermetabolism in the appendix could be lymphomatous involvement. Recommend attention on follow-up scans. 3. Age advanced atherosclerotic calcifications involving the abdominal aorta and branch vessels and age advanced three-vessel coronary artery calcifications. 4. No obvious osseous involvement. 5. Emphysematous changes and pulmonary scarring.      07/28/2016 Pathology Results    Bone Marrow, Aspirate,Biopsy, and Clot, right iliac - NORMOCELLULAR BONE MARROW WITH TRILINEAGE HEMATOPOIESIS. PERIPHERAL BLOOD: - OCCASIONAL CIRCULATING ATYPICAL LYMPHOID CELLS.      07/28/2016 Pathology Results    Bone Marrow Flow Cytometry - NO MONOCLONAL B CELL POPULATION OR ABNORMAL T CELL PHENOTYPE IDENTIFIED.      07/30/2016 Pathology Results    Peripheral Blood Flow Cytometry - NO MONOCLONAL B-CELL POPULATION OR ABNORMAL T-CELL PHENOTYPE IDENTIFIED.      07/30/2016 -  Chemotherapy    The patient had DOXOrubicin (ADRIAMYCIN) chemo injection 78 mg, 50 mg/m2 = 78 mg, Intravenous,  Once, 1 of 6 cycles  palonosetron (ALOXI) injection 0.25 mg, 0.25 mg, Intravenous,  Once, 1 of 6 cycles  pegfilgrastim (NEULASTA ONPRO KIT) injection 6 mg, 6  mg, Subcutaneous, Once, 1 of 6 cycles  vinCRIStine (ONCOVIN) 2 mg in sodium chloride 0.9 %  50 mL chemo infusion, 2 mg, Intravenous,  Once, 1 of 6 cycles  riTUXimab (RITUXAN) 600 mg in sodium chloride 0.9 % 250 mL (1.9355 mg/mL) chemo infusion, 375 mg/m2 = 600 mg, Intravenous,  Once, 1 of 6 cycles  cyclophosphamide (CYTOXAN) 1,180 mg in sodium chloride 0.9 % 250 mL chemo infusion, 750 mg/m2 = 1,180 mg, Intravenous,  Once, 1 of 6 cycles  for chemotherapy treatment.         She notes that over the weekend she felt terrible.  She notes nausea without vomiting.  She reports "ear ache."  But the way she describes it, it was more mastoid pain bilaterally.  She reports foods taste bad.  She notes cherry jello and shepherd pies are enjoyable for her.  Starting yesterday, her appetite has improved.  She noted abdominal spasms and therefore, Bentyl was called in for her.  She notes improvement in that symptom with Bentyl.  Her abdominal discomfort was so severe that she had difficult time maintaining hydration status.  She lost her insurance and now Protonix is not affordable for her.  She started her Prednisone late (08/06/2016).    She notes that yesterday and, so far, today have been good.  Review of Systems  Constitutional: Positive for malaise/fatigue and weight loss. Negative for chills and fever.  HENT: Negative.  Negative for congestion, sinus pain and sore throat.   Eyes: Negative.   Respiratory: Negative.  Negative for cough.   Cardiovascular: Negative.  Negative for chest pain.  Gastrointestinal: Positive for nausea. Negative for constipation, diarrhea and vomiting.  Genitourinary: Negative.   Musculoskeletal: Negative.   Skin: Negative.  Negative for rash.  Neurological: Negative.  Negative for weakness.  Endo/Heme/Allergies: Negative.   Psychiatric/Behavioral: Negative.     Past Medical History:  Diagnosis Date  . Anxiety   . Arthritis   . DLBCL (diffuse large B cell lymphoma) ([place]) 07/16/2016  . GERD (gastroesophageal reflux disease)   . Heart disease   .  Hyperlipidemia   . Hypertension   . Myocardial infarction 12/19/2009   released from cardiology    Past Surgical History:  Procedure Laterality Date  . CARDIAC CATHETERIZATION     cardiac stent  . CHOLECYSTECTOMY    . CORONARY STENT PLACEMENT  2011  . INGUINAL LYMPH NODE BIOPSY Left 07/13/2016   Procedure: EXCISIONAL BIOPSY OF LEFT INGUINAL LYMPH NODE;  Surgeon: Vickie Epley, MD;  Location: [facility];  Service: General;  Laterality: Left;  . PORTACATH PLACEMENT N/A 07/17/2016   Procedure: INSERTION OF TUNNELED CENTRAL VENOUS CATHETER WITH SUBCUTANEOUS PORT;  Surgeon: Vickie Epley, MD;  Location: [facility];  Service: Vascular;  Laterality: N/A;  . TUBAL LIGATION      Family History  Problem Relation Age of Onset  . Cancer Mother     colon  . Depression Mother   . Heart disease Father   . Depression Sister   . Cancer Sister     leukemia  . Hyperlipidemia Brother   . Hypertension Brother     Social History   Social History  . Marital status: Single    Spouse name: N/A  . Number of children: N/A  . Years of education: N/A   Social History Main Topics  . Smoking status: Current Every Day Smoker    Packs/day: 0.50    Years: 40.00    Types: Cigarettes  . Smokeless  tobacco: Never Used  . Alcohol use No  . Drug use: No  . Sexual activity: Yes    Birth control/ protection: Post-menopausal   Other Topics Concern  . None   Social History Narrative  . None     PHYSICAL EXAMINATION  ECOG PERFORMANCE STATUS: 1 - Symptomatic but completely ambulatory  Vitals:   08/07/16 0855  BP: 118/85  Pulse: 84  Resp: 16  Temp: 97.7 F (36.5 C)    GENERAL:alert, no distress, well developed, comfortable, cooperative, smiling and unaccompanied SKIN: skin color, texture, turgor are normal, no rashes or significant lesions HEAD: Normocephalic, No masses, lesions, tenderness or abnormalities EYES: normal, EOMI, Conjunctiva are pink and non-injected EARS: External ears  normal OROPHARYNX:lips, buccal mucosa, and tongue normal and mucous membranes are moist  NECK: supple, no adenopathy, trachea midline LYMPH:  no palpable lymphadenopathy BREAST:not examined LUNGS: clear to auscultation and percussion HEART: regular rate & rhythm ABDOMEN:abdomen soft, non-tender and normal bowel sounds BACK: Back symmetric, no curvature. EXTREMITIES:less then 2 second capillary refill, no joint deformities, effusion, or inflammation, no skin discoloration, no cyanosis  NEURO: alert & oriented x 3 with fluent speech, no focal motor/sensory deficits, gait normal   LABORATORY DATA: CBC    Component Value Date/Time   WBC 0.9 (LL) 08/07/2016 0800   RBC 4.56 08/07/2016 0800   HGB 14.3 08/07/2016 0800   HCT 41.8 08/07/2016 0800   PLT 139 (L) 08/07/2016 0800   MCV 91.7 08/07/2016 0800   MCH 31.4 08/07/2016 0800   MCHC 34.2 08/07/2016 0800   RDW 11.9 08/07/2016 0800   LYMPHSABS 0.4 (L) 08/07/2016 0800   MONOABS 0.1 08/07/2016 0800   EOSABS 0.0 08/07/2016 0800   BASOSABS 0.0 08/07/2016 0800      Chemistry      Component Value Date/Time   NA 136 08/07/2016 0800   K 3.9 08/07/2016 0800   CL 99 (L) 08/07/2016 0800   CO2 26 08/07/2016 0800   BUN 18 08/07/2016 0800   CREATININE 0.86 08/07/2016 0800      Component Value Date/Time   CALCIUM 9.7 08/07/2016 0800   ALKPHOS 73 08/07/2016 0800   AST 16 08/07/2016 0800   ALT 18 08/07/2016 0800   BILITOT 0.6 08/07/2016 0800     Lab Results  Component Value Date   CALCIUM 9.7 08/07/2016   PHOS 3.8 08/07/2016    Lab Results  Component Value Date   LABURIC 2.7 08/07/2016     PENDING LABS:   RADIOGRAPHIC STUDIES:  Chest 2 View  Result Date: 07/09/2016 CLINICAL DATA:  Preoperative examination. The patient reports a cough. There is a history of coronary artery disease with previous MI, bronchitis. Patient is a current smoker. EXAM: CHEST  2 VIEW COMPARISON:  PA and lateral chest x-ray of August 30, 2012  FINDINGS: The lungs remain hyperinflated. There is no focal infiltrate. The interstitial markings are mildly prominent. The heart and pulmonary vascularity are normal. The mediastinum is normal in width. There is calcification in the wall of the aortic arch. The trachea is midline. The bony thorax is unremarkable. IMPRESSION: Mild chronic bronchitic changes. No pneumonia, CHF, nor other acute cardiopulmonary abnormality. Thoracic aortic atherosclerosis. Electronically Signed   By: David  Martinique M.D.   On: 07/09/2016 11:49   US Guided Needle Placement  Result Date: 08/03/2016 CLINICAL DATA:  Ultrasound was provided for use by the ordering physician, and a technical charge was applied by the performing facility.  No radiologist interpretation/professional services rendered.  [place] Intraoperative  Result Date: 08/03/2016 CLINICAL DATA:  Ultrasound was provided for use by the ordering physician, and a technical charge was applied by the performing facility.  No radiologist interpretation/professional services rendered.   Nm Pet Image Initial (pi) Skull Base To Thigh  Result Date: 07/28/2016 CLINICAL DATA:  Initial treatment strategy for diffuse large B-cell lymphoma. EXAM: NUCLEAR MEDICINE PET SKULL BASE TO THIGH TECHNIQUE: 6.2 mCi F-18 FDG was injected intravenously. Full-ring PET imaging was performed from the skull base to thigh after the radiotracer. CT data was obtained and used for attenuation correction and anatomic localization. FASTING BLOOD GLUCOSE:  Value: 113 mg/dl COMPARISON:  None FINDINGS: NECK Moderate hypermetabolic lymphoid tissue at the base of the tongue with SUV max of 4.9. There is also diffuse hypermetabolic lymphoid tissue in the region of the tonsils and Waldeyer's ring. SUV max is 6.3. Single hypermetabolic left-sided level II B measuring 7 mm on image number 21. CHEST No hypermetabolic mediastinal or hilar nodes. No suspicious pulmonary nodules on the CT scan. Moderate emphysematous  changes and areas of compressive atelectasis. No acute findings or pleural effusion. Three-vessel coronary artery calcifications are noted. ABDOMEN/PELVIS 13.5 mm left-sided retroperitoneal lymph node on image number 509 is hypermetabolic with SUV max of 9.4. Left common iliac lymph node on image number 128 measures 13 mm and has an SUV max of 10.9. There is abnormal FDG activity noted in the distal aspect of the appendix. I do not see a discrete lesion but the appendix might be slightly thickened in this area. It is possible this is lymphomatous involvement. A small carcinoid tumor is also possibility. There is no inflammation involving the appendix. Recommend attention on future/follow-up scans. Enlarged right inguinal lymph node measuring 20.5 x 20 mm on image 173 is markedly hypermetabolic with SUV max of 32.6. Surgical changes in the left inguinal area from recent lymph node dissection. hypermetabolism is likely postsurgical. Age advanced atherosclerotic calcifications involving the aorta and branch vessels. 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 No splenomegaly or hepatic or splenic lesions are identified. SKELETON No focal hypermetabolic activity to suggest skeletal metastasis. IMPRESSION: 1. Hypermetabolic lymphadenopathy in the neck, abdomen and right inguinal area as described above, consistent with known lymphoma. 2. Focal area of hypermetabolism in the appendix could be lymphomatous involvement. Recommend attention on follow-up scans. 3. Age advanced atherosclerotic calcifications involving the abdominal aorta and branch vessels and age advanced three-vessel coronary artery calcifications. 4. No obvious osseous involvement. 5. Emphysematous changes and pulmonary scarring. Electronically Signed   By: Marijo Sanes M.D.   On: 07/28/2016 09:07   US Venous Img Upper Uni Right  Result Date: 08/03/2016 CLINICAL DATA:  Ultrasound was  provided for use by the ordering physician, and a technical charge was applied by the performing facility.  No radiologist interpretation/professional services rendered.   Ct Biopsy  Result Date: 07/24/2016 CLINICAL DATA:  Diffuse large B-cell lymphoma EXAM: CT GUIDED DEEP ILIAC BONE ASPIRATION AND CORE BIOPSY TECHNIQUE: The procedure, risks (including but not limited to bleeding, infection, organ damage ), benefits, and alternatives were explained to the patient. Questions regarding the procedure were encouraged and answered. The patient understands and consents to the procedure. Patient was placed supine on the CT gantry and limited axial scans through the pelvis were obtained. Appropriate skin entry site was identified. Skin site was marked, prepped with chlorhexidine, draped in usual sterile fashion, and infiltrated locally with 1% lidocaine. Intravenous Fentanyl  and Versed were administered as conscious sedation during continuous monitoring of the patient's level of consciousness and physiological / cardiorespiratory status by the radiology RN, with a total moderate sedation time of 7 minutes. Under CT fluoroscopic guidance an 11-gauge Cook trocar bone needle was advanced into the right iliac bone just lateral to the sacroiliac joint. Once needle tip position was confirmed, coaxial core and aspiration samples were obtained. The final sample was obtained using the guiding needle itself, which was then removed. Post procedure scans show no hematoma or fracture. Patient tolerated procedure well. COMPLICATIONS: COMPLICATIONS none IMPRESSION: 1. Technically successful CT guided right iliac bone core and aspiration biopsy. Electronically Signed   By: Lucrezia Europe M.D.   On: 07/24/2016 09:57   Dg Chest Port 1 View  Result Date: 07/17/2016 CLINICAL DATA:  Port-A-Cath placement. EXAM: PORTABLE CHEST 1 VIEW COMPARISON:  07/09/2016 . FINDINGS: PowerPort catheter noted with tip projected over the cavoatrial  junction. Heart size normal. Low lung volumes. No pleural effusion or pneumothorax. IMPRESSION: PowerPort catheter noted with tip at the caval atrial junction . No evidence of pneumothorax. Low lung volumes. Electronically Signed   By: Marcello Moores  Register   On: 07/17/2016 09:15   Dg C-arm 1-60 Min-no Report  Result Date: 07/17/2016 There is no Radiologist interpretation  for this exam.    PATHOLOGY:    ASSESSMENT AND PLAN:  DLBCL (diffuse large B cell lymphoma) ([place]) Stage III DLBCL, R-CHOP beginning on 07/30/2016.  Oncology history updated.  NADIR labs today: CBC diff, CMET, uric acid, phosphorus.  I personally reviewed and went over laboratory results with the patient.  The results are noted within this dictation.  Leukopenia and neutropenia is noted as expected at this time (NADIR).    Neutropenic precautions are provided.  She is provided a neutropenic precautions sheet.  She notes issues with nausea and abdominal cramping with allopurinol.  She is advised to take Zofran and Bentyl 30 minutes prior to allopurinol.    Since losing her insurance, Protonix is not affordable.  [facility]  [facility] and they report 40 mg Protonix #30 is $14.27.  Patient reports this is affordable.  Rx is escribed to [facility].  Restaging PET following cycle #3.  Order is placed proactively for restaging test.  Return as scheduled for follow-up and cycle #2 of treatment.   ORDERS PLACED FOR THIS ENCOUNTER: Orders Placed This Encounter  Procedures  . NM PET Image Restag (PS) Skull Base To Thigh    MEDICATIONS PRESCRIBED THIS ENCOUNTER: Meds ordered this encounter  Medications  . pantoprazole (PROTONIX) 40 MG tablet    Sig: Take 1 tablet (40 mg total) by mouth daily.    Dispense:  30 tablet    Refill:  5    Order Specific Question:   Supervising Provider    Answer:   Patrici Ranks [id number]    THERAPY PLAN:  R-CHOP x 6 cycles  All questions were  answered. The patient knows to call the clinic with any problems, questions or concerns. We can certainly see the patient much sooner if necessary.  Patient and plan discussed with Dr. Ancil Linsey and she is in agreement with the aforementioned.   This note is electronically signed by: Doy Mince 08/07/2016 9:36 AM

## 2016-08-19 ENCOUNTER — Encounter (HOSPITAL_COMMUNITY): Payer: Self-pay

## 2016-08-19 DIAGNOSIS — C8338 Diffuse large B-cell lymphoma, lymph nodes of multiple sites: Secondary | ICD-10-CM

## 2016-08-19 DIAGNOSIS — C8330 Diffuse large B-cell lymphoma, unspecified site: Secondary | ICD-10-CM

## 2016-08-19 LAB — COMPREHENSIVE METABOLIC PANEL
ALBUMIN: 4 g/dL (ref 3.5–5.0)
ALK PHOS: 103 U/L (ref 38–126)
ALT: 34 U/L (ref 14–54)
AST: 22 U/L (ref 15–41)
Anion gap: 8 (ref 5–15)
BUN: 11 mg/dL (ref 6–20)
CALCIUM: 9.5 mg/dL (ref 8.9–10.3)
CHLORIDE: 101 mmol/L (ref 101–111)
CO2: 28 mmol/L (ref 22–32)
CREATININE: 0.85 mg/dL (ref 0.44–1.00)
GFR calc Af Amer: >60 mL/min (ref >60)
GFR calc non Af Amer: >60 mL/min (ref >60)
GLUCOSE: 106 mg/dL — AB (ref 65–99)
Potassium: 3.7 mmol/L (ref 3.5–5.1)
SODIUM: 137 mmol/L (ref 135–145)
Total Bilirubin: 0.2 mg/dL — ABNORMAL LOW (ref 0.3–1.2)
Total Protein: 6.9 g/dL (ref 6.5–8.1)

## 2016-08-19 LAB — PHOSPHORUS: PHOSPHORUS: 3.9 mg/dL (ref 2.5–4.6)

## 2016-08-19 LAB — URIC ACID: Uric Acid, Serum: 2.7 mg/dL (ref 2.3–6.6)

## 2016-08-20 ENCOUNTER — Encounter (HOSPITAL_BASED_OUTPATIENT_CLINIC_OR_DEPARTMENT_OTHER): Payer: Commercial Managed Care - HMO

## 2016-08-20 ENCOUNTER — Encounter (HOSPITAL_BASED_OUTPATIENT_CLINIC_OR_DEPARTMENT_OTHER): Payer: Commercial Managed Care - HMO | Admitting: Hematology & Oncology

## 2016-08-20 ENCOUNTER — Encounter (HOSPITAL_COMMUNITY): Payer: Self-pay | Admitting: Hematology & Oncology

## 2016-08-20 VITALS — BP 130/67 | HR 98 | Temp 97.6°F | Resp 18 | Wt 118.1 lb

## 2016-08-20 VITALS — BP 137/65 | HR 86 | Temp 98.0°F | Resp 18

## 2016-08-20 DIAGNOSIS — C8330 Diffuse large B-cell lymphoma, unspecified site: Secondary | ICD-10-CM

## 2016-08-20 DIAGNOSIS — Z5111 Encounter for antineoplastic chemotherapy: Secondary | ICD-10-CM | POA: Diagnosis not present

## 2016-08-20 DIAGNOSIS — C8335 Diffuse large B-cell lymphoma, lymph nodes of inguinal region and lower limb: Secondary | ICD-10-CM

## 2016-08-20 DIAGNOSIS — Z5112 Encounter for antineoplastic immunotherapy: Secondary | ICD-10-CM | POA: Diagnosis not present

## 2016-08-20 DIAGNOSIS — C8338 Diffuse large B-cell lymphoma, lymph nodes of multiple sites: Secondary | ICD-10-CM

## 2016-08-20 LAB — CBC WITH DIFFERENTIAL/PLATELET
Basophils Absolute: 0.1 10*3/uL (ref 0.0–0.1)
Basophils Relative: 1 %
EOS PCT: 0 %
Eosinophils Absolute: 0 10*3/uL (ref 0.0–0.7)
HCT: 40.1 % (ref 36.0–46.0)
Hemoglobin: 13.5 g/dL (ref 12.0–15.0)
LYMPHS ABS: 1.2 10*3/uL (ref 0.7–4.0)
LYMPHS PCT: 12 %
MCH: 31.9 pg (ref 26.0–34.0)
MCHC: 33.7 g/dL (ref 30.0–36.0)
MCV: 94.8 fL (ref 78.0–100.0)
MONO ABS: 1.3 10*3/uL — AB (ref 0.1–1.0)
MONOS PCT: 14 %
Neutro Abs: 7.2 10*3/uL (ref 1.7–7.7)
Neutrophils Relative %: 73 %
PLATELETS: 517 10*3/uL — AB (ref 150–400)
RBC: 4.23 MIL/uL (ref 3.87–5.11)
RDW: 13.5 % (ref 11.5–15.5)
WBC: 9.8 10*3/uL (ref 4.0–10.5)

## 2016-08-20 MED ORDER — DEXAMETHASONE SODIUM PHOSPHATE 10 MG/ML IJ SOLN
INTRAMUSCULAR | Status: AC
  Filled 2016-08-20: qty 1

## 2016-08-20 MED ORDER — HEPARIN SOD (PORK) LOCK FLUSH 100 UNIT/ML IV SOLN
INTRAVENOUS | Status: AC
  Filled 2016-08-20: qty 5

## 2016-08-20 MED ORDER — DEXAMETHASONE SODIUM PHOSPHATE 10 MG/ML IJ SOLN
10.0000 mg | Freq: Once | INTRAMUSCULAR | Status: AC
  Administered 2016-08-20: 10 mg via INTRAVENOUS

## 2016-08-20 MED ORDER — VINCRISTINE SULFATE CHEMO INJECTION 1 MG/ML
2.0000 mg | Freq: Once | INTRAVENOUS | Status: AC
  Administered 2016-08-20: 2 mg via INTRAVENOUS
  Filled 2016-08-20: qty 2

## 2016-08-20 MED ORDER — HEPARIN SOD (PORK) LOCK FLUSH 100 UNIT/ML IV SOLN
500.0000 [IU] | Freq: Once | INTRAVENOUS | Status: AC | PRN
  Administered 2016-08-20: 500 [IU]
  Filled 2016-08-20: qty 5

## 2016-08-20 MED ORDER — SODIUM CHLORIDE 0.9 % IV SOLN
375.0000 mg/m2 | Freq: Once | INTRAVENOUS | Status: AC
  Administered 2016-08-20: 600 mg via INTRAVENOUS
  Filled 2016-08-20: qty 10

## 2016-08-20 MED ORDER — SODIUM CHLORIDE 0.9 % IV SOLN: 10.0000 mg | Freq: Once | INTRAVENOUS | Status: DC

## 2016-08-20 MED ORDER — ACETAMINOPHEN 325 MG PO TABS
650.0000 mg | ORAL_TABLET | Freq: Once | ORAL | Status: AC
  Administered 2016-08-20: 650 mg via ORAL

## 2016-08-20 MED ORDER — SODIUM CHLORIDE 0.9 % IV SOLN
Freq: Once | INTRAVENOUS | Status: AC
  Administered 2016-08-20: 10:00:00 via INTRAVENOUS

## 2016-08-20 MED ORDER — DIPHENHYDRAMINE HCL 25 MG PO CAPS
ORAL_CAPSULE | ORAL | Status: AC
  Filled 2016-08-20: qty 2

## 2016-08-20 MED ORDER — PALONOSETRON HCL INJECTION 0.25 MG/5ML
INTRAVENOUS | Status: AC
  Filled 2016-08-20: qty 5

## 2016-08-20 MED ORDER — PEGFILGRASTIM 6 MG/0.6ML ~~LOC~~ PSKT
PREFILLED_SYRINGE | SUBCUTANEOUS | Status: AC
  Filled 2016-08-20: qty 0.6

## 2016-08-20 MED ORDER — SODIUM CHLORIDE 0.9 % IV SOLN
750.0000 mg/m2 | Freq: Once | INTRAVENOUS | Status: AC
  Administered 2016-08-20: 1180 mg via INTRAVENOUS
  Filled 2016-08-20: qty 25

## 2016-08-20 MED ORDER — DOXORUBICIN HCL CHEMO IV INJECTION 2 MG/ML
50.0000 mg/m2 | Freq: Once | INTRAVENOUS | Status: AC
  Administered 2016-08-20: 78 mg via INTRAVENOUS
  Filled 2016-08-20: qty 39

## 2016-08-20 MED ORDER — PEGFILGRASTIM 6 MG/0.6ML ~~LOC~~ PSKT
6.0000 mg | PREFILLED_SYRINGE | Freq: Once | SUBCUTANEOUS | Status: AC
  Administered 2016-08-20: 6 mg via SUBCUTANEOUS

## 2016-08-20 MED ORDER — SODIUM CHLORIDE 0.9% FLUSH
10.0000 mL | INTRAVENOUS | Status: DC | PRN
  Administered 2016-08-20: 10 mL
  Filled 2016-08-20: qty 10

## 2016-08-20 MED ORDER — DIPHENHYDRAMINE HCL 25 MG PO CAPS
50.0000 mg | ORAL_CAPSULE | Freq: Once | ORAL | Status: AC
  Administered 2016-08-20: 50 mg via ORAL

## 2016-08-20 MED ORDER — ACETAMINOPHEN 325 MG PO TABS
ORAL_TABLET | ORAL | Status: AC
  Filled 2016-08-20: qty 2

## 2016-08-20 MED ORDER — PALONOSETRON HCL INJECTION 0.25 MG/5ML
0.2500 mg | Freq: Once | INTRAVENOUS | Status: AC
  Administered 2016-08-20: 0.25 mg via INTRAVENOUS

## 2016-08-20 NOTE — Patient Instructions (Addendum)
[place] at [facility] Discharge Instructions  RECOMMENDATIONS MADE BY THE CONSULTANT AND ANY TEST RESULTS WILL BE SENT TO YOUR REFERRING PHYSICIAN.  You were seen today by Dr. Hurshel Party or Shona Needles will be in contact with you for nutrition consult Follow up in clinic in 3 weeks w/chemo and labs   Thank you for choosing [place] at [facility] to provide your oncology and hematology care.  To afford each patient quality time with our provider, please arrive at least 15 minutes before your scheduled appointment time.    If you have a lab appointment with the [facility] please come in thru the  Main Entrance and check in at the main information desk  You need to re-schedule your appointment should you arrive 10 or more minutes late.  We strive to give you quality time with our providers, and arriving late affects you and other patients whose appointments are after yours.  Also, if you no show three or more times for appointments you may be dismissed from the clinic at the providers discretion.     Again, thank you for choosing [facility].  Our hope is that these requests will decrease the amount of time that you wait before being seen by our physicians.       _____________________________________________________________  Should you have questions after your visit to [facility], please contact our office at [phone] between the hours of 8:30 a.m. and 4:30 p.m.  Voicemails left after 4:30 p.m. will not be returned until the following business day.  For prescription refill requests, have your pharmacy contact our office.       Resources For Cancer Patients and their Caregivers ? American Cancer Society: Can assist with transportation, wigs, general needs, runs Look Good Feel Better.        419-775-4419 ? Cancer Care: Provides financial assistance, online support groups, medication/co-pay assistance.   1-800-813-HOPE 445 097 6317) ? [facility] Assists [place] cancer patients and their families through emotional , educational and financial support.  (714) 419-1961 ? [place] DSS Where to apply for food stamps, Medicaid and utility assistance. 858-793-4573 ? RCATS: Transportation to medical appointments. 408 858 0720 ? Social Security Administration: May apply for disability if have a Stage IV cancer. 249-507-8895 (445) 449-3821 ? [employer], Disability and Transit Services: Assists with nutrition, care and transit needs. [place] Support Programs: @10RELATIVEDAYS @ > Cancer Support Group  2nd Tuesday of the month 1pm-2pm, Journey Room  > Creative Journey  3rd Tuesday of the month 1130am-1pm, Journey Room  > Look Good Feel Better  1st Wednesday of the month 10am-12 noon, Journey Room (Call [place] to register [phone])

## 2016-08-20 NOTE — Patient Instructions (Signed)
[facility] Discharge Instructions for Patients Receiving Chemotherapy   Beginning January 23rd 2017 lab work for the Cancer Center will be done in the  Main lab at [facility] on 1st floor. If you have a lab appointment with the Cancer Center please come in thru the  Main Entrance and check in at the main information desk   Today you received the following chemotherapy agents Rituxan,Adriamycin,Vincristine and Cytoxan as well as Neulasta on-pro. Follow-up as scheduled. Call clinic for any questions or concerns  To help prevent nausea and vomiting after your treatment, we encourage you to take your nausea medication   If you develop nausea and vomiting, or diarrhea that is not controlled by your medication, call the clinic.  The clinic phone number is [phone]. Office hours are Monday-Friday 8:30am-5:00pm.  BELOW ARE SYMPTOMS THAT SHOULD BE REPORTED IMMEDIATELY:  *FEVER GREATER THAN 101.0 F  *CHILLS WITH OR WITHOUT FEVER  NAUSEA AND VOMITING THAT IS NOT CONTROLLED WITH YOUR NAUSEA MEDICATION  *UNUSUAL SHORTNESS OF BREATH  *UNUSUAL BRUISING OR BLEEDING  TENDERNESS IN MOUTH AND THROAT WITH OR WITHOUT PRESENCE OF ULCERS  *URINARY PROBLEMS  *BOWEL PROBLEMS  UNUSUAL RASH Items with * indicate a potential emergency and should be followed up as soon as possible. If you have an emergency after office hours please contact your primary care physician or go to the nearest emergency department.  Please call the clinic during office hours if you have any questions or concerns.   You may also contact the Patient Navigator at [phone] should you have any questions or need assistance in obtaining follow up care.      Resources For Cancer Patients and their Caregivers ? American Cancer Society: Can assist with transportation, wigs, general needs, runs Look Good Feel Better.        1-888-227-6333 ? Cancer Care: Provides financial assistance, online support  groups, medication/co-pay assistance.  1-800-813-HOPE (4673) ? [facility] Assists [place] cancer patients and their families through emotional , educational and financial support.  336-427-4357 ? [place] DSS Where to apply for food stamps, Medicaid and utility assistance. 336-342-1394 ? RCATS: Transportation to medical appointments. 336-347-2287 ? Social Security Administration: May apply for disability if have a Stage IV cancer. 336-342-7796 1-800-772-1213 ? [place] Aging, Disability and Transit Services: Assists with nutrition, care and transit needs. 336-349-2343         

## 2016-08-20 NOTE — Progress Notes (Signed)
Shayli L Breshears tolerated chemo tx well without complaints or incident. Labs reviewed prior to administering chemotherapy.Neulasta on-pro applied to pt's left arm with green indicator light flashing. VSS upon discharge. Pt discharged self ambulatory in satisfactory condition accompanied by her husband

## 2016-08-20 NOTE — Progress Notes (Signed)
[place]  Progress Note  Patient Care Team: Celene Squibb, MD as PCP - General (Internal Medicine)  CHIEF COMPLAINTS/PURPOSE OF CONSULTATION:     DLBCL (diffuse large B cell lymphoma) ([place])   07/13/2016 Initial Biopsy    L inguinal biopsy with Dr. 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 Grade (if applicable): High grade. Flow cytometry: [id number] reveals a monoclonal B-cell population with expression of CD10. Immunohistochemical stains: CD20, CD3, CD10, bcl-2, bcl-6, CD21, CD23, Ki-67. Touch preps/imprints: Mixture of large and smaller lymphocytes with irregular nuclear contours. Comments: Sections of lymph node reveal effacement of the architecture by a nodular proliferation of neoplastic appearing follicles. In many of the areas the follicles coalesce and form sheets of large cells. The more formed follicles predominately consist of numerous large centroblasts (>15 per hpf), representing a high grade (3B) follicular component. The diffuse areas are also composed of large cells but lack follicular dendritic networks (CD21, CD23). Immunohistochemistry reveals the atypical lymphocytes are positive for CD20, CD10, and bcl-6. They are negative for bcl-2. Ki-67 is elevated (30% overall). CD3 reveals residual surrounding T-cells. Flow cytometry ([id number]) reveals a monoclonal B-cell population with expression of CD10. 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 The case was called to Dr. Rosana Hoes on 07/16/2016.      07/23/2016 Echocardiogram    Left ventricle: Systolic function was normal. The estimated ejection fraction was in the range of 50% to 55%. Doppler parameters are consistent  with abnormal left ventricular relaxation (grade 1 diastolic dysfunction).      07/24/2016 Procedure    Technically successful CT guided right iliac bone core and aspiration biopsy by IR.      07/28/2016 PET scan    1. Hypermetabolic lymphadenopathy in the neck, abdomen and right inguinal area as described above, consistent with known lymphoma. 2. Focal area of hypermetabolism in the appendix could be lymphomatous involvement. Recommend attention on follow-up scans. 3. Age advanced atherosclerotic calcifications involving the abdominal aorta and branch vessels and age advanced three-vessel coronary artery calcifications. 4. No obvious osseous involvement. 5. Emphysematous changes and pulmonary scarring.      07/28/2016 Pathology Results    Bone Marrow, Aspirate,Biopsy, and Clot, right iliac - NORMOCELLULAR BONE MARROW WITH TRILINEAGE HEMATOPOIESIS. PERIPHERAL BLOOD: - OCCASIONAL CIRCULATING ATYPICAL LYMPHOID CELLS.      07/28/2016 Pathology Results    Bone Marrow Flow Cytometry - NO MONOCLONAL B CELL POPULATION OR ABNORMAL T CELL PHENOTYPE IDENTIFIED.      07/30/2016 Pathology Results    Peripheral Blood Flow Cytometry - NO MONOCLONAL B-CELL POPULATION OR ABNORMAL T-CELL PHENOTYPE IDENTIFIED.      07/30/2016 -  Chemotherapy    The patient had DOXOrubicin (ADRIAMYCIN) chemo injection 78 mg, 50 mg/m2 = 78 mg, Intravenous,  Once, 1 of 6 cycles  palonosetron (ALOXI) injection 0.25 mg, 0.25 mg, Intravenous,  Once, 1 of 6 cycles  pegfilgrastim (NEULASTA ONPRO KIT) injection 6 mg, 6 mg, Subcutaneous, Once, 1 of 6 cycles  vinCRIStine (ONCOVIN) 2 mg in sodium chloride 0.9 % 50 mL chemo infusion, 2 mg, Intravenous,  Once, 1 of 6 cycles  riTUXimab (RITUXAN) 600 mg in sodium chloride 0.9 % 250 mL (1.9355 mg/mL) chemo infusion, 375 mg/m2 = 600 mg, Intravenous,  Once, 1 of 6 cycles  cyclophosphamide (CYTOXAN) 1,180 mg in sodium chloride 0.9 % 250 mL chemo infusion, 750 mg/m2 = 1,180 mg,  Intravenous,  Once, 1 of 6 cycles  for chemotherapy treatment.         HISTORY OF PRESENTING ILLNESS:  Gabriella Love 61 y.o. female is here because of referral from Dr. Tama High for Diffuse large B-cell lymphoma, stage III disease.   The patient originally presented to see Dr. Tama High of [facility] on 07/08/2016 for the evaluation of a large non-painful firm left groin mass that she noticed 11 days prior while taking a bath.   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   The patient presents to the clinic accompanied by her husband. I personally reviewed and went over labs with the patient.  She has lost weight, about 8-10 pounds during her last cycle according to her husband.  States she gets tired very easily. She was very weak and trembly.  The Saturday and Sunday after her chemotherapy cycle she thought she was "dying"  She states she sleeps a couple hours and then is awake for 1-2 hours and then sleeps again for a couple hours.    She states they forgot to call in her prednisone, but they called it in 7 days after her treatment and after she took it she started feeling better.   States she gets diarrhea once in a while and its loose. She does not use imodium   Denies neuropathy but when she sleeps she raises her arms above her head and her left hand goes to "sleep". She has tried to leave her arms below her head while sleeping now and it seems to help.    States first 2 weeks after chemo she was very tender in her neck but it has since passed.    MEDICAL HISTORY:  Past Medical History:  Diagnosis Date  . Anxiety   . Arthritis   . DLBCL (diffuse large B cell lymphoma) ([place]) 07/16/2016  . GERD (gastroesophageal reflux disease)   . Heart disease   . Hyperlipidemia   . Hypertension   . Myocardial infarction 12/19/2009   released from cardiology     SURGICAL HISTORY: Past Surgical History:  Procedure Laterality Date  . CARDIAC CATHETERIZATION     cardiac stent  . CHOLECYSTECTOMY    . CORONARY STENT PLACEMENT  2011  . INGUINAL LYMPH NODE BIOPSY Left 07/13/2016   Procedure: EXCISIONAL BIOPSY OF LEFT INGUINAL LYMPH NODE;  Surgeon: Vickie Epley, MD;  Location: [facility];  Service: General;  Laterality: Left;  . PORTACATH PLACEMENT N/A 07/17/2016   Procedure: INSERTION OF TUNNELED CENTRAL VENOUS CATHETER WITH SUBCUTANEOUS PORT;  Surgeon: Vickie Epley, MD;  Location: [facility];  Service: Vascular;  Laterality: N/A;  . TUBAL LIGATION      SOCIAL HISTORY: Social History   Social History  . Marital status: Single    Spouse name: N/A  . Number of children: N/A  . Years of education: N/A   Occupational History  . Not on file.   Social History Main Topics  . Smoking status: Current Every Day Smoker    Packs/day: 0.50    Years: 40.00    Types: Cigarettes  . Smokeless tobacco: Never Used  . Alcohol use No  . Drug use: No  . Sexual activity: Yes    Birth control/ protection: Post-menopausal  Other Topics Concern  . Not on file   Social History Narrative  . No narrative on file   Been with her boyfriend since 2000/10/01 1 daughter 3 grandchildren Smoker, 1 to 1.5 ppd. Recently cut down. Started at 61 y.o. ETOH, rare She enjoys reading She was laid off on December 1st   FAMILY HISTORY: Family History  Problem Relation Age of Onset  . Cancer Mother     colon  . Depression Mother   . Heart disease Father   . Depression Sister   . Cancer Sister     leukemia  . Hyperlipidemia Brother   . Hypertension Brother    Mother deceased in early 92s. Diagnosed in 1995-10-02 with colon cancer and passed in Oct 01, 1997 Father deceased at 76 years old of heart attack. Sister with leukemia in regression who passed away of congestive heart failure at 60 years-old. She was estranged and they only got back together about 6 months before she  passed away. Brother with high blood pressure and high cholesterol. This sibling is also estranged. He smokes and drinks heavily.  ALLERGIES:  is allergic to amoxicillin; choline fenofibrate; oxycodone-acetaminophen; and penicillins.  MEDICATIONS:  Current Outpatient Prescriptions  Medication Sig Dispense Refill  . acetaminophen (TYLENOL) 650 MG CR tablet Take 650 mg by mouth every 8 (eight) hours as needed for pain.    Marland Kitchen allopurinol (ZYLOPRIM) 300 MG tablet Take 1 tablet (300 mg total) by mouth daily. 30 tablet 3  . ALPRAZolam (XANAX) 0.5 MG tablet Take 0.5 mg by mouth daily as needed for anxiety.    Marland Kitchen aspirin 325 MG tablet Take 325 mg by mouth daily.      Marland Kitchen buPROPion (WELLBUTRIN XL) 150 MG 24 hr tablet Take 150 mg by mouth every evening.    . cyclophosphamide (CYTOXAN) 2 g chemo injection Inject into the vein once.    . dicyclomine (BENTYL) 20 MG tablet Take 1 tablet (20 mg total) by mouth 4 (four) times daily -  before meals and at bedtime. 120 tablet 1  . DOXOrubicin HCl (ADRIAMYCIN IV) Inject into the vein.    . fluticasone (FLONASE) 50 MCG/ACT nasal spray Place 1 spray into both nostrils daily as needed for allergies.     Marland Kitchen ketoconazole (NIZORAL) 2 % cream Apply 1 application topically daily. Apply to irritated skin when dry. 30 g 1  . lidocaine-prilocaine (EMLA) cream Apply a quarter size amount to affected area 1 hour prior to coming to chemotherapy. 30 g 2  . metoprolol succinate (TOPROL-XL) 25 MG 24 hr tablet Take 25 mg by mouth daily.      . nitroGLYCERIN (NITROSTAT) 0.4 MG SL tablet Place 0.4 mg under the tongue every 5 (five) minutes as needed for chest pain.     Marland Kitchen ondansetron (ZOFRAN) 8 MG tablet Take 1 tablet (8 mg total) by mouth every 8 (eight) hours as needed for nausea or vomiting. 30 tablet 2  . pantoprazole (PROTONIX) 40 MG tablet Take 1 tablet (40 mg total) by mouth daily. 30 tablet 5  . Pegfilgrastim (NEULASTA ONPRO [place]) Inject into the skin.    . predniSONE (DELTASONE)  20 MG tablet Take 3 tablets (60 mg) daily on days 1-5 of chemotherapy. 30 tablet 3  . prochlorperazine (COMPAZINE) 10 MG tablet Take 1 tablet (10 mg total) by mouth every 6 (six) hours as needed for nausea or vomiting. 30 tablet 2  . RiTUXimab (RITUXAN IV) Inject into the vein.    . simvastatin (ZOCOR) 40 MG  tablet Take 40 mg by mouth at bedtime.      . vinCRIStine 2 mg in sodium chloride 0.9 % 50 mL Inject into the vein once.     No current facility-administered medications for this visit.    Facility-Administered Medications Ordered in Other Visits  Medication Dose Route Frequency Provider Last Rate Last Dose  . cyclophosphamide (CYTOXAN) 1,180 mg in sodium chloride 0.9 % 250 mL chemo infusion  750 mg/m2 (Treatment Plan Recorded) Intravenous Once Patrici Ranks, MD      . DOXOrubicin (ADRIAMYCIN) chemo injection 78 mg  50 mg/m2 (Treatment Plan Recorded) Intravenous Once Patrici Ranks, MD      . heparin lock flush 100 unit/mL  500 Units Intracatheter Once PRN Patrici Ranks, MD      . pegfilgrastim (NEULASTA ONPRO KIT) injection 6 mg  6 mg Subcutaneous Once Patrici Ranks, MD      . riTUXimab (RITUXAN) 600 mg in sodium chloride 0.9 % 250 mL (1.9355 mg/mL) chemo infusion  375 mg/m2 (Treatment Plan Recorded) Intravenous Once Patrici Ranks, MD      . sodium chloride flush (NS) 0.9 % injection 10 mL  10 mL Intracatheter PRN Patrici Ranks, MD   10 mL at 08/20/16 0845  . vinCRIStine (ONCOVIN) 2 mg in sodium chloride 0.9 % 50 mL chemo infusion  2 mg Intravenous Once Patrici Ranks, MD       Review of Systems  Constitutional: Positive for malaise/fatigue and weight loss (8-10 pounds during last cycle).  HENT: Negative.   Eyes: Negative.   Respiratory: Negative.   Cardiovascular: Negative.   Gastrointestinal: Positive for diarrhea (once in a while).  Genitourinary: Negative.   Musculoskeletal: Negative.   Skin: Negative.   Neurological: Positive for weakness. Negative for  tingling.  Endo/Heme/Allergies: Negative.   Psychiatric/Behavioral: The patient has insomnia (sleeps for a couple hours and then wakes up for 1-2 hours).   All other systems reviewed and are negative. 14 point ROS was done and is otherwise as detailed above or in HPI   PHYSICAL EXAMINATION: ECOG PERFORMANCE STATUS: 1 - Symptomatic but completely ambulatory  Vitals:   08/20/16 0913  BP: 130/67  Pulse: 98  Resp: 18  Temp: 97.6 F (36.4 C)   Filed Weights   08/20/16 0913  Weight: 118 lb 1.6 oz (53.6 kg)    Physical Exam  Constitutional: She is oriented to person, place, and time and well-developed, well-nourished, and in no distress.  HENT:  Head: Normocephalic and atraumatic.  Mouth/Throat: Oropharynx is clear and moist. No oropharyngeal exudate.  Eyes: Conjunctivae and EOM are normal. Pupils are equal, round, and reactive to light. No scleral icterus.  Neck: Normal range of motion. Neck supple.  Cardiovascular: Normal rate, regular rhythm and normal heart sounds.   No murmur heard. Pulmonary/Chest: Effort normal and breath sounds normal. No respiratory distress. She has no wheezes.  Abdominal: Soft. Bowel sounds are normal. She exhibits no distension and no mass. There is no tenderness. There is no rebound and no guarding.  Musculoskeletal: Normal range of motion.  Lymphadenopathy:    She has no cervical adenopathy.  Neurological: She is alert and oriented to person, place, and time. No cranial nerve deficit. Gait normal.  Skin: Skin is warm and dry.  Psychiatric: Mood, memory, affect and judgment normal.  Nursing note and vitals reviewed.    LABORATORY DATA:  I have reviewed the data as listed Lab Results  Component Value Date   WBC  9.8 08/20/2016   HGB 13.5 08/20/2016   HCT 40.1 08/20/2016   MCV 94.8 08/20/2016   PLT 517 (H) 08/20/2016   CMP     Component Value Date/Time   NA 137 08/19/2016 1315   K 3.7 08/19/2016 1315   CL 101 08/19/2016 1315   CO2 28  08/19/2016 1315   GLUCOSE 106 (H) 08/19/2016 1315   BUN 11 08/19/2016 1315   CREATININE 0.85 08/19/2016 1315   CALCIUM 9.5 08/19/2016 1315   PROT 6.9 08/19/2016 1315   ALBUMIN 4.0 08/19/2016 1315   AST 22 08/19/2016 1315   ALT 34 08/19/2016 1315   ALKPHOS 103 08/19/2016 1315   BILITOT 0.2 (L) 08/19/2016 1315   GFRNONAA >60 08/19/2016 1315   GFRAA >60 08/19/2016 1315   PATHOLOGY       RADIOGRAPHIC STUDIES: I have personally reviewed the radiological images as listed and agreed with the findings in the report. No results found.  Study Result   CLINICAL DATA:  Initial treatment strategy for diffuse large B-cell lymphoma.  EXAM: NUCLEAR MEDICINE PET SKULL BASE TO THIGH  TECHNIQUE: 6.2 mCi F-18 FDG was injected intravenously. Full-ring PET imaging was performed from the skull base to thigh after the radiotracer. CT data was obtained and used for attenuation correction and anatomic localization.  FASTING BLOOD GLUCOSE:  Value: 113 mg/dl  COMPARISON:  None  FINDINGS: NECK  Moderate hypermetabolic lymphoid tissue at the base of the tongue with SUV max of 4.9. There is also diffuse hypermetabolic lymphoid tissue in the region of the tonsils and Waldeyer's ring. SUV max is 6.3.  Single hypermetabolic left-sided level II B measuring 7 mm on image number 21.  CHEST  No hypermetabolic mediastinal or hilar nodes. No suspicious pulmonary nodules on the CT scan.  Moderate emphysematous changes and areas of compressive atelectasis. No acute findings or pleural effusion.  Three-vessel coronary artery calcifications are noted.  ABDOMEN/PELVIS  13.5 mm left-sided retroperitoneal lymph node on image number 413 is hypermetabolic with SUV max of 9.4.  Left common iliac lymph node on image number 128 measures 13 mm and has an SUV max of 10.9.  There is abnormal FDG activity noted in the distal aspect of the appendix. I do not see a discrete lesion but  the appendix might be slightly thickened in this area. It is possible this is lymphomatous involvement. A small carcinoid tumor is also possibility. There is no inflammation involving the appendix. Recommend attention on future/follow-up scans.  Enlarged right inguinal lymph node measuring 20.5 x 20 mm on image 173 is markedly hypermetabolic with SUV max of 24.4.  Surgical changes in the left inguinal area from recent lymph node dissection. hypermetabolism is likely postsurgical.  Age advanced atherosclerotic calcifications involving the aorta and branch vessels. 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  No splenomegaly or hepatic or splenic lesions are identified.  SKELETON  No focal hypermetabolic activity to suggest skeletal metastasis.  IMPRESSION: 1. Hypermetabolic lymphadenopathy in the neck, abdomen and right inguinal area as described above, consistent with known lymphoma. 2. Focal area of hypermetabolism in the appendix could be lymphomatous involvement. Recommend attention on follow-up scans. 3. Age advanced atherosclerotic calcifications involving the abdominal aorta and branch vessels and age advanced three-vessel coronary artery calcifications. 4. No obvious osseous involvement. 5. Emphysematous changes and pulmonary scarring.   Electronically Signed   By: Marijo Sanes M.D.   On: 07/28/2016 09:07    ASSESSMENT &  PLAN:  Diffuse large B-cell lymphoma No B symptoms Tobacco use History MI   Labs reviewed. Results noted above. We discussed her weight loss and addressed trying to eat small frequent meals or trying nutritional supplements.   She was encouraged to use imodium if she has diarrhea or recurrent loose stool.  I encouraged her to call with problems after her next cycle.  She is ok for cycle #2 today. She is to start prednisone today.   RTC on 09/10/16 for her next  cycle.   Orders Placed This Encounter  Procedures  . CBC with Differential    Standing Status:   Future    Standing Expiration Date:   10/01/2016  . Comprehensive metabolic panel    Standing Status:   Future    Standing Expiration Date:   10/01/2016  . Lactate dehydrogenase    Standing Status:   Future    Standing Expiration Date:   10/01/2016   All questions were answered. The patient knows to call the clinic with any problems, questions or concerns.  This document serves as a record of services personally performed by Ancil Linsey, MD. It was created on her behalf by Shirlean Mylar, a trained medical scribe. The creation of this record is based on the scribe's personal observations and the provider's statements to them. This document has been checked and approved by the attending provider.  I have reviewed the above documentation for accuracy and completeness and I agree with the above.  [place] Fam. Claud Gowan, M.D.  This note was electronically signed.   08/20/2016 10:02 AM

## 2016-08-21 ENCOUNTER — Encounter (HOSPITAL_COMMUNITY): Payer: Self-pay

## 2016-08-25 ENCOUNTER — Encounter (HOSPITAL_COMMUNITY): Payer: Self-pay | Admitting: Hematology & Oncology

## 2016-09-01 ENCOUNTER — Encounter (HOSPITAL_COMMUNITY): Payer: Self-pay | Admitting: Hematology & Oncology

## 2016-09-09 ENCOUNTER — Encounter (HOSPITAL_COMMUNITY): Payer: Self-pay | Attending: Oncology

## 2016-09-09 DIAGNOSIS — C8330 Diffuse large B-cell lymphoma, unspecified site: Secondary | ICD-10-CM

## 2016-09-09 DIAGNOSIS — C8338 Diffuse large B-cell lymphoma, lymph nodes of multiple sites: Secondary | ICD-10-CM

## 2016-09-09 LAB — CBC WITH DIFFERENTIAL/PLATELET
BASOS PCT: 1 %
Basophils Absolute: 0 10*3/uL (ref 0.0–0.1)
EOS PCT: 0 %
Eosinophils Absolute: 0 10*3/uL (ref 0.0–0.7)
HEMATOCRIT: 40.3 % (ref 36.0–46.0)
Hemoglobin: 13.5 g/dL (ref 12.0–15.0)
LYMPHS PCT: 11 %
Lymphs Abs: 0.9 10*3/uL (ref 0.7–4.0)
MCH: 32 pg (ref 26.0–34.0)
MCHC: 33.5 g/dL (ref 30.0–36.0)
MCV: 95.5 fL (ref 78.0–100.0)
MONO ABS: 0.8 10*3/uL (ref 0.1–1.0)
MONOS PCT: 10 %
NEUTROS ABS: 6.5 10*3/uL (ref 1.7–7.7)
Neutrophils Relative %: 78 %
Platelets: 349 10*3/uL (ref 150–400)
RBC: 4.22 MIL/uL (ref 3.87–5.11)
RDW: 16.1 % — AB (ref 11.5–15.5)
WBC: 8.2 10*3/uL (ref 4.0–10.5)

## 2016-09-09 LAB — COMPREHENSIVE METABOLIC PANEL
ALT: 23 U/L (ref 14–54)
ANION GAP: 9 (ref 5–15)
AST: 20 U/L (ref 15–41)
Albumin: 4.4 g/dL (ref 3.5–5.0)
Alkaline Phosphatase: 67 U/L (ref 38–126)
BILIRUBIN TOTAL: 0.8 mg/dL (ref 0.3–1.2)
BUN: 12 mg/dL (ref 6–20)
CO2: 27 mmol/L (ref 22–32)
Calcium: 9.7 mg/dL (ref 8.9–10.3)
Chloride: 102 mmol/L (ref 101–111)
Creatinine, Ser: 0.78 mg/dL (ref 0.44–1.00)
Glucose, Bld: 118 mg/dL — ABNORMAL HIGH (ref 65–99)
POTASSIUM: 4.1 mmol/L (ref 3.5–5.1)
Sodium: 138 mmol/L (ref 135–145)
TOTAL PROTEIN: 7.1 g/dL (ref 6.5–8.1)

## 2016-09-09 LAB — URIC ACID: URIC ACID, SERUM: 2.4 mg/dL (ref 2.3–6.6)

## 2016-09-09 LAB — PHOSPHORUS: Phosphorus: 4 mg/dL (ref 2.5–4.6)

## 2016-09-09 LAB — LACTATE DEHYDROGENASE: LDH: 174 U/L (ref 98–192)

## 2016-09-10 ENCOUNTER — Encounter (HOSPITAL_BASED_OUTPATIENT_CLINIC_OR_DEPARTMENT_OTHER): Payer: Commercial Managed Care - HMO

## 2016-09-10 ENCOUNTER — Encounter (HOSPITAL_COMMUNITY): Payer: Self-pay | Admitting: Oncology

## 2016-09-10 ENCOUNTER — Encounter (HOSPITAL_COMMUNITY): Payer: Commercial Managed Care - HMO | Attending: Oncology | Admitting: Oncology

## 2016-09-10 VITALS — BP 121/63 | HR 88 | Temp 98.1°F | Resp 18 | Wt 116.0 lb

## 2016-09-10 VITALS — BP 131/63 | HR 91 | Temp 98.3°F | Resp 18

## 2016-09-10 DIAGNOSIS — Z5111 Encounter for antineoplastic chemotherapy: Secondary | ICD-10-CM

## 2016-09-10 DIAGNOSIS — C8335 Diffuse large B-cell lymphoma, lymph nodes of inguinal region and lower limb: Secondary | ICD-10-CM

## 2016-09-10 DIAGNOSIS — C8338 Diffuse large B-cell lymphoma, lymph nodes of multiple sites: Secondary | ICD-10-CM

## 2016-09-10 DIAGNOSIS — Z5112 Encounter for antineoplastic immunotherapy: Secondary | ICD-10-CM | POA: Diagnosis not present

## 2016-09-10 DIAGNOSIS — Z5189 Encounter for other specified aftercare: Secondary | ICD-10-CM

## 2016-09-10 DIAGNOSIS — C8330 Diffuse large B-cell lymphoma, unspecified site: Secondary | ICD-10-CM

## 2016-09-10 DIAGNOSIS — R634 Abnormal weight loss: Secondary | ICD-10-CM

## 2016-09-10 MED ORDER — HEPARIN SOD (PORK) LOCK FLUSH 100 UNIT/ML IV SOLN
500.0000 [IU] | Freq: Once | INTRAVENOUS | Status: AC | PRN
  Administered 2016-09-10: 500 [IU]
  Filled 2016-09-10: qty 5

## 2016-09-10 MED ORDER — DEXAMETHASONE SODIUM PHOSPHATE 10 MG/ML IJ SOLN
10.0000 mg | Freq: Once | INTRAMUSCULAR | Status: AC
  Administered 2016-09-10: 10 mg via INTRAVENOUS

## 2016-09-10 MED ORDER — VINCRISTINE SULFATE CHEMO INJECTION 1 MG/ML
2.0000 mg | Freq: Once | INTRAVENOUS | Status: AC
  Administered 2016-09-10: 2 mg via INTRAVENOUS
  Filled 2016-09-10: qty 2

## 2016-09-10 MED ORDER — SODIUM CHLORIDE 0.9 % IV SOLN
Freq: Once | INTRAVENOUS | Status: AC
  Administered 2016-09-10: 09:00:00 via INTRAVENOUS

## 2016-09-10 MED ORDER — SODIUM CHLORIDE 0.9 % IV SOLN
375.0000 mg/m2 | Freq: Once | INTRAVENOUS | Status: AC
  Administered 2016-09-10: 600 mg via INTRAVENOUS
  Filled 2016-09-10: qty 50

## 2016-09-10 MED ORDER — ACETAMINOPHEN 325 MG PO TABS
650.0000 mg | ORAL_TABLET | Freq: Once | ORAL | Status: AC
  Administered 2016-09-10: 650 mg via ORAL
  Filled 2016-09-10: qty 2

## 2016-09-10 MED ORDER — DEXAMETHASONE SODIUM PHOSPHATE 10 MG/ML IJ SOLN
INTRAMUSCULAR | Status: AC
  Filled 2016-09-10: qty 1

## 2016-09-10 MED ORDER — PEGFILGRASTIM 6 MG/0.6ML ~~LOC~~ PSKT
6.0000 mg | PREFILLED_SYRINGE | Freq: Once | SUBCUTANEOUS | Status: AC
  Administered 2016-09-10: 6 mg via SUBCUTANEOUS

## 2016-09-10 MED ORDER — SODIUM CHLORIDE 0.9% FLUSH
10.0000 mL | INTRAVENOUS | Status: DC | PRN
  Administered 2016-09-10: 10 mL
  Filled 2016-09-10: qty 10

## 2016-09-10 MED ORDER — DOXORUBICIN HCL CHEMO IV INJECTION 2 MG/ML
50.0000 mg/m2 | Freq: Once | INTRAVENOUS | Status: AC
  Administered 2016-09-10: 78 mg via INTRAVENOUS
  Filled 2016-09-10: qty 39

## 2016-09-10 MED ORDER — DIPHENHYDRAMINE HCL 25 MG PO CAPS
50.0000 mg | ORAL_CAPSULE | Freq: Once | ORAL | Status: AC
  Administered 2016-09-10: 50 mg via ORAL
  Filled 2016-09-10: qty 2

## 2016-09-10 MED ORDER — PEGFILGRASTIM 6 MG/0.6ML ~~LOC~~ PSKT
PREFILLED_SYRINGE | SUBCUTANEOUS | Status: AC
  Filled 2016-09-10: qty 0.6

## 2016-09-10 MED ORDER — SODIUM CHLORIDE 0.9 % IV SOLN
750.0000 mg/m2 | Freq: Once | INTRAVENOUS | Status: AC
  Administered 2016-09-10: 1180 mg via INTRAVENOUS
  Filled 2016-09-10: qty 50

## 2016-09-10 MED ORDER — SODIUM CHLORIDE 0.9 % IV SOLN: 10.0000 mg | Freq: Once | INTRAVENOUS | Status: DC

## 2016-09-10 MED ORDER — PALONOSETRON HCL INJECTION 0.25 MG/5ML
0.2500 mg | Freq: Once | INTRAVENOUS | Status: AC
  Administered 2016-09-10: 0.25 mg via INTRAVENOUS
  Filled 2016-09-10: qty 5

## 2016-09-10 NOTE — Progress Notes (Signed)
Gabriella Love tolerated chemo tx with Neulasta on-pro well without complaints or incident. Labs reviewed with PA prior to administering chemotherapy. VSS upon discharge. Neulasta on-pro applied to pt's arm with green indicator light flashing.Pt discharged self ambulatory in satisfactory condition accompanied by her husband

## 2016-09-10 NOTE — Patient Instructions (Addendum)
[facility] at [facility] Discharge Instructions  RECOMMENDATIONS MADE BY THE CONSULTANT AND ANY TEST RESULTS WILL BE SENT TO YOUR REFERRING PHYSICIAN.  You were seen today by Kirby Crigler PA-C. Vanicream for dry skin, Similasan for dry eye relief. PET scan as scheduled. Return in 3 weeks for follow up and review of PET scan.   Thank you for choosing [place] at [facility] to provide your oncology and hematology care.  To afford each patient quality time with our provider, please arrive at least 15 minutes before your scheduled appointment time.    If you have a lab appointment with the [facility] please come in thru the  Main Entrance and check in at the main information desk  You need to re-schedule your appointment should you arrive 10 or more minutes late.  We strive to give you quality time with our providers, and arriving late affects you and other patients whose appointments are after yours.  Also, if you no show three or more times for appointments you may be dismissed from the clinic at the providers discretion.     Again, thank you for choosing [facility].  Our hope is that these requests will decrease the amount of time that you wait before being seen by our physicians.       _____________________________________________________________  Should you have questions after your visit to [facility], please contact our office at [phone] between the hours of 8:30 a.m. and 4:30 p.m.  Voicemails left after 4:30 p.m. will not be returned until the following business day.  For prescription refill requests, have your pharmacy contact our office.       Resources For Cancer Patients and their Caregivers ? American Cancer Society: Can assist with transportation, wigs, general needs, runs Look Good Feel Better.        539-119-0671 ? Cancer Care: Provides financial assistance, online support groups,  medication/co-pay assistance.  1-800-813-HOPE 937 656 0965) ? [place] Assists [place] cancer patients and their families through emotional , educational and financial support.  3076675406 ? [place] DSS Where to apply for food stamps, Medicaid and utility assistance. 623-298-0112 ? RCATS: Transportation to medical appointments. 936-652-6325 ? Social Security Administration: May apply for disability if have a Stage IV cancer. 313-339-3263 ([phone] ? [employer], Disability and Transit Services: Assists with nutrition, care and transit needs. [facility] Support Programs: @10RELATIVEDAYS @ > Cancer Support Group  2nd Tuesday of the month 1pm-2pm, Journey Room  > Creative Journey  3rd Tuesday of the month 1130am-1pm, Journey Room  > Look Good Feel Better  1st Wednesday of the month 10am-12 noon, Journey Room (Call [place] to register [phone])

## 2016-09-10 NOTE — Progress Notes (Signed)
Gabriella Neighbors, MD [place] [address]  Diffuse large B-cell lymphoma, unspecified body region [facility])  Weight loss - Plan: Amb Referral to Nutrition and Diabetic E  CURRENT THERAPY: R-CHOP beginning on 07/30/2016  INTERVAL HISTORY: Gabriella Love 61 y.o. female returns for followup of Stage III DLBCL, R-CHOP beginning on 07/30/2016.    DLBCL (diffuse large B cell lymphoma) ([place])   07/13/2016 Initial Biopsy    L inguinal biopsy with Dr. 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 Grade (if applicable): High grade. Flow cytometry: [id number] reveals a monoclonal B-cell population with expression of CD10. Immunohistochemical stains: CD20, CD3, CD10, bcl-2, bcl-6, CD21, CD23, Ki-67. Touch preps/imprints: Mixture of large and smaller lymphocytes with irregular nuclear contours. Comments: Sections of lymph node reveal effacement of the architecture by a nodular proliferation of neoplastic appearing follicles. In many of the areas the follicles coalesce and form sheets of large cells. The more formed follicles predominately consist of numerous large centroblasts (>15 per hpf), representing a high grade (3B) follicular component. The diffuse areas are also composed of large cells but lack follicular dendritic networks (CD21, CD23). Immunohistochemistry reveals the atypical lymphocytes are positive for CD20, CD10, and bcl-6. They are negative for bcl-2. Ki-67 is elevated (30% overall). CD3 reveals residual surrounding T-cells. Flow cytometry ([id number]) reveals a monoclonal B-cell population with expression of CD10. 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 The case was called to Dr. Rosana Hoes  on 07/16/2016.      07/23/2016 Echocardiogram    Left ventricle: Systolic function was normal. The estimated ejection fraction was in the range of 50% to 55%. Doppler parameters are consistent with abnormal left ventricular relaxation (grade 1 diastolic dysfunction).      07/24/2016 Procedure    Technically successful CT guided right iliac bone core and aspiration biopsy by IR.      07/28/2016 PET scan    1. Hypermetabolic lymphadenopathy in the neck, abdomen and right inguinal area as described above, consistent with known lymphoma. 2. Focal area of hypermetabolism in the appendix could be lymphomatous involvement. Recommend attention on follow-up scans. 3. Age advanced atherosclerotic calcifications involving the abdominal aorta and branch vessels and age advanced three-vessel coronary artery calcifications. 4. No obvious osseous involvement. 5. Emphysematous changes and pulmonary scarring.      07/28/2016 Pathology Results    Bone Marrow, Aspirate,Biopsy, and Clot, right iliac - NORMOCELLULAR BONE MARROW WITH TRILINEAGE HEMATOPOIESIS. PERIPHERAL BLOOD: - OCCASIONAL CIRCULATING ATYPICAL LYMPHOID CELLS.      07/28/2016 Pathology Results    Bone Marrow Flow Cytometry - NO MONOCLONAL B CELL POPULATION OR ABNORMAL T CELL PHENOTYPE IDENTIFIED.      07/30/2016 Pathology Results    Peripheral Blood Flow Cytometry - NO MONOCLONAL B-CELL POPULATION OR ABNORMAL T-CELL PHENOTYPE IDENTIFIED.      07/30/2016 -  Chemotherapy    The patient had DOXOrubicin (ADRIAMYCIN) chemo injection 78 mg, 50 mg/m2 = 78 mg, Intravenous,  Once, 2 of 6 cycles  palonosetron (ALOXI) injection 0.25 mg, 0.25 mg, Intravenous,  Once, 2 of 6 cycles  pegfilgrastim (NEULASTA ONPRO KIT) injection 6 mg, 6 mg, Subcutaneous, Once, 2 of 6 cycles  vinCRIStine (ONCOVIN) 2 mg in sodium chloride 0.9 %  50 mL chemo infusion, 2 mg, Intravenous,  Once, 2 of 6 cycles  riTUXimab (RITUXAN) 600 mg in sodium chloride 0.9 %  250 mL (1.9355 mg/mL) chemo infusion, 375 mg/m2 = 600 mg, Intravenous,  Once, 2 of 6 cycles  cyclophosphamide (CYTOXAN) 1,180 mg in sodium chloride 0.9 % 250 mL chemo infusion, 750 mg/m2 = 1,180 mg, Intravenous,  Once, 2 of 6 cycles  for chemotherapy treatment.         She continues to tolerate treatment well.  She denies any treatment specific complaints.  She denies any nausea or vomiting.  Her appetite is fair.  I have noted a little bit of weight loss.  She reports dry skin.  She is utilizing moisturizing lotion at home.  I recommended another moisturizing cream that can be used which I think is more effective.  She also notes dry eyes.  She reports some watering of her eyes and itching of her eyes.  I provided her some recommendations for over-the-counter preparations for this.  She will follow-up with her ophthalmologist if this gets worse.  I reviewed her treatment plan with her.  Review of Systems  Constitutional: Positive for weight loss. Negative for chills and fever.  HENT: Negative.   Eyes: Positive for blurred vision (intermittent) and discharge (watering).  Respiratory: Negative.  Negative for cough and sputum production.   Cardiovascular: Negative.  Negative for chest pain.  Gastrointestinal: Negative.  Negative for constipation, diarrhea, nausea and vomiting.  Genitourinary: Negative.  Negative for dysuria.  Musculoskeletal: Negative.   Skin: Positive for itching. Negative for rash.       Dry skin  Neurological: Negative.  Negative for weakness.  Endo/Heme/Allergies: Negative.   Psychiatric/Behavioral: Negative.     Past Medical History:  Diagnosis Date  . Anxiety   . Arthritis   . DLBCL (diffuse large B cell lymphoma) ([place]) 07/16/2016  . GERD (gastroesophageal reflux disease)   . Heart disease   . Hyperlipidemia   . Hypertension   . Myocardial infarction 12/19/2009   released from cardiology    Past Surgical History:  Procedure Laterality Date  .  CARDIAC CATHETERIZATION     cardiac stent  . CHOLECYSTECTOMY    . CORONARY STENT PLACEMENT  2011  . INGUINAL LYMPH NODE BIOPSY Left 07/13/2016   Procedure: EXCISIONAL BIOPSY OF LEFT INGUINAL LYMPH NODE;  Surgeon: Vickie Epley, MD;  Location: [facility];  Service: General;  Laterality: Left;  . PORTACATH PLACEMENT N/A 07/17/2016   Procedure: INSERTION OF TUNNELED CENTRAL VENOUS CATHETER WITH SUBCUTANEOUS PORT;  Surgeon: Vickie Epley, MD;  Location: [facility];  Service: Vascular;  Laterality: N/A;  . TUBAL LIGATION      Family History  Problem Relation Age of Onset  . Cancer Mother     colon  . Depression Mother   . Heart disease Father   . Depression Sister   . Cancer Sister     leukemia  . Hyperlipidemia Brother   . Hypertension Brother     Social History   Social History  . Marital status: Single    Spouse name: N/A  . Number of children: N/A  . Years of education: N/A   Social History Main Topics  . Smoking status: Current Every Day Smoker    Packs/day: 0.50    Years: 40.00    Types: Cigarettes  . Smokeless tobacco: Never Used  . Alcohol use No  . Drug use: No  . Sexual activity: Yes    Birth control/  protection: Post-menopausal   Other Topics Concern  . None   Social History Narrative  . None     PHYSICAL EXAMINATION  ECOG PERFORMANCE STATUS: 1 - Symptomatic but completely ambulatory  Vitals:   09/10/16 0851  BP: 121/63  Pulse: 88  Resp: 18  Temp: 98.1 F (36.7 C)    GENERAL:alert, no distress, well developed, comfortable, cooperative, smiling and accompanied by husband, in chemo-bed SKIN: Dry skin on scalp, upper extremities, and lower extremities. HEAD: Dry scalp, alopecia. EYES: normal, EOMI, Conjunctiva are pink and non-injected EARS: External ears normal OROPHARYNX:lips, buccal mucosa, and tongue normal and mucous membranes are moist  NECK: supple, no adenopathy, trachea midline LYMPH:  no palpable lymphadenopathy BREAST:not  examined LUNGS: clear to auscultation and percussion HEART: regular rate & rhythm ABDOMEN:abdomen soft, non-tender and normal bowel sounds BACK: Back symmetric, no curvature. EXTREMITIES:less then 2 second capillary refill, no joint deformities, effusion, or inflammation, no skin discoloration, no cyanosis  NEURO: alert & oriented x 3 with fluent speech, no focal motor/sensory deficits, gait normal   LABORATORY DATA: CBC    Component Value Date/Time   WBC 8.2 09/09/2016 1306   RBC 4.22 09/09/2016 1306   HGB 13.5 09/09/2016 1306   HCT 40.3 09/09/2016 1306   PLT 349 09/09/2016 1306   MCV 95.5 09/09/2016 1306   MCH 32.0 09/09/2016 1306   MCHC 33.5 09/09/2016 1306   RDW 16.1 (H) 09/09/2016 1306   LYMPHSABS 0.9 09/09/2016 1306   MONOABS 0.8 09/09/2016 1306   EOSABS 0.0 09/09/2016 1306   BASOSABS 0.0 09/09/2016 1306      Chemistry      Component Value Date/Time   NA 138 09/09/2016 1306   K 4.1 09/09/2016 1306   CL 102 09/09/2016 1306   CO2 27 09/09/2016 1306   BUN 12 09/09/2016 1306   CREATININE 0.78 09/09/2016 1306      Component Value Date/Time   CALCIUM 9.7 09/09/2016 1306   ALKPHOS 67 09/09/2016 1306   AST 20 09/09/2016 1306   ALT 23 09/09/2016 1306   BILITOT 0.8 09/09/2016 1306     Lab Results  Component Value Date   CALCIUM 9.7 09/09/2016   PHOS 4.0 09/09/2016    Lab Results  Component Value Date   LABURIC 2.4 09/09/2016     PENDING LABS:   RADIOGRAPHIC STUDIES:  No results found.   PATHOLOGY:    ASSESSMENT AND PLAN:  DLBCL (diffuse large B cell lymphoma) ([facility]) Stage III DLBCL, R-CHOP beginning on 07/30/2016.  Oncology history updated.  Pre-treatment labs performed yesterday: CBC diff, CMET, uric acid, phosphorus.  I personally reviewed and went over laboratory results with the patient.  The results are noted within this dictation.  Labs satisfy treatment parameters.  Weight is relatively stable.  Since starting treatment, she has lost a  little bit of weight.  I will consult RD.  She reports dry skin.  I recommended Vanicream.  I have called [employer] and they will order this in stock on the shelves.  She reports dry eyes.  I have recommended Similasan Dry Eye Relief.  If this gets worse, we will get her to see her ophthalmologist (Dr. Jorja Loa).  Restaging PET following cycle #3 is scheduled for 09/23/2016.  Return as scheduled for follow-up and cycle #4 of treatment.  At that time, we will discuss PET imaging results.   ORDERS PLACED FOR THIS ENCOUNTER: Orders Placed This Encounter  Procedures  . Amb Referral to Nutrition and Diabetic E  MEDICATIONS PRESCRIBED THIS ENCOUNTER: No orders of the defined types were placed in this encounter.   THERAPY PLAN:  R-CHOP x 6 cycles.  Restaging PET following cycle #3.  All questions were answered. The patient knows to call the clinic with any problems, questions or concerns. We can certainly see the patient much sooner if necessary.  Patient and plan discussed with Dr. Twana First and she is in agreement with the aforementioned.   This note is electronically signed by: Doy Mince 09/10/2016 9:45 AM

## 2016-09-10 NOTE — Assessment & Plan Note (Addendum)
Stage III DLBCL, R-CHOP beginning on 07/30/2016.  Oncology history updated.  Pre-treatment labs performed yesterday: CBC diff, CMET, uric acid, phosphorus.  I personally reviewed and went over laboratory results with the patient.  The results are noted within this dictation.  Labs satisfy treatment parameters.  Weight is relatively stable.  Since starting treatment, she has lost a little bit of weight.  I will consult RD.  She reports dry skin.  I recommended Vanicream.  I have called [employer] and they will order this in stock on the shelves.  She reports dry eyes.  I have recommended Similasan Dry Eye Relief.  If this gets worse, we will get her to see her ophthalmologist (Dr. Jorja Loa).  Restaging PET following cycle #3 is scheduled for 09/23/2016.  Return as scheduled for follow-up and cycle #4 of treatment.  At that time, we will discuss PET imaging results.

## 2016-09-10 NOTE — Patient Instructions (Addendum)
[facility] Discharge Instructions for Patients Receiving Chemotherapy   Beginning January 23rd 2017 lab work for the [facility] will be done in the  Main lab at [facility] on 1st floor. If you have a lab appointment with the [facility] please come in thru the  Main Entrance and check in at the main information desk   Today you received the following chemotherapy agents Rituxan,Adraimycin,Cytoxan and Vincristine as well as Neulasta on-pro. Follow-up as scheduled. Call clinic for any questions or concerns  To help prevent nausea and vomiting after your treatment, we encourage you to take your nausea medication   If you develop nausea and vomiting, or diarrhea that is not controlled by your medication, call the clinic.  The clinic phone number is [phone]. Office hours are Monday-Friday 8:30am-5:00pm.  BELOW ARE SYMPTOMS THAT SHOULD BE REPORTED IMMEDIATELY:  *FEVER GREATER THAN 101.0 F  *CHILLS WITH OR WITHOUT FEVER  NAUSEA AND VOMITING THAT IS NOT CONTROLLED WITH YOUR NAUSEA MEDICATION  *UNUSUAL SHORTNESS OF BREATH  *UNUSUAL BRUISING OR BLEEDING  TENDERNESS IN MOUTH AND THROAT WITH OR WITHOUT PRESENCE OF ULCERS  *URINARY PROBLEMS  *BOWEL PROBLEMS  UNUSUAL RASH Items with * indicate a potential emergency and should be followed up as soon as possible. If you have an emergency after office hours please contact your primary care physician or go to the nearest emergency department.  Please call the clinic during office hours if you have any questions or concerns.   You may also contact the Patient Navigator at [phone] should you have any questions or need assistance in obtaining follow up care.      Resources For Cancer Patients and their Caregivers ? American Cancer Society: Can assist with transportation, wigs, general needs, runs Look Good Feel Better.        343-721-3717 ? Cancer Care: Provides financial assistance, online support  groups, medication/co-pay assistance.  1-800-813-HOPE 847 804 3605) ? [place] Assists [place] cancer patients and their families through emotional , educational and financial support.  5025592291 ? [place] DSS Where to apply for food stamps, Medicaid and utility assistance. 365-715-2996 ? RCATS: Transportation to medical appointments. [phone] ? Social Security Administration: May apply for disability if have a Stage IV cancer. [phone] ? [employer], Disability and Transit Services: Assists with nutrition, care and transit needs. [phone]

## 2016-09-18 ENCOUNTER — Encounter (HOSPITAL_COMMUNITY): Payer: Self-pay

## 2016-09-18 NOTE — Progress Notes (Signed)
Nutrition  Patient cancelled nutrition appointment for today.  Reakwon Barren B. Emmanuela Ghazi, RD, LDN Registered Dietitian [phone] (pager)   

## 2016-09-23 ENCOUNTER — Encounter (HOSPITAL_COMMUNITY)
Admission: RE | Admit: 2016-09-23 | Discharge: 2016-09-23 | Disposition: A | Discharge status: Home / Self Care | Payer: Commercial Managed Care - HMO | Source: Ambulatory Visit | Attending: Oncology | Admitting: Oncology

## 2016-09-23 DIAGNOSIS — C8338 Diffuse large B-cell lymphoma, lymph nodes of multiple sites: Secondary | ICD-10-CM | POA: Insufficient documentation

## 2016-09-23 LAB — GLUCOSE, CAPILLARY: Glucose-Capillary: 113 mg/dL — ABNORMAL HIGH (ref 65–99)

## 2016-09-23 MED ORDER — FLUDEOXYGLUCOSE F - 18 (FDG) INJECTION: 5.8000 | Freq: Once | INTRAVENOUS | Status: DC | PRN

## 2016-09-30 ENCOUNTER — Other Ambulatory Visit (HOSPITAL_COMMUNITY): Payer: Self-pay | Admitting: Oncology

## 2016-09-30 ENCOUNTER — Encounter (HOSPITAL_COMMUNITY): Payer: Self-pay | Attending: Oncology

## 2016-09-30 DIAGNOSIS — C8330 Diffuse large B-cell lymphoma, unspecified site: Secondary | ICD-10-CM

## 2016-09-30 DIAGNOSIS — C8338 Diffuse large B-cell lymphoma, lymph nodes of multiple sites: Secondary | ICD-10-CM | POA: Insufficient documentation

## 2016-09-30 DIAGNOSIS — E876 Hypokalemia: Secondary | ICD-10-CM

## 2016-09-30 LAB — COMPREHENSIVE METABOLIC PANEL
ALK PHOS: 62 U/L (ref 38–126)
ALT: 16 U/L (ref 14–54)
ANION GAP: 9 (ref 5–15)
AST: 17 U/L (ref 15–41)
Albumin: 4.2 g/dL (ref 3.5–5.0)
BILIRUBIN TOTAL: 0.5 mg/dL (ref 0.3–1.2)
BUN: 11 mg/dL (ref 6–20)
CALCIUM: 9.5 mg/dL (ref 8.9–10.3)
CO2: 28 mmol/L (ref 22–32)
Chloride: 103 mmol/L (ref 101–111)
Creatinine, Ser: 0.73 mg/dL (ref 0.44–1.00)
Glucose, Bld: 120 mg/dL — ABNORMAL HIGH (ref 65–99)
Potassium: 3.3 mmol/L — ABNORMAL LOW (ref 3.5–5.1)
Sodium: 140 mmol/L (ref 135–145)
TOTAL PROTEIN: 6.7 g/dL (ref 6.5–8.1)

## 2016-09-30 LAB — CBC WITH DIFFERENTIAL/PLATELET
BASOS PCT: 0 %
Basophils Absolute: 0 10*3/uL (ref 0.0–0.1)
Eosinophils Absolute: 0.1 10*3/uL (ref 0.0–0.7)
Eosinophils Relative: 0 %
HEMATOCRIT: 39.2 % (ref 36.0–46.0)
Hemoglobin: 13.2 g/dL (ref 12.0–15.0)
LYMPHS ABS: 0.6 10*3/uL — AB (ref 0.7–4.0)
LYMPHS PCT: 5 %
MCH: 32.8 pg (ref 26.0–34.0)
MCHC: 33.7 g/dL (ref 30.0–36.0)
MCV: 97.3 fL (ref 78.0–100.0)
MONO ABS: 1.1 10*3/uL — AB (ref 0.1–1.0)
MONOS PCT: 8 %
Neutro Abs: 12 10*3/uL — ABNORMAL HIGH (ref 1.7–7.7)
Neutrophils Relative %: 87 %
Platelets: 358 10*3/uL (ref 150–400)
RBC: 4.03 MIL/uL (ref 3.87–5.11)
RDW: 17.4 % — AB (ref 11.5–15.5)
WBC: 13.8 10*3/uL — ABNORMAL HIGH (ref 4.0–10.5)

## 2016-09-30 LAB — PHOSPHORUS: Phosphorus: 3.9 mg/dL (ref 2.5–4.6)

## 2016-09-30 LAB — URIC ACID: URIC ACID, SERUM: 2 mg/dL — AB (ref 2.3–6.6)

## 2016-09-30 MED ORDER — POTASSIUM CHLORIDE CRYS ER 20 MEQ PO TBCR: 40.0000 meq | EXTENDED_RELEASE_TABLET | Freq: Every day | ORAL | 0 refills | Status: DC

## 2016-10-01 ENCOUNTER — Ambulatory Visit (HOSPITAL_COMMUNITY): Payer: Self-pay | Admitting: Hematology & Oncology

## 2016-10-01 ENCOUNTER — Encounter (HOSPITAL_BASED_OUTPATIENT_CLINIC_OR_DEPARTMENT_OTHER): Payer: Self-pay

## 2016-10-01 ENCOUNTER — Encounter (HOSPITAL_COMMUNITY): Payer: Self-pay

## 2016-10-01 ENCOUNTER — Encounter (HOSPITAL_BASED_OUTPATIENT_CLINIC_OR_DEPARTMENT_OTHER): Payer: Self-pay | Admitting: Oncology

## 2016-10-01 VITALS — BP 109/59 | HR 88 | Temp 98.6°F | Resp 18 | Wt 110.0 lb

## 2016-10-01 DIAGNOSIS — C8338 Diffuse large B-cell lymphoma, lymph nodes of multiple sites: Secondary | ICD-10-CM

## 2016-10-01 DIAGNOSIS — E876 Hypokalemia: Secondary | ICD-10-CM

## 2016-10-01 DIAGNOSIS — C8335 Diffuse large B-cell lymphoma, lymph nodes of inguinal region and lower limb: Secondary | ICD-10-CM

## 2016-10-01 DIAGNOSIS — C8330 Diffuse large B-cell lymphoma, unspecified site: Secondary | ICD-10-CM

## 2016-10-01 DIAGNOSIS — Z5111 Encounter for antineoplastic chemotherapy: Secondary | ICD-10-CM

## 2016-10-01 DIAGNOSIS — Z5112 Encounter for antineoplastic immunotherapy: Secondary | ICD-10-CM

## 2016-10-01 MED ORDER — PALONOSETRON HCL INJECTION 0.25 MG/5ML
0.2500 mg | Freq: Once | INTRAVENOUS | Status: AC
  Administered 2016-10-01: 0.25 mg via INTRAVENOUS

## 2016-10-01 MED ORDER — SODIUM CHLORIDE 0.9 % IV SOLN
375.0000 mg/m2 | Freq: Once | INTRAVENOUS | Status: AC
  Administered 2016-10-01: 600 mg via INTRAVENOUS
  Filled 2016-10-01: qty 50

## 2016-10-01 MED ORDER — DOXORUBICIN HCL CHEMO IV INJECTION 2 MG/ML
50.0000 mg/m2 | Freq: Once | INTRAVENOUS | Status: AC
  Administered 2016-10-01: 78 mg via INTRAVENOUS
  Filled 2016-10-01: qty 39

## 2016-10-01 MED ORDER — DIPHENHYDRAMINE HCL 25 MG PO CAPS
ORAL_CAPSULE | ORAL | Status: AC
  Filled 2016-10-01: qty 2

## 2016-10-01 MED ORDER — VINCRISTINE SULFATE CHEMO INJECTION 1 MG/ML
2.0000 mg | Freq: Once | INTRAVENOUS | Status: AC
  Administered 2016-10-01: 2 mg via INTRAVENOUS
  Filled 2016-10-01: qty 2

## 2016-10-01 MED ORDER — HEPARIN SOD (PORK) LOCK FLUSH 100 UNIT/ML IV SOLN
500.0000 [IU] | Freq: Once | INTRAVENOUS | Status: AC | PRN
Start: 2016-10-01 — End: 2016-10-01
  Administered 2016-10-01: 500 [IU]
  Filled 2016-10-01: qty 5

## 2016-10-01 MED ORDER — POTASSIUM CHLORIDE CRYS ER 20 MEQ PO TBCR
40.0000 meq | EXTENDED_RELEASE_TABLET | Freq: Once | ORAL | Status: AC
  Administered 2016-10-01: 40 meq via ORAL
  Filled 2016-10-01: qty 2

## 2016-10-01 MED ORDER — ACETAMINOPHEN 325 MG PO TABS
ORAL_TABLET | ORAL | Status: AC
  Filled 2016-10-01: qty 1

## 2016-10-01 MED ORDER — SODIUM CHLORIDE 0.9% FLUSH
10.0000 mL | INTRAVENOUS | Status: DC | PRN
  Administered 2016-10-01: 10 mL
  Filled 2016-10-01: qty 10

## 2016-10-01 MED ORDER — SODIUM CHLORIDE 0.9 % IV SOLN
Freq: Once | INTRAVENOUS | Status: AC
  Administered 2016-10-01: 10:00:00 via INTRAVENOUS

## 2016-10-01 MED ORDER — DEXAMETHASONE SODIUM PHOSPHATE 10 MG/ML IJ SOLN
INTRAMUSCULAR | Status: AC
  Filled 2016-10-01: qty 1

## 2016-10-01 MED ORDER — DEXAMETHASONE SODIUM PHOSPHATE 10 MG/ML IJ SOLN
10.0000 mg | Freq: Once | INTRAMUSCULAR | Status: AC
  Administered 2016-10-01: 10 mg via INTRAVENOUS

## 2016-10-01 MED ORDER — SODIUM CHLORIDE 0.9 % IV SOLN
750.0000 mg/m2 | Freq: Once | INTRAVENOUS | Status: AC
  Administered 2016-10-01: 1180 mg via INTRAVENOUS
  Filled 2016-10-01: qty 50

## 2016-10-01 MED ORDER — ACETAMINOPHEN 325 MG PO TABS
ORAL_TABLET | ORAL | Status: AC
  Filled 2016-10-01: qty 2

## 2016-10-01 MED ORDER — SODIUM CHLORIDE 0.9 % IV SOLN: 10.0000 mg | Freq: Once | INTRAVENOUS | Status: DC

## 2016-10-01 MED ORDER — PEGFILGRASTIM 6 MG/0.6ML ~~LOC~~ PSKT
6.0000 mg | PREFILLED_SYRINGE | Freq: Once | SUBCUTANEOUS | Status: AC
  Administered 2016-10-01: 6 mg via SUBCUTANEOUS
  Filled 2016-10-01: qty 0.6

## 2016-10-01 MED ORDER — ACETAMINOPHEN 325 MG PO TABS
650.0000 mg | ORAL_TABLET | Freq: Once | ORAL | Status: AC
  Administered 2016-10-01: 650 mg via ORAL

## 2016-10-01 MED ORDER — PALONOSETRON HCL INJECTION 0.25 MG/5ML
INTRAVENOUS | Status: AC
  Filled 2016-10-01: qty 5

## 2016-10-01 MED ORDER — DIPHENHYDRAMINE HCL 25 MG PO CAPS
50.0000 mg | ORAL_CAPSULE | Freq: Once | ORAL | Status: AC
  Administered 2016-10-01: 50 mg via ORAL

## 2016-10-01 NOTE — Progress Notes (Signed)
Gabriella Neighbors, MD [place] [address]  Diffuse large B-cell lymphoma, unspecified body region G.V. (Gabriella Love) [facility])  Progress Note  CURRENT THERAPY: R-CHOP beginning on 07/30/2016  INTERVAL HISTORY: Gabriella Love 61 y.o. female returns for followup of Stage III DLBCL, R-CHOP beginning on 07/30/2016.    DLBCL (diffuse large B cell lymphoma) ([place])   07/13/2016 Initial Biopsy    L inguinal biopsy with Dr. 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 Grade (if applicable): High grade. Flow cytometry: [id number] reveals a monoclonal B-cell population with expression of CD10. Immunohistochemical stains: CD20, CD3, CD10, bcl-2, bcl-6, CD21, CD23, Ki-67. Touch preps/imprints: Mixture of large and smaller lymphocytes with irregular nuclear contours. Comments: Sections of lymph node reveal effacement of the architecture by a nodular proliferation of neoplastic appearing follicles. In many of the areas the follicles coalesce and form sheets of large cells. The more formed follicles predominately consist of numerous large centroblasts (>15 per hpf), representing a high grade (3B) follicular component. The diffuse areas are also composed of large cells but lack follicular dendritic networks (CD21, CD23). Immunohistochemistry reveals the atypical lymphocytes are positive for CD20, CD10, and bcl-6. They are negative for bcl-2. Ki-67 is elevated (30% overall). CD3 reveals residual surrounding T-cells. Flow cytometry ([id number]) reveals a monoclonal B-cell population with expression of CD10. 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 The case was called to Dr. Rosana Love on 07/16/2016.      07/23/2016 Echocardiogram     Left ventricle: Systolic function was normal. The estimated ejection fraction was in the range of 50% to 55%. Doppler parameters are consistent with abnormal left ventricular relaxation (grade 1 diastolic dysfunction).      07/24/2016 Procedure    Technically successful CT guided right iliac bone core and aspiration biopsy by IR.      07/28/2016 PET scan    1. Hypermetabolic lymphadenopathy in the neck, abdomen and right inguinal area as described above, consistent with known lymphoma. 2. Focal area of hypermetabolism in the appendix could be lymphomatous involvement. Recommend attention on follow-up scans. 3. Age advanced atherosclerotic calcifications involving the abdominal aorta and branch vessels and age advanced three-vessel coronary artery calcifications. 4. No obvious osseous involvement. 5. Emphysematous changes and pulmonary scarring.      07/28/2016 Pathology Results    Bone Marrow, Aspirate,Biopsy, and Clot, right iliac - NORMOCELLULAR BONE MARROW WITH TRILINEAGE HEMATOPOIESIS. PERIPHERAL BLOOD: - OCCASIONAL CIRCULATING ATYPICAL LYMPHOID CELLS.      07/28/2016 Pathology Results    Bone Marrow Flow Cytometry - NO MONOCLONAL B CELL POPULATION OR ABNORMAL T CELL PHENOTYPE IDENTIFIED.      07/30/2016 Pathology Results    Peripheral Blood Flow Cytometry - NO MONOCLONAL B-CELL POPULATION OR ABNORMAL T-CELL PHENOTYPE IDENTIFIED.      07/30/2016 -  Chemotherapy    The patient had DOXOrubicin (ADRIAMYCIN) chemo injection 78 mg, 50 mg/m2 = 78 mg, Intravenous,  Once, 2 of 6 cycles  palonosetron (ALOXI) injection 0.25 mg, 0.25 mg, Intravenous,  Once, 2 of 6 cycles  pegfilgrastim (NEULASTA ONPRO KIT) injection 6 mg, 6 mg, Subcutaneous, Once, 2 of 6 cycles  vinCRIStine (ONCOVIN) 2 mg in sodium chloride 0.9 % 50 mL chemo infusion, 2 mg, Intravenous,  Once,  2 of 6 cycles  riTUXimab (RITUXAN) 600 mg in sodium chloride 0.9 % 250 mL (1.9355 mg/mL) chemo infusion, 375 mg/m2 =  600 mg, Intravenous,  Once, 2 of 6 cycles  cyclophosphamide (CYTOXAN) 1,180 mg in sodium chloride 0.9 % 250 mL chemo infusion, 750 mg/m2 = 1,180 mg, Intravenous,  Once, 2 of 6 cycles  for chemotherapy treatment.        08/03/2016 Pathology Results    Cytogenetic lab results [facility]). Cytogenetic Analysis: Normal.       09/23/2016 PET scan    1. Significantly improved appearance, with resolution of prior hypermetabolic activity in all of the previously involved lymph nodes. All lymph nodes are currently within normal size limits. 2. Other imaging findings of potential clinical significance: Chronic right sphenoid sinusitis. Right mastoid effusion. Coronary, aortic arch, and branch vessel atherosclerotic vascular disease. Severe emphysema. Aortoiliac atherosclerotic vascular disease.       Gabriella Love presents to the clinic today accompanied by her husband and daughter for cycle 4 of R-CHOP.  I personally reviewed and went over scans with the patient.  She notes she is weaker this time after her chemotherapy compared to last time. She said this is the worst it has been. States she was very nauseous this past week and tried not to take her nausea medication as long as she could help it. She notes that certain textures get to her. "I can eat jello one day and be fine, but the next day it makes me gag."  She notes having bouts of diarrhea for last couple days and having yellow, watery stool. "it looked like stomach acid." She report that she got it under control yesterday and resolved completely.  She notes that she is still currently smoking but cutting down. "I have been smoking since I was 15." She smokes 1 pack every 3 days.   Patient has no other questions or concerns at this time.    Review of Systems  HENT: Negative.   Eyes: Negative.   Respiratory: Negative.   Cardiovascular: Negative.   Gastrointestinal: Positive for diarrhea (last couple of days, currently completely  resolved.) and nausea (per treatment, severe, takes her nausea medication for it.).  Genitourinary: Negative.   Musculoskeletal: Negative.   Skin: Negative.   Neurological: Positive for weakness (weaker this time compared to previous treatment cycles).  Endo/Heme/Allergies: Negative.   Psychiatric/Behavioral: Negative.   All other systems reviewed and are negative.   Past Medical History:  Diagnosis Date  . Anxiety   . Arthritis   . DLBCL (diffuse large B cell lymphoma) ([place]) 07/16/2016  . GERD (gastroesophageal reflux disease)   . Heart disease   . Hyperlipidemia   . Hypertension   . Myocardial infarction 12/19/2009   released from cardiology    Past Surgical History:  Procedure Laterality Date  . CARDIAC CATHETERIZATION     cardiac stent  . CHOLECYSTECTOMY    . CORONARY STENT PLACEMENT  2011  . INGUINAL LYMPH NODE BIOPSY Left 07/13/2016   Procedure: EXCISIONAL BIOPSY OF LEFT INGUINAL LYMPH NODE;  Surgeon: Vickie Epley, MD;  Location: [facility];  Service: General;  Laterality: Left;  . PORTACATH PLACEMENT N/A 07/17/2016   Procedure: INSERTION OF TUNNELED CENTRAL VENOUS CATHETER WITH SUBCUTANEOUS PORT;  Surgeon: Vickie Epley, MD;  Location: [facility];  Service: Vascular;  Laterality: N/A;  . TUBAL LIGATION      Family History  Problem Relation Age of Onset  . Cancer Mother     colon  .  Depression Mother   . Heart disease Father   . Depression Sister   . Cancer Sister     leukemia  . Hyperlipidemia Brother   . Hypertension Brother     Social History   Social History  . Marital status: Single    Spouse name: N/A  . Number of children: N/A  . Years of education: N/A   Social History Main Topics  . Smoking status: Current Every Day Smoker    Packs/day: 0.50    Years: 40.00    Types: Cigarettes  . Smokeless tobacco: Never Used  . Alcohol use No  . Drug use: No  . Sexual activity: Yes    Birth control/ protection: Post-menopausal   Other Topics Concern    . None   Social History Narrative  . None     PHYSICAL EXAMINATION  ECOG PERFORMANCE STATUS: 1 - Symptomatic but completely ambulatory    Vitals with BMI 10/01/2016  Height   Weight 110 lbs  BMI   Systolic 390  Diastolic 85  Pulse 300  Respirations 20    Physical Exam  Constitutional: She is oriented to person, place, and time and well-developed, well-nourished, and in no distress. No distress.  Physical exam was performed in a treatment bed.   HENT:  Head: Normocephalic and atraumatic.  Mouth/Throat: No oropharyngeal exudate.  Eyes: Conjunctivae and EOM are normal. Pupils are equal, round, and reactive to light. No scleral icterus.  Neck: Normal range of motion. Neck supple. No JVD present.  Cardiovascular: Normal rate, regular rhythm and normal heart sounds.  Exam reveals no gallop and no friction rub.   No murmur heard. Pulmonary/Chest: Effort normal and breath sounds normal. No respiratory distress. She has no wheezes. She has no rales.  Abdominal: Soft. Bowel sounds are normal. She exhibits no distension. There is no tenderness. There is no guarding.  Musculoskeletal: Normal range of motion. She exhibits no edema or tenderness.  Lymphadenopathy:    She has no cervical adenopathy.  Neurological: She is alert and oriented to person, place, and time. No cranial nerve deficit. Gait normal.  Skin: Skin is warm and dry. No rash noted. No erythema. No pallor.  Psychiatric: Affect and judgment normal.  Nursing note and vitals reviewed.    LABORATORY DATA: CBC    Component Value Date/Time   WBC 13.8 (H) 09/30/2016 1005   RBC 4.03 09/30/2016 1005   HGB 13.2 09/30/2016 1005   HCT 39.2 09/30/2016 1005   PLT 358 09/30/2016 1005   MCV 97.3 09/30/2016 1005   MCH 32.8 09/30/2016 1005   MCHC 33.7 09/30/2016 1005   RDW 17.4 (H) 09/30/2016 1005   LYMPHSABS 0.6 (L) 09/30/2016 1005   MONOABS 1.1 (H) 09/30/2016 1005   EOSABS 0.1 09/30/2016 1005   BASOSABS 0.0 09/30/2016  1005      Chemistry      Component Value Date/Time   NA 140 09/30/2016 1005   K 3.3 (L) 09/30/2016 1005   CL 103 09/30/2016 1005   CO2 28 09/30/2016 1005   BUN 11 09/30/2016 1005   CREATININE 0.73 09/30/2016 1005      Component Value Date/Time   CALCIUM 9.5 09/30/2016 1005   ALKPHOS 62 09/30/2016 1005   AST 17 09/30/2016 1005   ALT 16 09/30/2016 1005   BILITOT 0.5 09/30/2016 1005     Lab Results  Component Value Date   CALCIUM 9.5 09/30/2016   PHOS 3.9 09/30/2016    Lab Results  Component Value Date  LABURIC 2.0 (L) 09/30/2016     PENDING LABS:   RADIOGRAPHIC STUDIES: I have personally reviewed the radiological images as listed and agreed with the findings in the report.  NUCLEAR MEDICINE PET SKULL BASE TO THIGH 09/23/2016  IMPRESSION: 1. Significantly improved appearance, with resolution of prior hypermetabolic activity in all of the previously involved lymph nodes. All lymph nodes are currently within normal size limits. 2. Other imaging findings of potential clinical significance: Chronic right sphenoid sinusitis. Right mastoid effusion. Coronary, aortic arch, and branch vessel atherosclerotic vascular disease. Severe emphysema. Aortoiliac atherosclerotic vascular disease.  PATHOLOGY:     ASSESSMENT AND PLAN:  1. Stage III DLBCL, R-CHOP beginning on 07/30/2016.  Plan: PET results reviewed in detail with the patient and her family in detail today.She's had a great response to R-CHOP.  Continue for a total of 6 cycles of R-CHOP. Restaging PET after cycle 6.   Patient is going to be receiving cycle 4 of R-CHOP today.  I advised her to take her nausea medication as directed for nausea.  RTC in 3 weeks for follow up.   THERAPY PLAN:   Cycle 4 of RCHOP today.   All questions were answered. The patient knows to call the clinic with any problems, questions or concerns. We can certainly see the patient much sooner if necessary.  Patient and plan  discussed with Dr. Twana First and she is in agreement with the aforementioned.   This note is electronically signed by: Mikey College 10/01/2016 9:44 AM

## 2016-10-01 NOTE — Patient Instructions (Signed)
[facility] at [facility] Discharge Instructions  RECOMMENDATIONS MADE BY THE CONSULTANT AND ANY TEST RESULTS WILL BE SENT TO YOUR REFERRING PHYSICIAN.  You were seen today by Dr. Louise Zhou Follow up in 3 weeks See Amy up front for appointments '  Thank you for choosing [facility] at [facility] to provide your oncology and hematology care.  To afford each patient quality time with our provider, please arrive at least 15 minutes before your scheduled appointment time.    If you have a lab appointment with the Cancer Center please come in thru the  Main Entrance and check in at the main information desk  You need to re-schedule your appointment should you arrive 10 or more minutes late.  We strive to give you quality time with our providers, and arriving late affects you and other patients whose appointments are after yours.  Also, if you no show three or more times for appointments you may be dismissed from the clinic at the providers discretion.     Again, thank you for choosing [facility].  Our hope is that these requests will decrease the amount of time that you wait before being seen by our physicians.       _____________________________________________________________  Should you have questions after your visit to [facility], please contact our office at [phone] between the hours of 8:30 a.m. and 4:30 p.m.  Voicemails left after 4:30 p.m. will not be returned until the following business day.  For prescription refill requests, have your pharmacy contact our office.       Resources For Cancer Patients and their Caregivers ? American Cancer Society: Can assist with transportation, wigs, general needs, runs Look Good Feel Better.        1-888-227-6333 ? Cancer Care: Provides financial assistance, online support groups, medication/co-pay assistance.  1-800-813-HOPE (4673) ? [facility] Assists [place] cancer patients and their families through emotional , educational and financial support.  336-427-4357 ? [place] DSS Where to apply for food stamps, Medicaid and utility assistance. 336-342-1394 ? [facility]ATS: Transportation to medical appointments. 336-347-2287 ? Social Security Administration: May apply for disability if have a Stage IV cancer. 336-342-7796 1-800-772-1213 ? [place] Aging, Disability and Transit Services: Assists with nutrition, care and transit needs. 336-349-2343  Cancer Center Support Programs: @[email or web address]@ > Cancer Support Group  2nd Tuesday of the month 1pm-2pm, Journey Room  > Creative Journey  3rd Tuesday of the month 1130am-1pm, Journey Room  > Look Good Feel Better  1st Wednesday of the month 10am-12 noon, Journey Room (Call American Cancer Society to register 1-800-395-5775)    

## 2016-10-01 NOTE — Patient Instructions (Signed)
[facility] Discharge Instructions for Patients Receiving Chemotherapy   Beginning January 23rd 2017 lab work for the [facility] will be done in the  Main lab at [facility] on 1st floor. If you have a lab appointment with the [facility] please come in thru the  Main Entrance and check in at the main information desk   Today you received the following chemotherapy agents Rituxan,Adriamycin, Cytoxan,and Vincristine as well as Neulasta on-pro  To help prevent nausea and vomiting after your treatment, we encourage you to take your nausea medication   If you develop nausea and vomiting, or diarrhea that is not controlled by your medication, call the clinic.  The clinic phone number is [phone]. Office hours are Monday-Friday 8:30am-5:00pm.  BELOW ARE SYMPTOMS THAT SHOULD BE REPORTED IMMEDIATELY:  *FEVER GREATER THAN 101.0 F  *CHILLS WITH OR WITHOUT FEVER  NAUSEA AND VOMITING THAT IS NOT CONTROLLED WITH YOUR NAUSEA MEDICATION  *UNUSUAL SHORTNESS OF BREATH  *UNUSUAL BRUISING OR BLEEDING  TENDERNESS IN MOUTH AND THROAT WITH OR WITHOUT PRESENCE OF ULCERS  *URINARY PROBLEMS  *BOWEL PROBLEMS  UNUSUAL RASH Items with * indicate a potential emergency and should be followed up as soon as possible. If you have an emergency after office hours please contact your primary care physician or go to the nearest emergency department.  Please call the clinic during office hours if you have any questions or concerns.   You may also contact the Patient Navigator at [phone] should you have any questions or need assistance in obtaining follow up care.      Resources For Cancer Patients and their Caregivers ? American Cancer Society: Can assist with transportation, wigs, general needs, runs Look Good Feel Better.        813-872-7553 ? Cancer Care: Provides financial assistance, online support groups, medication/co-pay assistance.  1-800-813-HOPE  416-739-4068) ? [facility] Assists [place] cancer patients and their families through emotional , educational and financial support.  (570)546-9034 ? [place] DSS Where to apply for food stamps, Medicaid and utility assistance. (703) 504-7891 ? RCATS: Transportation to medical appointments. 986-414-2122 ? Social Security Administration: May apply for disability if have a Stage IV cancer. 505-444-2035 772-592-4493 ? [employer], Disability and Transit Services: Assists with nutrition, care and transit needs. (512)718-2583

## 2016-10-01 NOTE — Progress Notes (Signed)
Gabriella Love tolerated chemo tx with Neulasta on-pro well without complaints or incident. Labs reviewed with Dr. Talbert Cage prior to administering chemotherapy. Pt given K-DUR PO for Potassium of 3.3. Neulasta on-pro applied to pt's left arm with green indicator light flashing. VSS upon discharge. Pt discharged self ambulatory in satisfactory condition accompanied by family member

## 2016-10-13 ENCOUNTER — Other Ambulatory Visit (HOSPITAL_COMMUNITY): Payer: Self-pay | Admitting: Emergency Medicine

## 2016-10-13 DIAGNOSIS — E876 Hypokalemia: Secondary | ICD-10-CM

## 2016-10-13 MED ORDER — POTASSIUM CHLORIDE CRYS ER 20 MEQ PO TBCR: 40.0000 meq | EXTENDED_RELEASE_TABLET | Freq: Every day | ORAL | 0 refills | Status: DC

## 2016-10-13 NOTE — Progress Notes (Signed)
Pt called and wanted to send potassium to [facility]

## 2016-10-20 ENCOUNTER — Other Ambulatory Visit (HOSPITAL_COMMUNITY): Payer: Self-pay | Admitting: Oncology

## 2016-10-21 ENCOUNTER — Other Ambulatory Visit (HOSPITAL_COMMUNITY): Payer: Self-pay

## 2016-10-21 ENCOUNTER — Telehealth (HOSPITAL_COMMUNITY): Payer: Self-pay | Admitting: Oncology

## 2016-10-21 ENCOUNTER — Encounter (HOSPITAL_COMMUNITY): Payer: Self-pay

## 2016-10-21 DIAGNOSIS — C8338 Diffuse large B-cell lymphoma, lymph nodes of multiple sites: Secondary | ICD-10-CM

## 2016-10-21 DIAGNOSIS — C8330 Diffuse large B-cell lymphoma, unspecified site: Secondary | ICD-10-CM

## 2016-10-21 LAB — COMPREHENSIVE METABOLIC PANEL
ALBUMIN: 4.1 g/dL (ref 3.5–5.0)
ALT: 18 U/L (ref 14–54)
AST: 23 U/L (ref 15–41)
Alkaline Phosphatase: 59 U/L (ref 38–126)
Anion gap: 8 (ref 5–15)
BUN: 10 mg/dL (ref 6–20)
CHLORIDE: 102 mmol/L (ref 101–111)
CO2: 25 mmol/L (ref 22–32)
CREATININE: 0.71 mg/dL (ref 0.44–1.00)
Calcium: 9.6 mg/dL (ref 8.9–10.3)
GFR calc Af Amer: >60 mL/min (ref >60)
GLUCOSE: 116 mg/dL — AB (ref 65–99)
POTASSIUM: 3.7 mmol/L (ref 3.5–5.1)
Sodium: 135 mmol/L (ref 135–145)
Total Bilirubin: 0.5 mg/dL (ref 0.3–1.2)
Total Protein: 6.8 g/dL (ref 6.5–8.1)

## 2016-10-21 LAB — CBC WITH DIFFERENTIAL/PLATELET
Basophils Absolute: 0.1 10*3/uL (ref 0.0–0.1)
Basophils Relative: 1 %
EOS ABS: 0 10*3/uL (ref 0.0–0.7)
Eosinophils Relative: 0 %
HEMATOCRIT: 40.4 % (ref 36.0–46.0)
HEMOGLOBIN: 13.4 g/dL (ref 12.0–15.0)
LYMPHS ABS: 0.7 10*3/uL (ref 0.7–4.0)
LYMPHS PCT: 7 %
MCH: 33 pg (ref 26.0–34.0)
MCHC: 33.2 g/dL (ref 30.0–36.0)
MCV: 99.5 fL (ref 78.0–100.0)
MONOS PCT: 10 %
Monocytes Absolute: 1.1 10*3/uL — ABNORMAL HIGH (ref 0.1–1.0)
NEUTROS PCT: 82 %
Neutro Abs: 8.6 10*3/uL — ABNORMAL HIGH (ref 1.7–7.7)
Platelets: 329 10*3/uL (ref 150–400)
RBC: 4.06 MIL/uL (ref 3.87–5.11)
RDW: 17.8 % — ABNORMAL HIGH (ref 11.5–15.5)
WBC: 10.5 10*3/uL (ref 4.0–10.5)

## 2016-10-21 NOTE — Progress Notes (Signed)
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 Grade (if applicable): High grade. Flow cytometry: [id number] reveals a monoclonal B-cell population with expression of CD10. Immunohistochemical stains: CD20, CD3, CD10, bcl-2, bcl-6, CD21, CD23, Ki-67. Touch preps/imprints: Mixture of large and smaller lymphocytes with irregular nuclear contours. Comments: Sections of lymph node reveal effacement of the architecture by a nodular proliferation of neoplastic appearing follicles. In many of the areas the follicles coalesce and form sheets of large cells. The more formed follicles predominately consist of numerous large centroblasts (>15 per hpf), representing a high grade (3B) follicular component. The diffuse areas are also composed of large cells but lack follicular dendritic networks (CD21, CD23). Immunohistochemistry reveals the atypical lymphocytes are positive for CD20, CD10, and bcl-6. They are negative for bcl-2. Ki-67 is elevated (30% overall). CD3 reveals residual surrounding T-cells. Flow cytometry ([id number]) reveals a monoclonal B-cell population with expression of CD10. 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 The case was called to Dr. Rosana Hoes on  07/16/2016.      07/23/2016 Echocardiogram    Left ventricle: Systolic function was normal. The estimated ejection fraction was in the range of 50% to 55%. Doppler parameters are consistent with abnormal left ventricular relaxation (grade 1 diastolic dysfunction).      07/24/2016 Procedure    Technically successful CT guided right iliac bone core and aspiration biopsy by IR.      07/28/2016 PET scan    1. Hypermetabolic lymphadenopathy in the neck, abdomen and right inguinal area as described above, consistent with known lymphoma. 2. Focal area of hypermetabolism in the appendix could be lymphomatous involvement. Recommend attention on follow-up scans. 3. Age advanced atherosclerotic calcifications involving the abdominal aorta and branch vessels and age advanced three-vessel coronary artery calcifications. 4. No obvious osseous involvement. 5. Emphysematous changes and pulmonary scarring.      07/28/2016 Pathology Results    Bone Marrow, Aspirate,Biopsy, and Clot, right iliac - NORMOCELLULAR BONE MARROW WITH TRILINEAGE HEMATOPOIESIS. PERIPHERAL BLOOD: - OCCASIONAL CIRCULATING ATYPICAL LYMPHOID CELLS.      07/28/2016 Pathology Results    Bone Marrow Flow Cytometry - NO MONOCLONAL B CELL POPULATION OR ABNORMAL T CELL PHENOTYPE IDENTIFIED.      07/30/2016 Pathology Results    Peripheral Blood Flow Cytometry - NO MONOCLONAL B-CELL POPULATION OR ABNORMAL T-CELL PHENOTYPE IDENTIFIED.      07/30/2016 -  Chemotherapy    The patient had DOXOrubicin (ADRIAMYCIN) chemo injection 78 mg, 50 mg/m2 = 78 mg, Intravenous,  Once, 2 of 6 cycles  palonosetron (ALOXI) injection 0.25 mg, 0.25 mg, Intravenous,  Once, 2 of 6 cycles  pegfilgrastim (NEULASTA ONPRO KIT) injection 6 mg, 6 mg, Subcutaneous, Once, 2 of 6 cycles  vinCRIStine (ONCOVIN) 2 mg in sodium chloride 0.9 % 50 mL chemo infusion,  2 mg, Intravenous,  Once, 2 of 6 cycles  riTUXimab (RITUXAN) 600 mg in sodium chloride 0.9 % 250  mL (1.9355 mg/mL) chemo infusion, 375 mg/m2 = 600 mg, Intravenous,  Once, 2 of 6 cycles  cyclophosphamide (CYTOXAN) 1,180 mg in sodium chloride 0.9 % 250 mL chemo infusion, 750 mg/m2 = 1,180 mg, Intravenous,  Once, 2 of 6 cycles  for chemotherapy treatment.        08/03/2016 Pathology Results    Cytogenetic lab results [facility]). Cytogenetic Analysis: Normal.       09/23/2016 PET scan    1. Significantly improved appearance, with resolution of prior hypermetabolic activity in all of the previously involved lymph nodes. All lymph nodes are currently within normal size limits. 2. Other imaging findings of potential clinical significance: Chronic right sphenoid sinusitis. Right mastoid effusion. Coronary, aortic arch, and branch vessel atherosclerotic vascular disease. Severe emphysema. Aortoiliac atherosclerotic vascular disease.        INTERVAL HISTORY:  Gabriella Love 61 y.o. female presents for continued follow-up and consideration for next cycle of chemotherapy.   She is due for cycle #5 R-CHOP today. She is seen today with her significant other, Gabriella Love, at the bedside in the treatment/infusion area.  Her biggest complaint today is fatigue and weight loss. She feels "weak all the time" and has decreased appetite.  She has occasional pain with swallowing, but this is not constant and improves several days after chemotherapy.    For breakfast, she generally eats bacon, eggs, and toast with butter and jelly. For lunch, she may eat 1/2 of peanut butter & jelly sandwich or some chicken noodle soup. For dinner, she will fix Gabriella Love some meat and she will eat some of the sides like rice & gravy.  She supplements her meals with about 1 Carnation Instant Breakfast per day. She cannot drink Boost or Ensure as it makes her nauseated.    She tries to stay active at home by cooking some and walking around the house. She has to take frequent breaks d/t fatigue; "I just give out." She wants to know if there is  anything she can take to help with her energy.    She feels ready to proceed with chemotherapy today.     REVIEW OF SYSTEMS:  Review of Systems  Constitutional: Positive for appetite change (decreased appetite) and fatigue. Negative for chills and fever.  HENT:   Positive for trouble swallowing (pain with swallowing at times ).   Eyes: Negative.   Respiratory: Positive for shortness of breath (DOE). Negative for cough.   Cardiovascular: Negative.  Negative for chest pain and leg swelling.  Gastrointestinal: Negative.  Negative for abdominal pain, blood in stool, constipation, diarrhea, nausea and vomiting.  Endocrine: Negative.   Genitourinary: Negative.  Negative for dysuria, hematuria and vaginal bleeding.   Skin: Negative.  Negative for rash.  Neurological: Positive for extremity weakness.  Hematological: Negative.   Psychiatric/Behavioral: Negative.      PAST MEDICAL/SURGICAL HISTORY:  Past Medical History:  Diagnosis Date  . Anxiety   . Arthritis   . DLBCL (diffuse large B cell lymphoma) ([facility]) 07/16/2016  . GERD (gastroesophageal reflux disease)   . Heart disease   . Hyperlipidemia   . Hypertension   . Myocardial infarction 12/19/2009   released from cardiology   Past Surgical History:  Procedure Laterality Date  . CARDIAC CATHETERIZATION     cardiac stent  . CHOLECYSTECTOMY    . CORONARY STENT PLACEMENT  2011  . INGUINAL LYMPH  NODE BIOPSY Left 07/13/2016   Procedure: EXCISIONAL BIOPSY OF LEFT INGUINAL LYMPH NODE;  Surgeon: Vickie Epley, MD;  Location: [facility];  Service: General;  Laterality: Left;  . PORTACATH PLACEMENT N/A 07/17/2016   Procedure: INSERTION OF TUNNELED CENTRAL VENOUS CATHETER WITH SUBCUTANEOUS PORT;  Surgeon: Vickie Epley, MD;  Location: [facility];  Service: Vascular;  Laterality: N/A;  . TUBAL LIGATION       SOCIAL HISTORY:  Social History   Social History  . Marital status: Single    Spouse name: N/A  . Number of children: N/A  .  Years of education: N/A   Occupational History  . Not on file.   Social History Main Topics  . Smoking status: Current Every Day Smoker    Packs/day: 0.50    Years: 40.00    Types: Cigarettes  . Smokeless tobacco: Never Used  . Alcohol use No  . Drug use: No  . Sexual activity: Yes    Birth control/ protection: Post-menopausal   Other Topics Concern  . Not on file   Social History Narrative  . No narrative on file    FAMILY HISTORY:  Family History  Problem Relation Age of Onset  . Cancer Mother     colon  . Depression Mother   . Heart disease Father   . Depression Sister   . Cancer Sister     leukemia  . Hyperlipidemia Brother   . Hypertension Brother     CURRENT MEDICATIONS:  Outpatient Encounter Prescriptions as of 10/22/2016  Medication Sig  . acetaminophen (TYLENOL) 650 MG CR tablet Take 650 mg by mouth every 8 (eight) hours as needed for pain.  Marland Kitchen allopurinol (ZYLOPRIM) 300 MG tablet Take 1 tablet (300 mg total) by mouth daily.  Marland Kitchen ALPRAZolam (XANAX) 0.5 MG tablet Take 0.5 mg by mouth daily as needed for anxiety.  Marland Kitchen aspirin 325 MG tablet Take 325 mg by mouth daily.    Marland Kitchen buPROPion (WELLBUTRIN XL) 150 MG 24 hr tablet Take 150 mg by mouth every evening.  . cyclophosphamide (CYTOXAN) 2 g chemo injection Inject into the vein once.  . dicyclomine (BENTYL) 20 MG tablet Take 1 tablet (20 mg total) by mouth 4 (four) times daily -  before meals and at bedtime.  Marland Kitchen DOXOrubicin HCl (ADRIAMYCIN IV) Inject into the vein.  . fluticasone (FLONASE) 50 MCG/ACT nasal spray Place 1 spray into both nostrils daily as needed for allergies.   Marland Kitchen ketoconazole (NIZORAL) 2 % cream Apply 1 application topically daily. Apply to irritated skin when dry.  . lidocaine-prilocaine (EMLA) cream Apply a quarter size amount to affected area 1 hour prior to coming to chemotherapy.  . metoprolol succinate (TOPROL-XL) 25 MG 24 hr tablet Take 25 mg by mouth daily.    . nitroGLYCERIN (NITROSTAT) 0.4 MG SL  tablet Place 0.4 mg under the tongue every 5 (five) minutes as needed for chest pain.   Marland Kitchen ondansetron (ZOFRAN) 8 MG tablet Take 1 tablet (8 mg total) by mouth every 8 (eight) hours as needed for nausea or vomiting.  . pantoprazole (PROTONIX) 40 MG tablet Take 1 tablet (40 mg total) by mouth daily.  . Pegfilgrastim (NEULASTA ONPRO [place]) Inject into the skin.  . predniSONE (DELTASONE) 20 MG tablet Take 3 tablets (60 mg) daily on days 1-5 of chemotherapy.  . prochlorperazine (COMPAZINE) 10 MG tablet Take 1 tablet (10 mg total) by mouth every 6 (six) hours as needed for nausea or vomiting.  . RiTUXimab (RITUXAN  IV) Inject into the vein.  . simvastatin (ZOCOR) 40 MG tablet Take 40 mg by mouth at bedtime.    . vinCRIStine 2 mg in sodium chloride 0.9 % 50 mL Inject into the vein once.  . potassium chloride SA (K-DUR,KLOR-CON) 20 MEQ tablet Take 2 tablets (40 mEq total) by mouth daily. (Patient not taking: Reported on 10/22/2016)   No facility-administered encounter medications on file as of 10/22/2016.     ALLERGIES:  Allergies  Allergen Reactions  . Amoxicillin Nausea And Vomiting    Yeast Infections  Has patient had a PCN reaction causing immediate rash, facial/tongue/throat swelling, SOB or lightheadedness with hypotension: No Has patient had a PCN reaction causing severe rash involving mucus membranes or skin necrosis: No Has patient had a PCN reaction that required hospitalization No Has patient had a PCN reaction occurring within the last 10 years: No If all of the above answers are "NO", then may proceed with Cephalosporin use.   Marland Kitchen Choline Fenofibrate     REACTION: Abd pain and elevated Lipase  . Oxycodone-Acetaminophen Nausea And Vomiting  . Penicillins     Yeast infections, thrush        PHYSICAL EXAM:  ECOG Performance status: 1-2 - Symptomatic; requires occasional assistance.   Vitals:   10/22/16 0903  BP: 113/75  Pulse: (!) 112  Resp: 16  Temp: 98 F (36.7 C)   Filed  Weights   10/22/16 0903  Weight: 105 lb 4.8 oz (47.8 kg)     Physical Exam  Constitutional: She is oriented to person, place, and time.  Thin female in no acute distress; seen lying in treatment area bed with significant other by bedside.   HENT:  Head: Normocephalic.  Mouth/Throat: No oropharyngeal exudate.  Eyes: Conjunctivae are normal. No scleral icterus.  Neck: Normal range of motion. Neck supple.  Cardiovascular: Normal rate and normal heart sounds.   Tachycardic   Pulmonary/Chest: Effort normal and breath sounds normal. No respiratory distress. She has no wheezes.  Abdominal: Soft. Bowel sounds are normal. There is no tenderness. There is no rebound.  Musculoskeletal: Normal range of motion. She exhibits no edema.  Lymphadenopathy:    She has no cervical adenopathy.  Neurological: She is alert and oriented to person, place, and time. No cranial nerve deficit.  Skin: Skin is warm and dry. No rash noted.  Psychiatric: Mood, memory, affect and judgment normal.  Nursing note and vitals reviewed.    LABORATORY DATA:  I have reviewed the labs as listed.  CBC    Component Value Date/Time   WBC 10.5 10/21/2016 1304   RBC 4.06 10/21/2016 1304   HGB 13.4 10/21/2016 1304   HCT 40.4 10/21/2016 1304   PLT 329 10/21/2016 1304   MCV 99.5 10/21/2016 1304   MCH 33.0 10/21/2016 1304   MCHC 33.2 10/21/2016 1304   RDW 17.8 (H) 10/21/2016 1304   LYMPHSABS 0.7 10/21/2016 1304   MONOABS 1.1 (H) 10/21/2016 1304   EOSABS 0.0 10/21/2016 1304   BASOSABS 0.1 10/21/2016 1304   CMP Latest Ref Rng & Units 10/21/2016 09/30/2016 09/09/2016  Glucose 65 - 99 mg/dL 116(H) 120(H) 118(H)  BUN 6 - 20 mg/dL _0 Creatinine 0.44 - 1.00 mg/dL 0.71 0.73 0.78  Sodium 135 - 145 mmol/L 135 140 138  Potassium 3.5 - 5.1 mmol/L 3.7 3.3(L) 4.1  Chloride 101 - 111 mmol/L 102 103 102  CO2 22 - 32 mmol/L _1 Calcium 8.9 - 10.3 mg/dL 9.6  9.5 9.7  Total Protein 6.5 - 8.1 g/dL 6.8 6.7 7.1  Total  Bilirubin 0.3 - 1.2 mg/dL 0.5 0.5 0.8  Alkaline Phos 38 - 126 U/L 59 62 67  AST 15 - 41 U/L _0 ALT 14 - 54 U/L _1 PENDING LABS:    DIAGNOSTIC IMAGING:  PET scan: 09/23/16     PATHOLOGY:  (L) inguinal LN biopsy: 07/13/16      ASSESSMENT & PLAN:   Stage IIIA DLBCL:  -Diagnosed in 06/2016; IPI score 1 with low-risk disease. Currently undergoing chemotherapy with R-CHOP. Her mid-treatment PET scan in 08/2016 was encouraging, as it showed significant improvement in hypermetabolic lymph nodes.  We discussed that we will re-stage after her last cycle of chemotherapy to determine resolution of disease; she understands that if there is residual disease, then she may require radiation therapy.  -Labs reviewed and are adequate for cycle #5 chemotherapy today.  -Return to cancer center as directed for cycle #6.   Fatigue:  -Likely secondary to protein-calorie malnutrition.  -Encouraged her to remain active and slowly work on rebuilding her strength and stamina.   Protein-calorie malnutrition:  -Weight loss of about ~10 lbs in the past 6 weeks noted. We discussed the importance of increased caloric and protein intake during and after chemotherapy, as adequate nutrition will help facilitate quicker healing.  -Recommended she eat several small meals per day and increase her Carnation Instant Breakfast supplements to ~3 per day.  I offered to place nutrition referral, but she declined.     Dispo:  -Return to cancer center as scheduled for cycle #6 chemotherapy.    All questions were answered to patient's stated satisfaction. Encouraged patient to call with any new concerns or questions before her next visit to the cancer center and we can certain see her sooner, if needed.    Plan of care discussed with Dr. Talbert Cage, who agrees with the above aforementioned.    Orders placed this encounter:  No orders of the defined types were placed in this encounter.     Mike Craze, NP [place] [phone]

## 2016-10-21 NOTE — Telephone Encounter (Signed)
FAXED [facility](RITUXAN)  REQUESTING RX REPLACEMENT DOS 1/4-10/01/16 4 VIALS  FAXED [facility] (NEULASTA) REQUESTING RX REPLACEMENT DOS 1/4-10/01/16 4 INJ  FAXED [facility] (ALOXI)    RX REPLACEMENT DOS 1/25-3/8 3 DOSES

## 2016-10-22 ENCOUNTER — Encounter (HOSPITAL_COMMUNITY): Payer: Self-pay | Admitting: Adult Health

## 2016-10-22 ENCOUNTER — Encounter (HOSPITAL_BASED_OUTPATIENT_CLINIC_OR_DEPARTMENT_OTHER): Payer: Self-pay | Admitting: Adult Health

## 2016-10-22 ENCOUNTER — Encounter (HOSPITAL_BASED_OUTPATIENT_CLINIC_OR_DEPARTMENT_OTHER): Payer: Self-pay

## 2016-10-22 VITALS — BP 113/59 | HR 87 | Temp 98.3°F | Resp 18

## 2016-10-22 VITALS — BP 113/75 | HR 112 | Temp 98.0°F | Resp 16 | Ht 62.0 in | Wt 105.3 lb

## 2016-10-22 DIAGNOSIS — Z5111 Encounter for antineoplastic chemotherapy: Secondary | ICD-10-CM

## 2016-10-22 DIAGNOSIS — R531 Weakness: Secondary | ICD-10-CM

## 2016-10-22 DIAGNOSIS — Z5189 Encounter for other specified aftercare: Secondary | ICD-10-CM

## 2016-10-22 DIAGNOSIS — C8335 Diffuse large B-cell lymphoma, lymph nodes of inguinal region and lower limb: Secondary | ICD-10-CM

## 2016-10-22 DIAGNOSIS — C8330 Diffuse large B-cell lymphoma, unspecified site: Secondary | ICD-10-CM

## 2016-10-22 DIAGNOSIS — C8338 Diffuse large B-cell lymphoma, lymph nodes of multiple sites: Secondary | ICD-10-CM

## 2016-10-22 DIAGNOSIS — E46 Unspecified protein-calorie malnutrition: Secondary | ICD-10-CM

## 2016-10-22 DIAGNOSIS — R634 Abnormal weight loss: Secondary | ICD-10-CM

## 2016-10-22 MED ORDER — SODIUM CHLORIDE 0.9% FLUSH
10.0000 mL | INTRAVENOUS | Status: DC | PRN
  Administered 2016-10-22: 10 mL
  Filled 2016-10-22: qty 10

## 2016-10-22 MED ORDER — SODIUM CHLORIDE 0.9 % IV SOLN
380.0000 mg/m2 | Freq: Once | INTRAVENOUS | Status: AC
  Administered 2016-10-22: 600 mg via INTRAVENOUS
  Filled 2016-10-22: qty 50

## 2016-10-22 MED ORDER — PEGFILGRASTIM 6 MG/0.6ML ~~LOC~~ PSKT
6.0000 mg | PREFILLED_SYRINGE | Freq: Once | SUBCUTANEOUS | Status: AC
  Administered 2016-10-22: 6 mg via SUBCUTANEOUS
  Filled 2016-10-22: qty 0.6

## 2016-10-22 MED ORDER — SODIUM CHLORIDE 0.9 % IV SOLN: 10.0000 mg | Freq: Once | INTRAVENOUS | Status: DC

## 2016-10-22 MED ORDER — ACETAMINOPHEN 325 MG PO TABS
650.0000 mg | ORAL_TABLET | Freq: Once | ORAL | Status: AC
  Administered 2016-10-22: 650 mg via ORAL
  Filled 2016-10-22: qty 2

## 2016-10-22 MED ORDER — VINCRISTINE SULFATE CHEMO INJECTION 1 MG/ML
2.0000 mg | Freq: Once | INTRAVENOUS | Status: AC
  Administered 2016-10-22: 2 mg via INTRAVENOUS
  Filled 2016-10-22: qty 2

## 2016-10-22 MED ORDER — PALONOSETRON HCL INJECTION 0.25 MG/5ML
0.2500 mg | Freq: Once | INTRAVENOUS | Status: AC
  Administered 2016-10-22: 0.25 mg via INTRAVENOUS
  Filled 2016-10-22: qty 5

## 2016-10-22 MED ORDER — DIPHENHYDRAMINE HCL 25 MG PO CAPS
50.0000 mg | ORAL_CAPSULE | Freq: Once | ORAL | Status: AC
  Administered 2016-10-22: 50 mg via ORAL
  Filled 2016-10-22: qty 2

## 2016-10-22 MED ORDER — HEPARIN SOD (PORK) LOCK FLUSH 100 UNIT/ML IV SOLN
500.0000 [IU] | Freq: Once | INTRAVENOUS | Status: AC | PRN
  Administered 2016-10-22: 500 [IU]
  Filled 2016-10-22: qty 5

## 2016-10-22 MED ORDER — DEXAMETHASONE SODIUM PHOSPHATE 10 MG/ML IJ SOLN
10.0000 mg | Freq: Once | INTRAMUSCULAR | Status: AC
  Administered 2016-10-22: 10 mg via INTRAVENOUS
  Filled 2016-10-22: qty 1

## 2016-10-22 MED ORDER — HEPARIN SOD (PORK) LOCK FLUSH 100 UNIT/ML IV SOLN
INTRAVENOUS | Status: AC
  Filled 2016-10-22: qty 5

## 2016-10-22 MED ORDER — SODIUM CHLORIDE 0.9 % IV SOLN
Freq: Once | INTRAVENOUS | Status: AC
  Administered 2016-10-22: 10:00:00 via INTRAVENOUS

## 2016-10-22 MED ORDER — CYCLOPHOSPHAMIDE CHEMO INJECTION 1 GM
750.0000 mg/m2 | Freq: Once | INTRAMUSCULAR | Status: AC
  Administered 2016-10-22: 1080 mg via INTRAVENOUS
  Filled 2016-10-22: qty 50

## 2016-10-22 MED ORDER — DOXORUBICIN HCL CHEMO IV INJECTION 2 MG/ML
50.0000 mg/m2 | Freq: Once | INTRAVENOUS | Status: AC
  Administered 2016-10-22: 72 mg via INTRAVENOUS
  Filled 2016-10-22: qty 36

## 2016-10-22 NOTE — Patient Instructions (Signed)
[facility] Discharge Instructions for Patients Receiving Chemotherapy   Beginning January 23rd 2017 lab work for the Cancer Center will be done in the  Main lab at [place] on 1st floor. If you have a lab appointment with the Cancer Center please come in thru the  Main Entrance and check in at the main information desk   Today you received the following chemotherapy agents   To help prevent nausea and vomiting after your treatment, we encourage you to take your nausea medication     If you develop nausea and vomiting, or diarrhea that is not controlled by your medication, call the clinic.  The clinic phone number is [phone]. Office hours are Monday-Friday 8:30am-5:00pm.  BELOW ARE SYMPTOMS THAT SHOULD BE REPORTED IMMEDIATELY:  *FEVER GREATER THAN 101.0 F  *CHILLS WITH OR WITHOUT FEVER  NAUSEA AND VOMITING THAT IS NOT CONTROLLED WITH YOUR NAUSEA MEDICATION  *UNUSUAL SHORTNESS OF BREATH  *UNUSUAL BRUISING OR BLEEDING  TENDERNESS IN MOUTH AND THROAT WITH OR WITHOUT PRESENCE OF ULCERS  *URINARY PROBLEMS  *BOWEL PROBLEMS  UNUSUAL RASH Items with * indicate a potential emergency and should be followed up as soon as possible. If you have an emergency after office hours please contact your primary care physician or go to the nearest emergency department.  Please call the clinic during office hours if you have any questions or concerns.   You may also contact the Patient Navigator at [phone] should you have any questions or need assistance in obtaining follow up care.      Resources For Cancer Patients and their Caregivers ? American Cancer Society: Can assist with transportation, wigs, general needs, runs Look Good Feel Better.        1-888-227-6333 ? Cancer Care: Provides financial assistance, online support groups, medication/co-pay assistance.  1-800-813-HOPE (4673) ? [facility] Assists [place] cancer  patients and their families through emotional , educational and financial support.  336-427-4357 ? [place] DSS Where to apply for food stamps, Medicaid and utility assistance. 336-342-1394 ? RCATS: Transportation to medical appointments. 336-347-2287 ? Social Security Administration: May apply for disability if have a Stage IV cancer. 336-342-7796 1-800-772-1213 ? [place] Aging, Disability and Transit Services: Assists with nutrition, care and transit needs. 336-349-2343         

## 2016-10-22 NOTE — Patient Instructions (Addendum)
[place] at [facility] Discharge Instructions  RECOMMENDATIONS MADE BY THE CONSULTANT AND ANY TEST RESULTS WILL BE SENT TO YOUR REFERRING PHYSICIAN.  You were seen today by Mike Craze NP. Increase your carnation breakfast drinks to 3-4 a day. Return for follow up with your next treatment.    Thank you for choosing [facility] at [facility] to provide your oncology and hematology care.  To afford each patient quality time with our provider, please arrive at least 15 minutes before your scheduled appointment time.    If you have a lab appointment with the [facility] please come in thru the  Main Entrance and check in at the main information desk  You need to re-schedule your appointment should you arrive 10 or more minutes late.  We strive to give you quality time with our providers, and arriving late affects you and other patients whose appointments are after yours.  Also, if you no show three or more times for appointments you may be dismissed from the clinic at the providers discretion.     Again, thank you for choosing [facility].  Our hope is that these requests will decrease the amount of time that you wait before being seen by our physicians.       _____________________________________________________________  Should you have questions after your visit to [facility], please contact our office at [phone] between the hours of 8:30 a.m. and 4:30 p.m.  Voicemails left after 4:30 p.m. will not be returned until the following business day.  For prescription refill requests, have your pharmacy contact our office.       Resources For Cancer Patients and their Caregivers ? American Cancer Society: Can assist with transportation, wigs, general needs, runs Look Good Feel Better.        (930)591-5663 ? Cancer Care: Provides financial assistance, online support groups, medication/co-pay assistance.   1-800-813-HOPE (680)610-2315) ? [facility] Assists [place] cancer patients and their families through emotional , educational and financial support.  614-592-1256 ? [place] DSS Where to apply for food stamps, Medicaid and utility assistance. (873)144-8976 ? RCATS: Transportation to medical appointments. (220)522-4446 ? Social Security Administration: May apply for disability if have a Stage IV cancer. 787-077-2099 732-660-3263 ? [employer], Disability and Transit Services: Assists with nutrition, care and transit needs. [facility] Support Programs: @10RELATIVEDAYS @ > Cancer Support Group  2nd Tuesday of the month 1pm-2pm, Journey Room  > Creative Journey  3rd Tuesday of the month 1130am-1pm, Journey Room  > Look Good Feel Better  1st Wednesday of the month 10am-12 noon, Journey Room (Call [place] to register [phone])

## 2016-10-22 NOTE — Progress Notes (Signed)
Chemotherapy given today per orders. Patient tolerated it well without issues. Vitals stable and discharged home from clinic ambulatory with husband.follow up as scheduled.

## 2016-10-22 NOTE — Progress Notes (Signed)
Patient requesting refill of pain medication.  [place] Controlled Substance Registry reviewed; refill appropriate on 11/01/16. Paper prescription given to patient today.   Mike Craze, NP [place] [phone]

## 2016-11-07 ENCOUNTER — Other Ambulatory Visit (HOSPITAL_COMMUNITY): Payer: Self-pay | Admitting: Hematology & Oncology

## 2016-11-07 DIAGNOSIS — C8338 Diffuse large B-cell lymphoma, lymph nodes of multiple sites: Secondary | ICD-10-CM

## 2016-11-10 ENCOUNTER — Telehealth (HOSPITAL_COMMUNITY): Payer: Self-pay | Admitting: Oncology

## 2016-11-10 NOTE — Telephone Encounter (Signed)
Per Gabriella Love [place] Amy rep the verified shipping info so we she have a delivery of rituxan in 7-10 business days from today delivered by [employer]

## 2016-11-11 ENCOUNTER — Encounter (HOSPITAL_COMMUNITY): Payer: Self-pay | Attending: Oncology

## 2016-11-11 DIAGNOSIS — C8338 Diffuse large B-cell lymphoma, lymph nodes of multiple sites: Secondary | ICD-10-CM

## 2016-11-11 DIAGNOSIS — C8330 Diffuse large B-cell lymphoma, unspecified site: Secondary | ICD-10-CM | POA: Insufficient documentation

## 2016-11-11 LAB — COMPREHENSIVE METABOLIC PANEL
ALT: 17 U/L (ref 14–54)
AST: 20 U/L (ref 15–41)
Albumin: 4 g/dL (ref 3.5–5.0)
Alkaline Phosphatase: 57 U/L (ref 38–126)
Anion gap: 9 (ref 5–15)
BUN: 9 mg/dL (ref 6–20)
CHLORIDE: 103 mmol/L (ref 101–111)
CO2: 26 mmol/L (ref 22–32)
Calcium: 9.3 mg/dL (ref 8.9–10.3)
Creatinine, Ser: 0.77 mg/dL (ref 0.44–1.00)
Glucose, Bld: 104 mg/dL — ABNORMAL HIGH (ref 65–99)
POTASSIUM: 3.5 mmol/L (ref 3.5–5.1)
Sodium: 138 mmol/L (ref 135–145)
Total Bilirubin: 0.4 mg/dL (ref 0.3–1.2)
Total Protein: 6.5 g/dL (ref 6.5–8.1)

## 2016-11-11 LAB — PHOSPHORUS: PHOSPHORUS: 4.1 mg/dL (ref 2.5–4.6)

## 2016-11-11 LAB — CBC WITH DIFFERENTIAL/PLATELET
Basophils Absolute: 0.1 10*3/uL (ref 0.0–0.1)
Basophils Relative: 1 %
EOS PCT: 0 %
Eosinophils Absolute: 0 10*3/uL (ref 0.0–0.7)
HCT: 37 % (ref 36.0–46.0)
Hemoglobin: 12.4 g/dL (ref 12.0–15.0)
LYMPHS ABS: 0.6 10*3/uL — AB (ref 0.7–4.0)
LYMPHS PCT: 8 %
MCH: 34.2 pg — AB (ref 26.0–34.0)
MCHC: 33.5 g/dL (ref 30.0–36.0)
MCV: 101.9 fL — AB (ref 78.0–100.0)
MONO ABS: 1.1 10*3/uL — AB (ref 0.1–1.0)
MONOS PCT: 14 %
Neutro Abs: 6 10*3/uL (ref 1.7–7.7)
Neutrophils Relative %: 77 %
PLATELETS: 296 10*3/uL (ref 150–400)
RBC: 3.63 MIL/uL — ABNORMAL LOW (ref 3.87–5.11)
RDW: 16.4 % — AB (ref 11.5–15.5)
WBC: 7.8 10*3/uL (ref 4.0–10.5)

## 2016-11-11 LAB — URIC ACID: Uric Acid, Serum: 2.9 mg/dL (ref 2.3–6.6)

## 2016-11-12 ENCOUNTER — Encounter (HOSPITAL_COMMUNITY): Payer: Self-pay

## 2016-11-12 ENCOUNTER — Encounter (HOSPITAL_BASED_OUTPATIENT_CLINIC_OR_DEPARTMENT_OTHER): Payer: Self-pay

## 2016-11-12 ENCOUNTER — Encounter (HOSPITAL_BASED_OUTPATIENT_CLINIC_OR_DEPARTMENT_OTHER): Payer: Self-pay | Admitting: Oncology

## 2016-11-12 VITALS — BP 106/56 | HR 87 | Temp 98.5°F | Resp 18 | Wt 104.2 lb

## 2016-11-12 DIAGNOSIS — Z5189 Encounter for other specified aftercare: Secondary | ICD-10-CM

## 2016-11-12 DIAGNOSIS — C8335 Diffuse large B-cell lymphoma, lymph nodes of inguinal region and lower limb: Secondary | ICD-10-CM

## 2016-11-12 DIAGNOSIS — C8330 Diffuse large B-cell lymphoma, unspecified site: Secondary | ICD-10-CM

## 2016-11-12 DIAGNOSIS — C8338 Diffuse large B-cell lymphoma, lymph nodes of multiple sites: Secondary | ICD-10-CM

## 2016-11-12 DIAGNOSIS — Z5112 Encounter for antineoplastic immunotherapy: Secondary | ICD-10-CM

## 2016-11-12 DIAGNOSIS — Z5111 Encounter for antineoplastic chemotherapy: Secondary | ICD-10-CM

## 2016-11-12 MED ORDER — HEPARIN SOD (PORK) LOCK FLUSH 100 UNIT/ML IV SOLN
500.0000 [IU] | Freq: Once | INTRAVENOUS | Status: AC | PRN
  Administered 2016-11-12: 500 [IU]
  Filled 2016-11-12: qty 5

## 2016-11-12 MED ORDER — ACETAMINOPHEN 325 MG PO TABS
650.0000 mg | ORAL_TABLET | Freq: Once | ORAL | Status: AC
  Administered 2016-11-12: 650 mg via ORAL
  Filled 2016-11-12: qty 2

## 2016-11-12 MED ORDER — SODIUM CHLORIDE 0.9 % IV SOLN
Freq: Once | INTRAVENOUS | Status: AC
  Administered 2016-11-12: 09:00:00 via INTRAVENOUS

## 2016-11-12 MED ORDER — VINCRISTINE SULFATE CHEMO INJECTION 1 MG/ML
2.0000 mg | Freq: Once | INTRAVENOUS | Status: AC
  Administered 2016-11-12: 2 mg via INTRAVENOUS
  Filled 2016-11-12: qty 2

## 2016-11-12 MED ORDER — CYCLOPHOSPHAMIDE CHEMO INJECTION 1 GM
750.0000 mg/m2 | Freq: Once | INTRAMUSCULAR | Status: AC
  Administered 2016-11-12: 1080 mg via INTRAVENOUS
  Filled 2016-11-12: qty 50

## 2016-11-12 MED ORDER — SODIUM CHLORIDE 0.9 % IV SOLN: 10.0000 mg | Freq: Once | INTRAVENOUS | Status: DC

## 2016-11-12 MED ORDER — DEXAMETHASONE SODIUM PHOSPHATE 10 MG/ML IJ SOLN
10.0000 mg | Freq: Once | INTRAMUSCULAR | Status: AC
  Administered 2016-11-12: 10 mg via INTRAVENOUS
  Filled 2016-11-12: qty 1

## 2016-11-12 MED ORDER — DOXORUBICIN HCL CHEMO IV INJECTION 2 MG/ML
50.0000 mg/m2 | Freq: Once | INTRAVENOUS | Status: AC
  Administered 2016-11-12: 72 mg via INTRAVENOUS
  Filled 2016-11-12: qty 36

## 2016-11-12 MED ORDER — GABAPENTIN 300 MG PO CAPS: 300.0000 mg | ORAL_CAPSULE | Freq: Three times a day (TID) | ORAL | 3 refills | Status: DC

## 2016-11-12 MED ORDER — PALONOSETRON HCL INJECTION 0.25 MG/5ML
0.2500 mg | Freq: Once | INTRAVENOUS | Status: AC
  Administered 2016-11-12: 0.25 mg via INTRAVENOUS

## 2016-11-12 MED ORDER — RITUXIMAB CHEMO INJECTION 500 MG/50ML
375.0000 mg/m2 | Freq: Once | INTRAVENOUS | Status: AC
  Administered 2016-11-12: 500 mg via INTRAVENOUS
  Filled 2016-11-12: qty 50

## 2016-11-12 MED ORDER — PEGFILGRASTIM 6 MG/0.6ML ~~LOC~~ PSKT
6.0000 mg | PREFILLED_SYRINGE | Freq: Once | SUBCUTANEOUS | Status: AC
  Administered 2016-11-12: 6 mg via SUBCUTANEOUS
  Filled 2016-11-12: qty 0.6

## 2016-11-12 MED ORDER — DIPHENHYDRAMINE HCL 25 MG PO CAPS
50.0000 mg | ORAL_CAPSULE | Freq: Once | ORAL | Status: AC
Start: 2016-11-12 — End: 2016-11-12
  Administered 2016-11-12: 50 mg via ORAL
  Filled 2016-11-12: qty 2

## 2016-11-12 NOTE — Progress Notes (Signed)
[name], MD [place] [address]  Diffuse large B-cell lymphoma, unspecified body region [facility]) - Plan: NM PET Image Restag (PS) Skull Base To Thigh, CBC with Differential, Comprehensive metabolic panel, Lactate dehydrogenase  Progress Note  CURRENT THERAPY: R-CHOP beginning on 07/30/2016  INTERVAL HISTORY: Gabriella Love 61 y.o. female returns for followup of Stage III DLBCL, R-CHOP beginning on 07/30/2016.    DLBCL (diffuse large B cell lymphoma) ([place])   07/13/2016 Initial Biopsy    L inguinal biopsy with Dr. 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 Grade (if applicable): High grade. Flow cytometry: [id number] reveals a monoclonal B-cell population with expression of CD10. Immunohistochemical stains: CD20, CD3, CD10, bcl-2, bcl-6, CD21, CD23, Ki-67. Touch preps/imprints: Mixture of large and smaller lymphocytes with irregular nuclear contours. Comments: Sections of lymph node reveal effacement of the architecture by a nodular proliferation of neoplastic appearing follicles. In many of the areas the follicles coalesce and form sheets of large cells. The more formed follicles predominately consist of numerous large centroblasts (>15 per hpf), representing a high grade (3B) follicular component. The diffuse areas are also composed of large cells but lack follicular dendritic networks (CD21, CD23). Immunohistochemistry reveals the atypical lymphocytes are positive for CD20, CD10, and bcl-6. They are negative for bcl-2. Ki-67 is elevated (30% overall). CD3 reveals residual surrounding T-cells. Flow cytometry ([id number]) reveals a monoclonal B-cell population with expression of CD10. Overall, these findings are consistent with a diffuse large  B-cell lymphoma arising from a follicular lymphoma. The case was called to Dr. Rosana Hoes on 07/16/2016.      07/23/2016 Echocardiogram    Left ventricle: Systolic function was normal. The estimated ejection fraction was in the range of 50% to 55%. Doppler parameters are consistent with abnormal left ventricular relaxation (grade 1 diastolic dysfunction).      07/24/2016 Procedure    Technically successful CT guided right iliac bone core and aspiration biopsy by IR.      07/28/2016 PET scan    1. Hypermetabolic lymphadenopathy in the neck, abdomen and right inguinal area as described above, consistent with known lymphoma. 2. Focal area of hypermetabolism in the appendix could be lymphomatous involvement. Recommend attention on follow-up scans. 3. Age advanced atherosclerotic calcifications involving the abdominal aorta and branch vessels and age advanced three-vessel coronary artery calcifications. 4. No obvious osseous involvement. 5. Emphysematous changes and pulmonary scarring.      07/28/2016 Pathology Results    Bone Marrow, Aspirate,Biopsy, and Clot, right iliac - NORMOCELLULAR BONE MARROW WITH TRILINEAGE HEMATOPOIESIS. PERIPHERAL BLOOD: - OCCASIONAL CIRCULATING ATYPICAL LYMPHOID CELLS.      07/28/2016 Pathology Results    Bone Marrow Flow Cytometry - NO MONOCLONAL B CELL POPULATION OR ABNORMAL T CELL PHENOTYPE IDENTIFIED.      07/30/2016 Pathology Results    Peripheral Blood Flow Cytometry - NO MONOCLONAL B-CELL POPULATION OR ABNORMAL T-CELL PHENOTYPE IDENTIFIED.      07/30/2016 -  Chemotherapy    The patient had DOXOrubicin (ADRIAMYCIN) chemo injection 78 mg, 50 mg/m2 = 78 mg, Intravenous,  Once, 2 of 6 cycles  palonosetron (ALOXI) injection 0.25 mg, 0.25 mg, Intravenous,  Once, 2 of 6 cycles  pegfilgrastim (NEULASTA ONPRO KIT) injection 6 mg, 6 mg, Subcutaneous, Once, 2 of 6 cycles  vinCRIStine (ONCOVIN) 2 mg in sodium chloride 0.9 % 50 mL chemo infusion, 2 mg,  Intravenous,  Once, 2 of 6 cycles  riTUXimab (RITUXAN) 600 mg in sodium chloride 0.9 % 250 mL (1.9355 mg/mL) chemo infusion, 375 mg/m2 = 600 mg, Intravenous,  Once, 2 of 6 cycles  cyclophosphamide (CYTOXAN) 1,180 mg in sodium chloride 0.9 % 250 mL chemo infusion, 750 mg/m2 = 1,180 mg, Intravenous,  Once, 2 of 6 cycles  for chemotherapy treatment.        08/03/2016 Pathology Results    Cytogenetic lab results [facility]). Cytogenetic Analysis: Normal.       09/23/2016 PET scan    1. Significantly improved appearance, with resolution of prior hypermetabolic activity in all of the previously involved lymph nodes. All lymph nodes are currently within normal size limits. 2. Other imaging findings of potential clinical significance: Chronic right sphenoid sinusitis. Right mastoid effusion. Coronary, aortic arch, and branch vessel atherosclerotic vascular disease. Severe emphysema. Aortoiliac atherosclerotic vascular disease.       Gabriella Love presents to the clinic today accompanied by her husband and daughter for cycle 6 of R-CHOP.  Patient is doing well except that she has numbness in her fingertips up to her first phalanges in both hands. She denies any neuropathy in her feet. Otherwise she feels well today. She has been eating well, and sleeping well.   Review of Systems  Constitutional: Negative for chills and fever.  HENT: Negative.  Negative for hearing loss, sore throat and tinnitus.   Eyes: Negative.  Negative for blurred vision, photophobia and discharge.  Respiratory: Negative.  Negative for cough, hemoptysis, shortness of breath and wheezing.   Cardiovascular: Negative.  Negative for chest pain, palpitations, orthopnea, claudication and leg swelling.  Gastrointestinal: Negative for abdominal pain, constipation, diarrhea, melena, nausea and vomiting.  Genitourinary: Negative.  Negative for dysuria and hematuria.  Musculoskeletal: Negative.  Negative for back pain, joint pain and  myalgias.  Skin: Negative.  Negative for itching and rash.  Neurological: Negative for dizziness, focal weakness, weakness and headaches.       Numbness in fingertips  Endo/Heme/Allergies: Negative.  Negative for environmental allergies and polydipsia. Does not bruise/bleed easily.  Psychiatric/Behavioral: Negative.  Negative for depression. The patient is not nervous/anxious and does not have insomnia.   All other systems reviewed and are negative.   Past Medical History:  Diagnosis Date  . Anxiety   . Arthritis   . DLBCL (diffuse large B cell lymphoma) ([place]) 07/16/2016  . GERD (gastroesophageal reflux disease)   . Heart disease   . Hyperlipidemia   . Hypertension   . Myocardial infarction ([place]) 12/19/2009   released from cardiology    Past Surgical History:  Procedure Laterality Date  . CARDIAC CATHETERIZATION     cardiac stent  . CHOLECYSTECTOMY    . CORONARY STENT PLACEMENT  2011  . INGUINAL LYMPH NODE BIOPSY Left 07/13/2016   Procedure: EXCISIONAL BIOPSY OF LEFT INGUINAL LYMPH NODE;  Surgeon: Vickie Epley, MD;  Location: [facility];  Service: General;  Laterality: Left;  . PORTACATH PLACEMENT N/A 07/17/2016   Procedure: INSERTION OF TUNNELED CENTRAL VENOUS CATHETER WITH SUBCUTANEOUS PORT;  Surgeon: Vickie Epley, MD;  Location: [facility];  Service: Vascular;  Laterality: N/A;  . TUBAL LIGATION      Family History  Problem Relation Age of Onset  . Cancer Mother     colon  . Depression Mother   . Heart disease Father   . Depression Sister   .  Cancer Sister     leukemia  . Hyperlipidemia Brother   . Hypertension Brother     Social History   Social History  . Marital status: Single    Spouse name: N/A  . Number of children: N/A  . Years of education: N/A   Social History Main Topics  . Smoking status: Current Every Day Smoker    Packs/day: 0.50    Years: 40.00    Types: Cigarettes  . Smokeless tobacco: Never Used  . Alcohol use No  . Drug use: No  .  Sexual activity: Yes    Birth control/ protection: Post-menopausal   Other Topics Concern  . None   Social History Narrative  . None     PHYSICAL EXAMINATION  ECOG PERFORMANCE STATUS: 1 - Symptomatic but completely ambulatory    Vitals with BMI 10/01/2016  Height   Weight 110 lbs  BMI   Systolic 803  Diastolic 85  Pulse 212  Respirations 20    Physical Exam  Constitutional: She is oriented to person, place, and time and well-developed, well-nourished, and in no distress. No distress.  Physical exam was performed in a treatment bed.   HENT:  Head: Normocephalic and atraumatic.  Mouth/Throat: No oropharyngeal exudate.  Eyes: Conjunctivae and EOM are normal. Pupils are equal, round, and reactive to light. No scleral icterus.  Neck: Normal range of motion. Neck supple. No JVD present.  Cardiovascular: Normal rate, regular rhythm and normal heart sounds.  Exam reveals no gallop and no friction rub.   No murmur heard. Pulmonary/Chest: Effort normal and breath sounds normal. No respiratory distress. She has no wheezes. She has no rales.  Abdominal: Soft. Bowel sounds are normal. She exhibits no distension. There is no tenderness. There is no guarding.  Musculoskeletal: Normal range of motion. She exhibits no edema or tenderness.  Lymphadenopathy:    She has no cervical adenopathy.  Neurological: She is alert and oriented to person, place, and time. No cranial nerve deficit. Gait normal.  Skin: Skin is warm and dry. No rash noted. No erythema. No pallor.  Psychiatric: Affect and judgment normal.  Nursing note and vitals reviewed.    LABORATORY DATA: CBC    Component Value Date/Time   WBC 7.8 11/11/2016 1340   RBC 3.63 (L) 11/11/2016 1340   HGB 12.4 11/11/2016 1340   HCT 37.0 11/11/2016 1340   PLT 296 11/11/2016 1340   MCV 101.9 (H) 11/11/2016 1340   MCH 34.2 (H) 11/11/2016 1340   MCHC 33.5 11/11/2016 1340   RDW 16.4 (H) 11/11/2016 1340   LYMPHSABS 0.6 (L) 11/11/2016  1340   MONOABS 1.1 (H) 11/11/2016 1340   EOSABS 0.0 11/11/2016 1340   BASOSABS 0.1 11/11/2016 1340      Chemistry      Component Value Date/Time   NA 138 11/11/2016 1340   K 3.5 11/11/2016 1340   CL 103 11/11/2016 1340   CO2 26 11/11/2016 1340   BUN 9 11/11/2016 1340   CREATININE 0.77 11/11/2016 1340      Component Value Date/Time   CALCIUM 9.3 11/11/2016 1340   ALKPHOS 57 11/11/2016 1340   AST 20 11/11/2016 1340   ALT 17 11/11/2016 1340   BILITOT 0.4 11/11/2016 1340     Lab Results  Component Value Date   CALCIUM 9.3 11/11/2016   PHOS 4.1 11/11/2016    Lab Results  Component Value Date   LABURIC 2.9 11/11/2016     PENDING LABS:   RADIOGRAPHIC STUDIES: I  have personally reviewed the radiological images as listed and agreed with the findings in the report.  NUCLEAR MEDICINE PET SKULL BASE TO THIGH 09/23/2016  IMPRESSION: 1. Significantly improved appearance, with resolution of prior hypermetabolic activity in all of the previously involved lymph nodes. All lymph nodes are currently within normal size limits. 2. Other imaging findings of potential clinical significance: Chronic right sphenoid sinusitis. Right mastoid effusion. Coronary, aortic arch, and branch vessel atherosclerotic vascular disease. Severe emphysema. Aortoiliac atherosclerotic vascular disease.  PATHOLOGY:     ASSESSMENT AND PLAN:  1. Stage III DLBCL, R-CHOP beginning on 07/30/2016. Patient had a great response after 3 cycles of RCHOP. Restaging PET-CT 09/23/16 demonstrated significantly improved appearance, with resolution of prior hypermetabolic activity in all of the previously involved lymph nodes. All lymph nodes are currently within normal size limits.  Plan: Proceed with cycle 6 of RCHOP today. This will be her last cycle. Plan to repeat restaging PET-CT in 4 weeks. Prescribed gabapentin 37m PO TID for her neuropathy. RTC in 4 weeks for follow up and review of PET scan.   THERAPY  PLAN:  Cycle 6 of RCHOP today.   All questions were answered. The patient knows to call the clinic with any problems, questions or concerns. We can certainly see the patient much sooner if necessary.   This note is electronically signed by: LTwana First MD 11/12/2016 10:48 AM

## 2016-11-12 NOTE — Patient Instructions (Signed)
[place] at [facility] Discharge Instructions  RECOMMENDATIONS MADE BY THE CONSULTANT AND ANY TEST RESULTS WILL BE SENT TO YOUR REFERRING PHYSICIAN.  You were seen today by Dr. Evette Georges flush in 4 weeks, then every 6 weeks We will schedule your PET scan A prescription for gabapentin has been sent in to the pharmacy Follow up in 4 weeks with lab work See Amy up front for appointments   Thank you for choosing [place] at [facility] to provide your oncology and hematology care.  To afford each patient quality time with our provider, please arrive at least 15 minutes before your scheduled appointment time.    If you have a lab appointment with the [facility] please come in thru the  Main Entrance and check in at the main information desk  You need to re-schedule your appointment should you arrive 10 or more minutes late.  We strive to give you quality time with our providers, and arriving late affects you and other patients whose appointments are after yours.  Also, if you no show three or more times for appointments you may be dismissed from the clinic at the providers discretion.     Again, thank you for choosing [facility].  Our hope is that these requests will decrease the amount of time that you wait before being seen by our physicians.       _____________________________________________________________  Should you have questions after your visit to [facility], please contact our office at [phone] between the hours of 8:30 a.m. and 4:30 p.m.  Voicemails left after 4:30 p.m. will not be returned until the following business day.  For prescription refill requests, have your pharmacy contact our office.       Resources For Cancer Patients and their Caregivers ? American Cancer Society: Can assist with transportation, wigs, general needs, runs Look Good Feel Better.         202-308-9070 ? Cancer Care: Provides financial assistance, online support groups, medication/co-pay assistance.  1-800-813-HOPE 219-870-9747) ? [place] Assists [place] cancer patients and their families through emotional , educational and financial support.  502-581-0635 ? [place] DSS Where to apply for food stamps, Medicaid and utility assistance. 204-302-8053 ? RCATS: Transportation to medical appointments. (575)596-4672 ? Social Security Administration: May apply for disability if have a Stage IV cancer. 681-555-6107 8501349962 ? [employer], Disability and Transit Services: Assists with nutrition, care and transit needs. [place] Support Programs: @10RELATIVEDAYS @ > Cancer Support Group  2nd Tuesday of the month 1pm-2pm, Journey Room  > Creative Journey  3rd Tuesday of the month 1130am-1pm, Journey Room  > Look Good Feel Better  1st Wednesday of the month 10am-12 noon, Journey Room (Call [place] to register [phone])

## 2016-11-12 NOTE — Patient Instructions (Signed)
[facility] Discharge Instructions for Patients Receiving Chemotherapy   Beginning January 23rd 2017 lab work for the [facility] will be done in the  Main lab at [facility] on 1st floor. If you have a lab appointment with the [facility] please come in thru the  Main Entrance and check in at the main information desk   Today you received the following chemotherapy agents:  Rituxan, Adriamycin, Vincristine, Cytoxan.   If you develop nausea and vomiting, or diarrhea that is not controlled by your medication, call the clinic.  The clinic phone number is [phone]. Office hours are Monday-Friday 8:30am-5:00pm.  BELOW ARE SYMPTOMS THAT SHOULD BE REPORTED IMMEDIATELY:  *FEVER GREATER THAN 101.0 F  *CHILLS WITH OR WITHOUT FEVER  NAUSEA AND VOMITING THAT IS NOT CONTROLLED WITH YOUR NAUSEA MEDICATION  *UNUSUAL SHORTNESS OF BREATH  *UNUSUAL BRUISING OR BLEEDING  TENDERNESS IN MOUTH AND THROAT WITH OR WITHOUT PRESENCE OF ULCERS  *URINARY PROBLEMS  *BOWEL PROBLEMS  UNUSUAL RASH Items with * indicate a potential emergency and should be followed up as soon as possible. If you have an emergency after office hours please contact your primary care physician or go to the nearest emergency department.  Please call the clinic during office hours if you have any questions or concerns.   You may also contact the Patient Navigator at [phone] should you have any questions or need assistance in obtaining follow up care.      Resources For Cancer Patients and their Caregivers ? American Cancer Society: Can assist with transportation, wigs, general needs, runs Look Good Feel Better.        272 505 5575 ? Cancer Care: Provides financial assistance, online support groups, medication/co-pay assistance.  1-800-813-HOPE 830-207-4734) ? [place] Assists [place] cancer patients and their families through emotional , educational and  financial support.  (838)534-5759 ? [place] DSS Where to apply for food stamps, Medicaid and utility assistance. 308-282-7675 ? RCATS: Transportation to medical appointments. (435)765-9363 ? Social Security Administration: May apply for disability if have a Stage IV cancer. 236-289-7237 (901)157-4241 ? [employer], Disability and Transit Services: Assists with nutrition, care and transit needs. [phone]

## 2016-11-12 NOTE — Progress Notes (Signed)
Tolerated tx w/o adverse reaction.  Alert, in no distress.  VSS.  Discharged ambulatory in c/o spouse.  

## 2016-11-24 ENCOUNTER — Telehealth (HOSPITAL_COMMUNITY): Payer: Self-pay | Admitting: Emergency Medicine

## 2016-11-24 NOTE — Telephone Encounter (Signed)
Pt called and stated that her neuropathy feels like it is getting worse.  Her last treatment was 2 weeks ago.  She started gabapentin 2 weeks ago.  She take 300 mg TID.  The neuropathy is going to take time to get better and to continue taking the gabapentin.  I spoke with Kirby Crigler PA and he told her to increase to 1200 mg total a day.  She can add that extra pill to any time of the day she would like.  She verbalized understanding.

## 2016-12-03 ENCOUNTER — Other Ambulatory Visit (HOSPITAL_COMMUNITY): Payer: Self-pay | Admitting: Emergency Medicine

## 2016-12-11 ENCOUNTER — Ambulatory Visit (HOSPITAL_COMMUNITY)
Admission: RE | Admit: 2016-12-11 | Discharge: 2016-12-11 | Disposition: A | Discharge status: Home / Self Care | Payer: Self-pay | Source: Ambulatory Visit | Attending: Oncology | Admitting: Oncology

## 2016-12-11 DIAGNOSIS — I2510 Atherosclerotic heart disease of native coronary artery without angina pectoris: Secondary | ICD-10-CM | POA: Insufficient documentation

## 2016-12-11 DIAGNOSIS — C8330 Diffuse large B-cell lymphoma, unspecified site: Secondary | ICD-10-CM | POA: Insufficient documentation

## 2016-12-11 DIAGNOSIS — J432 Centrilobular emphysema: Secondary | ICD-10-CM | POA: Insufficient documentation

## 2016-12-11 LAB — GLUCOSE, CAPILLARY: GLUCOSE-CAPILLARY: 110 mg/dL — AB (ref 65–99)

## 2016-12-11 MED ORDER — FLUDEOXYGLUCOSE F - 18 (FDG) INJECTION
5.1600 | Freq: Once | INTRAVENOUS | Status: AC | PRN
  Administered 2016-12-11: 5.16 via INTRAVENOUS

## 2016-12-14 ENCOUNTER — Encounter (HOSPITAL_COMMUNITY): Payer: Self-pay | Attending: Hematology & Oncology | Admitting: Oncology

## 2016-12-14 ENCOUNTER — Encounter (HOSPITAL_COMMUNITY): Payer: Self-pay | Admitting: Oncology

## 2016-12-14 ENCOUNTER — Encounter (HOSPITAL_BASED_OUTPATIENT_CLINIC_OR_DEPARTMENT_OTHER): Payer: Self-pay

## 2016-12-14 VITALS — BP 122/62 | HR 104 | Temp 98.3°F | Resp 18 | Wt 100.2 lb

## 2016-12-14 DIAGNOSIS — C8330 Diffuse large B-cell lymphoma, unspecified site: Secondary | ICD-10-CM

## 2016-12-14 DIAGNOSIS — C8335 Diffuse large B-cell lymphoma, lymph nodes of inguinal region and lower limb: Secondary | ICD-10-CM | POA: Insufficient documentation

## 2016-12-14 DIAGNOSIS — Z7982 Long term (current) use of aspirin: Secondary | ICD-10-CM | POA: Insufficient documentation

## 2016-12-14 DIAGNOSIS — Z95828 Presence of other vascular implants and grafts: Secondary | ICD-10-CM

## 2016-12-14 DIAGNOSIS — I252 Old myocardial infarction: Secondary | ICD-10-CM | POA: Insufficient documentation

## 2016-12-14 DIAGNOSIS — Z79899 Other long term (current) drug therapy: Secondary | ICD-10-CM | POA: Insufficient documentation

## 2016-12-14 DIAGNOSIS — F17210 Nicotine dependence, cigarettes, uncomplicated: Secondary | ICD-10-CM | POA: Insufficient documentation

## 2016-12-14 DIAGNOSIS — Z880 Allergy status to penicillin: Secondary | ICD-10-CM | POA: Insufficient documentation

## 2016-12-14 LAB — COMPREHENSIVE METABOLIC PANEL
ALK PHOS: 54 U/L (ref 38–126)
ALT: 13 U/L — AB (ref 14–54)
AST: 25 U/L (ref 15–41)
Albumin: 3.8 g/dL (ref 3.5–5.0)
Anion gap: 9 (ref 5–15)
BILIRUBIN TOTAL: 0.5 mg/dL (ref 0.3–1.2)
BUN: 6 mg/dL (ref 6–20)
CALCIUM: 9.4 mg/dL (ref 8.9–10.3)
CO2: 26 mmol/L (ref 22–32)
CREATININE: 0.72 mg/dL (ref 0.44–1.00)
Chloride: 104 mmol/L (ref 101–111)
Glucose, Bld: 118 mg/dL — ABNORMAL HIGH (ref 65–99)
Potassium: 3.2 mmol/L — ABNORMAL LOW (ref 3.5–5.1)
Sodium: 139 mmol/L (ref 135–145)
TOTAL PROTEIN: 6.3 g/dL — AB (ref 6.5–8.1)

## 2016-12-14 LAB — CBC WITH DIFFERENTIAL/PLATELET
BASOS ABS: 0.1 10*3/uL (ref 0.0–0.1)
Basophils Relative: 1 %
EOS PCT: 2 %
Eosinophils Absolute: 0.2 10*3/uL (ref 0.0–0.7)
HEMATOCRIT: 37.9 % (ref 36.0–46.0)
Hemoglobin: 12.7 g/dL (ref 12.0–15.0)
LYMPHS ABS: 0.8 10*3/uL (ref 0.7–4.0)
LYMPHS PCT: 13 %
MCH: 34.5 pg — AB (ref 26.0–34.0)
MCHC: 33.5 g/dL (ref 30.0–36.0)
MCV: 103 fL — AB (ref 78.0–100.0)
Monocytes Absolute: 0.7 10*3/uL (ref 0.1–1.0)
Monocytes Relative: 11 %
NEUTROS ABS: 4.6 10*3/uL (ref 1.7–7.7)
Neutrophils Relative %: 73 %
Platelets: 193 10*3/uL (ref 150–400)
RBC: 3.68 MIL/uL — AB (ref 3.87–5.11)
RDW: 14.4 % (ref 11.5–15.5)
WBC: 6.3 10*3/uL (ref 4.0–10.5)

## 2016-12-14 LAB — LACTATE DEHYDROGENASE: LDH: 149 U/L (ref 98–192)

## 2016-12-14 MED ORDER — HEPARIN SOD (PORK) LOCK FLUSH 100 UNIT/ML IV SOLN
500.0000 [IU] | Freq: Once | INTRAVENOUS | Status: AC
  Administered 2016-12-14: 500 [IU] via INTRAVENOUS

## 2016-12-14 MED ORDER — SODIUM CHLORIDE 0.9% FLUSH
10.0000 mL | Freq: Once | INTRAVENOUS | Status: AC
  Administered 2016-12-14: 10 mL via INTRAVENOUS

## 2016-12-14 MED ORDER — HEPARIN SOD (PORK) LOCK FLUSH 100 UNIT/ML IV SOLN
INTRAVENOUS | Status: AC
  Filled 2016-12-14: qty 5

## 2016-12-14 NOTE — Patient Instructions (Addendum)
[name] at [facility] Discharge Instructions  RECOMMENDATIONS MADE BY THE CONSULTANT AND ANY TEST RESULTS WILL BE SENT TO YOUR REFERRING PHYSICIAN.  You were seen today by Dr. Twana First Continue port flush every 6-8 weeks Follow up in 3 months with lab work   Thank you for choosing [place] at [facility] to provide your oncology and hematology care.  To afford each patient quality time with our provider, please arrive at least 15 minutes before your scheduled appointment time.    If you have a lab appointment with the [facility] please come in thru the  Main Entrance and check in at the main information desk  You need to re-schedule your appointment should you arrive 10 or more minutes late.  We strive to give you quality time with our providers, and arriving late affects you and other patients whose appointments are after yours.  Also, if you no show three or more times for appointments you may be dismissed from the clinic at the providers discretion.     Again, thank you for choosing [facility].  Our hope is that these requests will decrease the amount of time that you wait before being seen by our physicians.       _____________________________________________________________  Should you have questions after your visit to [facility], please contact our office at [phone] between the hours of 8:30 a.m. and 4:30 p.m.  Voicemails left after 4:30 p.m. will not be returned until the following business day.  For prescription refill requests, have your pharmacy contact our office.       Resources For Cancer Patients and their Caregivers ? American Cancer Society: Can assist with transportation, wigs, general needs, runs Look Good Feel Better.        (564) 633-7699 ? Cancer Care: Provides financial assistance, online support groups, medication/co-pay assistance.  1-800-813-HOPE 6038256173) ? [place] Assists [place] cancer patients and their families through emotional , educational and financial support.  534-721-9316 ? [place] DSS Where to apply for food stamps, Medicaid and utility assistance. 212 540 3411 ? RCATS: Transportation to medical appointments. (715)681-6814 ? Social Security Administration: May apply for disability if have a Stage IV cancer. 901-737-9044 (640) 056-8937 ? [employer], Disability and Transit Services: Assists with nutrition, care and transit needs. [place] Support Programs: @10RELATIVEDAYS @ > Cancer Support Group  2nd Tuesday of the month 1pm-2pm, Journey Room  > Creative Journey  3rd Tuesday of the month 1130am-1pm, Journey Room  > Look Good Feel Better  1st Wednesday of the month 10am-12 noon, Journey Room (Call [place] to register [phone])

## 2016-12-14 NOTE — Progress Notes (Signed)
Gabriella Squibb, MD [place] [address]  Diffuse large B-cell lymphoma, unspecified body region [facility]) - Plan: CBC with Differential, Comprehensive metabolic panel, Lactate dehydrogenase  Progress Note  CURRENT THERAPY: R-CHOP beginning on 07/30/2016  INTERVAL HISTORY: Gabriella Love 61 y.o. female returns for followup of Stage III DLBCL, R-CHOP beginning on 07/30/2016.    DLBCL (diffuse large B cell lymphoma) ([place])   07/13/2016 Initial Biopsy    L inguinal biopsy with Dr. 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 Grade (if applicable): High grade. Flow cytometry: [id number] reveals a monoclonal B-cell population with expression of CD10. Immunohistochemical stains: CD20, CD3, CD10, bcl-2, bcl-6, CD21, CD23, Ki-67. Touch preps/imprints: Mixture of large and smaller lymphocytes with irregular nuclear contours. Comments: Sections of lymph node reveal effacement of the architecture by a nodular proliferation of neoplastic appearing follicles. In many of the areas the follicles coalesce and form sheets of large cells. The more formed follicles predominately consist of numerous large centroblasts (>15 per hpf), representing a high grade (3B) follicular component. The diffuse areas are also composed of large cells but lack follicular dendritic networks (CD21, CD23). Immunohistochemistry reveals the atypical lymphocytes are positive for CD20, CD10, and bcl-6. They are negative for bcl-2. Ki-67 is elevated (30% overall). CD3 reveals residual surrounding T-cells. Flow cytometry ([id number]) reveals a monoclonal B-cell population with expression of CD10. Overall, these findings are consistent with a diffuse large B-cell lymphoma arising from a follicular  lymphoma. The case was called to Dr. Rosana Hoes on 07/16/2016.      07/23/2016 Echocardiogram    Left ventricle: Systolic function was normal. The estimated ejection fraction was in the range of 50% to 55%. Doppler parameters are consistent with abnormal left ventricular relaxation (grade 1 diastolic dysfunction).      07/24/2016 Procedure    Technically successful CT guided right iliac bone core and aspiration biopsy by IR.      07/28/2016 PET scan    1. Hypermetabolic lymphadenopathy in the neck, abdomen and right inguinal area as described above, consistent with known lymphoma. 2. Focal area of hypermetabolism in the appendix could be lymphomatous involvement. Recommend attention on follow-up scans. 3. Age advanced atherosclerotic calcifications involving the abdominal aorta and branch vessels and age advanced three-vessel coronary artery calcifications. 4. No obvious osseous involvement. 5. Emphysematous changes and pulmonary scarring.      07/28/2016 Pathology Results    Bone Marrow, Aspirate,Biopsy, and Clot, right iliac - NORMOCELLULAR BONE MARROW WITH TRILINEAGE HEMATOPOIESIS. PERIPHERAL BLOOD: - OCCASIONAL CIRCULATING ATYPICAL LYMPHOID CELLS.      07/28/2016 Pathology Results    Bone Marrow Flow Cytometry - NO MONOCLONAL B CELL POPULATION OR ABNORMAL T CELL PHENOTYPE IDENTIFIED.      07/30/2016 Pathology Results    Peripheral Blood Flow Cytometry - NO MONOCLONAL B-CELL POPULATION OR ABNORMAL T-CELL PHENOTYPE IDENTIFIED.      07/30/2016 -  Chemotherapy    The patient had DOXOrubicin (ADRIAMYCIN) chemo injection 78 mg, 50 mg/m2 = 78 mg, Intravenous,  Once, 2 of 6 cycles  palonosetron (ALOXI) injection 0.25 mg, 0.25 mg, Intravenous,  Once, 2 of 6 cycles  pegfilgrastim (NEULASTA ONPRO KIT) injection 6 mg, 6 mg, Subcutaneous, Once, 2 of 6 cycles  vinCRIStine (ONCOVIN) 2 mg in sodium chloride  0.9 % 50 mL chemo infusion, 2 mg, Intravenous,  Once, 2 of 6 cycles  riTUXimab  (RITUXAN) 600 mg in sodium chloride 0.9 % 250 mL (1.9355 mg/mL) chemo infusion, 375 mg/m2 = 600 mg, Intravenous,  Once, 2 of 6 cycles  cyclophosphamide (CYTOXAN) 1,180 mg in sodium chloride 0.9 % 250 mL chemo infusion, 750 mg/m2 = 1,180 mg, Intravenous,  Once, 2 of 6 cycles  for chemotherapy treatment.        08/03/2016 Pathology Results    Cytogenetic lab results [facility]). Cytogenetic Analysis: Normal.       09/23/2016 PET scan    1. Significantly improved appearance, with resolution of prior hypermetabolic activity in all of the previously involved lymph nodes. All lymph nodes are currently within normal size limits. 2. Other imaging findings of potential clinical significance: Chronic right sphenoid sinusitis. Right mastoid effusion. Coronary, aortic arch, and branch vessel atherosclerotic vascular disease. Severe emphysema. Aortoiliac atherosclerotic vascular disease.      11/12/2016 -  Chemotherapy    Completed cycle 6 of RCHOP       12/11/2016 PET scan    1. No evidence of residual hypermetabolic lymphoma or adenopathy. 2. Age advanced coronary artery atherosclerosis. Recommend assessment of coronary risk factors and consideration of medical therapy. 3. Centrilobular emphysema.       Gabriella Love presents to the clinic today accompanied by her husband for follow-up and to review her recent PET scan.  Patient completed cycle 6 of our CHOP on 11/12/16. She states she has been doing well since then, except that she still feels that she is not back to her baseline prior to starting chemotherapy. She states she has days where she feels very fatigued. Otherwise she states she is eating well, her hair is slowly growing back. She denies any chest pain, shortness of breath, abdominal pain, focal weakness, fevers or chills.  Review of Systems  Constitutional: Negative for chills and fever.       Low energy  HENT: Negative.  Negative for hearing loss, sore throat and tinnitus.   Eyes:  Negative.  Negative for blurred vision, photophobia and discharge.  Respiratory: Negative.  Negative for cough, hemoptysis, shortness of breath and wheezing.   Cardiovascular: Negative.  Negative for chest pain, palpitations, orthopnea, claudication and leg swelling.  Gastrointestinal: Negative for abdominal pain, constipation, diarrhea, melena, nausea and vomiting.  Genitourinary: Negative.  Negative for dysuria and hematuria.  Musculoskeletal: Negative.  Negative for back pain, joint pain and myalgias.  Skin: Negative.  Negative for itching and rash.  Neurological: Negative for dizziness, focal weakness, weakness and headaches.       Numbness in fingertips  Endo/Heme/Allergies: Negative.  Negative for environmental allergies and polydipsia. Does not bruise/bleed easily.  Psychiatric/Behavioral: Negative.  Negative for depression. The patient is not nervous/anxious and does not have insomnia.   All other systems reviewed and are negative.   Past Medical History:  Diagnosis Date  . Anxiety   . Arthritis   . DLBCL (diffuse large B cell lymphoma) ([place]) 07/16/2016  . GERD (gastroesophageal reflux disease)   . Heart disease   . Hyperlipidemia   . Hypertension   . Myocardial infarction ([place]) 12/19/2009   released from cardiology    Past Surgical History:  Procedure Laterality Date  . CARDIAC CATHETERIZATION     cardiac stent  . CHOLECYSTECTOMY    . CORONARY STENT PLACEMENT  2011  . INGUINAL LYMPH NODE BIOPSY Left 07/13/2016   Procedure: EXCISIONAL BIOPSY OF LEFT INGUINAL LYMPH  NODE;  Surgeon: Vickie Epley, MD;  Location: [facility];  Service: General;  Laterality: Left;  . PORTACATH PLACEMENT N/A 07/17/2016   Procedure: INSERTION OF TUNNELED CENTRAL VENOUS CATHETER WITH SUBCUTANEOUS PORT;  Surgeon: Vickie Epley, MD;  Location: [facility];  Service: Vascular;  Laterality: N/A;  . TUBAL LIGATION      Family History  Problem Relation Age of Onset  . Cancer Mother        colon  .  Depression Mother   . Heart disease Father   . Depression Sister   . Cancer Sister        leukemia  . Hyperlipidemia Brother   . Hypertension Brother     Social History   Social History  . Marital status: Single    Spouse name: N/A  . Number of children: N/A  . Years of education: N/A   Social History Main Topics  . Smoking status: Current Every Day Smoker    Packs/day: 0.50    Years: 40.00    Types: Cigarettes  . Smokeless tobacco: Never Used  . Alcohol use No  . Drug use: No  . Sexual activity: Yes    Birth control/ protection: Post-menopausal   Other Topics Concern  . None   Social History Narrative  . None     PHYSICAL EXAMINATION  ECOG PERFORMANCE STATUS: 1 - Symptomatic but completely ambulatory    Vitals with BMI 10/01/2016  Height   Weight 110 lbs  BMI   Systolic 801  Diastolic 85  Pulse 655  Respirations 20    Physical Exam  Constitutional: She is oriented to person, place, and time and well-developed, well-nourished, and in no distress. No distress.  Physical exam was performed in a treatment bed.   HENT:  Head: Normocephalic and atraumatic.  Mouth/Throat: No oropharyngeal exudate.  Eyes: Conjunctivae and EOM are normal. Pupils are equal, round, and reactive to light. No scleral icterus.  Neck: Normal range of motion. Neck supple. No JVD present.  Cardiovascular: Normal rate, regular rhythm and normal heart sounds.  Exam reveals no gallop and no friction rub.   No murmur heard. Pulmonary/Chest: Effort normal and breath sounds normal. No respiratory distress. She has no wheezes. She has no rales.  Abdominal: Soft. Bowel sounds are normal. She exhibits no distension. There is no tenderness. There is no guarding.  Musculoskeletal: Normal range of motion. She exhibits no edema or tenderness.  Lymphadenopathy:    She has no cervical adenopathy.  Neurological: She is alert and oriented to person, place, and time. No cranial nerve deficit. Gait  normal.  Skin: Skin is warm and dry. No rash noted. No erythema. No pallor.  Psychiatric: Affect and judgment normal.  Nursing note and vitals reviewed.    LABORATORY DATA: CBC    Component Value Date/Time   WBC 6.3 12/14/2016 0906   RBC 3.68 (L) 12/14/2016 0906   HGB 12.7 12/14/2016 0906   HCT 37.9 12/14/2016 0906   PLT 193 12/14/2016 0906   MCV 103.0 (H) 12/14/2016 0906   MCH 34.5 (H) 12/14/2016 0906   MCHC 33.5 12/14/2016 0906   RDW 14.4 12/14/2016 0906   LYMPHSABS 0.8 12/14/2016 0906   MONOABS 0.7 12/14/2016 0906   EOSABS 0.2 12/14/2016 0906   BASOSABS 0.1 12/14/2016 0906      Chemistry      Component Value Date/Time   NA 139 12/14/2016 0906   K 3.2 (L) 12/14/2016 0906   CL 104 12/14/2016 0906  CO2 26 12/14/2016 0906   BUN 6 12/14/2016 0906   CREATININE 0.72 12/14/2016 0906      Component Value Date/Time   CALCIUM 9.4 12/14/2016 0906   ALKPHOS 54 12/14/2016 0906   AST 25 12/14/2016 0906   ALT 13 (L) 12/14/2016 0906   BILITOT 0.5 12/14/2016 7482     Lab Results  Component Value Date   CALCIUM 9.4 12/14/2016   PHOS 4.1 11/11/2016    Lab Results  Component Value Date   LABURIC 2.9 11/11/2016     PENDING LABS:   RADIOGRAPHIC STUDIES: I have personally reviewed the radiological images as listed and agreed with the findings in the report.  NUCLEAR MEDICINE PET SKULL BASE TO THIGH 09/23/2016  IMPRESSION: 1. Significantly improved appearance, with resolution of prior hypermetabolic activity in all of the previously involved lymph nodes. All lymph nodes are currently within normal size limits. 2. Other imaging findings of potential clinical significance: Chronic right sphenoid sinusitis. Right mastoid effusion. Coronary, aortic arch, and branch vessel atherosclerotic vascular disease. Severe emphysema. Aortoiliac atherosclerotic vascular disease.  PATHOLOGY:     ASSESSMENT AND PLAN:  1. Stage III DLBCL, R-CHOP beginning on 07/30/2016. Patient  had a great response after 3 cycles of RCHOP. Restaging PET-CT 09/23/16 demonstrated significantly improved appearance, with resolution of prior hypermetabolic activity in all of the previously involved lymph nodes. All lymph nodes are currently within normal size limits. Received 6 cycles of RCHOP q21 days from 07/30/16 - 11/12/16.  Plan: I have reviewed patient's PET scan in detail with her and her husband. Patient has had complete response to chemotherapy and has no evidence of any hypermetabolic disease. I will plan to repeat her PET scan in 6 months, around November 2018. I have advised her that she has evidence of atherosclerotic disease cardiovascular disease on her PET scan and she showed follow up with her cardiologist. Burnis Medin continue surveillance at this time. She will come back for follow-up in 3 months with repeat CBC, CMP, LDH on her next visit. Patient is aware to come see me sooner should she develop any new symptoms. She'll get her port flushed every 6-8 weeks.  THERAPY PLAN:  NCCN guidelines recommends the follow surveillance for Stage I, II for those who asertain a complete response in the first-line treatment setting (3.2017):  A. H&P every 3-6 months for 5 years, then yearly or as clinically indicated.  B. Labs every 3-6 months for 5 years and then annually or as clinically indicated.  C. Imaging only as indicated. For those ascertaining a complete response in the Stage III, IV setting in first-line treatment setting:  A. H&P every 3-6 months for 5 years, then yearly or as clinically indicated.  B. Labs every 3-6 months for 5 years and then annually or as clinically indicated.  C. CT C/A/P with contrast no more than every 6 months for 2 years after  completion of treatment; then only as indicated.    All questions were answered. The patient knows to call the clinic with any problems, questions or concerns. We can certainly see the patient much sooner if necessary.   This note  is electronically signed by: Twana First, MD 12/14/2016 11:10 AM

## 2016-12-14 NOTE — Patient Instructions (Signed)
[facility] at [facility] Discharge Instructions  RECOMMENDATIONS MADE BY THE CONSULTANT AND ANY TEST RESULTS WILL BE SENT TO YOUR REFERRING PHYSICIAN.  Port flush with lab work today as ordered.  Thank you for choosing [facility] at [facility] to provide your oncology and hematology care.  To afford each patient quality time with our provider, please arrive at least 15 minutes before your scheduled appointment time.    If you have a lab appointment with the Cancer Center please come in thru the  Main Entrance and check in at the main information desk  You need to re-schedule your appointment should you arrive 10 or more minutes late.  We strive to give you quality time with our providers, and arriving late affects you and other patients whose appointments are after yours.  Also, if you no show three or more times for appointments you may be dismissed from the clinic at the providers discretion.     Again, thank you for choosing [facility].  Our hope is that these requests will decrease the amount of time that you wait before being seen by our physicians.       _____________________________________________________________  Should you have questions after your visit to [facility], please contact our office at [phone] between the hours of 8:30 a.m. and 4:30 p.m.  Voicemails left after 4:30 p.m. will not be returned until the following business day.  For prescription refill requests, have your pharmacy contact our office.       Resources For Cancer Patients and their Caregivers ? American Cancer Society: Can assist with transportation, wigs, general needs, runs Look Good Feel Better.        1-888-227-6333 ? Cancer Care: Provides financial assistance, online support groups, medication/co-pay assistance.  1-800-813-HOPE (4673) ? [facility] Assists [place] cancer patients and their  families through emotional , educational and financial support.  336-427-4357 ? [place] DSS Where to apply for food stamps, Medicaid and utility assistance. 336-342-1394 ? [facility]: Transportation to medical appointments. 336-347-2287 ? Social Security Administration: May apply for disability if have a Stage IV cancer. 336-342-7796 1-800-772-1213 ? [place] Aging, Disability and Transit Services: Assists with nutrition, care and transit needs. 336-349-2343  Cancer Center Support Programs: [email or web address]@ > Cancer Support Group  2nd Tuesday of the month 1pm-2pm, Journey Room  > Creative Journey  3rd Tuesday of the month 1130am-1pm, Journey Room  > Look Good Feel Better  1st Wednesday of the month 10am-12 noon, Journey Room (Call American Cancer Society to register 1-800-395-5775)   

## 2016-12-14 NOTE — Progress Notes (Signed)
Gabriella Love presented for Portacath access and flush. Proper placement of portacath confirmed by CXR. Portacath located right chest wall accessed with  H 20 needle. Good blood return present. Portacath flushed with 57ml NS and 500U/51ml Heparin and needle removed intact. Procedure without incident. Patient tolerated procedure well.

## 2017-01-08 ENCOUNTER — Telehealth (HOSPITAL_COMMUNITY): Payer: Self-pay | Admitting: Oncology

## 2017-01-08 ENCOUNTER — Telehealth (HOSPITAL_COMMUNITY): Payer: Self-pay | Admitting: Adult Health

## 2017-01-08 NOTE — Telephone Encounter (Signed)
FAXED [place] [facility] 3/29*4/19

## 2017-01-08 NOTE — Telephone Encounter (Signed)
FAXED [facility] REQ TO [place] [facility] 1/4-4/19

## 2017-01-25 ENCOUNTER — Encounter (HOSPITAL_COMMUNITY): Payer: Self-pay | Attending: Oncology

## 2017-01-25 VITALS — BP 139/76 | HR 98 | Temp 98.0°F | Resp 16

## 2017-01-25 DIAGNOSIS — Z95828 Presence of other vascular implants and grafts: Secondary | ICD-10-CM

## 2017-01-25 DIAGNOSIS — C8330 Diffuse large B-cell lymphoma, unspecified site: Secondary | ICD-10-CM

## 2017-01-25 DIAGNOSIS — Z452 Encounter for adjustment and management of vascular access device: Secondary | ICD-10-CM

## 2017-01-25 MED ORDER — SODIUM CHLORIDE 0.9% FLUSH
10.0000 mL | Freq: Once | INTRAVENOUS | Status: AC
  Administered 2017-01-25: 10 mL via INTRAVENOUS

## 2017-01-25 MED ORDER — HEPARIN SOD (PORK) LOCK FLUSH 100 UNIT/ML IV SOLN
500.0000 [IU] | Freq: Once | INTRAVENOUS | Status: AC
Start: 2017-01-25 — End: 2017-01-25
  Administered 2017-01-25: 500 [IU] via INTRAVENOUS

## 2017-01-25 MED ORDER — HEPARIN SOD (PORK) LOCK FLUSH 100 UNIT/ML IV SOLN
INTRAVENOUS | Status: AC
  Filled 2017-01-25: qty 5

## 2017-01-25 NOTE — Patient Instructions (Signed)
[facility] at [facility] Discharge Instructions  RECOMMENDATIONS MADE BY THE CONSULTANT AND ANY TEST RESULTS WILL BE SENT TO YOUR REFERRING PHYSICIAN.  Port flush today as scheduled. Return as scheduled.  Thank you for choosing [facility] at [facility] to provide your oncology and hematology care.  To afford each patient quality time with our provider, please arrive at least 15 minutes before your scheduled appointment time.    If you have a lab appointment with the Cancer Center please come in thru the  Main Entrance and check in at the main information desk  You need to re-schedule your appointment should you arrive 10 or more minutes late.  We strive to give you quality time with our providers, and arriving late affects you and other patients whose appointments are after yours.  Also, if you no show three or more times for appointments you may be dismissed from the clinic at the providers discretion.     Again, thank you for choosing [facility].  Our hope is that these requests will decrease the amount of time that you wait before being seen by our physicians.       _____________________________________________________________  Should you have questions after your visit to [facility], please contact our office at [phone] between the hours of 8:30 a.m. and 4:30 p.m.  Voicemails left after 4:30 p.m. will not be returned until the following business day.  For prescription refill requests, have your pharmacy contact our office.       Resources For Cancer Patients and their Caregivers ? American Cancer Society: Can assist with transportation, wigs, general needs, runs Look Good Feel Better.        1-888-227-6333 ? Cancer Care: Provides financial assistance, online support groups, medication/co-pay assistance.  1-800-813-HOPE (4673) ? [facility] Assists [place] cancer patients  and their families through emotional , educational and financial support.  336-427-4357 ? [place] DSS Where to apply for food stamps, Medicaid and utility assistance. 336-342-1394 ? [facility]: Transportation to medical appointments. 336-347-2287 ? Social Security Administration: May apply for disability if have a Stage IV cancer. 336-342-7796 1-800-772-1213 ? [place] Aging, Disability and Transit Services: Assists with nutrition, care and transit needs. 336-349-2343  Cancer Center Support Programs: @[email or web address]@ > Cancer Support Group  2nd Tuesday of the month 1pm-2pm, Journey Room  > Creative Journey  3rd Tuesday of the month 1130am-1pm, Journey Room  > Look Good Feel Better  1st Wednesday of the month 10am-12 noon, Journey Room (Call American Cancer Society to register 1-800-395-5775)   

## 2017-01-25 NOTE — Progress Notes (Signed)
Gabriella Love presented for Portacath access and flush. Proper placement of portacath confirmed by CXR. Portacath located right chest wall accessed with  H 20 needle. Good blood return present. Portacath flushed with 20ml NS and 500U/5ml Heparin and needle removed intact. Procedure without incident. Patient tolerated procedure well.  Stable and ambulatory on discharge home to self. 

## 2017-03-17 ENCOUNTER — Encounter (HOSPITAL_COMMUNITY): Payer: Self-pay | Attending: Oncology

## 2017-03-17 ENCOUNTER — Encounter (HOSPITAL_COMMUNITY): Payer: Self-pay

## 2017-03-17 ENCOUNTER — Encounter (HOSPITAL_COMMUNITY): Payer: Self-pay | Attending: Oncology | Admitting: Oncology

## 2017-03-17 VITALS — BP 145/81 | HR 94 | Temp 97.8°F | Resp 18 | Wt 98.4 lb

## 2017-03-17 DIAGNOSIS — C8335 Diffuse large B-cell lymphoma, lymph nodes of inguinal region and lower limb: Secondary | ICD-10-CM

## 2017-03-17 DIAGNOSIS — C8330 Diffuse large B-cell lymphoma, unspecified site: Secondary | ICD-10-CM

## 2017-03-17 DIAGNOSIS — Z95828 Presence of other vascular implants and grafts: Secondary | ICD-10-CM

## 2017-03-17 LAB — COMPREHENSIVE METABOLIC PANEL
ALBUMIN: 4.4 g/dL (ref 3.5–5.0)
ALT: 22 U/L (ref 14–54)
AST: 22 U/L (ref 15–41)
Alkaline Phosphatase: 96 U/L (ref 38–126)
Anion gap: 9 (ref 5–15)
BUN: 11 mg/dL (ref 6–20)
CALCIUM: 9.9 mg/dL (ref 8.9–10.3)
CO2: 27 mmol/L (ref 22–32)
CREATININE: 0.64 mg/dL (ref 0.44–1.00)
Chloride: 99 mmol/L — ABNORMAL LOW (ref 101–111)
Glucose, Bld: 110 mg/dL — ABNORMAL HIGH (ref 65–99)
Potassium: 4.2 mmol/L (ref 3.5–5.1)
SODIUM: 135 mmol/L (ref 135–145)
Total Bilirubin: 0.5 mg/dL (ref 0.3–1.2)
Total Protein: 6.9 g/dL (ref 6.5–8.1)

## 2017-03-17 LAB — CBC WITH DIFFERENTIAL/PLATELET
Basophils Absolute: 0 10*3/uL (ref 0.0–0.1)
Basophils Relative: 1 %
EOS PCT: 2 %
Eosinophils Absolute: 0.1 10*3/uL (ref 0.0–0.7)
HEMATOCRIT: 43.8 % (ref 36.0–46.0)
HEMOGLOBIN: 15 g/dL (ref 12.0–15.0)
LYMPHS ABS: 1 10*3/uL (ref 0.7–4.0)
LYMPHS PCT: 17 %
MCH: 32.4 pg (ref 26.0–34.0)
MCHC: 34.2 g/dL (ref 30.0–36.0)
MCV: 94.6 fL (ref 78.0–100.0)
Monocytes Absolute: 0.7 10*3/uL (ref 0.1–1.0)
Monocytes Relative: 12 %
NEUTROS ABS: 4.1 10*3/uL (ref 1.7–7.7)
Neutrophils Relative %: 68 %
PLATELETS: 201 10*3/uL (ref 150–400)
RBC: 4.63 MIL/uL (ref 3.87–5.11)
RDW: 13.3 % (ref 11.5–15.5)
WBC: 5.9 10*3/uL (ref 4.0–10.5)

## 2017-03-17 LAB — LACTATE DEHYDROGENASE: LDH: 139 U/L (ref 98–192)

## 2017-03-17 MED ORDER — HEPARIN SOD (PORK) LOCK FLUSH 100 UNIT/ML IV SOLN
500.0000 [IU] | Freq: Once | INTRAVENOUS | Status: AC
  Administered 2017-03-17: 500 [IU] via INTRAVENOUS
  Filled 2017-03-17: qty 5

## 2017-03-17 MED ORDER — SODIUM CHLORIDE 0.9% FLUSH
10.0000 mL | Freq: Once | INTRAVENOUS | Status: AC
  Administered 2017-03-17: 10 mL via INTRAVENOUS

## 2017-03-17 NOTE — Progress Notes (Signed)
Gabriella Squibb, MD [place] [address]  Diffuse large B-cell lymphoma, unspecified body region [facility]) - Plan: NM PET Image Restag (PS) Skull Base To Thigh, CBC with Differential, Comprehensive metabolic panel, Lactate dehydrogenase, Lipid panel  Progress Note  CURRENT THERAPY: R-CHOP beginning on 07/30/2016  INTERVAL HISTORY: Gabriella Love 61 y.o. female returns for followup of Stage III DLBCL, R-CHOP beginning on 07/30/2016.    DLBCL (diffuse large B cell lymphoma) ([place])   07/13/2016 Initial Biopsy    L inguinal biopsy with Dr. 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 Grade (if applicable): High grade. Flow cytometry: [id number] reveals a monoclonal B-cell population with expression of CD10. Immunohistochemical stains: CD20, CD3, CD10, bcl-2, bcl-6, CD21, CD23, Ki-67. Touch preps/imprints: Mixture of large and smaller lymphocytes with irregular nuclear contours. Comments: Sections of lymph node reveal effacement of the architecture by a nodular proliferation of neoplastic appearing follicles. In many of the areas the follicles coalesce and form sheets of large cells. The more formed follicles predominately consist of numerous large centroblasts (>15 per hpf), representing a high grade (3B) follicular component. The diffuse areas are also composed of large cells but lack follicular dendritic networks (CD21, CD23). Immunohistochemistry reveals the atypical lymphocytes are positive for CD20, CD10, and bcl-6. They are negative for bcl-2. Ki-67 is elevated (30% overall). CD3 reveals residual surrounding T-cells. Flow cytometry ([id number]) reveals a monoclonal B-cell population with expression of CD10. 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 The case was called to Dr. Rosana Hoes on 07/16/2016.      07/23/2016 Echocardiogram    Left ventricle: Systolic function was normal. The estimated ejection fraction was in the range of 50% to 55%. Doppler parameters are consistent with abnormal left ventricular relaxation (grade 1 diastolic dysfunction).      07/24/2016 Procedure    Technically successful CT guided right iliac bone core and aspiration biopsy by IR.      07/28/2016 PET scan    1. Hypermetabolic lymphadenopathy in the neck, abdomen and right inguinal area as described above, consistent with known lymphoma. 2. Focal area of hypermetabolism in the appendix could be lymphomatous involvement. Recommend attention on follow-up scans. 3. Age advanced atherosclerotic calcifications involving the abdominal aorta and branch vessels and age advanced three-vessel coronary artery calcifications. 4. No obvious osseous involvement. 5. Emphysematous changes and pulmonary scarring.      07/28/2016 Pathology Results    Bone Marrow, Aspirate,Biopsy, and Clot, right iliac - NORMOCELLULAR BONE MARROW WITH TRILINEAGE HEMATOPOIESIS. PERIPHERAL BLOOD: - OCCASIONAL CIRCULATING ATYPICAL LYMPHOID CELLS.      07/28/2016 Pathology Results    Bone Marrow Flow Cytometry - NO MONOCLONAL B CELL POPULATION OR ABNORMAL T CELL PHENOTYPE IDENTIFIED.      07/30/2016 Pathology Results    Peripheral Blood Flow Cytometry - NO MONOCLONAL B-CELL POPULATION OR ABNORMAL T-CELL PHENOTYPE IDENTIFIED.      07/30/2016 -  Chemotherapy    The patient had DOXOrubicin (ADRIAMYCIN) chemo injection 78 mg, 50 mg/m2 = 78 mg, Intravenous,  Once, 2 of 6 cycles  palonosetron (ALOXI) injection 0.25 mg, 0.25 mg, Intravenous,  Once, 2 of 6 cycles  pegfilgrastim (NEULASTA ONPRO KIT) injection 6 mg, 6 mg, Subcutaneous, Once, 2  of 6 cycles  vinCRIStine (ONCOVIN) 2 mg in sodium chloride 0.9 % 50 mL chemo  infusion, 2 mg, Intravenous,  Once, 2 of 6 cycles  riTUXimab (RITUXAN) 600 mg in sodium chloride 0.9 % 250 mL (1.9355 mg/mL) chemo infusion, 375 mg/m2 = 600 mg, Intravenous,  Once, 2 of 6 cycles  cyclophosphamide (CYTOXAN) 1,180 mg in sodium chloride 0.9 % 250 mL chemo infusion, 750 mg/m2 = 1,180 mg, Intravenous,  Once, 2 of 6 cycles  for chemotherapy treatment.        08/03/2016 Pathology Results    Cytogenetic lab results [facility]). Cytogenetic Analysis: Normal.       09/23/2016 PET scan    1. Significantly improved appearance, with resolution of prior hypermetabolic activity in all of the previously involved lymph nodes. All lymph nodes are currently within normal size limits. 2. Other imaging findings of potential clinical significance: Chronic right sphenoid sinusitis. Right mastoid effusion. Coronary, aortic arch, and branch vessel atherosclerotic vascular disease. Severe emphysema. Aortoiliac atherosclerotic vascular disease.      11/12/2016 -  Chemotherapy    Completed cycle 6 of RCHOP       12/11/2016 PET scan    1. No evidence of residual hypermetabolic lymphoma or adenopathy. 2. Age advanced coronary artery atherosclerosis. Recommend assessment of coronary risk factors and consideration of medical therapy. 3. Centrilobular emphysema.       Patient presents today for continued follow-up. She states that she feels like her energy level is not improving. She is also possible about 10 pounds since her last visit. She states that she has been eating multiple small meals throughout the day. We will review of what she's been eating, is noticed that she's eating a lot of carbs without a lot of protein. She still has her night sweats, however they are not drenching likely were when she was getting active treatment for her lymphoma. She denies any recent fevers or chills. She states she will like to return to work but thinks that she would not have enough energy to tolerate a full day  of work. She denies any chest pain, shortness breath, abdominal pain, nausea, vomiting, diarrhea. 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  Review of Systems  Constitutional: Positive for malaise/fatigue and weight loss. Negative for chills and fever.       Low energy  HENT: Negative.  Negative for hearing loss, sore throat and tinnitus.   Eyes: Negative.  Negative for blurred vision, photophobia and discharge.  Respiratory: Negative.  Negative for cough, hemoptysis, shortness of breath and wheezing.   Cardiovascular: Negative.  Negative for chest pain, palpitations, orthopnea, claudication and leg swelling.  Gastrointestinal: Negative for abdominal pain, constipation, diarrhea, melena, nausea and vomiting.  Genitourinary: Negative.  Negative for dysuria and hematuria.  Musculoskeletal: Negative.  Negative for back pain, joint pain and myalgias.  Skin: Negative.  Negative for itching and rash.  Neurological: Negative for dizziness, focal weakness, weakness and headaches.       Numbness in fingertips  Endo/Heme/Allergies: Negative.  Negative for environmental allergies and polydipsia. Does not bruise/bleed easily.  Psychiatric/Behavioral: Negative.  Negative for depression. The patient is not nervous/anxious and does not have insomnia.   All other systems reviewed and are negative.   Past Medical History:  Diagnosis Date  . Anxiety   . Arthritis   . DLBCL (diffuse large B cell lymphoma) ([place]) 07/16/2016  . GERD (gastroesophageal reflux disease)   . Heart disease   . Hyperlipidemia   .  Hypertension   . Myocardial infarction ([place]) 12/19/2009   released from cardiology    Past Surgical History:  Procedure Laterality Date  . CARDIAC CATHETERIZATION     cardiac stent  . CHOLECYSTECTOMY    . CORONARY STENT PLACEMENT  2011  . INGUINAL LYMPH NODE BIOPSY Left 07/13/2016   Procedure: EXCISIONAL BIOPSY OF LEFT INGUINAL LYMPH NODE;  Surgeon: Vickie Epley, MD;  Location: [facility];  Service: General;  Laterality: Left;  . PORTACATH PLACEMENT N/A 07/17/2016   Procedure: INSERTION OF TUNNELED CENTRAL VENOUS CATHETER WITH SUBCUTANEOUS PORT;  Surgeon: Vickie Epley, MD;  Location: [facility];  Service: Vascular;  Laterality: N/A;  . TUBAL LIGATION      Family History  Problem Relation Age of Onset  . Cancer Mother        colon  . Depression Mother   . Heart disease Father   . Depression Sister   . Cancer Sister        leukemia  . Hyperlipidemia Brother   . Hypertension Brother     Social History   Social History  . Marital status: Single    Spouse name: N/A  . Number of children: N/A  . Years of education: N/A   Social History Main Topics  . Smoking status: Current Every Day Smoker    Packs/day: 0.50    Years: 40.00    Types: Cigarettes  . Smokeless tobacco: Never Used  . Alcohol use No  . Drug use: No  . Sexual activity: Yes    Birth control/ protection: Post-menopausal   Other Topics Concern  . None   Social History Narrative  . None     PHYSICAL EXAMINATION  ECOG PERFORMANCE STATUS: 1 - Symptomatic but completely ambulatory  Vitals:   03/17/17 1107  BP: (!) 145/81  Pulse: 94  Resp: 18  Temp: 97.8 F (36.6 C)  SpO2: 100%     Physical Exam  Constitutional: She is oriented to person, place, and time and well-developed, well-nourished, and in no distress. No distress.  HENT:  Head: Normocephalic and atraumatic.  Mouth/Throat: No oropharyngeal exudate.  Eyes: Pupils are equal, round, and reactive to light. Conjunctivae and EOM are normal. No scleral icterus.  Neck: Normal range of motion. Neck supple. No JVD present.  Cardiovascular: Normal rate, regular rhythm and normal heart sounds.  Exam reveals no gallop and no friction rub.   No murmur heard. Pulmonary/Chest: Effort normal and breath sounds normal. No respiratory distress. She has no wheezes. She has no rales.  Abdominal: Soft. Bowel sounds  are normal. She exhibits no distension. There is no tenderness. There is no guarding.  Musculoskeletal: Normal range of motion. She exhibits no edema or tenderness.  Lymphadenopathy:    She has no cervical adenopathy.  Neurological: She is alert and oriented to person, place, and time. No cranial nerve deficit. Gait normal.  Skin: Skin is warm and dry. No rash noted. No erythema. No pallor.  Psychiatric: Affect and judgment normal.  Nursing note and vitals reviewed.    LABORATORY DATA: CBC    Component Value Date/Time   WBC 5.9 03/17/2017 1145   RBC 4.63 03/17/2017 1145   HGB 15.0 03/17/2017 1145   HCT 43.8 03/17/2017 1145   PLT 201 03/17/2017 1145   MCV 94.6 03/17/2017 1145   MCH 32.4 03/17/2017 1145   MCHC 34.2 03/17/2017 1145   RDW 13.3 03/17/2017 1145   LYMPHSABS 1.0 03/17/2017 1145   MONOABS 0.7 03/17/2017 1145  EOSABS 0.1 03/17/2017 1145   BASOSABS 0.0 03/17/2017 1145      Chemistry      Component Value Date/Time   NA 135 03/17/2017 1145   K 4.2 03/17/2017 1145   CL 99 (L) 03/17/2017 1145   CO2 27 03/17/2017 1145   BUN 11 03/17/2017 1145   CREATININE 0.64 03/17/2017 1145      Component Value Date/Time   CALCIUM 9.9 03/17/2017 1145   ALKPHOS 96 03/17/2017 1145   AST 22 03/17/2017 1145   ALT 22 03/17/2017 1145   BILITOT 0.5 03/17/2017 1145     Lab Results  Component Value Date   CALCIUM 9.9 03/17/2017   PHOS 4.1 11/11/2016    Lab Results  Component Value Date   LABURIC 2.9 11/11/2016     PENDING LABS:   RADIOGRAPHIC STUDIES: I have personally reviewed the radiological images as listed and agreed with the findings in the report.  NUCLEAR MEDICINE PET SKULL BASE TO THIGH 09/23/2016  IMPRESSION: 1. Significantly improved appearance, with resolution of prior hypermetabolic activity in all of the previously involved lymph nodes. All lymph nodes are currently within normal size limits. 2. Other imaging findings of potential clinical  significance: Chronic right sphenoid sinusitis. Right mastoid effusion. Coronary, aortic arch, and branch vessel atherosclerotic vascular disease. Severe emphysema. Aortoiliac atherosclerotic vascular disease.  PATHOLOGY:     ASSESSMENT AND PLAN:  1. Stage III DLBCL, R-CHOP beginning on 07/30/2016. Patient had a great response after 3 cycles of RCHOP. Restaging PET-CT 09/23/16 demonstrated significantly improved appearance, with resolution of prior hypermetabolic activity in all of the previously involved lymph nodes. All lymph nodes are currently within normal size limits. Received 6 cycles of RCHOP q21 days from 07/30/16 - 11/12/16.  Plan: -I have recommended for patient to increase protein in her diet. She does not like any of the nutritional supplements including Ensure/Boost, however she does drink carnation diet. -Clinically NED on exam today. -Plan to repeat restaging PET CT prior to her next visit. -RTC in 3 months for follow up and to review her labs and scans.  THERAPY PLAN:  NCCN guidelines recommends the follow surveillance for Stage I, II for those who asertain a complete response in the first-line treatment setting (3.2017):  A. H&P every 3-6 months for 5 years, then yearly or as clinically indicated.  B. Labs every 3-6 months for 5 years and then annually or as clinically indicated.  C. Imaging only as indicated. For those ascertaining a complete response in the Stage III, IV setting in first-line treatment setting:  A. H&P every 3-6 months for 5 years, then yearly or as clinically indicated.  B. Labs every 3-6 months for 5 years and then annually or as clinically indicated.  C. CT C/A/P with contrast no more than every 6 months for 2 years after  completion of treatment; then only as indicated.    All questions were answered. The patient knows to call the clinic with any problems, questions or concerns. We can certainly see the patient much sooner if necessary.   This  note is electronically signed by: Twana First, MD 03/17/2017 12:45 PM

## 2017-03-17 NOTE — Patient Instructions (Signed)
Gabriella Love at [facility] Discharge Instructions  RECOMMENDATIONS MADE BY THE CONSULTANT AND ANY TEST RESULTS WILL BE SENT TO YOUR REFERRING PHYSICIAN.  Port flush with lab work today as scheduled.  Thank you for choosing [place] at [facility] to provide your oncology and hematology care.  To afford each patient quality time with our provider, please arrive at least 15 minutes before your scheduled appointment time.    If you have a lab appointment with the [facility] please come in thru the  Main Entrance and check in at the main information desk  You need to re-schedule your appointment should you arrive 10 or more minutes late.  We strive to give you quality time with our providers, and arriving late affects you and other patients whose appointments are after yours.  Also, if you no show three or more times for appointments you may be dismissed from the clinic at the providers discretion.     Again, thank you for choosing [facility].  Our hope is that these requests will decrease the amount of time that you wait before being seen by our physicians.       _____________________________________________________________  Should you have questions after your visit to [facility], please contact our office at [phone] between the hours of 8:30 a.m. and 4:30 p.m.  Voicemails left after 4:30 p.m. will not be returned until the following business day.  For prescription refill requests, have your pharmacy contact our office.       Resources For Cancer Patients and their Caregivers ? American Cancer Society: Can assist with transportation, wigs, general needs, runs Look Good Feel Better.        956-758-9586 ? Cancer Care: Provides financial assistance, online support groups, medication/co-pay assistance.  1-800-813-HOPE (325)035-3357) ? [facility] Assists [place] cancer patients and  their families through emotional , educational and financial support.  8254598025 ? [place] DSS Where to apply for food stamps, Medicaid and utility assistance. 307-406-0448 ? RCATS: Transportation to medical appointments. 548-103-2096 ? Social Security Administration: May apply for disability if have a Stage IV cancer. (587) 435-4964 220-321-2727 ? [employer], Disability and Transit Services: Assists with nutrition, care and transit needs. [place] Support Programs: @10RELATIVEDAYS @ > Cancer Support Group  2nd Tuesday of the month 1pm-2pm, Journey Room  > Creative Journey  3rd Tuesday of the month 1130am-1pm, Journey Room  > Look Good Feel Better  1st Wednesday of the month 10am-12 noon, Journey Room (Call [place] to register [phone])

## 2017-03-17 NOTE — Patient Instructions (Signed)
[facility] at [facility] Discharge Instructions  RECOMMENDATIONS MADE BY THE CONSULTANT AND ANY TEST RESULTS WILL BE SENT TO YOUR REFERRING PHYSICIAN.  You were seen today by Dr. Louise Zhou    Thank you for choosing [facility] at [facility] to provide your oncology and hematology care.  To afford each patient quality time with our provider, please arrive at least 15 minutes before your scheduled appointment time.    If you have a lab appointment with the Cancer Center please come in thru the  Main Entrance and check in at the main information desk  You need to re-schedule your appointment should you arrive 10 or more minutes late.  We strive to give you quality time with our providers, and arriving late affects you and other patients whose appointments are after yours.  Also, if you no show three or more times for appointments you may be dismissed from the clinic at the providers discretion.     Again, thank you for choosing [facility].  Our hope is that these requests will decrease the amount of time that you wait before being seen by our physicians.       _____________________________________________________________  Should you have questions after your visit to  Cancer Center, please contact our office at [phone] between the hours of 8:30 a.m. and 4:30 p.m.  Voicemails left after 4:30 p.m. will not be returned until the following business day.  For prescription refill requests, have your pharmacy contact our office.       Resources For Cancer Patients and their Caregivers ? American Cancer Society: Can assist with transportation, wigs, general needs, runs Look Good Feel Better.        1-888-227-6333 ? Cancer Care: Provides financial assistance, online support groups, medication/co-pay assistance.  1-800-813-HOPE (4673) ? [facility] Assists [place] cancer patients and their  families through emotional , educational and financial support.  336-427-4357 ? [place] DSS Where to apply for food stamps, Medicaid and utility assistance. 336-342-1394 ? RCATS: Transportation to medical appointments. 336-347-2287 ? Social Security Administration: May apply for disability if have a Stage IV cancer. 336-342-7796 1-800-772-1213 ? [place] Aging, Disability and Transit Services: Assists with nutrition, care and transit needs. 336-349-2343  Cancer Center Support Programs: @10RELATIVEDAYS@ > Cancer Support Group  2nd Tuesday of the month 1pm-2pm, Journey Room  > Creative Journey  3rd Tuesday of the month 1130am-1pm, Journey Room  > Look Good Feel Better  1st Wednesday of the month 10am-12 noon, Journey Room (Call [employer] to register 1-800-395-5775)    

## 2017-03-17 NOTE — Progress Notes (Signed)
Gabriella Love presented for Portacath access and flush. Proper placement of portacath confirmed by CXR. Portacath located right chest wall accessed with  H 20 needle. Good blood return present. Portacath flushed with 79ml NS and 500U/62ml Heparin and needle removed intact. Procedure without incident. Patient tolerated procedure well.  Stable and ambulatory on discharge home to self.

## 2017-03-18 ENCOUNTER — Other Ambulatory Visit (HOSPITAL_COMMUNITY): Payer: Self-pay

## 2017-03-18 DIAGNOSIS — C8330 Diffuse large B-cell lymphoma, unspecified site: Secondary | ICD-10-CM

## 2017-05-12 ENCOUNTER — Encounter (HOSPITAL_COMMUNITY): Payer: Self-pay

## 2017-05-12 ENCOUNTER — Encounter (HOSPITAL_COMMUNITY): Payer: Self-pay | Attending: Oncology

## 2017-05-12 VITALS — BP 135/77 | HR 80 | Temp 98.3°F | Resp 16

## 2017-05-12 DIAGNOSIS — Z23 Encounter for immunization: Secondary | ICD-10-CM

## 2017-05-12 DIAGNOSIS — Z95828 Presence of other vascular implants and grafts: Secondary | ICD-10-CM | POA: Insufficient documentation

## 2017-05-12 DIAGNOSIS — C8330 Diffuse large B-cell lymphoma, unspecified site: Secondary | ICD-10-CM

## 2017-05-12 MED ORDER — HEPARIN SOD (PORK) LOCK FLUSH 100 UNIT/ML IV SOLN
500.0000 [IU] | Freq: Once | INTRAVENOUS | Status: AC
  Administered 2017-05-12: 500 [IU] via INTRAVENOUS

## 2017-05-12 MED ORDER — SODIUM CHLORIDE 0.9% FLUSH
10.0000 mL | INTRAVENOUS | Status: DC | PRN
  Administered 2017-05-12: 10 mL via INTRAVENOUS
  Filled 2017-05-12: qty 10

## 2017-05-12 MED ORDER — INFLUENZA VAC SPLIT QUAD 0.5 ML IM SUSY
0.5000 mL | PREFILLED_SYRINGE | Freq: Once | INTRAMUSCULAR | Status: AC
  Administered 2017-05-12: 0.5 mL via INTRAMUSCULAR

## 2017-05-12 NOTE — Patient Instructions (Signed)
[place] at [facility] Discharge Instructions  RECOMMENDATIONS MADE BY THE CONSULTANT AND ANY TEST RESULTS WILL BE SENT TO YOUR REFERRING PHYSICIAN.  You had your port flushed today If the area around the port becomes red,painful or begins to drain please call us and let us know Keep the area clean and dry You also had your flu shot today Follow up as scheduled next month.  Thank you for choosing [place] at [facility] to provide your oncology and hematology care.  To afford each patient quality time with our provider, please arrive at least 15 minutes before your scheduled appointment time.    If you have a lab appointment with the [facility] please come in thru the  Main Entrance and check in at the main information desk  You need to re-schedule your appointment should you arrive 10 or more minutes late.  We strive to give you quality time with our providers, and arriving late affects you and other patients whose appointments are after yours.  Also, if you no show three or more times for appointments you may be dismissed from the clinic at the providers discretion.     Again, thank you for choosing [facility].  Our hope is that these requests will decrease the amount of time that you wait before being seen by our physicians.       _____________________________________________________________  Should you have questions after your visit to [facility], please contact our office at [phone] between the hours of 8:30 a.m. and 4:30 p.m.  Voicemails left after 4:30 p.m. will not be returned until the following business day.  For prescription refill requests, have your pharmacy contact our office.       Resources For Cancer Patients and their Caregivers ? American Cancer Society: Can assist with transportation, wigs, general needs, runs Look Good Feel Better.        205 836 6470 ? Cancer Care: Provides  financial assistance, online support groups, medication/co-pay assistance.  1-800-813-HOPE (574)741-4925) ? [facility] Assists [place] cancer patients and their families through emotional , educational and financial support.  684 736 2479 ? [place] DSS Where to apply for food stamps, Medicaid and utility assistance. 916 704 2947 ? [employer]ATS: Transportation to medical appointments. (858) 371-9268 ? Social Security Administration: May apply for disability if have a Stage IV cancer. (508) 728-3524 614-355-9656 ? [employer], Disability and Transit Services: Assists with nutrition, care and transit needs. [place] Support Programs: @10RELATIVEDAYS @ > Cancer Support Group  2nd Tuesday of the month 1pm-2pm, Journey Room  > Creative Journey  3rd Tuesday of the month 1130am-1pm, Journey Room  > Look Good Feel Better  1st Wednesday of the month 10am-12 noon, Journey Room (Call [place] to register [phone])

## 2017-05-12 NOTE — Progress Notes (Signed)
Small 1cm open skin above port. Patient reports has been there for 6 days.  Area is normal color no redness noted.  No drainage. Advised patient to keep an eye on the area and if any signs of infection occur for her to give [place] a call immediately.  Dr. Talbert Cage in room to assess port as well. No orders received from her at this time.  Gabriella Love presented for Portacath access and flush. Portacath located right chest wall accessed with  H 20 needle. Good blood return present. Portacath flushed with 77ml NS and 500U/8ml Heparin and needle removed intact. Procedure without incident. Patient tolerated procedure well.  Gabriella Love presents today for injection per MD orders. Fluarix administered IM in left deltoid. Administration without incident. Patient tolerated well.  Patient tolerated treatment without incidence. Patient discharged ambulatory and in stable condition from clinic. Patient to follow up as scheduled.

## 2017-06-18 ENCOUNTER — Encounter (HOSPITAL_COMMUNITY)
Admission: RE | Admit: 2017-06-18 | Discharge: 2017-06-18 | Disposition: A | Discharge status: Home / Self Care | Payer: Self-pay | Source: Ambulatory Visit | Attending: Oncology | Admitting: Oncology

## 2017-06-18 DIAGNOSIS — C8330 Diffuse large B-cell lymphoma, unspecified site: Secondary | ICD-10-CM | POA: Insufficient documentation

## 2017-06-18 LAB — GLUCOSE, CAPILLARY: Glucose-Capillary: 99 mg/dL (ref 65–99)

## 2017-06-18 MED ORDER — FLUDEOXYGLUCOSE F - 18 (FDG) INJECTION
4.9000 | Freq: Once | INTRAVENOUS | Status: AC | PRN
  Administered 2017-06-18: 4.9 via INTRAVENOUS

## 2017-06-22 ENCOUNTER — Other Ambulatory Visit: Payer: Self-pay

## 2017-06-22 ENCOUNTER — Encounter (HOSPITAL_COMMUNITY): Payer: Self-pay | Attending: Oncology | Admitting: Oncology

## 2017-06-22 ENCOUNTER — Encounter (HOSPITAL_COMMUNITY): Payer: Self-pay

## 2017-06-22 ENCOUNTER — Encounter (HOSPITAL_BASED_OUTPATIENT_CLINIC_OR_DEPARTMENT_OTHER): Payer: Self-pay

## 2017-06-22 VITALS — BP 139/66 | HR 81 | Temp 97.8°F | Resp 18 | Ht 61.5 in | Wt 102.0 lb

## 2017-06-22 DIAGNOSIS — C8330 Diffuse large B-cell lymphoma, unspecified site: Secondary | ICD-10-CM

## 2017-06-22 DIAGNOSIS — E785 Hyperlipidemia, unspecified: Secondary | ICD-10-CM

## 2017-06-22 DIAGNOSIS — I10 Essential (primary) hypertension: Secondary | ICD-10-CM

## 2017-06-22 LAB — CBC WITH DIFFERENTIAL/PLATELET
Basophils Absolute: 0 10*3/uL (ref 0.0–0.1)
Basophils Relative: 1 %
EOS PCT: 1 %
Eosinophils Absolute: 0.1 10*3/uL (ref 0.0–0.7)
HCT: 42.9 % (ref 36.0–46.0)
Hemoglobin: 14 g/dL (ref 12.0–15.0)
LYMPHS ABS: 0.9 10*3/uL (ref 0.7–4.0)
LYMPHS PCT: 15 %
MCH: 32.4 pg (ref 26.0–34.0)
MCHC: 32.6 g/dL (ref 30.0–36.0)
MCV: 99.3 fL (ref 78.0–100.0)
MONO ABS: 0.5 10*3/uL (ref 0.1–1.0)
MONOS PCT: 9 %
Neutro Abs: 4.3 10*3/uL (ref 1.7–7.7)
Neutrophils Relative %: 74 %
PLATELETS: 220 10*3/uL (ref 150–400)
RBC: 4.32 MIL/uL (ref 3.87–5.11)
RDW: 13.6 % (ref 11.5–15.5)
WBC: 5.7 10*3/uL (ref 4.0–10.5)

## 2017-06-22 LAB — COMPREHENSIVE METABOLIC PANEL
ALK PHOS: 83 U/L (ref 38–126)
ALT: 9 U/L — AB (ref 14–54)
ANION GAP: 9 (ref 5–15)
AST: 14 U/L — ABNORMAL LOW (ref 15–41)
Albumin: 4.1 g/dL (ref 3.5–5.0)
BUN: 10 mg/dL (ref 6–20)
CHLORIDE: 104 mmol/L (ref 101–111)
CO2: 26 mmol/L (ref 22–32)
Calcium: 9.5 mg/dL (ref 8.9–10.3)
Creatinine, Ser: 0.64 mg/dL (ref 0.44–1.00)
GLUCOSE: 79 mg/dL (ref 65–99)
POTASSIUM: 3.7 mmol/L (ref 3.5–5.1)
SODIUM: 139 mmol/L (ref 135–145)
Total Bilirubin: 0.6 mg/dL (ref 0.3–1.2)
Total Protein: 6.3 g/dL — ABNORMAL LOW (ref 6.5–8.1)

## 2017-06-22 LAB — LACTATE DEHYDROGENASE: LDH: 143 U/L (ref 98–192)

## 2017-06-22 MED ORDER — SODIUM CHLORIDE 0.9% FLUSH
10.0000 mL | INTRAVENOUS | Status: DC | PRN
Start: 2017-06-22 — End: 2017-06-22
  Administered 2017-06-22: 10 mL via INTRAVENOUS
  Filled 2017-06-22: qty 10

## 2017-06-22 MED ORDER — HEPARIN SOD (PORK) LOCK FLUSH 100 UNIT/ML IV SOLN
INTRAVENOUS | Status: AC
  Filled 2017-06-22: qty 5

## 2017-06-22 MED ORDER — HEPARIN SOD (PORK) LOCK FLUSH 100 UNIT/ML IV SOLN
500.0000 [IU] | Freq: Once | INTRAVENOUS | Status: AC
  Administered 2017-06-22: 500 [IU] via INTRAVENOUS

## 2017-06-22 NOTE — Patient Instructions (Signed)
[place] at [facility] Discharge Instructions  RECOMMENDATIONS MADE BY THE CONSULTANT AND ANY TEST RESULTS WILL BE SENT TO YOUR REFERRING PHYSICIAN.  You had your port flushed and lab work done today We will call you with the results Follow up as scheduled.  Thank you for choosing [place] at [facility] to provide your oncology and hematology care.  To afford each patient quality time with our provider, please arrive at least 15 minutes before your scheduled appointment time.    If you have a lab appointment with the [place] please come in thru the  Main Entrance and check in at the main information desk  You need to re-schedule your appointment should you arrive 10 or more minutes late.  We strive to give you quality time with our providers, and arriving late affects you and other patients whose appointments are after yours.  Also, if you no show three or more times for appointments you may be dismissed from the clinic at the providers discretion.     Again, thank you for choosing [facility].  Our hope is that these requests will decrease the amount of time that you wait before being seen by our physicians.       _____________________________________________________________  Should you have questions after your visit to [facility], please contact our office at [phone] between the hours of 8:30 a.m. and 4:30 p.m.  Voicemails left after 4:30 p.m. will not be returned until the following business day.  For prescription refill requests, have your pharmacy contact our office.       Resources For Cancer Patients and their Caregivers ? American Cancer Society: Can assist with transportation, wigs, general needs, runs Look Good Feel Better.        [phone] ? Cancer Care: Provides financial assistance, online support groups, medication/co-pay assistance.  1-800-813-HOPE 530-411-5118) ? [place] Assists [place] cancer patients and their families through emotional , educational and financial support.  216-025-6682 ? [place] DSS Where to apply for food stamps, Medicaid and utility assistance. ([phone] 741-7672 ? RCATS: Transportation to medical appointments. ([phone] ? Social Security Administration: May apply for disability if have a Stage IV cancer. 678-765-7556 531-328-7263 ? [employer], Disability and Transit Services: Assists with nutrition, care and transit needs. [place] Support Programs: @10RELATIVEDAYS @ > Cancer Support Group  2nd Tuesday of the month 1pm-2pm, Journey Room  > Creative Journey  3rd Tuesday of the month 1130am-1pm, Journey Room  > Look Good Feel Better  1st Wednesday of the month 10am-12 noon, Journey Room (Call [place] to register [phone])

## 2017-06-22 NOTE — Progress Notes (Signed)
Gabriella Love presented for Portacath access and flush. Portacath located right chest wall accessed with  H 20 needle. Good blood return present and labs drawn from port. Portacath flushed with 15ml NS and 500U/61ml Heparin and needle removed intact. Procedure without incident. Patient tolerated procedure well. Patient tolerated treatment without incidence. Patient discharged ambulatory and in stable condition from clinic. Patient to follow up as scheduled.

## 2017-06-22 NOTE — Progress Notes (Signed)
Gabriella Love, [name] [address]  No diagnosis found.  Progress Note  CURRENT THERAPY: R-CHOP beginning on 07/30/2016  INTERVAL HISTORY: Gabriella Love 61 y.o. female returns for followup of Stage III DLBCL, R-CHOP beginning on 07/30/2016.    DLBCL (diffuse large B cell lymphoma) ([place])   07/13/2016 Initial Biopsy    L inguinal biopsy with Dr. 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 Grade (if applicable): High grade. Flow cytometry: [id number] reveals a monoclonal B-cell population with expression of CD10. Immunohistochemical stains: CD20, CD3, CD10, bcl-2, bcl-6, CD21, CD23, Ki-67. Touch preps/imprints: Mixture of large and smaller lymphocytes with irregular nuclear contours. Comments: Sections of lymph node reveal effacement of the architecture by a nodular proliferation of neoplastic appearing follicles. In many of the areas the follicles coalesce and form sheets of large cells. The more formed follicles predominately consist of numerous large centroblasts (>15 per hpf), representing a high grade (3B) follicular component. The diffuse areas are also composed of large cells but lack follicular dendritic networks (CD21, CD23). Immunohistochemistry reveals the atypical lymphocytes are positive for CD20, CD10, and bcl-6. They are negative for bcl-2. Ki-67 is elevated (30% overall). CD3 reveals residual surrounding T-cells. Flow cytometry ([id number]) reveals a monoclonal B-cell population with expression of CD10. 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 The case was called to Dr. Rosana Hoes on 07/16/2016.      07/23/2016 Echocardiogram    Left ventricle: Systolic function  was normal. The estimated ejection fraction was in the range of 50% to 55%. Doppler parameters are consistent with abnormal left ventricular relaxation (grade 1 diastolic dysfunction).      07/24/2016 Procedure    Technically successful CT guided right iliac bone core and aspiration biopsy by IR.      07/28/2016 PET scan    1. Hypermetabolic lymphadenopathy in the neck, abdomen and right inguinal area as described above, consistent with known lymphoma. 2. Focal area of hypermetabolism in the appendix could be lymphomatous involvement. Recommend attention on follow-up scans. 3. Age advanced atherosclerotic calcifications involving the abdominal aorta and branch vessels and age advanced three-vessel coronary artery calcifications. 4. No obvious osseous involvement. 5. Emphysematous changes and pulmonary scarring.      07/28/2016 Pathology Results    Bone Marrow, Aspirate,Biopsy, and Clot, right iliac - NORMOCELLULAR BONE MARROW WITH TRILINEAGE HEMATOPOIESIS. PERIPHERAL BLOOD: - OCCASIONAL CIRCULATING ATYPICAL LYMPHOID CELLS.      07/28/2016 Pathology Results    Bone Marrow Flow Cytometry - NO MONOCLONAL B CELL POPULATION OR ABNORMAL T CELL PHENOTYPE IDENTIFIED.      07/30/2016 Pathology Results    Peripheral Blood Flow Cytometry - NO MONOCLONAL B-CELL POPULATION OR ABNORMAL T-CELL PHENOTYPE IDENTIFIED.      07/30/2016 -  Chemotherapy    The patient had DOXOrubicin (ADRIAMYCIN) chemo injection 78 mg, 50 mg/m2 = 78 mg, Intravenous,  Once, 2 of 6 cycles  palonosetron (ALOXI) injection 0.25 mg, 0.25 mg, Intravenous,  Once, 2 of 6 cycles  pegfilgrastim (NEULASTA ONPRO KIT) injection 6 mg, 6 mg, Subcutaneous, Once, 2 of 6 cycles  vinCRIStine (ONCOVIN) 2 mg in sodium chloride 0.9 % 50 mL chemo infusion, 2 mg, Intravenous,  Once, 2 of 6 cycles  riTUXimab (RITUXAN) 600 mg in sodium chloride 0.9 % 250 mL (1.9355 mg/mL) chemo infusion, 375 mg/m2 = 600 mg, Intravenous,  Once, 2 of 6  cycles  cyclophosphamide (CYTOXAN) 1,180 mg in sodium chloride 0.9 % 250 mL chemo infusion, 750 mg/m2 = 1,180 mg, Intravenous,  Once, 2 of 6 cycles  for chemotherapy treatment.        08/03/2016 Pathology Results    Cytogenetic lab results [facility]). Cytogenetic Analysis: Normal.       09/23/2016 PET scan    1. Significantly improved appearance, with resolution of prior hypermetabolic activity in all of the previously involved lymph nodes. All lymph nodes are currently within normal size limits. 2. Other imaging findings of potential clinical significance: Chronic right sphenoid sinusitis. Right mastoid effusion. Coronary, aortic arch, and branch vessel atherosclerotic vascular disease. Severe emphysema. Aortoiliac atherosclerotic vascular disease.      11/12/2016 -  Chemotherapy    Completed cycle 6 of RCHOP       12/11/2016 PET scan    1. No evidence of residual hypermetabolic lymphoma or adenopathy. 2. Age advanced coronary artery atherosclerosis. Recommend assessment of coronary risk factors and consideration of medical therapy. 3. Centrilobular emphysema.       Patient presents today for continued follow-up with her husband. She states she feels like she gets tired easily with exertion and is not back to her baseline prior to receiving chemotherapy. She denies any chest pain, shortness breath, abdominal pain, nausea, vomiting, diarrhea. She has not had any drenching night sweats, weight loss, fevers/chills.  Review of Systems  Constitutional: Negative for chills, fever, malaise/fatigue and weight loss.       Low energy  HENT: Negative.  Negative for hearing loss, sore throat and tinnitus.   Eyes: Negative.  Negative for blurred vision, photophobia and discharge.  Respiratory: Negative.  Negative for cough, hemoptysis, shortness of breath and wheezing.   Cardiovascular: Negative.  Negative for chest pain, palpitations, orthopnea, claudication and leg swelling.    Gastrointestinal: Negative for abdominal pain, constipation, diarrhea, melena, nausea and vomiting.  Genitourinary: Negative.  Negative for dysuria and hematuria.  Musculoskeletal: Negative.  Negative for back pain, joint pain and myalgias.  Skin: Negative.  Negative for itching and rash.  Neurological: Negative for dizziness, focal weakness, weakness and headaches.  Endo/Heme/Allergies: Negative.  Negative for environmental allergies and polydipsia. Does not bruise/bleed easily.  Psychiatric/Behavioral: Negative.  Negative for depression. The patient is not nervous/anxious and does not have insomnia.   All other systems reviewed and are negative.   Past Medical History:  Diagnosis Date  . Anxiety   . Arthritis   . DLBCL (diffuse large B cell lymphoma) ([place]) 07/16/2016  . GERD (gastroesophageal reflux disease)   . Heart disease   . Hyperlipidemia   . Hypertension   . Myocardial infarction ([place]) 12/19/2009   released from cardiology    Past Surgical History:  Procedure Laterality Date  . CARDIAC CATHETERIZATION     cardiac stent  . CHOLECYSTECTOMY    . CORONARY STENT PLACEMENT  2011  . INGUINAL LYMPH NODE BIOPSY Left 07/13/2016   Procedure: EXCISIONAL BIOPSY OF LEFT INGUINAL LYMPH NODE;  Surgeon: Vickie Epley, MD;  Location: [facility];  Service: General;  Laterality: Left;  . PORTACATH PLACEMENT N/A 07/17/2016   Procedure: INSERTION OF TUNNELED CENTRAL VENOUS CATHETER WITH SUBCUTANEOUS PORT;  Surgeon: Vickie Epley, MD;  Location: [facility];  Service: Vascular;  Laterality: N/A;  . TUBAL LIGATION      Family  History  Problem Relation Age of Onset  . Cancer Mother        colon  . Depression Mother   . Heart disease Father   . Depression Sister   . Cancer Sister        leukemia  . Hyperlipidemia Brother   . Hypertension Brother     Social History   Socioeconomic History  . Marital status: Single    Spouse name: None  . Number of children: None  . Years of  education: None  . Highest education level: None  Social Needs  . Financial resource strain: None  . Food insecurity - worry: None  . Food insecurity - inability: None  . Transportation needs - medical: None  . Transportation needs - non-medical: None  Occupational History  . None  Tobacco Use  . Smoking status: Current Every Day Smoker    Packs/day: 0.50    Years: 40.00    Pack years: 20.00    Types: Cigarettes  . Smokeless tobacco: Never Used  Substance and Sexual Activity  . Alcohol use: No  . Drug use: No  . Sexual activity: Yes    Birth control/protection: Post-menopausal  Other Topics Concern  . None  Social History Narrative  . None     PHYSICAL EXAMINATION  ECOG PERFORMANCE STATUS: 1 - Symptomatic but completely ambulatory  Vitals:   06/22/17 1118  BP: 139/66  Pulse: 81  Resp: 18  Temp: 97.8 F (36.6 C)  SpO2: 98%     Physical Exam  Constitutional: She is oriented to person, place, and time and well-developed, well-nourished, and in no distress. No distress.  HENT:  Head: Normocephalic and atraumatic.  Mouth/Throat: No oropharyngeal exudate.  Eyes: Conjunctivae and EOM are normal. Pupils are equal, round, and reactive to light. No scleral icterus.  Neck: Normal range of motion. Neck supple. No JVD present.  Cardiovascular: Normal rate, regular rhythm and normal heart sounds. Exam reveals no gallop and no friction rub.  No murmur heard. Pulmonary/Chest: Effort normal and breath sounds normal. No respiratory distress. She has no wheezes. She has no rales.  Abdominal: Soft. Bowel sounds are normal. She exhibits no distension. There is no tenderness. There is no guarding.  Musculoskeletal: Normal range of motion. She exhibits no edema or tenderness.  Lymphadenopathy:    She has no cervical adenopathy.  Neurological: She is alert and oriented to person, place, and time. No cranial nerve deficit. Gait normal.  Skin: Skin is warm and dry. No rash noted. No  erythema. No pallor.  Psychiatric: Affect and judgment normal.  Nursing note and vitals reviewed.    LABORATORY DATA: CBC    Component Value Date/Time   WBC 5.9 03/17/2017 1145   RBC 4.63 03/17/2017 1145   HGB 15.0 03/17/2017 1145   HCT 43.8 03/17/2017 1145   PLT 201 03/17/2017 1145   MCV 94.6 03/17/2017 1145   MCH 32.4 03/17/2017 1145   MCHC 34.2 03/17/2017 1145   RDW 13.3 03/17/2017 1145   LYMPHSABS 1.0 03/17/2017 1145   MONOABS 0.7 03/17/2017 1145   EOSABS 0.1 03/17/2017 1145   BASOSABS 0.0 03/17/2017 1145      Chemistry      Component Value Date/Time   NA 135 03/17/2017 1145   K 4.2 03/17/2017 1145   CL 99 (L) 03/17/2017 1145   CO2 27 03/17/2017 1145   BUN 11 03/17/2017 1145   CREATININE 0.64 03/17/2017 1145      Component Value Date/Time  CALCIUM 9.9 03/17/2017 1145   ALKPHOS 96 03/17/2017 1145   AST 22 03/17/2017 1145   ALT 22 03/17/2017 1145   BILITOT 0.5 03/17/2017 1145     Lab Results  Component Value Date   CALCIUM 9.9 03/17/2017   PHOS 4.1 11/11/2016    Lab Results  Component Value Date   LABURIC 2.9 11/11/2016     PENDING LABS:   RADIOGRAPHIC STUDIES: I have personally reviewed the radiological images as listed and agreed with the findings in the report.  NUCLEAR MEDICINE PET SKULL BASE TO THIGH 09/23/2016  IMPRESSION: 1. Significantly improved appearance, with resolution of prior hypermetabolic activity in all of the previously involved lymph nodes. All lymph nodes are currently within normal size limits. 2. Other imaging findings of potential clinical significance: Chronic right sphenoid sinusitis. Right mastoid effusion. Coronary, aortic arch, and branch vessel atherosclerotic vascular disease. Severe emphysema. Aortoiliac atherosclerotic vascular disease.  PATHOLOGY:     ASSESSMENT AND PLAN:  1. Stage III DLBCL, R-CHOP beginning on 07/30/2016. Patient had a great response after 3 cycles of RCHOP. Restaging PET-CT 09/23/16  demonstrated significantly improved appearance, with resolution of prior hypermetabolic activity in all of the previously involved lymph nodes. All lymph nodes are currently within normal size limits. Received 6 cycles of RCHOP q21 days from 07/30/16 - 11/12/16.  Plan: -Reviewed her restaging PET from 06/18/17 in detail with her and her husband today; she is clinically NED without any evidence of recurrence of DLBCL. -Clinically NED on exam today. -Plan to repeat restaging PET CT prior to her next visit. -RTC in 6 months for follow up and to review her labs and scans. If her labs and scans look good on the next visit, can consider removing chemoport.  -I have advised patient that in the interim, should she have any B symptoms or feel unwell like she did when she had active malignancy, she is to come see [place] sooner.  THERAPY PLAN:  NCCN guidelines recommends the follow surveillance for Stage I, II for those who asertain a complete response in the first-line treatment setting (3.2017):  A. H&P every 3-6 months for 5 years, then yearly or as clinically indicated.  B. Labs every 3-6 months for 5 years and then annually or as clinically indicated.  C. Imaging only as indicated. For those ascertaining a complete response in the Stage III, IV setting in first-line treatment setting:  A. H&P every 3-6 months for 5 years, then yearly or as clinically indicated.  B. Labs every 3-6 months for 5 years and then annually or as clinically indicated.  C. CT C/A/P with contrast no more than every 6 months for 2 years after  completion of treatment; then only as indicated.    All questions were answered. The patient knows to call the clinic with any problems, questions or concerns. We can certainly see the patient much sooner if necessary.   This note is electronically signed by: Twana First, MD 06/22/2017 11:22 AM

## 2017-08-23 ENCOUNTER — Encounter (HOSPITAL_COMMUNITY): Payer: Self-pay

## 2017-08-23 ENCOUNTER — Other Ambulatory Visit: Payer: Self-pay

## 2017-08-23 ENCOUNTER — Inpatient Hospital Stay (HOSPITAL_COMMUNITY): Payer: Self-pay | Attending: Internal Medicine

## 2017-08-23 VITALS — BP 142/74 | HR 88 | Temp 98.0°F | Resp 16 | Wt 106.6 lb

## 2017-08-23 DIAGNOSIS — C8330 Diffuse large B-cell lymphoma, unspecified site: Secondary | ICD-10-CM | POA: Insufficient documentation

## 2017-08-23 DIAGNOSIS — Z452 Encounter for adjustment and management of vascular access device: Secondary | ICD-10-CM | POA: Insufficient documentation

## 2017-08-23 MED ORDER — HEPARIN SOD (PORK) LOCK FLUSH 100 UNIT/ML IV SOLN
500.0000 [IU] | Freq: Once | INTRAVENOUS | Status: AC
  Administered 2017-08-23: 500 [IU] via INTRAVENOUS

## 2017-08-23 MED ORDER — SODIUM CHLORIDE 0.9% FLUSH
10.0000 mL | INTRAVENOUS | Status: DC | PRN
  Administered 2017-08-23: 10 mL via INTRAVENOUS
  Filled 2017-08-23: qty 10

## 2017-08-23 NOTE — Progress Notes (Signed)
Gabriella Love presented for Portacath access and flush. Portacath located right chest wall accessed with  H 20 needle. Good blood return present. Portacath flushed with 72ml NS and 500U/70ml Heparin and needle removed intact. Procedure without incident. Patient tolerated procedure well. Patient discharged ambulatory and in stable condition from clinic to self. Patient to follow up as scheduled.

## 2017-08-23 NOTE — Patient Instructions (Signed)
[place] at [facility] Discharge Instructions  RECOMMENDATIONS MADE BY THE CONSULTANT AND ANY TEST RESULTS WILL BE SENT TO YOUR REFERRING PHYSICIAN.  You had your port flushed today Follow up in 8 weeks as previously scheduled.   Thank you for choosing [place] at [facility] to provide your oncology and hematology care.  To afford each patient quality time with our provider, please arrive at least 15 minutes before your scheduled appointment time.    If you have a lab appointment with the [place] please come in thru the  Main Entrance and check in at the main information desk  You need to re-schedule your appointment should you arrive 10 or more minutes late.  We strive to give you quality time with our providers, and arriving late affects you and other patients whose appointments are after yours.  Also, if you no show three or more times for appointments you may be dismissed from the clinic at the providers discretion.     Again, thank you for choosing [facility].  Our hope is that these requests will decrease the amount of time that you wait before being seen by our physicians.       _____________________________________________________________  Should you have questions after your visit to [facility], please contact our office at [phone] between the hours of 8:30 a.m. and 4:30 p.m.  Voicemails left after 4:30 p.m. will not be returned until the following business day.  For prescription refill requests, have your pharmacy contact our office.       Resources For Cancer Patients and their Caregivers ? American Cancer Society: Can assist with transportation, wigs, general needs, runs Look Good Feel Better.        854-509-2505 ? Cancer Care: Provides financial assistance, online support groups, medication/co-pay assistance.  1-800-813-HOPE 317-328-7228) ? [place] Assists  [place] cancer patients and their families through emotional , educational and financial support.  ([phone] ? [place] DSS Where to apply for food stamps, Medicaid and utility assistance. 504 281 3623 ? [employer]: Transportation to medical appointments. 684-305-1225 ? Social Security Administration: May apply for disability if have a Stage IV cancer. [phone] ? [employer], Disability and Transit Services: Assists with nutrition, care and transit needs. [place] Support Programs: @10RELATIVEDAYS @ > Cancer Support Group  2nd Tuesday of the month 1pm-2pm, Journey Room  > Creative Journey  3rd Tuesday of the month 1130am-1pm, Journey Room  > Look Good Feel Better  1st Wednesday of the month 10am-12 noon, Journey Room (Call [place] to register [phone])

## 2017-09-29 IMAGING — CT CT BIOPSY
1 of 2 series · 15 of 31 positions shown, 19 images · non-contrast
Comparison: none

CLINICAL DATA: Diffuse large B-cell lymphoma

EXAM:
CT GUIDED DEEP ILIAC BONE ASPIRATION AND CORE BIOPSY
TECHNIQUE: The procedure, risks (including but not limited to bleeding,
infection, organ damage ), benefits, and alternatives were explained
to the patient. Questions regarding the procedure were encouraged
and answered. The patient understands and consents to the procedure.
Patient was placed supine on the CT gantry and limited axial scans
through the pelvis were obtained. Appropriate skin entry site was
identified. Skin site was marked, prepped with chlorhexidine, draped
in usual sterile fashion, and infiltrated locally with 1% lidocaine.
Intravenous Fentanyl and Versed were administered as conscious
sedation during continuous monitoring of the patient's level of
consciousness and physiological / cardiorespiratory status by the
radiology RN, with a total moderate sedation time of 7 minutes.
Under CT fluoroscopic guidance an 11-gauge Cook trocar bone needle
was advanced into the right iliac bone just lateral to the
sacroiliac joint. Once needle tip position was confirmed, coaxial
core and aspiration samples were obtained. The final sample was
obtained using the guiding needle itself, which was then removed.
Post procedure scans show no hematoma or fracture. Patient tolerated
procedure well.
COMPLICATIONS:
COMPLICATIONS
none

[Series 2: i-spiral 5.0 b70f · axial · 0.67mm/px · z∈[-109,-18]mm · 15 of 30 slices shown, 19 images]
[im 2/30  mediastinal]
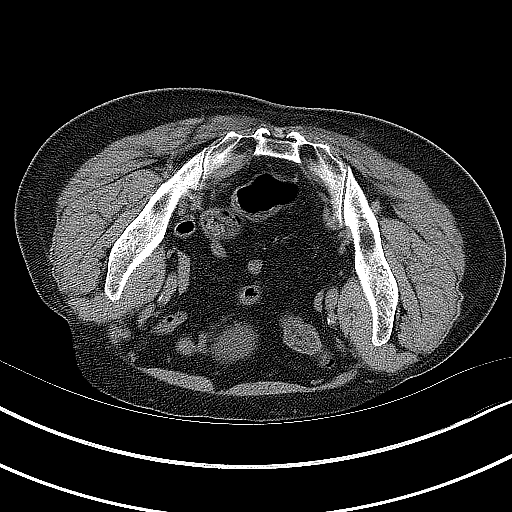
[im 2/30  lung]
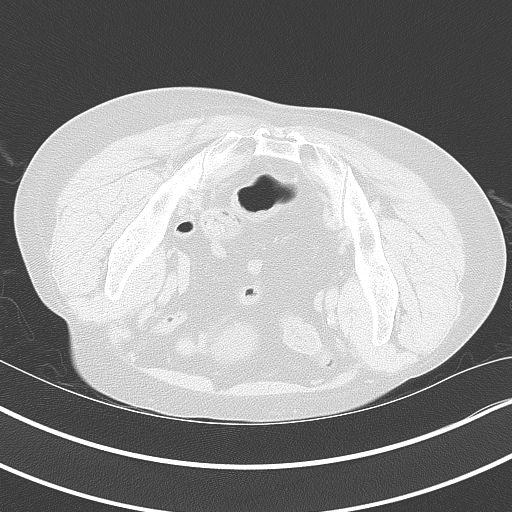
[im 4/30  lung]
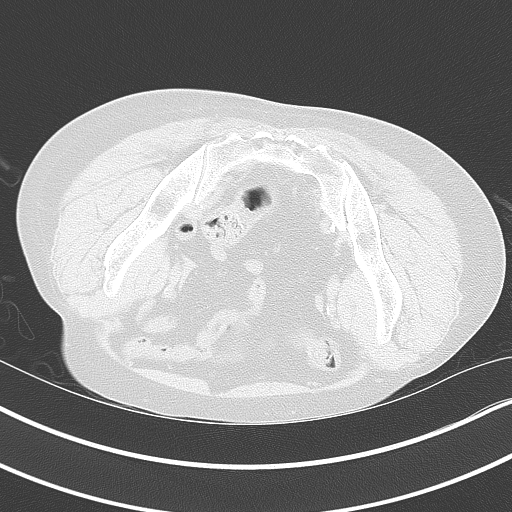
[im 7/30  lung]
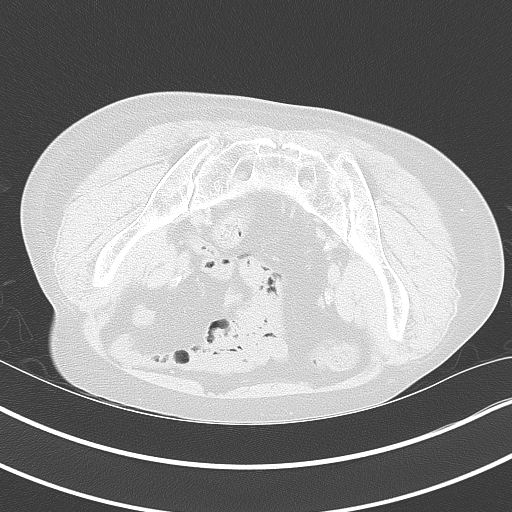
[im 9/30  lung]
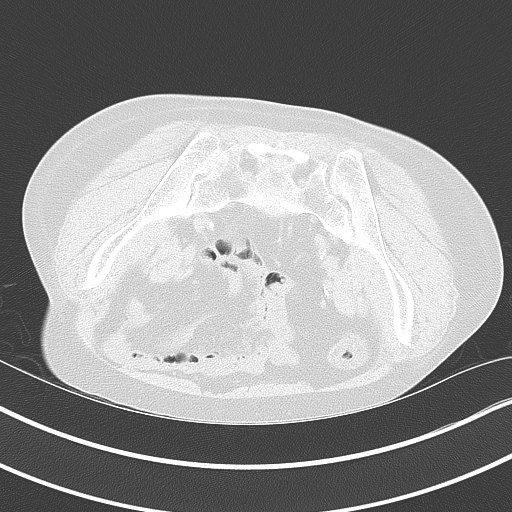
[im 10/30  mediastinal]
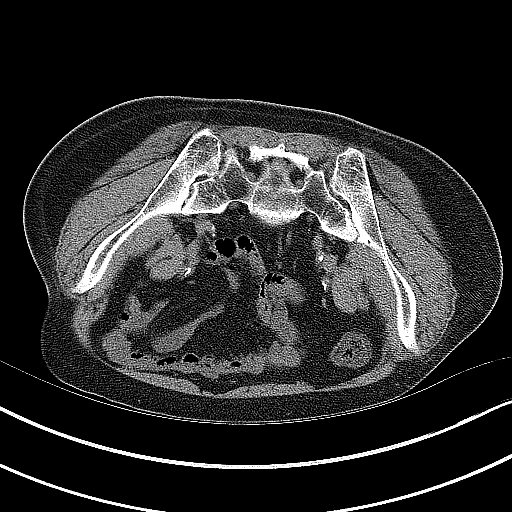
[im 10/30  lung]
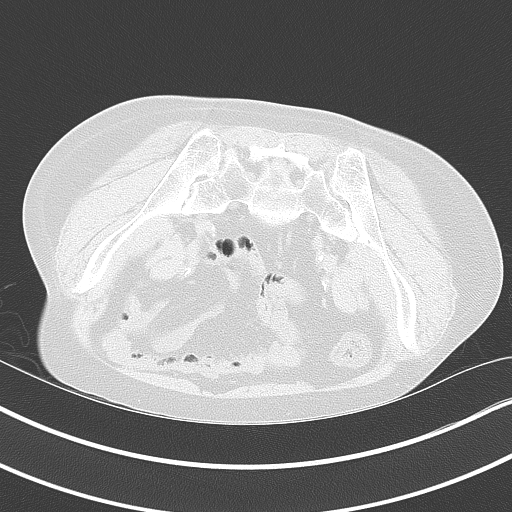
[im 11/30  lung]
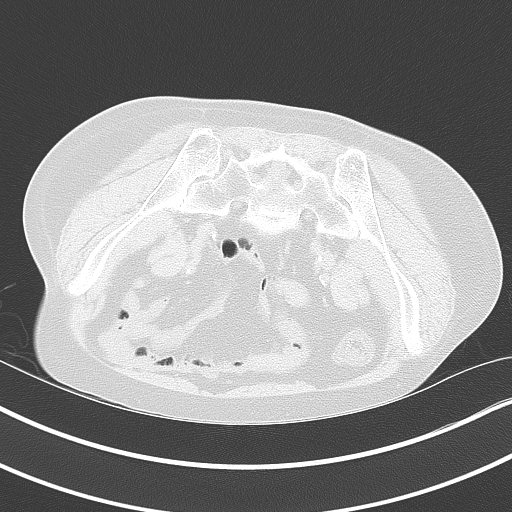
[im 13/30  lung]
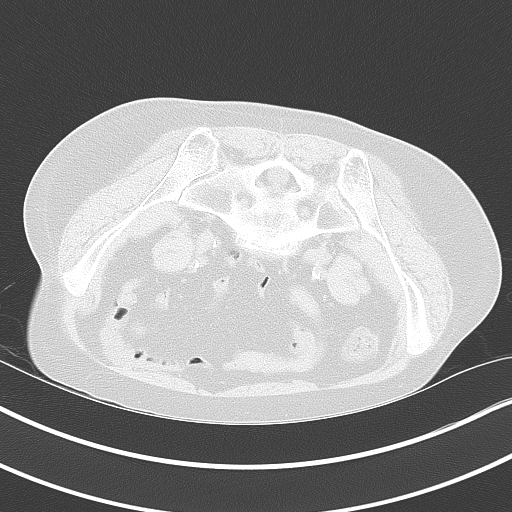
[im 14/30  lung]
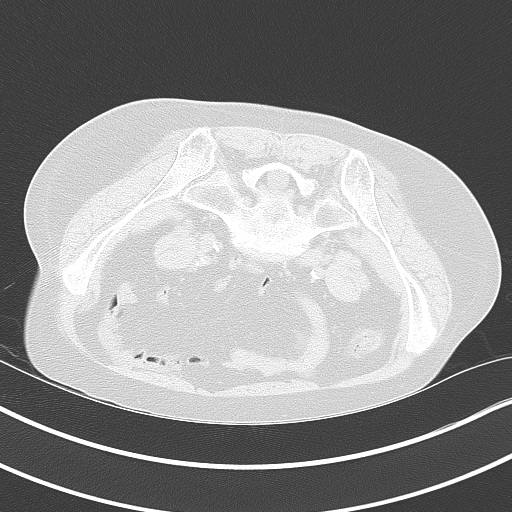
[im 16/30  mediastinal]
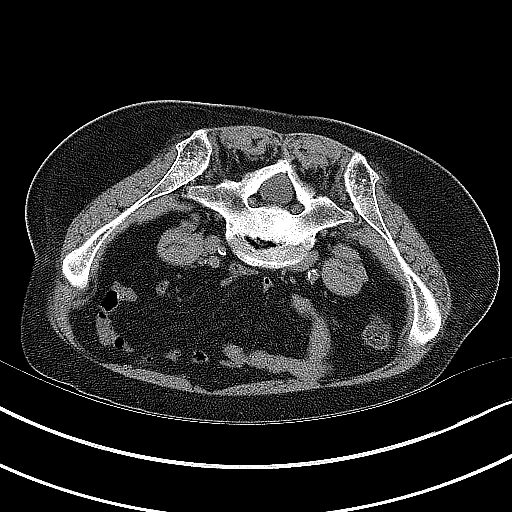
[im 16/30  lung]
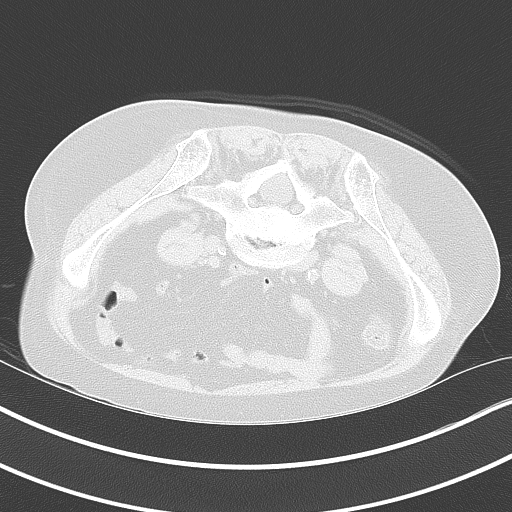
[im 19/30  lung]
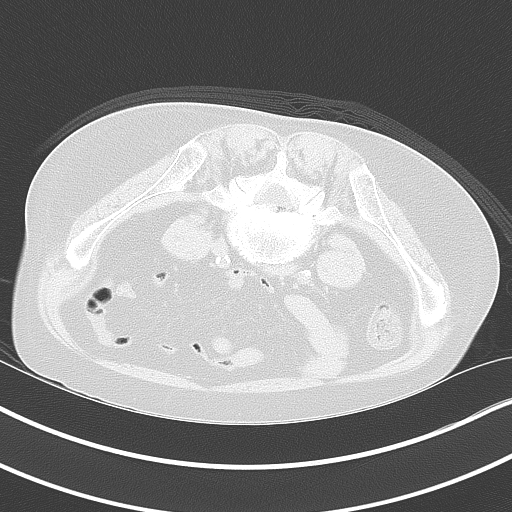
[im 20/30  lung]
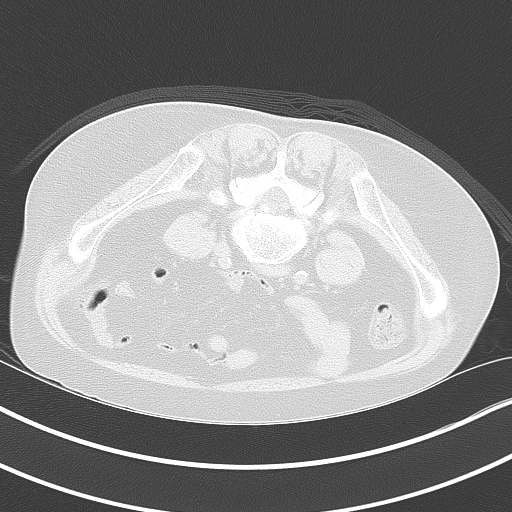
[im 21/30  lung]
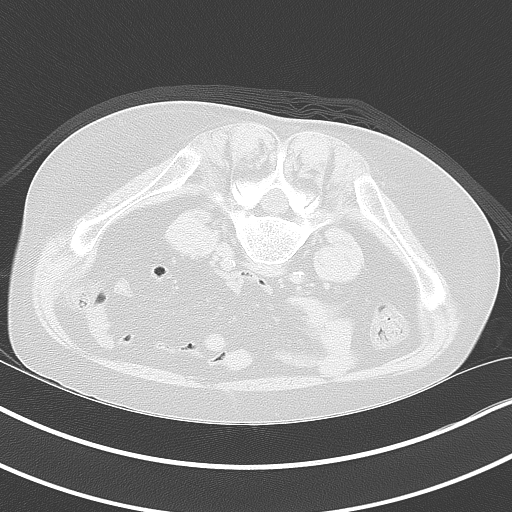
[im 23/30  mediastinal]
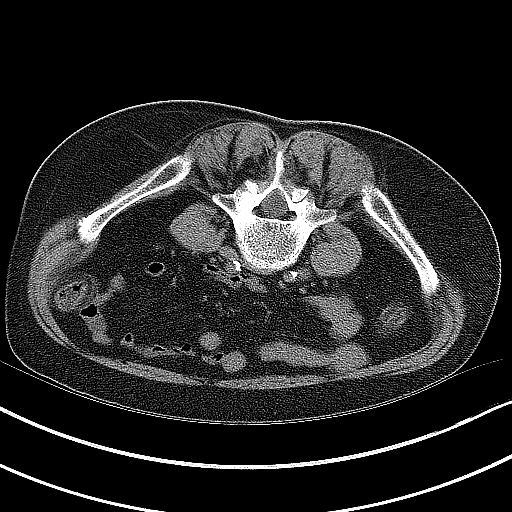
[im 23/30  lung]
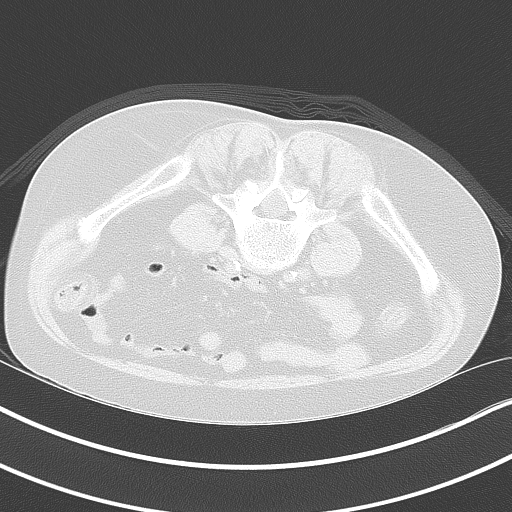
[im 26/30  lung]
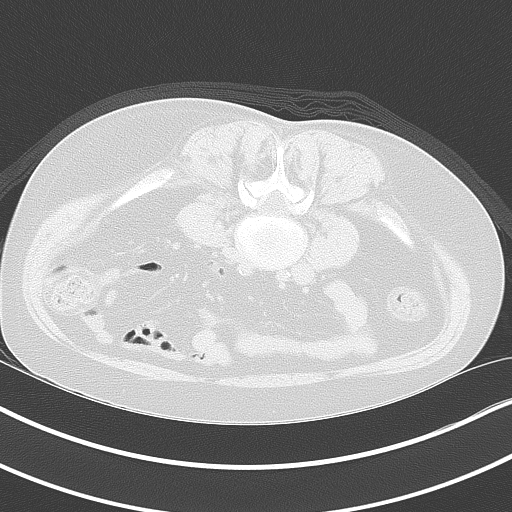
[im 28/30  lung]
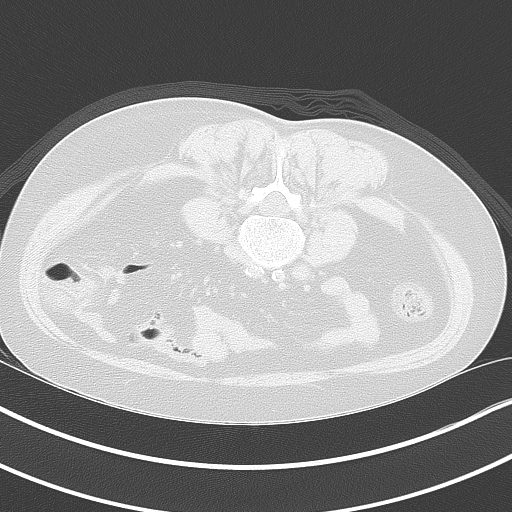

[15 of 31 positions shown; findings below may reference images not displayed]

IMPRESSION: 1. Technically successful CT guided right iliac bone core and
aspiration biopsy.

## 2017-10-21 ENCOUNTER — Inpatient Hospital Stay (HOSPITAL_COMMUNITY): Payer: Self-pay | Attending: Internal Medicine

## 2017-10-21 ENCOUNTER — Other Ambulatory Visit: Payer: Self-pay

## 2017-10-21 ENCOUNTER — Encounter (HOSPITAL_COMMUNITY): Payer: Self-pay

## 2017-10-21 DIAGNOSIS — Z452 Encounter for adjustment and management of vascular access device: Secondary | ICD-10-CM | POA: Insufficient documentation

## 2017-10-21 DIAGNOSIS — C8330 Diffuse large B-cell lymphoma, unspecified site: Secondary | ICD-10-CM | POA: Insufficient documentation

## 2017-10-21 MED ORDER — SODIUM CHLORIDE 0.9% FLUSH
10.0000 mL | INTRAVENOUS | Status: DC | PRN
  Administered 2017-10-21: 10 mL via INTRAVENOUS
  Filled 2017-10-21: qty 10

## 2017-10-21 MED ORDER — HEPARIN SOD (PORK) LOCK FLUSH 100 UNIT/ML IV SOLN
500.0000 [IU] | Freq: Once | INTRAVENOUS | Status: AC
  Administered 2017-10-21: 500 [IU] via INTRAVENOUS

## 2017-10-21 NOTE — Progress Notes (Signed)
Gabriella Love presented for Portacath access and flush.  Portacath located right chest wall accessed with  H 20 needle.  Good blood return present. Portacath flushed with 37ml NS and 500U/50ml Heparin and needle removed intact.  Procedure tolerated well and without incident.  Discharged ambulatory.

## 2017-11-29 IMAGING — CT NM PET TUM IMG RESTAG (PS) SKULL BASE T - THIGH
8 series · 25 of 25 positions shown · non-contrast
Comparison: 07/28/2016

CLINICAL DATA: Subsequent treatment strategy for B-cell lymphoma.

EXAM:
NUCLEAR MEDICINE PET SKULL BASE TO THIGH
TECHNIQUE: 5.8 mCi F-18 FDG was injected intravenously. Full-ring PET imaging
was performed from the skull base to thigh after the radiotracer. CT
data was obtained and used for attenuation correction and anatomic
localization.
FASTING BLOOD GLUCOSE:  Value: 115 mg/dl

[Series 3: pet sk_thigh ac · axial · 5.0mm · 4.07mm/px · z∈[-908,-100]mm · 4 of 203 slices shown]
[im 1/203]
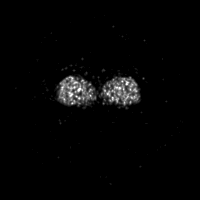
[im 68/203]
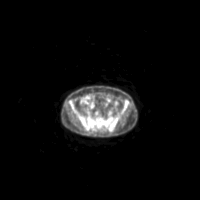
[im 135/203]
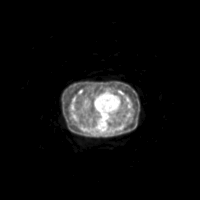
[im 203/203]
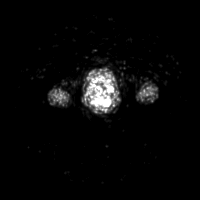

[Series 4: ct sk_thigh 5.0 b31f · axial · 5.0mm · 0.98mm/px · z∈[-908,-100]mm · 5 of 203 slices shown]
[im 1/203]
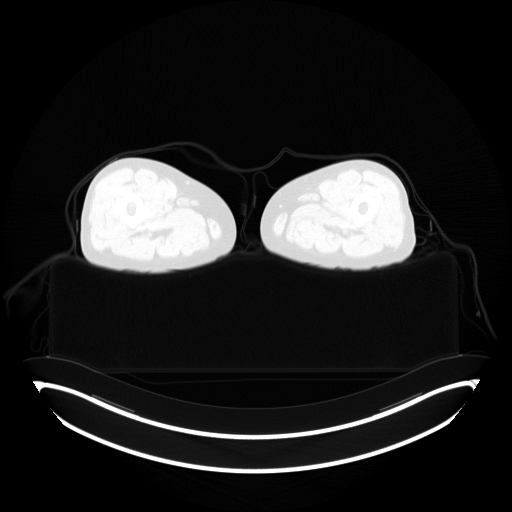
[im 51/203]
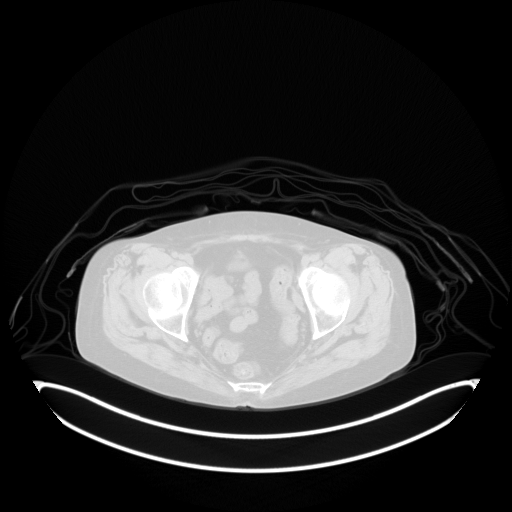
[im 102/203]
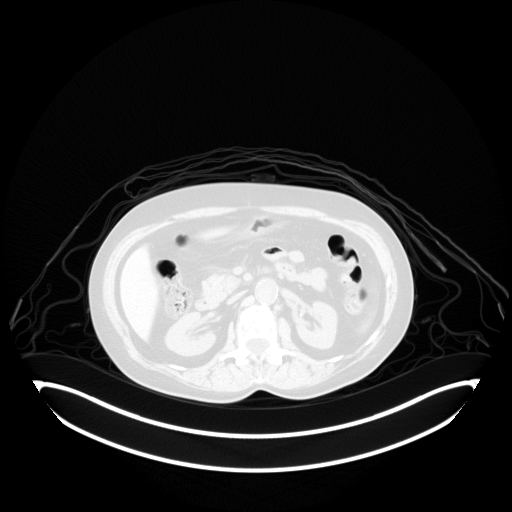
[im 152/203]
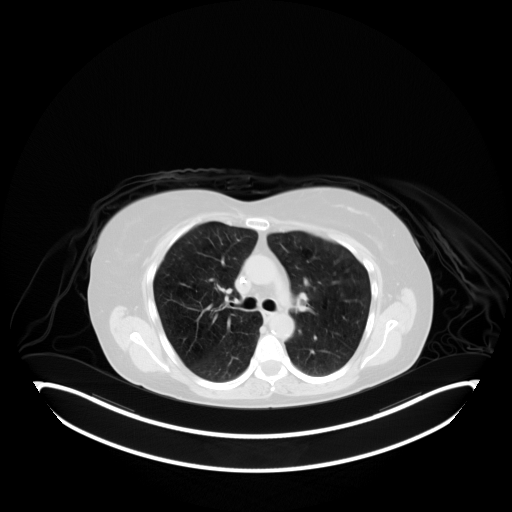
[im 203/203]
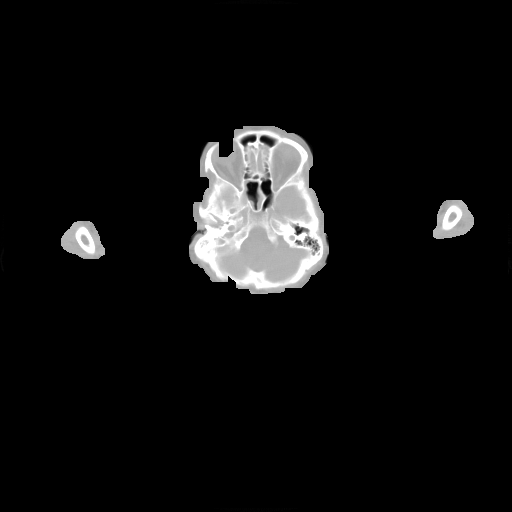

[Series 7: pet sk_thigh nac · axial · 5.0mm · 4.07mm/px · z∈[-908,-100]mm · 5 of 203 slices shown]
[im 1/203]
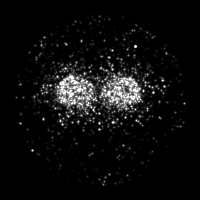
[im 51/203]
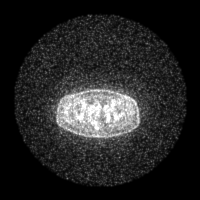
[im 102/203]
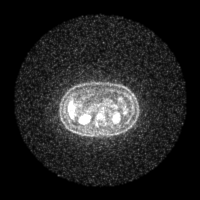
[im 152/203]
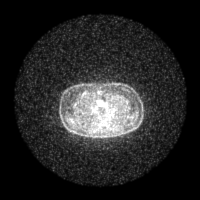
[im 203/203]
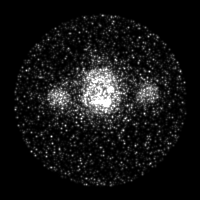

[Series 8: ct sk_thigh 5.0 b70f lung_bone · axial · 5.0mm · 0.59mm/px · z∈[-436,-204]mm · 2 of 59 slices shown]
[im 1/59  bone]
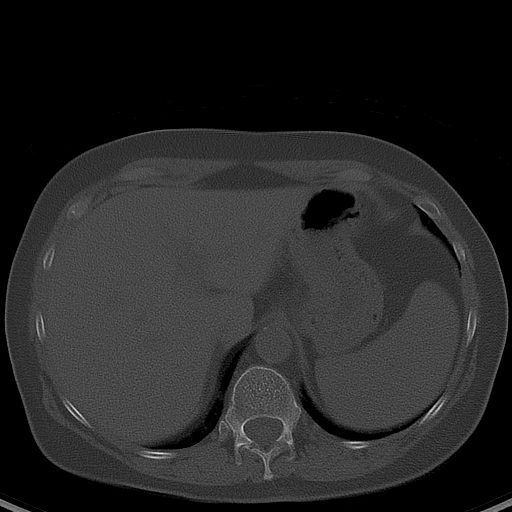
[im 59/59  bone]
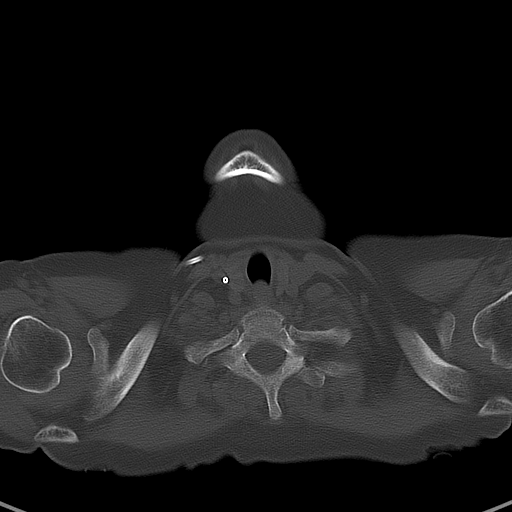

[Series 603: range-ct sk_thigh 5.0 (id)<alpha range> · 2 of 76 slices shown (1 of 2)]
[im 1/76]
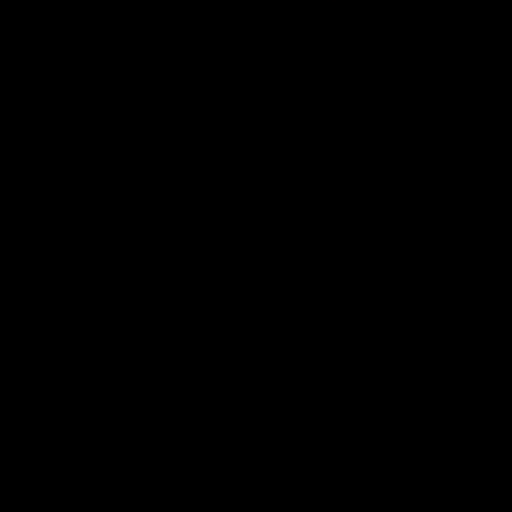
[im 76/76]
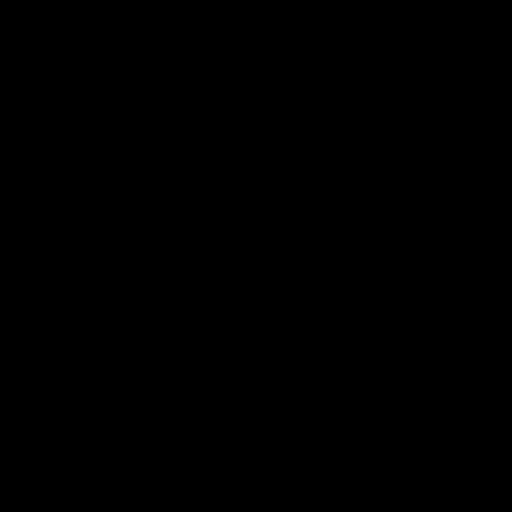

[Series 604: mip collection · coronal · 1.68mm/px · 1 of 32 slices shown]
[im 1/32]
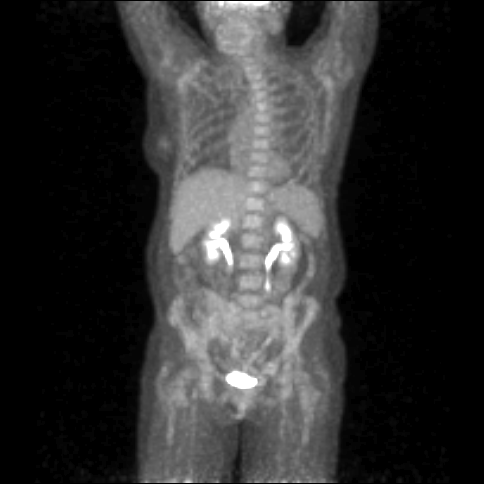

[Series 605: range-ct sk_thigh 5.0 (id)<alpha range> · 5 of 195 slices shown (2 of 2)]
[im 1/195]
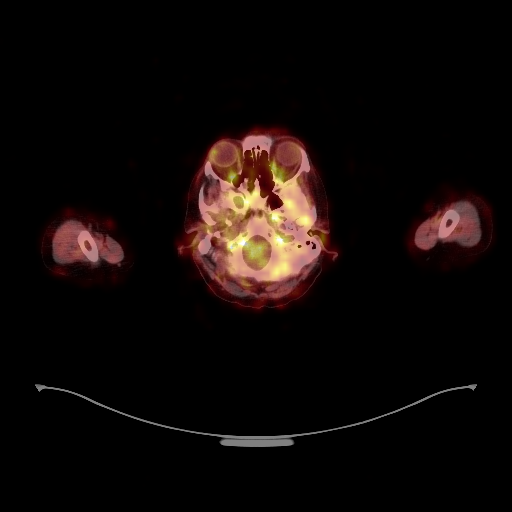
[im 49/195]
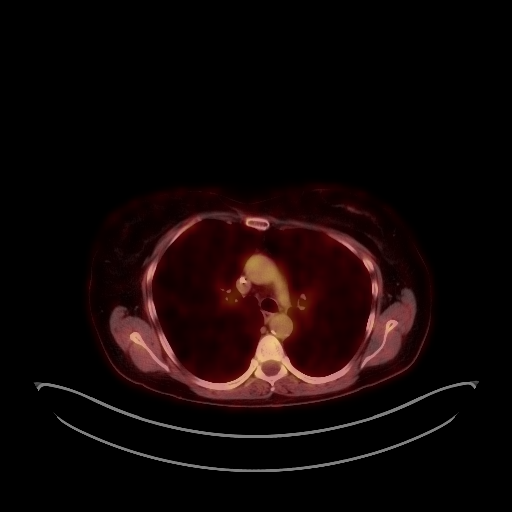
[im 98/195]
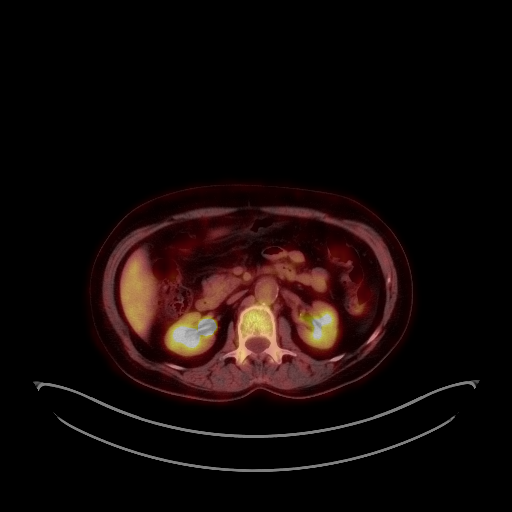
[im 146/195]
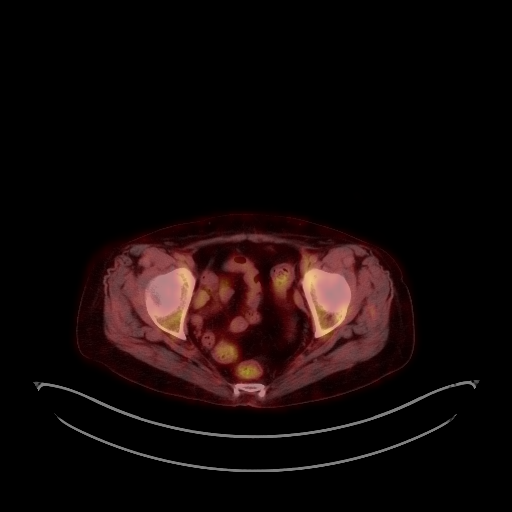
[im 195/195]
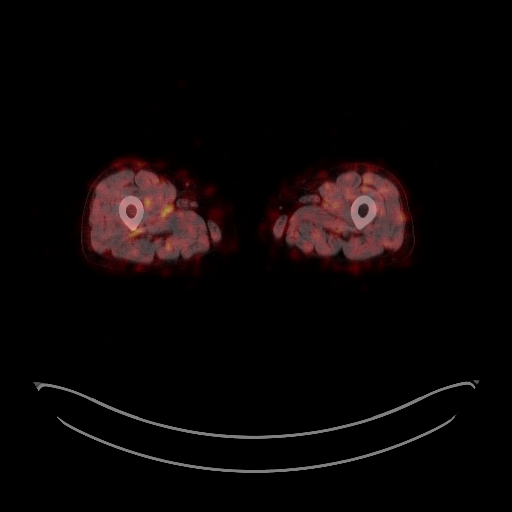

[Series 1090: results mm oncology reading · 5.0mm · 0.70mm/px · 1 of 1 slices shown]
[im 1/1]
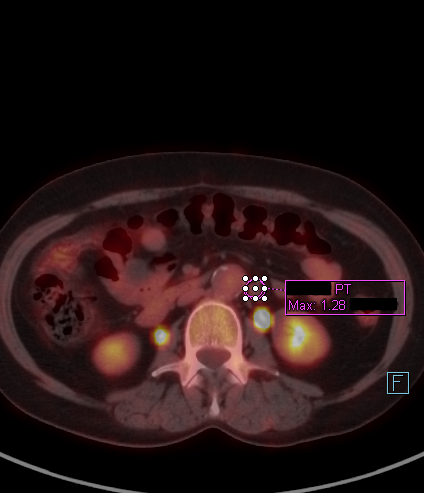

[25 of 25 positions shown; findings below may reference images not displayed]

FINDINGS: NECK

Prior neck activity has resolved.

Chronic right sphenoid sinusitis.  Suspected right mastoid effusion.

CHEST

No hypermetabolic mediastinal or hilar nodes. No suspicious
pulmonary nodules on the CT data.

Coronary, aortic arch, and branch vessel atherosclerotic vascular
disease. Severe emphysema.

ABDOMEN/PELVIS

Left periaortic lymph node on image 110/4 measures 0.9 cm in short
axis (formerly 1.2 cm) and has a maximum standard uptake value of
1.3 (formerly 9.4). The previously hypermetabolic left common iliac
node measures 7 mm in short axis (formerly 14 mm) and is no longer
hypermetabolic.

On the prior exam, concern was raised regarding distal appendiceal
activity. Based on the appearance on today' s exam, I suspect that
this activity was actually due to a mildly hypermetabolic right
external iliac lymph node adjacent to the appendix. This previous
hypermetabolic right external iliac lymph node has resolved.

No new hypermetabolic lymph nodes. Splenic size normal common no
splenic hypermetabolic activity.

Aortoiliac atherosclerotic vascular disease.  Cholecystectomy noted.

SKELETON

No focal hypermetabolic activity to suggest skeletal metastasis.
IMPRESSION: 1. Significantly improved appearance, with resolution of prior
hypermetabolic activity in all of the previously involved lymph
nodes. All lymph nodes are currently within normal size limits.
2. Other imaging findings of potential clinical significance:
Chronic right sphenoid sinusitis. Right mastoid effusion. Coronary,
aortic arch, and branch vessel atherosclerotic vascular disease.
Severe emphysema. Aortoiliac atherosclerotic vascular disease.

## 2017-12-16 ENCOUNTER — Encounter (HOSPITAL_COMMUNITY): Payer: Self-pay

## 2017-12-16 ENCOUNTER — Inpatient Hospital Stay (HOSPITAL_COMMUNITY): Payer: Self-pay | Attending: Internal Medicine

## 2017-12-16 ENCOUNTER — Other Ambulatory Visit: Payer: Self-pay

## 2017-12-16 VITALS — BP 118/67 | HR 92 | Temp 98.1°F | Resp 18

## 2017-12-16 DIAGNOSIS — Z880 Allergy status to penicillin: Secondary | ICD-10-CM | POA: Insufficient documentation

## 2017-12-16 DIAGNOSIS — I10 Essential (primary) hypertension: Secondary | ICD-10-CM | POA: Insufficient documentation

## 2017-12-16 DIAGNOSIS — G620 Drug-induced polyneuropathy: Secondary | ICD-10-CM | POA: Insufficient documentation

## 2017-12-16 DIAGNOSIS — E785 Hyperlipidemia, unspecified: Secondary | ICD-10-CM | POA: Insufficient documentation

## 2017-12-16 DIAGNOSIS — Z9221 Personal history of antineoplastic chemotherapy: Secondary | ICD-10-CM | POA: Insufficient documentation

## 2017-12-16 DIAGNOSIS — C8330 Diffuse large B-cell lymphoma, unspecified site: Secondary | ICD-10-CM | POA: Insufficient documentation

## 2017-12-16 DIAGNOSIS — Z95828 Presence of other vascular implants and grafts: Secondary | ICD-10-CM

## 2017-12-16 DIAGNOSIS — I2510 Atherosclerotic heart disease of native coronary artery without angina pectoris: Secondary | ICD-10-CM | POA: Insufficient documentation

## 2017-12-16 DIAGNOSIS — T451X5S Adverse effect of antineoplastic and immunosuppressive drugs, sequela: Secondary | ICD-10-CM | POA: Insufficient documentation

## 2017-12-16 DIAGNOSIS — I700 Atherosclerosis of aorta: Secondary | ICD-10-CM | POA: Insufficient documentation

## 2017-12-16 DIAGNOSIS — F17210 Nicotine dependence, cigarettes, uncomplicated: Secondary | ICD-10-CM | POA: Insufficient documentation

## 2017-12-16 DIAGNOSIS — F419 Anxiety disorder, unspecified: Secondary | ICD-10-CM | POA: Insufficient documentation

## 2017-12-16 DIAGNOSIS — I252 Old myocardial infarction: Secondary | ICD-10-CM | POA: Insufficient documentation

## 2017-12-16 DIAGNOSIS — K219 Gastro-esophageal reflux disease without esophagitis: Secondary | ICD-10-CM | POA: Insufficient documentation

## 2017-12-16 DIAGNOSIS — Z79899 Other long term (current) drug therapy: Secondary | ICD-10-CM | POA: Insufficient documentation

## 2017-12-16 DIAGNOSIS — R918 Other nonspecific abnormal finding of lung field: Secondary | ICD-10-CM | POA: Insufficient documentation

## 2017-12-16 DIAGNOSIS — Z7982 Long term (current) use of aspirin: Secondary | ICD-10-CM | POA: Insufficient documentation

## 2017-12-16 LAB — COMPREHENSIVE METABOLIC PANEL
ALT: 15 U/L (ref 14–54)
AST: 16 U/L (ref 15–41)
Albumin: 4.2 g/dL (ref 3.5–5.0)
Alkaline Phosphatase: 101 U/L (ref 38–126)
Anion gap: 10 (ref 5–15)
BILIRUBIN TOTAL: 0.7 mg/dL (ref 0.3–1.2)
BUN: 11 mg/dL (ref 6–20)
CO2: 27 mmol/L (ref 22–32)
CREATININE: 0.79 mg/dL (ref 0.44–1.00)
Calcium: 9.4 mg/dL (ref 8.9–10.3)
Chloride: 102 mmol/L (ref 101–111)
GFR calc Af Amer: >60 mL/min (ref >60)
Glucose, Bld: 69 mg/dL (ref 65–99)
POTASSIUM: 3.9 mmol/L (ref 3.5–5.1)
Sodium: 139 mmol/L (ref 135–145)
TOTAL PROTEIN: 6.7 g/dL (ref 6.5–8.1)

## 2017-12-16 LAB — LACTATE DEHYDROGENASE: LDH: 146 U/L (ref 98–192)

## 2017-12-16 LAB — CBC WITH DIFFERENTIAL/PLATELET
BASOS ABS: 0.1 10*3/uL (ref 0.0–0.1)
BASOS PCT: 1 %
EOS ABS: 0.1 10*3/uL (ref 0.0–0.7)
Eosinophils Relative: 2 %
HCT: 45.1 % (ref 36.0–46.0)
Hemoglobin: 15.1 g/dL — ABNORMAL HIGH (ref 12.0–15.0)
LYMPHS PCT: 16 %
Lymphs Abs: 0.9 10*3/uL (ref 0.7–4.0)
MCH: 32.8 pg (ref 26.0–34.0)
MCHC: 33.5 g/dL (ref 30.0–36.0)
MCV: 97.8 fL (ref 78.0–100.0)
Monocytes Absolute: 0.6 10*3/uL (ref 0.1–1.0)
Monocytes Relative: 10 %
Neutro Abs: 4 10*3/uL (ref 1.7–7.7)
Neutrophils Relative %: 71 %
PLATELETS: 224 10*3/uL (ref 150–400)
RBC: 4.61 MIL/uL (ref 3.87–5.11)
RDW: 13.2 % (ref 11.5–15.5)
WBC: 5.7 10*3/uL (ref 4.0–10.5)

## 2017-12-16 MED ORDER — SODIUM CHLORIDE 0.9% FLUSH
10.0000 mL | INTRAVENOUS | Status: DC | PRN
  Administered 2017-12-16: 10 mL via INTRAVENOUS
  Filled 2017-12-16: qty 10

## 2017-12-16 MED ORDER — HEPARIN SOD (PORK) LOCK FLUSH 100 UNIT/ML IV SOLN
500.0000 [IU] | Freq: Once | INTRAVENOUS | Status: AC
  Administered 2017-12-16: 500 [IU] via INTRAVENOUS
  Filled 2017-12-16: qty 5

## 2017-12-16 NOTE — Progress Notes (Signed)
Gabriella Love presented for Portacath access and flush.  Portacath located right chest wall accessed with  H 20 needle.  Good blood return present. Portacath flushed with 51ml NS and 500U/11ml Heparin and needle removed intact.  Procedure tolerated well and without incident.  Discharged ambulatory.

## 2017-12-21 ENCOUNTER — Ambulatory Visit (HOSPITAL_COMMUNITY)
Admission: RE | Admit: 2017-12-21 | Discharge: 2017-12-21 | Disposition: A | Discharge status: Home / Self Care | Payer: Self-pay | Source: Ambulatory Visit | Attending: Oncology | Admitting: Oncology

## 2017-12-21 ENCOUNTER — Telehealth (HOSPITAL_COMMUNITY): Payer: Self-pay

## 2017-12-21 DIAGNOSIS — R911 Solitary pulmonary nodule: Secondary | ICD-10-CM | POA: Insufficient documentation

## 2017-12-21 DIAGNOSIS — K6389 Other specified diseases of intestine: Secondary | ICD-10-CM | POA: Insufficient documentation

## 2017-12-21 DIAGNOSIS — R918 Other nonspecific abnormal finding of lung field: Secondary | ICD-10-CM | POA: Insufficient documentation

## 2017-12-21 DIAGNOSIS — C8330 Diffuse large B-cell lymphoma, unspecified site: Secondary | ICD-10-CM | POA: Insufficient documentation

## 2017-12-21 MED ORDER — IOPAMIDOL (ISOVUE-300) INJECTION 61%
100.0000 mL | Freq: Once | INTRAVENOUS | Status: AC | PRN
  Administered 2017-12-21: 100 mL via INTRAVENOUS

## 2017-12-21 NOTE — Telephone Encounter (Signed)
Received call report from [place] at [facility]iology concerning patient CT today. Dr. Raliegh Ip reviewed with results and said that he will go over with patient at her follow up scheduled on 12/24/2017.

## 2017-12-24 ENCOUNTER — Other Ambulatory Visit: Payer: Self-pay

## 2017-12-24 ENCOUNTER — Inpatient Hospital Stay (HOSPITAL_BASED_OUTPATIENT_CLINIC_OR_DEPARTMENT_OTHER): Payer: Self-pay | Admitting: Hematology

## 2017-12-24 ENCOUNTER — Encounter (HOSPITAL_COMMUNITY): Payer: Self-pay | Admitting: Hematology

## 2017-12-24 ENCOUNTER — Ambulatory Visit (HOSPITAL_COMMUNITY): Payer: Self-pay

## 2017-12-24 VITALS — BP 119/72 | HR 83 | Temp 97.8°F | Resp 18 | Wt 113.2 lb

## 2017-12-24 DIAGNOSIS — F419 Anxiety disorder, unspecified: Secondary | ICD-10-CM

## 2017-12-24 DIAGNOSIS — Z79899 Other long term (current) drug therapy: Secondary | ICD-10-CM

## 2017-12-24 DIAGNOSIS — C8330 Diffuse large B-cell lymphoma, unspecified site: Secondary | ICD-10-CM

## 2017-12-24 DIAGNOSIS — I10 Essential (primary) hypertension: Secondary | ICD-10-CM

## 2017-12-24 DIAGNOSIS — Z9221 Personal history of antineoplastic chemotherapy: Secondary | ICD-10-CM

## 2017-12-24 DIAGNOSIS — I252 Old myocardial infarction: Secondary | ICD-10-CM

## 2017-12-24 DIAGNOSIS — I2510 Atherosclerotic heart disease of native coronary artery without angina pectoris: Secondary | ICD-10-CM

## 2017-12-24 DIAGNOSIS — T451X5S Adverse effect of antineoplastic and immunosuppressive drugs, sequela: Secondary | ICD-10-CM

## 2017-12-24 DIAGNOSIS — K219 Gastro-esophageal reflux disease without esophagitis: Secondary | ICD-10-CM

## 2017-12-24 DIAGNOSIS — F17210 Nicotine dependence, cigarettes, uncomplicated: Secondary | ICD-10-CM

## 2017-12-24 DIAGNOSIS — G620 Drug-induced polyneuropathy: Secondary | ICD-10-CM

## 2017-12-24 DIAGNOSIS — Z7982 Long term (current) use of aspirin: Secondary | ICD-10-CM

## 2017-12-24 DIAGNOSIS — I700 Atherosclerosis of aorta: Secondary | ICD-10-CM

## 2017-12-24 DIAGNOSIS — R918 Other nonspecific abnormal finding of lung field: Secondary | ICD-10-CM

## 2017-12-24 DIAGNOSIS — E785 Hyperlipidemia, unspecified: Secondary | ICD-10-CM

## 2017-12-24 DIAGNOSIS — Z880 Allergy status to penicillin: Secondary | ICD-10-CM

## 2017-12-24 NOTE — Assessment & Plan Note (Signed)
1.  Stage IIIb large B-cell lymphoma: -Presentation with weight loss, night sweats, adenopathy in the neck, abdomen and right inguinal area -6 cycles of R-CHOP from 07/30/2016 through 11/12/2016 -Denies any B symptoms in the last 6 months.  Her LDH was within normal limits.  Physical exam was also within normal limits. -I have discussed the results of the CT scan of the chest, abdomen and pelvis which did not show any evidence of recurrence of lymphoma.  However there is thickening of her left-sided colon.  She is completely asymptomatic.  There are small lung nodules.  Patient smokes a pack a day of cigarettes since age 64.  We will follow-up on these subcentimeter lung nodules. -I will schedule her for CT scan and blood work in 6 months.  She will keep the port for 2 years.  2.  Left colon thickening: -Her last colonoscopy was in 1999.  Denies any bleeding per rectum or melena.  Denies any bowel changes.  Based on the CT scan findings, I have recommended a colonoscopy.  She would like to think about it.  3.  Neuropathy: -She has developed neuropathy while receiving R-CHOP chemotherapy in the hands and feet.  She was on gabapentin at that time.  She no longer takes it.  Right now all her numbness is gone except in the left foot between the great toe and second toe.

## 2017-12-24 NOTE — Patient Instructions (Signed)
[facility] at [facility] Discharge Instructions  Today you saw Dr. K.   Thank you for choosing [facility] at [facility] to provide your oncology and hematology care.  To afford each patient quality time with our provider, please arrive at least 15 minutes before your scheduled appointment time.   If you have a lab appointment with the Cancer Center please come in thru the  Main Entrance and check in at the main information desk  You need to re-schedule your appointment should you arrive 10 or more minutes late.  We strive to give you quality time with our providers, and arriving late affects you and other patients whose appointments are after yours.  Also, if you no show three or more times for appointments you may be dismissed from the clinic at the providers discretion.     Again, thank you for choosing [facility].  Our hope is that these requests will decrease the amount of time that you wait before being seen by our physicians.       _____________________________________________________________  Should you have questions after your visit to [facility], please contact our office at [phone] between the hours of 8:30 a.m. and 4:30 p.m.  Voicemails left after 4:30 p.m. will not be returned until the following business day.  For prescription refill requests, have your pharmacy contact our office.       Resources For Cancer Patients and their Caregivers ? American Cancer Society: Can assist with transportation, wigs, general needs, runs Look Good Feel Better.        1-888-227-6333 ? Cancer Care: Provides financial assistance, online support groups, medication/co-pay assistance.  1-800-813-HOPE (4673) ? [facility] Assists [place] cancer patients and their families through emotional , educational and financial support.  336-427-4357 ? [place] DSS Where to apply for food  stamps, Medicaid and utility assistance. 336-342-1394 ? [facility]: Transportation to medical appointments. 336-347-2287 ? Social Security Administration: May apply for disability if have a Stage IV cancer. 336-342-7796 1-800-772-1213 ? [place] Aging, Disability and Transit Services: Assists with nutrition, care and transit needs. 336-349-2343  Cancer Center Support Programs:   > Cancer Support Group  2nd Tuesday of the month 1pm-2pm, Journey Room   > Creative Journey  3rd Tuesday of the month 1130am-1pm, Journey Room    

## 2017-12-24 NOTE — Progress Notes (Signed)
Gabriella Love, [place] [id number]   CLINIC:  Medical Oncology/Hematology  PCP:  Gabriella Squibb, MD [place] [address] [phone]   REASON FOR VISIT:  Follow-up for large B-cell lymphoma.  CURRENT THERAPY: Surveillance.  BRIEF ONCOLOGIC HISTORY:    DLBCL (diffuse large B cell lymphoma) ([place])   07/13/2016 Initial Biopsy    L inguinal biopsy with Dr. 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 Grade (if applicable): High grade. Flow cytometry: [id number] reveals a monoclonal B-cell population with expression of CD10. Immunohistochemical stains: CD20, CD3, CD10, bcl-2, bcl-6, CD21, CD23, Ki-67. Touch preps/imprints: Mixture of large and smaller lymphocytes with irregular nuclear contours. Comments: Sections of lymph node reveal effacement of the architecture by a nodular proliferation of neoplastic appearing follicles. In many of the areas the follicles coalesce and form sheets of large cells. The more formed follicles predominately consist of numerous large centroblasts (>15 per hpf), representing a high grade (3B) follicular component. The diffuse areas are also composed of large cells but lack follicular dendritic networks (CD21, CD23). Immunohistochemistry reveals the atypical lymphocytes are positive for CD20, CD10, and bcl-6. They are negative for bcl-2. Ki-67 is elevated (30% overall). CD3 reveals residual surrounding T-cells. Flow cytometry ([id number]) reveals a monoclonal B-cell population with expression of CD10. 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 The case was called to Dr. Rosana Love on 07/16/2016.      07/23/2016 Echocardiogram   Left ventricle: Systolic function was normal. The estimated ejection fraction was in the range of 50% to 55%. Doppler parameters are consistent with abnormal left ventricular relaxation (grade 1 diastolic dysfunction).      07/24/2016 Procedure    Technically successful CT guided right iliac bone core and aspiration biopsy by IR.      07/28/2016 PET scan    1. Hypermetabolic lymphadenopathy in the neck, abdomen and right inguinal area as described above, consistent with known lymphoma. 2. Focal area of hypermetabolism in the appendix could be lymphomatous involvement. Recommend attention on follow-up scans. 3. Age advanced atherosclerotic calcifications involving the abdominal aorta and branch vessels and age advanced three-vessel coronary artery calcifications. 4. No obvious osseous involvement. 5. Emphysematous changes and pulmonary scarring.      07/28/2016 Pathology Results    Bone Marrow, Aspirate,Biopsy, and Clot, right iliac - NORMOCELLULAR BONE MARROW WITH TRILINEAGE HEMATOPOIESIS. PERIPHERAL BLOOD: - OCCASIONAL CIRCULATING ATYPICAL LYMPHOID CELLS.      07/28/2016 Pathology Results    Bone Marrow Flow Cytometry - NO MONOCLONAL B CELL POPULATION OR ABNORMAL T CELL PHENOTYPE IDENTIFIED.      07/30/2016 Pathology Results    Peripheral Blood Flow Cytometry - NO MONOCLONAL B-CELL POPULATION OR ABNORMAL T-CELL PHENOTYPE IDENTIFIED.      07/30/2016 -  Chemotherapy    The patient had DOXOrubicin (ADRIAMYCIN) chemo injection 78 mg, 50 mg/m2 = 78 mg, Intravenous,  Once, 2 of 6 cycles  palonosetron (ALOXI) injection 0.25 mg, 0.25 mg, Intravenous,  Once, 2 of 6 cycles  pegfilgrastim (NEULASTA ONPRO KIT) injection 6 mg, 6 mg, Subcutaneous, Once, 2 of 6 cycles  vinCRIStine (ONCOVIN) 2 mg in sodium chloride 0.9 % 50 mL chemo infusion, 2 mg, Intravenous,  Once, 2 of 6 cycles  riTUXimab (RITUXAN) 600 mg in sodium chloride 0.9 % 250 mL (1.9355 mg/mL) chemo infusion, 375 mg/m2 = 600  mg, Intravenous,  Once, 2 of 6 cycles  cyclophosphamide (CYTOXAN) 1,180 mg in sodium chloride 0.9 % 250 mL chemo infusion, 750 mg/m2 = 1,180 mg, Intravenous,  Once, 2 of 6 cycles  for chemotherapy treatment.        08/03/2016 Pathology Results    Cytogenetic lab results [facility]). Cytogenetic Analysis: Normal.       09/23/2016 PET scan    1. Significantly improved appearance, with resolution of prior hypermetabolic activity in all of the previously involved lymph nodes. All lymph nodes are currently within normal size limits. 2. Other imaging findings of potential clinical significance: Chronic right sphenoid sinusitis. Right mastoid effusion. Coronary, aortic arch, and branch vessel atherosclerotic vascular disease. Severe emphysema. Aortoiliac atherosclerotic vascular disease.      11/12/2016 -  Chemotherapy    Completed cycle 6 of RCHOP       12/11/2016 PET scan    1. No evidence of residual hypermetabolic lymphoma or adenopathy. 2. Age advanced coronary artery atherosclerosis. Recommend assessment of coronary risk factors and consideration of medical therapy. 3. Centrilobular emphysema.      06/18/2017 PET scan    IMPRESSION: 1. No new or recurrent hypermetabolic adenopathy in the neck, chest, abdomen, or pelvis. 2. There is potentially some mild wall thickening along a 6 cm segment of the distal ileum. Scattered accentuated metabolic activity in the bowel, likely physiologic. There is some fat deposition in the proximal colon wall, which is typically considered incidental although there is a weak association with inflammatory bowel disease. 3. Aortic Atherosclerosis (ICD10-I70.0) and Emphysema (ICD10-J43.9). Coronary atherosclerosis.        Love STAGING: Love Staging DLBCL (diffuse large B cell lymphoma) (HCC) Staging form: Hodgkin and Non-Hodgkin Lymphoma, AJCC 8th Edition - Clinical stage from 07/31/2016: Stage III (Diffuse large B-cell lymphoma) - Signed by  Gabriella Cancer, PA-C on 07/31/2016    INTERVAL HISTORY:  Gabriella Love 62 y.o. female returns for follow-up of her lymphoma.  Denies any fevers, night sweats or weight loss.  Denies palpating any lumps or bumps.  Continues to smoke 1 pack/day.  Denies any change in her bowel habits.  Mild fatigue is stable.  No recent hospitalizations or ER visits.  Her last colonoscopy was in 1999.  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  She no longer has any of those symptoms.  She reports one area of numbness between left great toe and second toe.  REVIEW OF SYSTEMS:  Review of Systems  Constitutional: Positive for fatigue.  Neurological: Positive for numbness.  All other systems reviewed and are negative.    PAST MEDICAL/SURGICAL HISTORY:  Past Medical History:  Diagnosis Date  . Anxiety   . Arthritis   . DLBCL (diffuse large B cell lymphoma) ([place]) 07/16/2016  . GERD (gastroesophageal reflux disease)   . Heart disease   . Hyperlipidemia   . Hypertension   . Myocardial infarction ([place]) 12/19/2009   released from cardiology   Past Surgical History:  Procedure Laterality Date  . CARDIAC CATHETERIZATION     cardiac stent  . CHOLECYSTECTOMY    . CORONARY STENT PLACEMENT  2011  . INGUINAL LYMPH NODE BIOPSY Left 07/13/2016   Procedure: EXCISIONAL BIOPSY OF LEFT INGUINAL LYMPH NODE;  Surgeon: Vickie Epley, MD;  Location: [facility];  Service: General;  Laterality: Left;  . PORTACATH PLACEMENT N/A 07/17/2016   Procedure: INSERTION  OF TUNNELED CENTRAL VENOUS CATHETER WITH SUBCUTANEOUS PORT;  Surgeon: Vickie Epley, MD;  Location: [facility];  Service: Vascular;  Laterality: N/A;  . TUBAL LIGATION       SOCIAL HISTORY:  Social History   Socioeconomic History  . Marital status: Single    Spouse name: Not on file  . Number of children: Not on file  . Years of education: Not on file  . Highest education level: Not on file  Occupational History  . Not on file  Social Needs  .  Financial resource strain: Not on file  . Food insecurity:    Worry: Not on file    Inability: Not on file  . Transportation needs:    Medical: Not on file    Non-medical: Not on file  Tobacco Use  . Smoking status: Current Every Day Smoker    Packs/day: 0.50    Years: 40.00    Pack years: 20.00    Types: Cigarettes  . Smokeless tobacco: Never Used  Substance and Sexual Activity  . Alcohol use: No  . Drug use: No  . Sexual activity: Yes    Birth control/protection: Post-menopausal  Lifestyle  . Physical activity:    Days per week: Not on file    Minutes per session: Not on file  . Stress: Not on file  Relationships  . Social connections:    Talks on phone: Not on file    Gets together: Not on file    Attends religious service: Not on file    Active member of club or organization: Not on file    Attends meetings of clubs or organizations: Not on file    Relationship status: Not on file  . Intimate partner violence:    Fear of current or ex partner: Not on file    Emotionally abused: Not on file    Physically abused: Not on file    Forced sexual activity: Not on file  Other Topics Concern  . Not on file  Social History Narrative  . Not on file    FAMILY HISTORY:  Family History  Problem Relation Age of Onset  . Love Mother        colon  . Depression Mother   . Heart disease Father   . Depression Sister   . Love Sister        leukemia  . Hyperlipidemia Brother   . Hypertension Brother     CURRENT MEDICATIONS:  Outpatient Encounter Medications as of 12/24/2017  Medication Sig  . ALPRAZolam (XANAX) 0.5 MG tablet Take 0.5 mg by mouth daily as needed for anxiety.  Marland Kitchen aspirin 325 MG tablet Take 325 mg by mouth daily.    . fluticasone (FLONASE) 50 MCG/ACT nasal spray Place 1 spray into both nostrils daily as needed for allergies.   Marland Kitchen lidocaine-prilocaine (EMLA) cream Apply a quarter size amount to affected area 1 hour prior to coming to chemotherapy.  .  nitroGLYCERIN (NITROSTAT) 0.4 MG SL tablet Place 0.4 mg under the tongue every 5 (five) minutes as needed for chest pain.   . [DISCONTINUED] buPROPion (WELLBUTRIN XL) 150 MG 24 hr tablet Take 150 mg by mouth every evening.  . [DISCONTINUED] gabapentin (NEURONTIN) 300 MG capsule Take 1 capsule (300 mg total) by mouth 3 (three) times daily.  . [DISCONTINUED] metoprolol succinate (TOPROL-XL) 25 MG 24 hr tablet Take 25 mg by mouth daily.    . [DISCONTINUED] pantoprazole (PROTONIX) 40 MG tablet Take 1 tablet (40 mg total)  by mouth daily.  . [DISCONTINUED] simvastatin (ZOCOR) 40 MG tablet Take 40 mg by mouth at bedtime.     No facility-administered encounter medications on file as of 12/24/2017.     ALLERGIES:  Allergies  Allergen Reactions  . Amoxicillin Nausea And Vomiting    Yeast Infections  Has patient had a PCN reaction causing immediate rash, facial/tongue/throat swelling, SOB or lightheadedness with hypotension: No Has patient had a PCN reaction causing severe rash involving mucus membranes or skin necrosis: No Has patient had a PCN reaction that required hospitalization No Has patient had a PCN reaction occurring within the last 10 years: No If all of the above answers are "NO", then may proceed with Cephalosporin use.   Marland Kitchen Choline Fenofibrate     REACTION: Abd pain and elevated Lipase  . Oxycodone-Acetaminophen Nausea And Vomiting  . Penicillins     Yeast infections, thrush     . Percocet [Oxycodone-Acetaminophen]      PHYSICAL EXAM:  ECOG Performance status: 1  Vitals:   12/24/17 1351  BP: 119/72  Pulse: 83  Resp: 18  Temp: 97.8 F (36.6 C)  SpO2: 98%   Filed Weights   12/24/17 1351  Weight: 113 lb 3.2 oz (51.3 kg)    Physical Exam HEENT shows oropharynx has no thrush or other lesions.  Chest is bilaterally clear to auscultation. Cardiovascular S1-S2 regular rate and rhythm. Abdomen is soft nontender with no palpable masses. Extremities no edema or  cyanosis. No palpable neck, axillary or inguinal adenopathy.  LABORATORY DATA:  I have reviewed the labs as listed.  CBC    Component Value Date/Time   WBC 5.7 12/16/2017 1130   RBC 4.61 12/16/2017 1130   HGB 15.1 (H) 12/16/2017 1130   HCT 45.1 12/16/2017 1130   PLT 224 12/16/2017 1130   MCV 97.8 12/16/2017 1130   MCH 32.8 12/16/2017 1130   MCHC 33.5 12/16/2017 1130   RDW 13.2 12/16/2017 1130   LYMPHSABS 0.9 12/16/2017 1130   MONOABS 0.6 12/16/2017 1130   EOSABS 0.1 12/16/2017 1130   BASOSABS 0.1 12/16/2017 1130   CMP Latest Ref Rng & Units 12/16/2017 06/22/2017 03/17/2017  Glucose 65 - 99 mg/dL 69 79 110(H)  BUN 6 - 20 mg/dL '11 10 11  ' Creatinine 0.44 - 1.00 mg/dL 0.79 0.64 0.64  Sodium 135 - 145 mmol/L 139 139 135  Potassium 3.5 - 5.1 mmol/L 3.9 3.7 4.2  Chloride 101 - 111 mmol/L 102 104 99(L)  CO2 22 - 32 mmol/L '27 26 27  ' Calcium 8.9 - 10.3 mg/dL 9.4 9.5 9.9  Total Protein 6.5 - 8.1 g/dL 6.7 6.3(L) 6.9  Total Bilirubin 0.3 - 1.2 mg/dL 0.7 0.6 0.5  Alkaline Phos 38 - 126 U/L 101 83 96  AST 15 - 41 U/L 16 14(L) 22  ALT 14 - 54 U/L 15 9(L) 22       DIAGNOSTIC IMAGING:  I have independently reviewed CT scan of the chest, abdomen and pelvis and agree with the radiology report.     ASSESSMENT & PLAN:   DLBCL (diffuse large B cell lymphoma) (HCC) 1.  Stage IIIb large B-cell lymphoma: -Presentation with weight loss, night sweats, adenopathy in the neck, abdomen and right inguinal area -6 cycles of R-CHOP from 07/30/2016 through 11/12/2016 -Denies any B symptoms in the last 6 months.  Her LDH was within normal limits.  Physical exam was also within normal limits. -I have discussed the results of the CT scan of the chest, abdomen  and pelvis which did not show any evidence of recurrence of lymphoma.  However there is thickening of her left-sided colon.  She is completely asymptomatic.  There are small lung nodules.  Patient smokes a pack a day of cigarettes since age 31.  We  will follow-up on these subcentimeter lung nodules. -I will schedule her for CT scan and blood work in 6 months.  She will keep the port for 2 years.  2.  Left colon thickening: -Her last colonoscopy was in 1999.  Denies any bleeding per rectum or melena.  Denies any bowel changes.  Based on the CT scan findings, I have recommended a colonoscopy.  She would like to think about it.  3.  Neuropathy: -She has developed neuropathy while receiving R-CHOP chemotherapy in the hands and feet.  She was on gabapentin at that time.  She no longer takes it.  Right now all her numbness is gone except in the left foot between the great toe and second toe.      Orders placed this encounter:  Orders Placed This Encounter  Procedures  . CT Abdomen Pelvis W Contrast  . CT Chest W Contrast  . Comprehensive metabolic panel  . CBC with Differential  . Lactate dehydrogenase      Derek Jack, MD [place] [phone]

## 2018-03-18 ENCOUNTER — Other Ambulatory Visit: Payer: Self-pay

## 2018-03-18 ENCOUNTER — Other Ambulatory Visit (HOSPITAL_COMMUNITY): Payer: Self-pay | Admitting: Nurse Practitioner

## 2018-03-18 ENCOUNTER — Encounter (HOSPITAL_COMMUNITY): Payer: Self-pay

## 2018-03-18 ENCOUNTER — Inpatient Hospital Stay (HOSPITAL_COMMUNITY): Payer: Self-pay | Attending: Internal Medicine

## 2018-03-18 DIAGNOSIS — C8330 Diffuse large B-cell lymphoma, unspecified site: Secondary | ICD-10-CM | POA: Insufficient documentation

## 2018-03-18 DIAGNOSIS — Z452 Encounter for adjustment and management of vascular access device: Secondary | ICD-10-CM | POA: Insufficient documentation

## 2018-03-18 DIAGNOSIS — G629 Polyneuropathy, unspecified: Secondary | ICD-10-CM

## 2018-03-18 MED ORDER — GABAPENTIN 100 MG PO CAPS: 100.0000 mg | ORAL_CAPSULE | Freq: Two times a day (BID) | ORAL | 0 refills | Status: DC

## 2018-03-18 MED ORDER — HEPARIN SOD (PORK) LOCK FLUSH 100 UNIT/ML IV SOLN
500.0000 [IU] | Freq: Once | INTRAVENOUS | Status: AC
  Administered 2018-03-18: 500 [IU] via INTRAVENOUS

## 2018-03-18 MED ORDER — SODIUM CHLORIDE 0.9% FLUSH
10.0000 mL | INTRAVENOUS | Status: DC | PRN
  Administered 2018-03-18: 10 mL via INTRAVENOUS
  Filled 2018-03-18: qty 10

## 2018-03-18 NOTE — Progress Notes (Signed)
Victoriana L Kiehn presented for Portacath access and flush. Portacath located right chest wall accessed with  H 20 needle.  Good blood return present. Portacath flushed with 8ml NS and 500U/72ml Heparin and needle removed intact.  Procedure tolerated well and without incident.  It is noted after accessing port that there is a small, circular area of missing skin at top of port site, approx 2 mm in diameter, where port is exposed.  Dr. Delton Coombes notified and order rec'd to schedule pt for port removal.  Pt notified of plan and is in agreement.  Advised to keep area clean and dry, and to report any s/s of infection at site.  Verbalizes understanding.

## 2018-03-23 ENCOUNTER — Other Ambulatory Visit (HOSPITAL_COMMUNITY): Payer: Self-pay | Admitting: *Deleted

## 2018-03-23 DIAGNOSIS — G629 Polyneuropathy, unspecified: Secondary | ICD-10-CM

## 2018-03-23 MED ORDER — GABAPENTIN 100 MG PO CAPS: 100.0000 mg | ORAL_CAPSULE | Freq: Two times a day (BID) | ORAL | 0 refills | Status: DC

## 2018-04-07 ENCOUNTER — Ambulatory Visit: Payer: Self-pay | Admitting: General Surgery

## 2018-05-10 ENCOUNTER — Encounter: Payer: Self-pay | Admitting: General Surgery

## 2018-05-10 ENCOUNTER — Ambulatory Visit (INDEPENDENT_AMBULATORY_CARE_PROVIDER_SITE_OTHER): Payer: Self-pay | Admitting: General Surgery

## 2018-05-10 VITALS — BP 145/83 | HR 92 | Temp 96.6°F | Resp 18 | Wt 114.6 lb

## 2018-05-10 DIAGNOSIS — C8330 Diffuse large B-cell lymphoma, unspecified site: Secondary | ICD-10-CM

## 2018-05-10 NOTE — Patient Instructions (Signed)
Implanted Port Removal Implanted port removal is a procedure to remove the port and catheter (port-a-cath) that is implanted under your skin. The port is a small disc under your skin that can be punctured with a needle. It is connected to a vein in your chest or neck by a small flexible tube (catheter). The port-a-cath is used for treatment through an IV tube and for taking blood samples. Your health care provider will remove the port-a-cath if:  You no longer need it for treatment.  It is not working properly.  The area around it gets infected.  Tell a health care provider about:  Any allergies you have.  All medicines you are taking, including vitamins, herbs, eye drops, creams, and over-the-counter medicines.  Any problems you or family members have had with anesthetic medicines.  Any blood disorders you have.  Any surgeries you have had.  Any medical conditions you have.  Whether you are pregnant or may be pregnant. What are the risks? Generally, this is a safe procedure. However, problems may occur, including:  Infection.  Bleeding.  Allergic reactions to anesthetic medicines.  Damage to nerves or blood vessels.  What happens before the procedure?  You will have: ? A physical exam. ? Blood tests. ? Imaging tests, including a chest X-ray.  Follow instructions from your health care provider about eating or drinking restrictions.  Ask your health care provider about: ? Changing or stopping your regular medicines. This is especially important if you are taking diabetes medicines or blood thinners. ? Taking medicines such as aspirin and ibuprofen. These medicines can thin your blood. Do not take these medicines before your procedure if your surgeon instructs you not to.  Ask your health care provider how your surgical site will be marked or identified.  You may be given antibiotic medicine to help prevent infection.  Plan to have someone take you home after the  procedure.  If you will be going home right after the procedure, plan to have someone stay with you for 24 hours. What happens during the procedure?  To reduce your risk of infection: ? Your health care team will wash or sanitize their hands. ? Your skin will be washed with soap.  You may be given one or more of the following: ? A medicine to help you relax (sedative). ? A medicine to numb the area (local anesthetic).  A small cut (incision) will be made at the site of your port-a-cath.  The port-a-cath and the catheter that has been inside your vein will gently be removed.  The incision will be closed with stitches (sutures), adhesive strips, or skin glue.  A bandage (dressing) will be placed over the incision. The procedure may vary among health care providers and hospitals. What happens after the procedure?  Your blood pressure, heart rate, breathing rate, and blood oxygen level will be monitored often until the medicines you were given have worn off.  Do not drive for 24 hours if you received a sedative. This information is not intended to replace advice given to you by your health care provider. Make sure you discuss any questions you have with your health care provider. Document Released: 06/24/2015 Document Revised: 12/19/2015 Document Reviewed: 04/17/2015 Elsevier Interactive Patient Education  2018 [employer]  

## 2018-05-11 NOTE — H&P (Signed)
Gabriella Love; [id number]; 09/18/1955   HPI Patient is a 62 year old white female who was referred to my care by Dr. Delton Coombes for Port-A-Cath removal.  She has finished with her chemotherapy for B-cell lymphoma and request Port-A-Cath removal.  She denies any tenderness at the Port-A-Cath site.  She has had a small scab over the medial aspect of the incision for some time now.  No drainage has been noted. Past Medical History:  Diagnosis Date  . Anxiety   . Arthritis   . DLBCL (diffuse large B cell lymphoma) ([place]) 07/16/2016  . GERD (gastroesophageal reflux disease)   . Heart disease   . Hyperlipidemia   . Hypertension   . Myocardial infarction ([place]) 12/19/2009   released from cardiology    Past Surgical History:  Procedure Laterality Date  . CARDIAC CATHETERIZATION     cardiac stent  . CHOLECYSTECTOMY    . CORONARY STENT PLACEMENT  2011  . INGUINAL LYMPH NODE BIOPSY Left 07/13/2016   Procedure: EXCISIONAL BIOPSY OF LEFT INGUINAL LYMPH NODE;  Surgeon: Vickie Epley, MD;  Location: [facility];  Service: General;  Laterality: Left;  . PORTACATH PLACEMENT N/A 07/17/2016   Procedure: INSERTION OF TUNNELED CENTRAL VENOUS CATHETER WITH SUBCUTANEOUS PORT;  Surgeon: Vickie Epley, MD;  Location: [facility];  Service: Vascular;  Laterality: N/A;  . TUBAL LIGATION      Family History  Problem Relation Age of Onset  . Cancer Mother        colon  . Depression Mother   . Heart disease Father   . Depression Sister   . Cancer Sister        leukemia  . Hyperlipidemia Brother   . Hypertension Brother     Current Outpatient Medications on File Prior to Visit  Medication Sig Dispense Refill  . ALPRAZolam (XANAX) 0.5 MG tablet Take 0.5 mg by mouth daily as needed for anxiety.    Marland Kitchen aspirin 325 MG tablet Take 325 mg by mouth daily.      . fluticasone (FLONASE) 50 MCG/ACT nasal spray Place 1 spray into both nostrils daily as needed for allergies.     Marland Kitchen gabapentin (NEURONTIN) 100 MG capsule  Take 1 capsule (100 mg total) by mouth 2 (two) times daily. 60 capsule 0   No current facility-administered medications on file prior to visit.     Allergies  Allergen Reactions  . Amoxicillin Nausea And Vomiting    Yeast Infections  Has patient had a PCN reaction causing immediate rash, facial/tongue/throat swelling, SOB or lightheadedness with hypotension: No Has patient had a PCN reaction causing severe rash involving mucus membranes or skin necrosis: No Has patient had a PCN reaction that required hospitalization No Has patient had a PCN reaction occurring within the last 10 years: No If all of the above answers are "NO", then may proceed with Cephalosporin use.   Marland Kitchen Choline Fenofibrate     REACTION: Abd pain and elevated Lipase  . Oxycodone-Acetaminophen Nausea And Vomiting  . Penicillins     Yeast infections, thrush     . Percocet [Oxycodone-Acetaminophen]     Social History   Substance and Sexual Activity  Alcohol Use No    Social History   Tobacco Use  Smoking Status Current Every Day Smoker  . Packs/day: 0.50  . Years: 40.00  . Pack years: 20.00  . Types: Cigarettes  Smokeless Tobacco Never Used    Review of Systems  Constitutional: Negative.   HENT: Positive for sinus pain.  Eyes: Negative.   Respiratory: Negative.   Cardiovascular: Negative.   Gastrointestinal: Negative.   Genitourinary: Positive for frequency.  Musculoskeletal: Negative.   Skin: Negative.   Neurological: Positive for sensory change.  Endo/Heme/Allergies: Negative.   Psychiatric/Behavioral: Negative.     Objective   Vitals:   05/10/18 1017  BP: (!) 145/83  Pulse: 92  Resp: 18  Temp: (!) 96.6 F (35.9 C)    Physical Exam  Constitutional: She is oriented to person, place, and time. She appears well-developed and well-nourished. No distress.  HENT:  Head: Normocephalic and atraumatic.  Cardiovascular: Normal rate, regular rhythm and normal heart sounds. Exam reveals no  gallop and no friction rub.  No murmur heard. Pulmonary/Chest: Effort normal and breath sounds normal. No stridor. No respiratory distress. She has no wheezes. She has no rales.  Port-A-Cath in place right upper chest.  A small scab is noted along the incision.  No induration or erythema noted.  Neurological: She is alert and oriented to person, place, and time.  Skin: Skin is warm and dry.  Vitals reviewed. Dr. Tomie China notes reviewed  Assessment  Large B-cell lymphoma, finished with chemotherapy Plan   Patient is scheduled for Port-A-Cath removal on 05/20/2018 in the minor procedure room.  The risks and benefits of the procedure including bleeding and infection were fully explained to the patient, who gave informed consent.

## 2018-05-11 NOTE — Progress Notes (Signed)
Gabriella Love; [id number]; 04-Feb-1956   HPI Patient is a 62 year old white female who was referred to my care by Dr. Delton Coombes for Port-A-Cath removal.  She has finished with her chemotherapy for B-cell lymphoma and request Port-A-Cath removal.  She denies any tenderness at the Port-A-Cath site.  She has had a small scab over the medial aspect of the incision for some time now.  No drainage has been noted. Past Medical History:  Diagnosis Date  . Anxiety   . Arthritis   . DLBCL (diffuse large B cell lymphoma) ([place]) 07/16/2016  . GERD (gastroesophageal reflux disease)   . Heart disease   . Hyperlipidemia   . Hypertension   . Myocardial infarction ([place]) 12/19/2009   released from cardiology    Past Surgical History:  Procedure Laterality Date  . CARDIAC CATHETERIZATION     cardiac stent  . CHOLECYSTECTOMY    . CORONARY STENT PLACEMENT  2011  . INGUINAL LYMPH NODE BIOPSY Left 07/13/2016   Procedure: EXCISIONAL BIOPSY OF LEFT INGUINAL LYMPH NODE;  Surgeon: Vickie Epley, MD;  Location: [facility];  Service: General;  Laterality: Left;  . PORTACATH PLACEMENT N/A 07/17/2016   Procedure: INSERTION OF TUNNELED CENTRAL VENOUS CATHETER WITH SUBCUTANEOUS PORT;  Surgeon: Vickie Epley, MD;  Location: [facility];  Service: Vascular;  Laterality: N/A;  . TUBAL LIGATION      Family History  Problem Relation Age of Onset  . Cancer Mother        colon  . Depression Mother   . Heart disease Father   . Depression Sister   . Cancer Sister        leukemia  . Hyperlipidemia Brother   . Hypertension Brother     Current Outpatient Medications on File Prior to Visit  Medication Sig Dispense Refill  . ALPRAZolam (XANAX) 0.5 MG tablet Take 0.5 mg by mouth daily as needed for anxiety.    Marland Kitchen aspirin 325 MG tablet Take 325 mg by mouth daily.      . fluticasone (FLONASE) 50 MCG/ACT nasal spray Place 1 spray into both nostrils daily as needed for allergies.     Marland Kitchen gabapentin (NEURONTIN) 100 MG capsule  Take 1 capsule (100 mg total) by mouth 2 (two) times daily. 60 capsule 0   No current facility-administered medications on file prior to visit.     Allergies  Allergen Reactions  . Amoxicillin Nausea And Vomiting    Yeast Infections  Has patient had a PCN reaction causing immediate rash, facial/tongue/throat swelling, SOB or lightheadedness with hypotension: No Has patient had a PCN reaction causing severe rash involving mucus membranes or skin necrosis: No Has patient had a PCN reaction that required hospitalization No Has patient had a PCN reaction occurring within the last 10 years: No If all of the above answers are "NO", then may proceed with Cephalosporin use.   Marland Kitchen Choline Fenofibrate     REACTION: Abd pain and elevated Lipase  . Oxycodone-Acetaminophen Nausea And Vomiting  . Penicillins     Yeast infections, thrush     . Percocet [Oxycodone-Acetaminophen]     Social History   Substance and Sexual Activity  Alcohol Use No    Social History   Tobacco Use  Smoking Status Current Every Day Smoker  . Packs/day: 0.50  . Years: 40.00  . Pack years: 20.00  . Types: Cigarettes  Smokeless Tobacco Never Used    Review of Systems  Constitutional: Negative.   HENT: Positive for sinus pain.  Eyes: Negative.   Respiratory: Negative.   Cardiovascular: Negative.   Gastrointestinal: Negative.   Genitourinary: Positive for frequency.  Musculoskeletal: Negative.   Skin: Negative.   Neurological: Positive for sensory change.  Endo/Heme/Allergies: Negative.   Psychiatric/Behavioral: Negative.     Objective   Vitals:   05/10/18 1017  BP: (!) 145/83  Pulse: 92  Resp: 18  Temp: (!) 96.6 F (35.9 C)    Physical Exam  Constitutional: She is oriented to person, place, and time. She appears well-developed and well-nourished. No distress.  HENT:  Head: Normocephalic and atraumatic.  Cardiovascular: Normal rate, regular rhythm and normal heart sounds. Exam reveals no  gallop and no friction rub.  No murmur heard. Pulmonary/Chest: Effort normal and breath sounds normal. No stridor. No respiratory distress. She has no wheezes. She has no rales.  Port-A-Cath in place right upper chest.  A small scab is noted along the incision.  No induration or erythema noted.  Neurological: She is alert and oriented to person, place, and time.  Skin: Skin is warm and dry.  Vitals reviewed. Dr. Tomie China notes reviewed  Assessment  Large B-cell lymphoma, finished with chemotherapy Plan   Patient is scheduled for Port-A-Cath removal on 05/20/2018 in the minor procedure room.  The risks and benefits of the procedure including bleeding and infection were fully explained to the patient, who gave informed consent.

## 2018-05-20 ENCOUNTER — Other Ambulatory Visit: Payer: Self-pay

## 2018-05-20 ENCOUNTER — Ambulatory Visit (HOSPITAL_COMMUNITY)
Admission: RE | Admit: 2018-05-20 | Discharge: 2018-05-20 | Disposition: A | Discharge status: Home / Self Care | Payer: Self-pay | Source: Ambulatory Visit | Attending: General Surgery | Admitting: General Surgery

## 2018-05-20 ENCOUNTER — Encounter (HOSPITAL_COMMUNITY): Payer: Self-pay | Admitting: *Deleted

## 2018-05-20 ENCOUNTER — Encounter (HOSPITAL_COMMUNITY)
Admission: RE | Disposition: A | Discharge status: Home / Self Care | Payer: Self-pay | Source: Ambulatory Visit | Attending: General Surgery

## 2018-05-20 DIAGNOSIS — I10 Essential (primary) hypertension: Secondary | ICD-10-CM | POA: Insufficient documentation

## 2018-05-20 DIAGNOSIS — I252 Old myocardial infarction: Secondary | ICD-10-CM | POA: Insufficient documentation

## 2018-05-20 DIAGNOSIS — Z7982 Long term (current) use of aspirin: Secondary | ICD-10-CM | POA: Insufficient documentation

## 2018-05-20 DIAGNOSIS — M1990 Unspecified osteoarthritis, unspecified site: Secondary | ICD-10-CM | POA: Insufficient documentation

## 2018-05-20 DIAGNOSIS — Z880 Allergy status to penicillin: Secondary | ICD-10-CM | POA: Insufficient documentation

## 2018-05-20 DIAGNOSIS — E785 Hyperlipidemia, unspecified: Secondary | ICD-10-CM | POA: Insufficient documentation

## 2018-05-20 DIAGNOSIS — Z452 Encounter for adjustment and management of vascular access device: Secondary | ICD-10-CM | POA: Insufficient documentation

## 2018-05-20 DIAGNOSIS — K219 Gastro-esophageal reflux disease without esophagitis: Secondary | ICD-10-CM | POA: Insufficient documentation

## 2018-05-20 DIAGNOSIS — Z9221 Personal history of antineoplastic chemotherapy: Secondary | ICD-10-CM | POA: Insufficient documentation

## 2018-05-20 DIAGNOSIS — C8330 Diffuse large B-cell lymphoma, unspecified site: Secondary | ICD-10-CM

## 2018-05-20 DIAGNOSIS — F419 Anxiety disorder, unspecified: Secondary | ICD-10-CM | POA: Insufficient documentation

## 2018-05-20 DIAGNOSIS — Z79899 Other long term (current) drug therapy: Secondary | ICD-10-CM | POA: Insufficient documentation

## 2018-05-20 DIAGNOSIS — Z955 Presence of coronary angioplasty implant and graft: Secondary | ICD-10-CM | POA: Insufficient documentation

## 2018-05-20 DIAGNOSIS — F17210 Nicotine dependence, cigarettes, uncomplicated: Secondary | ICD-10-CM | POA: Insufficient documentation

## 2018-05-20 HISTORY — PX: PORT-A-CATH REMOVAL: SHX5289

## 2018-05-20 SURGERY — MINOR REMOVAL PORT-A-CATH
Anesthesia: LOCAL | Laterality: Right

## 2018-05-20 MED ORDER — CHLORHEXIDINE GLUCONATE CLOTH 2 % EX PADS: 6.0000 | MEDICATED_PAD | Freq: Once | CUTANEOUS | Status: DC

## 2018-05-20 MED ORDER — LIDOCAINE HCL (PF) 1 % IJ SOLN
INTRAMUSCULAR | Status: AC
  Filled 2018-05-20: qty 30

## 2018-05-20 MED ORDER — LIDOCAINE HCL (PF) 1 % IJ SOLN
INTRAMUSCULAR | Status: DC | PRN
  Administered 2018-05-20: 5 mL

## 2018-05-20 SURGICAL SUPPLY — 22 items
ADH SKN CLS APL DERMABOND .7 (GAUZE/BANDAGES/DRESSINGS) ×1
CHLORAPREP W/TINT 10.5 ML (MISCELLANEOUS) ×2 IMPLANT
CLOTH BEACON ORANGE TIMEOUT ST (SAFETY) ×2 IMPLANT
DECANTER SPIKE VIAL GLASS SM (MISCELLANEOUS) ×2 IMPLANT
DERMABOND ADVANCED (GAUZE/BANDAGES/DRESSINGS) ×1
DERMABOND ADVANCED .7 DNX12 (GAUZE/BANDAGES/DRESSINGS) ×1 IMPLANT
DRAPE PROXIMA HALF (DRAPES) ×2 IMPLANT
ELECT REM PT RETURN 9FT ADLT (ELECTROSURGICAL) ×2
ELECTRODE REM PT RTRN 9FT ADLT (ELECTROSURGICAL) ×1 IMPLANT
GLOVE BIOGEL PI IND STRL 7.0 (GLOVE) ×1 IMPLANT
GLOVE BIOGEL PI INDICATOR 7.0 (GLOVE) ×1
GLOVE SURG SS PI 7.5 STRL IVOR (GLOVE) ×2 IMPLANT
GOWN STRL REUS W/TWL LRG LVL3 (GOWN DISPOSABLE) ×2 IMPLANT
NDL HYPO 25X1 1.5 SAFETY (NEEDLE) ×1 IMPLANT
NEEDLE HYPO 25X1 1.5 SAFETY (NEEDLE) ×2 IMPLANT
PENCIL HANDSWITCHING (ELECTRODE) IMPLANT
SPONGE GAUZE 2X2 8PLY STRL LF (GAUZE/BANDAGES/DRESSINGS) IMPLANT
SUT MNCRL AB 4-0 PS2 18 (SUTURE) IMPLANT
SUT VIC AB 3-0 SH 27 (SUTURE) ×2
SUT VIC AB 3-0 SH 27X BRD (SUTURE) ×1 IMPLANT
SYR CONTROL 10ML LL (SYRINGE) ×2 IMPLANT
TOWEL OR 17X26 4PK STRL BLUE (TOWEL DISPOSABLE) ×2 IMPLANT

## 2018-05-20 NOTE — Discharge Instructions (Signed)
Implanted Port Removal, Care After Refer to this sheet in the next few weeks. These instructions provide you with information about caring for yourself after your procedure. Your health care provider may also give you more specific instructions. Your treatment has been planned according to current medical practices, but problems sometimes occur. Call your health care provider if you have any problems or questions after your procedure. What can I expect after the procedure? After the procedure, it is common to have:  Soreness or pain near your incision.  Some swelling or bruising near your incision.  Follow these instructions at home: Medicines  Take over-the-counter and prescription medicines only as told by your health care provider.  If you were prescribed an antibiotic medicine, take it as told by your health care provider. Do not stop taking the antibiotic even if you start to feel better. Bathing  Do not take baths, swim, or use a hot tub until your health care provider approves. Ask your health care provider if you can take showers. You may only be allowed to take sponge baths for bathing. Incision care  Follow instructions from your health care provider about how to take care of your incision. Make sure you: ? Wash your hands with soap and water before you change your bandage (dressing). If soap and water are not available, use hand sanitizer. ? Change your dressing as told by your health care provider. ? Keep your dressing dry. ? Leave stitches (sutures), skin glue, or adhesive strips in place. These skin closures may need to stay in place for 2 weeks or longer. If adhesive strip edges start to loosen and curl up, you may trim the loose edges. Do not remove adhesive strips completely unless your health care provider tells you to do that.  Check your incision area every day for signs of infection. Check for: ? More redness, swelling, or pain. ? More fluid or  blood. ? Warmth. ? Pus or a bad smell. Driving  If you received a sedative, do not drive for 24 hours after the procedure.  If you did not receive a sedative, ask your health care provider when it is safe to drive. Activity  Return to your normal activities as told by your health care provider. Ask your health care provider what activities are safe for you.  Until your health care provider says it is safe: ? Do not lift anything that is heavier than 10 lb (4.5 kg). ? Do not do activities that involve lifting your arms over your head. General instructions  Do not use any tobacco products, such as cigarettes, chewing tobacco, and e-cigarettes. Tobacco can delay healing. If you need help quitting, ask your health care provider.  Keep all follow-up visits as told by your health care provider. This is important. Contact a health care provider if:  You have more redness, swelling, or pain around your incision.  You have more fluid or blood coming from your incision.  Your incision feels warm to the touch.  You have pus or a bad smell coming from your incision.  You have a fever.  You have pain that is not relieved by your pain medicine. Get help right away if:  You have chest pain.  You have difficulty breathing. This information is not intended to replace advice given to you by your health care provider. Make sure you discuss any questions you have with your health care provider. Document Released: 06/24/2015 Document Revised: 12/19/2015 Document Reviewed: 04/17/2015 Elsevier  Interactive Patient Education  2018 [place], Adult An incision is a cut that a doctor makes in your skin for surgery (for a procedure). Most times, these cuts are closed after surgery. Your cut from surgery may be closed with stitches (sutures), staples, skin glue, or skin tape (adhesive strips). You may need to return to your doctor to have stitches or staples taken out. This may  happen many days or many weeks after your surgery. The cut needs to be well cared for so it does not get infected. How to care for your cut Cut care  Follow instructions from your doctor about how to take care of your cut. Make sure you: ? Wash your hands with soap and water before you change your bandage (dressing). If you cannot use soap and water, use hand sanitizer. ? Change your bandage as told by your doctor. ? Leave stitches, skin glue, or skin tape in place. They may need to stay in place for 2 weeks or longer. If tape strips get loose and curl up, you may trim the loose edges. Do not remove tape strips completely unless your doctor says it is okay.  Check your cut area every day for signs of infection. Check for: ? More redness, swelling, or pain. ? More fluid or blood. ? Warmth. ? Pus or a bad smell.  Ask your doctor how to clean the cut. This may include: ? Using mild soap and water. ? Using a clean towel to pat the cut dry after you clean it. ? Putting a cream or ointment on the cut. Do this only as told by your doctor. ? Covering the cut with a clean bandage.  Ask your doctor when you can leave the cut uncovered.  Do not take baths, swim, or use a hot tub until your doctor says it is okay. Ask your doctor if you can take showers. You may only be allowed to take sponge baths for bathing. Medicines  If you were prescribed an antibiotic medicine, cream, or ointment, take the antibiotic or put it on the cut as told by your doctor. Do not stop taking or putting on the antibiotic even if your condition gets better.  Take over-the-counter and prescription medicines only as told by your doctor. General instructions  Limit movement around your cut. This helps healing. ? Avoid straining, lifting, or exercise for the first month, or for as long as told by your doctor. ? Follow instructions from your doctor about going back to your normal activities. ? Ask your doctor what  activities are safe.  Protect your cut from the sun when you are outside for the first 6 months, or for as long as told by your doctor. Put on sunscreen around the scar or cover up the scar.  Keep all follow-up visits as told by your doctor. This is important. Contact a doctor if:  Your have more redness, swelling, or pain around the cut.  You have more fluid or blood coming from the cut.  Your cut feels warm to the touch.  You have pus or a bad smell coming from the cut.  You have a fever or shaking chills.  You feel sick to your stomach (nauseous) or you throw up (vomit).  You are dizzy.  Your stitches or staples come undone. Get help right away if:  You have a red streak coming from your cut.  Your cut bleeds through the bandage and the bleeding does not stop  with gentle pressure.  The edges of your cut open up and separate.  You have very bad (severe) pain.  You have a rash.  You are confused.  You pass out (faint).  You have trouble breathing and you have a fast heartbeat. This information is not intended to replace advice given to you by your health care provider. Make sure you discuss any questions you have with your health care provider. Document Released: 10/05/2011 Document Revised: 03/20/2016 Document Reviewed: 03/20/2016 Elsevier Interactive Patient Education  2017 [place] SHOWER TOMORROW   REMAIN ELEVATED (HEAD UP) FOR 4 HOURS  CALL DR. Arnoldo Morale WITH ANY QUESTIONS OR CONCERNS

## 2018-05-20 NOTE — Op Note (Signed)
Patient:  Gabriella Love  DOB:  [birth year]  MRN:  [id number]   Preop Diagnosis: Diffuse large cell lymphoma, finished with chemotherapy  Postop Diagnosis: Same  Procedure: Port-A-Cath removal  Surgeon: Aviva Signs, MD  Anes: Local  Indications: Patient is a 62 year old white female who has finished chemotherapy for diffuse large cell lymphoma.  The risks and benefits of the procedure including bleeding and infection were fully explained to the patient, who gave informed consent.  Procedure note: The patient was placed in supine position.  The right upper chest over the Port-A-Cath was prepped and draped using usual sterile technique with ChloraPrep.  Surgical site confirmation was performed.  1% Xylocaine was used for local anesthesia.  An incision was made through the previous surgical scar.  The dissection was taken down to the port.  The Port-A-Cath was removed in total without difficulty.  It was disposed of.  Pressure was held over the right internal jugular insertion site for 5 minutes.  No hematoma was noted.  The incision was closed using a 4-0 Monocryl subcuticular suture.  Dermabond was applied.  All tape and needle counts were correct at the end of the procedure.  The patient was discharged from the minor procedure room in good and stable condition.  Complications: None  EBL: Minimal  Specimen: None

## 2018-05-20 NOTE — Interval H&P Note (Signed)
History and Physical Interval Note:  05/20/2018 9:32 AM  Gabriella Love  has presented today for surgery, with the diagnosis of b cell lymphoma  The various methods of treatment have been discussed with the patient and family. After consideration of risks, benefits and other options for treatment, the patient has consented to  Procedure(s): MINOR REMOVAL PORT-A-CATH (N/A) as a surgical intervention .  The patient's history has been reviewed, patient examined, no change in status, stable for surgery.  I have reviewed the patient's chart and labs.  Questions were answered to the patient's satisfaction.     Aviva Signs

## 2018-05-23 ENCOUNTER — Encounter (HOSPITAL_COMMUNITY): Payer: Self-pay | Admitting: General Surgery

## 2018-06-17 ENCOUNTER — Inpatient Hospital Stay (HOSPITAL_COMMUNITY): Payer: Self-pay | Attending: Hematology

## 2018-06-17 ENCOUNTER — Other Ambulatory Visit (HOSPITAL_COMMUNITY): Payer: Self-pay

## 2018-06-17 DIAGNOSIS — C8330 Diffuse large B-cell lymphoma, unspecified site: Secondary | ICD-10-CM | POA: Insufficient documentation

## 2018-06-17 LAB — CBC WITH DIFFERENTIAL/PLATELET
Abs Immature Granulocytes: 0.02 10*3/uL (ref 0.00–0.07)
Basophils Absolute: 0.1 10*3/uL (ref 0.0–0.1)
Basophils Relative: 1 %
EOS ABS: 0.1 10*3/uL (ref 0.0–0.5)
Eosinophils Relative: 2 %
HCT: 47.7 % — ABNORMAL HIGH (ref 36.0–46.0)
Hemoglobin: 15.3 g/dL — ABNORMAL HIGH (ref 12.0–15.0)
Immature Granulocytes: 0 %
Lymphocytes Relative: 18 %
Lymphs Abs: 1.2 10*3/uL (ref 0.7–4.0)
MCH: 31.4 pg (ref 26.0–34.0)
MCHC: 32.1 g/dL (ref 30.0–36.0)
MCV: 97.7 fL (ref 80.0–100.0)
MONO ABS: 0.5 10*3/uL (ref 0.1–1.0)
MONOS PCT: 8 %
Neutro Abs: 4.7 10*3/uL (ref 1.7–7.7)
Neutrophils Relative %: 71 %
Platelets: 277 10*3/uL (ref 150–400)
RBC: 4.88 MIL/uL (ref 3.87–5.11)
RDW: 13.3 % (ref 11.5–15.5)
WBC: 6.6 10*3/uL (ref 4.0–10.5)
nRBC: 0 % (ref 0.0–0.2)

## 2018-06-17 LAB — COMPREHENSIVE METABOLIC PANEL
ALBUMIN: 4.3 g/dL (ref 3.5–5.0)
ALK PHOS: 101 U/L (ref 38–126)
ALT: 12 U/L (ref 0–44)
AST: 17 U/L (ref 15–41)
Anion gap: 9 (ref 5–15)
BILIRUBIN TOTAL: 0.5 mg/dL (ref 0.3–1.2)
BUN: 7 mg/dL — AB (ref 8–23)
CALCIUM: 9.8 mg/dL (ref 8.9–10.3)
CO2: 27 mmol/L (ref 22–32)
Chloride: 103 mmol/L (ref 98–111)
Creatinine, Ser: 0.83 mg/dL (ref 0.44–1.00)
GFR calc Af Amer: >60 mL/min (ref >60)
GLUCOSE: 117 mg/dL — AB (ref 70–99)
Potassium: 4.6 mmol/L (ref 3.5–5.1)
Sodium: 139 mmol/L (ref 135–145)
Total Protein: 7.2 g/dL (ref 6.5–8.1)

## 2018-06-17 LAB — LACTATE DEHYDROGENASE: LDH: 135 U/L (ref 98–192)

## 2018-06-21 ENCOUNTER — Ambulatory Visit (HOSPITAL_COMMUNITY)
Admission: RE | Admit: 2018-06-21 | Discharge: 2018-06-21 | Disposition: A | Discharge status: Home / Self Care | Payer: Self-pay | Source: Ambulatory Visit | Attending: Hematology | Admitting: Hematology

## 2018-06-21 DIAGNOSIS — C8330 Diffuse large B-cell lymphoma, unspecified site: Secondary | ICD-10-CM | POA: Insufficient documentation

## 2018-06-21 MED ORDER — IOHEXOL 300 MG/ML  SOLN
75.0000 mL | Freq: Once | INTRAMUSCULAR | Status: AC | PRN
  Administered 2018-06-21: 75 mL via INTRAVENOUS

## 2018-06-27 ENCOUNTER — Encounter (HOSPITAL_COMMUNITY): Payer: Self-pay | Admitting: Hematology

## 2018-06-27 ENCOUNTER — Other Ambulatory Visit: Payer: Self-pay

## 2018-06-27 ENCOUNTER — Inpatient Hospital Stay (HOSPITAL_COMMUNITY): Payer: Self-pay | Attending: Hematology | Admitting: Hematology

## 2018-06-27 ENCOUNTER — Ambulatory Visit (HOSPITAL_COMMUNITY): Payer: Self-pay | Admitting: Hematology

## 2018-06-27 VITALS — BP 138/62 | HR 94 | Temp 98.0°F | Resp 16 | Wt 114.0 lb

## 2018-06-27 DIAGNOSIS — I2510 Atherosclerotic heart disease of native coronary artery without angina pectoris: Secondary | ICD-10-CM | POA: Insufficient documentation

## 2018-06-27 DIAGNOSIS — Z7982 Long term (current) use of aspirin: Secondary | ICD-10-CM | POA: Insufficient documentation

## 2018-06-27 DIAGNOSIS — G620 Drug-induced polyneuropathy: Secondary | ICD-10-CM | POA: Insufficient documentation

## 2018-06-27 DIAGNOSIS — J439 Emphysema, unspecified: Secondary | ICD-10-CM | POA: Insufficient documentation

## 2018-06-27 DIAGNOSIS — Z9221 Personal history of antineoplastic chemotherapy: Secondary | ICD-10-CM | POA: Insufficient documentation

## 2018-06-27 DIAGNOSIS — E785 Hyperlipidemia, unspecified: Secondary | ICD-10-CM | POA: Insufficient documentation

## 2018-06-27 DIAGNOSIS — F17210 Nicotine dependence, cigarettes, uncomplicated: Secondary | ICD-10-CM | POA: Insufficient documentation

## 2018-06-27 DIAGNOSIS — K219 Gastro-esophageal reflux disease without esophagitis: Secondary | ICD-10-CM | POA: Insufficient documentation

## 2018-06-27 DIAGNOSIS — I252 Old myocardial infarction: Secondary | ICD-10-CM | POA: Insufficient documentation

## 2018-06-27 DIAGNOSIS — G629 Polyneuropathy, unspecified: Secondary | ICD-10-CM

## 2018-06-27 DIAGNOSIS — I700 Atherosclerosis of aorta: Secondary | ICD-10-CM | POA: Insufficient documentation

## 2018-06-27 DIAGNOSIS — I10 Essential (primary) hypertension: Secondary | ICD-10-CM | POA: Insufficient documentation

## 2018-06-27 DIAGNOSIS — R531 Weakness: Secondary | ICD-10-CM | POA: Insufficient documentation

## 2018-06-27 DIAGNOSIS — R5383 Other fatigue: Secondary | ICD-10-CM | POA: Insufficient documentation

## 2018-06-27 DIAGNOSIS — C8330 Diffuse large B-cell lymphoma, unspecified site: Secondary | ICD-10-CM

## 2018-06-27 DIAGNOSIS — T451X5S Adverse effect of antineoplastic and immunosuppressive drugs, sequela: Secondary | ICD-10-CM | POA: Insufficient documentation

## 2018-06-27 DIAGNOSIS — Z880 Allergy status to penicillin: Secondary | ICD-10-CM | POA: Insufficient documentation

## 2018-06-27 DIAGNOSIS — M1990 Unspecified osteoarthritis, unspecified site: Secondary | ICD-10-CM | POA: Insufficient documentation

## 2018-06-27 DIAGNOSIS — Z79899 Other long term (current) drug therapy: Secondary | ICD-10-CM | POA: Insufficient documentation

## 2018-06-27 MED ORDER — INFLUENZA VAC SPLIT QUAD 0.5 ML IM SUSY: 0.5000 mL | PREFILLED_SYRINGE | Freq: Once | INTRAMUSCULAR | Status: DC

## 2018-06-27 MED ORDER — GABAPENTIN 100 MG PO CAPS: 100.0000 mg | ORAL_CAPSULE | Freq: Two times a day (BID) | ORAL | 0 refills | Status: DC

## 2018-06-27 NOTE — Assessment & Plan Note (Signed)
1.  Stage IIIb large B-cell lymphoma: -Presentation with weight loss, night sweats, adenopathy in the neck, abdomen and right inguinal area -6 cycles of R-CHOP from 07/30/2016 through 11/12/2016 - She denies any fevers, night sweats or weight loss in the last 6 months.  She had her port removed recently. -She denies any recurrent infections or recent hospitalizations. -She reports fatigue on exertion.  She reports that she is not able to do any activity for more than 2 hours.  She had to rest for another 2 hours before she gains her strength.  She also complains of loss of memory. - I have reviewed the results of the CT scan CAP dated 06/21/2018 which did not show any evidence of adenopathy in the chest, abdomen and pelvis.  There is innumerable new groundglass nodules in both lungs.  She does not have any active cough or expectoration at this time.  She is continuing to smoke cigarettes.  I have recommended repeating a CT scan of the chest in 4 months.  2.  Left colon thickening: -Her last colonoscopy was in 1999. - Her last CT scan showed thickening of the sigmoid colon area.  The current CT scan on 06/21/2018 showed improved appearance of the colon since 12/21/2017.  She denies any bleeding per rectum or melena.  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  3.  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 - We have renewed her gabapentin.

## 2018-06-27 NOTE — Progress Notes (Signed)
[place], [place] [address]   CLINIC:  Medical Oncology/Hematology  PCP:  Celene Squibb, MD [place] [address] [phone]   REASON FOR VISIT: Follow-up for Large B cell lymphoma  CURRENT THERAPY: Surveillance   BRIEF ONCOLOGIC HISTORY:    Lymphoma, large-cell, diffuse ([facility])   07/13/2016 Initial Biopsy    L inguinal biopsy with Dr. 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 Grade (if applicable): High grade. Flow cytometry: [id number] reveals a monoclonal B-cell population with expression of CD10. Immunohistochemical stains: CD20, CD3, CD10, bcl-2, bcl-6, CD21, CD23, Ki-67. Touch preps/imprints: Mixture of large and smaller lymphocytes with irregular nuclear contours. Comments: Sections of lymph node reveal effacement of the architecture by a nodular proliferation of neoplastic appearing follicles. In many of the areas the follicles coalesce and form sheets of large cells. The more formed follicles predominately consist of numerous large centroblasts (>15 per hpf), representing a high grade (3B) follicular component. The diffuse areas are also composed of large cells but lack follicular dendritic networks (CD21, CD23). Immunohistochemistry reveals the atypical lymphocytes are positive for CD20, CD10, and bcl-6. They are negative for bcl-2. Ki-67 is elevated (30% overall). CD3 reveals residual surrounding T-cells. Flow cytometry ([id number]) reveals a monoclonal B-cell population with expression of CD10. 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 The case was called to Dr. Rosana Hoes on 07/16/2016.    07/23/2016 Echocardiogram    Left ventricle:  Systolic function was normal. The estimated ejection fraction was in the range of 50% to 55%. Doppler parameters are consistent with abnormal left ventricular relaxation (grade 1 diastolic dysfunction).    07/24/2016 Procedure    Technically successful CT guided right iliac bone core and aspiration biopsy by IR.    07/28/2016 PET scan    1. Hypermetabolic lymphadenopathy in the neck, abdomen and right inguinal area as described above, consistent with known lymphoma. 2. Focal area of hypermetabolism in the appendix could be lymphomatous involvement. Recommend attention on follow-up scans. 3. Age advanced atherosclerotic calcifications involving the abdominal aorta and branch vessels and age advanced three-vessel coronary artery calcifications. 4. No obvious osseous involvement. 5. Emphysematous changes and pulmonary scarring.    07/28/2016 Pathology Results    Bone Marrow, Aspirate,Biopsy, and Clot, right iliac - NORMOCELLULAR BONE MARROW WITH TRILINEAGE HEMATOPOIESIS. PERIPHERAL BLOOD: - OCCASIONAL CIRCULATING ATYPICAL LYMPHOID CELLS.    07/28/2016 Pathology Results    Bone Marrow Flow Cytometry - NO MONOCLONAL B CELL POPULATION OR ABNORMAL T CELL PHENOTYPE IDENTIFIED.    07/30/2016 Pathology Results    Peripheral Blood Flow Cytometry - NO MONOCLONAL B-CELL POPULATION OR ABNORMAL T-CELL PHENOTYPE IDENTIFIED.    07/30/2016 -  Chemotherapy    The patient had DOXOrubicin (ADRIAMYCIN) chemo injection 78 mg, 50 mg/m2 = 78 mg, Intravenous,  Once, 2 of 6 cycles  palonosetron (ALOXI) injection 0.25 mg, 0.25 mg, Intravenous,  Once, 2 of 6 cycles  pegfilgrastim (NEULASTA ONPRO KIT) injection 6 mg, 6 mg, Subcutaneous, Once, 2 of 6 cycles  vinCRIStine (ONCOVIN) 2 mg in sodium chloride 0.9 % 50 mL chemo infusion, 2 mg, Intravenous,  Once, 2 of 6 cycles  riTUXimab (RITUXAN) 600 mg in sodium chloride 0.9 % 250 mL (1.9355 mg/mL) chemo infusion,  375 mg/m2 = 600 mg, Intravenous,  Once, 2 of 6  cycles  cyclophosphamide (CYTOXAN) 1,180 mg in sodium chloride 0.9 % 250 mL chemo infusion, 750 mg/m2 = 1,180 mg, Intravenous,  Once, 2 of 6 cycles  for chemotherapy treatment.      08/03/2016 Pathology Results    Cytogenetic lab results [facility]). Cytogenetic Analysis: Normal.     09/23/2016 PET scan    1. Significantly improved appearance, with resolution of prior hypermetabolic activity in all of the previously involved lymph nodes. All lymph nodes are currently within normal size limits. 2. Other imaging findings of potential clinical significance: Chronic right sphenoid sinusitis. Right mastoid effusion. Coronary, aortic arch, and branch vessel atherosclerotic vascular disease. Severe emphysema. Aortoiliac atherosclerotic vascular disease.    11/12/2016 -  Chemotherapy    Completed cycle 6 of RCHOP     12/11/2016 PET scan    1. No evidence of residual hypermetabolic lymphoma or adenopathy. 2. Age advanced coronary artery atherosclerosis. Recommend assessment of coronary risk factors and consideration of medical therapy. 3. Centrilobular emphysema.    06/18/2017 PET scan    IMPRESSION: 1. No new or recurrent hypermetabolic adenopathy in the neck, chest, abdomen, or pelvis. 2. There is potentially some mild wall thickening along a 6 cm segment of the distal ileum. Scattered accentuated metabolic activity in the bowel, likely physiologic. There is some fat deposition in the proximal colon wall, which is typically considered incidental although there is a weak association with inflammatory bowel disease. 3. Aortic Atherosclerosis (ICD10-I70.0) and Emphysema (ICD10-J43.9). Coronary atherosclerosis.      CANCER STAGING: Cancer Staging Lymphoma, large-cell, diffuse ([facility]) Staging form: Hodgkin and Non-Hodgkin Lymphoma, AJCC 8th Edition - Clinical stage from 07/31/2016: Stage III (Diffuse large B-cell lymphoma) - Signed by Baird Cancer, PA-C on 07/31/2016    INTERVAL  HISTORY:  Gabriella Love 62 y.o. female returns for routine follow-up for large B cell lymphoma. She is here today and has complaints of increasing fatigue. She has no energy to do all her normal activities. She is frustrated due to not having the energy she wants. She reports not having her full strength back either. She denies any new cough or hemoptysis. Denies any new pains or lumps present. Denies any fevers or recent infections. Denies any nausea or vomiting. She reports her appetite at 100%. She is not having a problem maintaining her weight.     REVIEW OF SYSTEMS:  Review of Systems  Constitutional: Positive for fatigue.  Neurological: Positive for extremity weakness.  All other systems reviewed and are negative.    PAST MEDICAL/SURGICAL HISTORY:  Past Medical History:  Diagnosis Date  . Anxiety   . Arthritis   . DLBCL (diffuse large B cell lymphoma) ([facility]) 07/16/2016  . GERD (gastroesophageal reflux disease)   . Heart disease   . Hyperlipidemia   . Hypertension   . Myocardial infarction ([facility]) 12/19/2009   released from cardiology   Past Surgical History:  Procedure Laterality Date  . CARDIAC CATHETERIZATION     cardiac stent  . CHOLECYSTECTOMY    . CORONARY STENT PLACEMENT  2011  . INGUINAL LYMPH NODE BIOPSY Left 07/13/2016   Procedure: EXCISIONAL BIOPSY OF LEFT INGUINAL LYMPH NODE;  Surgeon: Vickie Epley, MD;  Location: [facility];  Service: General;  Laterality: Left;  . PORT-A-CATH REMOVAL Right 05/20/2018   Procedure: MINOR REMOVAL PORT-A-CATH;  Surgeon: Aviva Signs, MD;  Location: [facility];  Service: General;  Laterality: Right;  . PORTACATH PLACEMENT N/A 07/17/2016  Procedure: INSERTION OF TUNNELED CENTRAL VENOUS CATHETER WITH SUBCUTANEOUS PORT;  Surgeon: Vickie Epley, MD;  Location: [facility];  Service: Vascular;  Laterality: N/A;  . TUBAL LIGATION       SOCIAL HISTORY:  Social History   Socioeconomic History  . Marital status: Single    Spouse name: Not on  file  . Number of children: Not on file  . Years of education: Not on file  . Highest education level: Not on file  Occupational History  . Not on file  Social Needs  . Financial resource strain: Not on file  . Food insecurity:    Worry: Not on file    Inability: Not on file  . Transportation needs:    Medical: Not on file    Non-medical: Not on file  Tobacco Use  . Smoking status: Current Every Day Smoker    Packs/day: 0.50    Years: 40.00    Pack years: 20.00    Types: Cigarettes  . Smokeless tobacco: Never Used  Substance and Sexual Activity  . Alcohol use: No  . Drug use: No  . Sexual activity: Yes    Birth control/protection: Post-menopausal  Lifestyle  . Physical activity:    Days per week: Not on file    Minutes per session: Not on file  . Stress: Not on file  Relationships  . Social connections:    Talks on phone: Not on file    Gets together: Not on file    Attends religious service: Not on file    Active member of club or organization: Not on file    Attends meetings of clubs or organizations: Not on file    Relationship status: Not on file  . Intimate partner violence:    Fear of current or ex partner: Not on file    Emotionally abused: Not on file    Physically abused: Not on file    Forced sexual activity: Not on file  Other Topics Concern  . Not on file  Social History Narrative  . Not on file    FAMILY HISTORY:  Family History  Problem Relation Age of Onset  . Cancer Mother        colon  . Depression Mother   . Heart disease Father   . Depression Sister   . Cancer Sister        leukemia  . Hyperlipidemia Brother   . Hypertension Brother     CURRENT MEDICATIONS:  Outpatient Encounter Medications as of 06/27/2018  Medication Sig  . acetaminophen (TYLENOL) 650 MG CR tablet Take 650-1,300 mg by mouth every 8 (eight) hours as needed for pain (back pain.).  Marland Kitchen aspirin EC 81 MG tablet Take 81 mg by mouth daily.  . fluticasone (FLONASE) 50  MCG/ACT nasal spray Place 1 spray into both nostrils every evening.   . gabapentin (NEURONTIN) 100 MG capsule Take 1 capsule (100 mg total) by mouth 2 (two) times daily.  . [DISCONTINUED] gabapentin (NEURONTIN) 100 MG capsule Take 1 capsule (100 mg total) by mouth 2 (two) times daily.  Marland Kitchen ALPRAZolam (XANAX) 0.5 MG tablet Take 0.5 mg by mouth daily as needed for anxiety.   Facility-Administered Encounter Medications as of 06/27/2018  Medication  . Influenza vac split quadrivalent PF (FLUARIX) injection 0.5 mL    ALLERGIES:  Allergies  Allergen Reactions  . Amoxicillin Nausea And Vomiting    Yeast Infections  Has patient had a PCN reaction causing immediate rash, facial/tongue/throat swelling, SOB or  lightheadedness with hypotension: No Has patient had a PCN reaction causing severe rash involving mucus membranes or skin necrosis: No Has patient had a PCN reaction that required hospitalization No Has patient had a PCN reaction occurring within the last 10 years: No If all of the above answers are "NO", then may proceed with Cephalosporin use.   Marland Kitchen Choline Fenofibrate     REACTION: Abd pain and elevated Lipase  . Penicillins     Yeast infections, thrush  Has patient had a PCN reaction causing immediate rash, facial/tongue/throat swelling, SOB or lightheadedness with hypotension: No Has patient had a PCN reaction causing severe rash involving mucus membranes or skin necrosis: No Has patient had a PCN reaction that required hospitalization No Has patient had a PCN reaction occurring within the last 10 years: No If all of the above answers are "NO", then may proceed with Cephalosporin use.   Marland Kitchen Percocet [Oxycodone-Acetaminophen] Nausea And Vomiting     PHYSICAL EXAM:  ECOG Performance status: 1  Vitals:   06/27/18 1104  BP: 138/62  Pulse: 94  Resp: 16  Temp: 98 F (36.7 C)  SpO2: 98%   Filed Weights   06/27/18 1104  Weight: 114 lb (51.7 kg)    Physical Exam  Constitutional:  She is oriented to person, place, and time. She appears well-developed and well-nourished.  Cardiovascular: Normal rate, regular rhythm and normal heart sounds.  Pulmonary/Chest: Effort normal and breath sounds normal.  Musculoskeletal: Normal range of motion.  Neurological: She is alert and oriented to person, place, and time.  Skin: Skin is warm and dry.  Psychiatric: She has a normal mood and affect. Her behavior is normal. Judgment and thought content normal.     LABORATORY DATA:  I have reviewed the labs as listed.  CBC    Component Value Date/Time   WBC 6.6 06/17/2018 1200   RBC 4.88 06/17/2018 1200   HGB 15.3 (H) 06/17/2018 1200   HCT 47.7 (H) 06/17/2018 1200   PLT 277 06/17/2018 1200   MCV 97.7 06/17/2018 1200   MCH 31.4 06/17/2018 1200   MCHC 32.1 06/17/2018 1200   RDW 13.3 06/17/2018 1200   LYMPHSABS 1.2 06/17/2018 1200   MONOABS 0.5 06/17/2018 1200   EOSABS 0.1 06/17/2018 1200   BASOSABS 0.1 06/17/2018 1200   CMP Latest Ref Rng & Units 06/17/2018 12/16/2017 06/22/2017  Glucose 70 - 99 mg/dL 117(H) 69 79  BUN 8 - 23 mg/dL 7(L) 11 10  Creatinine 0.44 - 1.00 mg/dL 0.83 0.79 0.64  Sodium 135 - 145 mmol/L 139 139 139  Potassium 3.5 - 5.1 mmol/L 4.6 3.9 3.7  Chloride 98 - 111 mmol/L 103 102 104  CO2 22 - 32 mmol/L '27 27 26  ' Calcium 8.9 - 10.3 mg/dL 9.8 9.4 9.5  Total Protein 6.5 - 8.1 g/dL 7.2 6.7 6.3(L)  Total Bilirubin 0.3 - 1.2 mg/dL 0.5 0.7 0.6  Alkaline Phos 38 - 126 U/L 101 101 83  AST 15 - 41 U/L 17 16 14(L)  ALT 0 - 44 U/L 12 15 9(L)       DIAGNOSTIC IMAGING:  I have independently reviewed the scans and discussed with the patient.  I have reviewed Francene Finders, NP's note and agree with the documentation.  I personally performed a face-to-face visit, made revisions and my assessment and plan is as follows.      ASSESSMENT & PLAN:   Lymphoma, large-cell, diffuse ([facility]) 1.  Stage IIIb large B-cell lymphoma: -Presentation with weight loss,  night  sweats, adenopathy in the neck, abdomen and right inguinal area -6 cycles of R-CHOP from 07/30/2016 through 11/12/2016 - She denies any fevers, night sweats or weight loss in the last 6 months.  She had her port removed recently. -She denies any recurrent infections or recent hospitalizations. -She reports fatigue on exertion.  She reports that she is not able to do any activity for more than 2 hours.  She had to rest for another 2 hours before she gains her strength.  She also complains of loss of memory. - I have reviewed the results of the CT scan CAP dated 06/21/2018 which did not show any evidence of adenopathy in the chest, abdomen and pelvis.  There is innumerable new groundglass nodules in both lungs.  She does not have any active cough or expectoration at this time.  She is continuing to smoke cigarettes.  I have recommended repeating a CT scan of the chest in 4 months.  2.  Left colon thickening: -Her last colonoscopy was in 1999. - Her last CT scan showed thickening of the sigmoid colon area.  The current CT scan on 06/21/2018 showed improved appearance of the colon since 12/21/2017.  She denies any bleeding per rectum or melena.  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  3.  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 - We have renewed her gabapentin.        Orders placed this encounter:  Orders Placed This Encounter  Procedures  . CT Chest W Contrast  . Lactate dehydrogenase  . CBC with Differential/Platelet  . Comprehensive metabolic panel      Derek Jack, MD [place] [phone]

## 2018-06-27 NOTE — Patient Instructions (Signed)
[facility] at [facility] Discharge Instructions     Thank you for choosing [facility] at [facility] to provide your oncology and hematology care.  To afford each patient quality time with our provider, please arrive at least 15 minutes before your scheduled appointment time.   If you have a lab appointment with the Cancer Center please come in thru the  Main Entrance and check in at the main information desk  You need to re-schedule your appointment should you arrive 10 or more minutes late.  We strive to give you quality time with our providers, and arriving late affects you and other patients whose appointments are after yours.  Also, if you no show three or more times for appointments you may be dismissed from the clinic at the providers discretion.     Again, thank you for choosing [facility].  Our hope is that these requests will decrease the amount of time that you wait before being seen by our physicians.       _____________________________________________________________  Should you have questions after your visit to [facility], please contact our office at [phone] between the hours of 8:00 a.m. and 4:30 p.m.  Voicemails left after 4:00 p.m. will not be returned until the following business day.  For prescription refill requests, have your pharmacy contact our office and allow 72 hours.    Cancer Center Support Programs:   > Cancer Support Group  2nd Tuesday of the month 1pm-2pm, Journey Room    

## 2018-07-31 ENCOUNTER — Other Ambulatory Visit: Payer: Self-pay

## 2018-07-31 ENCOUNTER — Emergency Department (HOSPITAL_COMMUNITY)
Admission: EM | Admit: 2018-07-31 | Discharge: 2018-07-31 | Disposition: A | Discharge status: Home / Self Care | Payer: Medicaid Other | Attending: Emergency Medicine | Admitting: Emergency Medicine

## 2018-07-31 ENCOUNTER — Emergency Department (HOSPITAL_COMMUNITY): Payer: Medicaid Other

## 2018-07-31 ENCOUNTER — Encounter (HOSPITAL_COMMUNITY): Payer: Self-pay | Admitting: *Deleted

## 2018-07-31 DIAGNOSIS — Z7982 Long term (current) use of aspirin: Secondary | ICD-10-CM | POA: Diagnosis not present

## 2018-07-31 DIAGNOSIS — Z79899 Other long term (current) drug therapy: Secondary | ICD-10-CM | POA: Diagnosis not present

## 2018-07-31 DIAGNOSIS — F17210 Nicotine dependence, cigarettes, uncomplicated: Secondary | ICD-10-CM | POA: Diagnosis not present

## 2018-07-31 DIAGNOSIS — I2510 Atherosclerotic heart disease of native coronary artery without angina pectoris: Secondary | ICD-10-CM | POA: Diagnosis not present

## 2018-07-31 DIAGNOSIS — M549 Dorsalgia, unspecified: Secondary | ICD-10-CM | POA: Insufficient documentation

## 2018-07-31 DIAGNOSIS — I10 Essential (primary) hypertension: Secondary | ICD-10-CM | POA: Insufficient documentation

## 2018-07-31 HISTORY — DX: Irritable bowel syndrome, unspecified: K58.9

## 2018-07-31 LAB — CBC WITH DIFFERENTIAL/PLATELET
ABS IMMATURE GRANULOCYTES: 0.02 10*3/uL (ref 0.00–0.07)
Basophils Absolute: 0.1 10*3/uL (ref 0.0–0.1)
Basophils Relative: 1 %
Eosinophils Absolute: 0.1 10*3/uL (ref 0.0–0.5)
Eosinophils Relative: 1 %
HCT: 47.4 % — ABNORMAL HIGH (ref 36.0–46.0)
Hemoglobin: 15.8 g/dL — ABNORMAL HIGH (ref 12.0–15.0)
Immature Granulocytes: 0 %
LYMPHS ABS: 1.2 10*3/uL (ref 0.7–4.0)
Lymphocytes Relative: 17 %
MCH: 31.9 pg (ref 26.0–34.0)
MCHC: 33.3 g/dL (ref 30.0–36.0)
MCV: 95.8 fL (ref 80.0–100.0)
Monocytes Absolute: 0.5 10*3/uL (ref 0.1–1.0)
Monocytes Relative: 8 %
NEUTROS ABS: 4.8 10*3/uL (ref 1.7–7.7)
Neutrophils Relative %: 73 %
Platelets: 231 10*3/uL (ref 150–400)
RBC: 4.95 MIL/uL (ref 3.87–5.11)
RDW: 13.2 % (ref 11.5–15.5)
WBC: 6.7 10*3/uL (ref 4.0–10.5)
nRBC: 0 % (ref 0.0–0.2)

## 2018-07-31 LAB — BASIC METABOLIC PANEL
Anion gap: 7 (ref 5–15)
BUN: 10 mg/dL (ref 8–23)
CO2: 24 mmol/L (ref 22–32)
Calcium: 9.3 mg/dL (ref 8.9–10.3)
Chloride: 107 mmol/L (ref 98–111)
Creatinine, Ser: 0.7 mg/dL (ref 0.44–1.00)
GFR calc Af Amer: >60 mL/min (ref >60)
Glucose, Bld: 107 mg/dL — ABNORMAL HIGH (ref 70–99)
Potassium: 4 mmol/L (ref 3.5–5.1)
Sodium: 138 mmol/L (ref 135–145)

## 2018-07-31 LAB — TROPONIN I: Troponin I: <0.03 ng/mL (ref <0.03)

## 2018-07-31 MED ORDER — CYCLOBENZAPRINE HCL 10 MG PO TABS
10.0000 mg | ORAL_TABLET | Freq: Once | ORAL | Status: AC
  Administered 2018-07-31: 10 mg via ORAL
  Filled 2018-07-31: qty 1

## 2018-07-31 MED ORDER — DICLOFENAC SODIUM 50 MG PO TBEC: 50.0000 mg | DELAYED_RELEASE_TABLET | Freq: Two times a day (BID) | ORAL | 0 refills | Status: DC

## 2018-07-31 MED ORDER — CYCLOBENZAPRINE HCL 10 MG PO TABS: 10.0000 mg | ORAL_TABLET | Freq: Two times a day (BID) | ORAL | 0 refills | Status: DC | PRN

## 2018-07-31 MED ORDER — KETOROLAC TROMETHAMINE 60 MG/2ML IM SOLN
60.0000 mg | Freq: Once | INTRAMUSCULAR | Status: AC
  Administered 2018-07-31: 60 mg via INTRAMUSCULAR
  Filled 2018-07-31: qty 2

## 2018-07-31 NOTE — Discharge Instructions (Addendum)
Tests were all good.  Medication for muscle relaxer and anti-inflammatory.  Return if worse.

## 2018-07-31 NOTE — ED Provider Notes (Signed)
[facility] EMERGENCY DEPARTMENT Provider Note   CSN: [id number] Arrival date & time: 07/31/18  1904     History   Chief Complaint Chief Complaint  Patient presents with  . Back Pain    HPI Gabriella Love is a 63 y.o. female.  Pain in right upper back for 24 hours.  No substernal chest pain, dyspnea, diaphoresis, nausea, fever, sweats, chills, rusty sputum.  No prolonged travel or mobilization recently.  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  No recent travel.  Severity is moderate.  Positioning makes pain worse     Past Medical History:  Diagnosis Date  . Anxiety   . Arthritis   . DLBCL (diffuse large B cell lymphoma) ([place]) 07/16/2016  . GERD (gastroesophageal reflux disease)   . Heart disease   . Hyperlipidemia   . Hypertension   . Irritable bowel syndrome (IBS)    with diarrhea  . Myocardial infarction ([place]) 12/19/2009   released from cardiology    Patient Active Problem List   Diagnosis Date Noted  . Lymphoma, large-cell, diffuse ([place]) 07/16/2016  . Cervicitis 08/30/2013  . Vaginitis and vulvovaginitis, unspecified 08/15/2013  . Nausea 11/21/2011  . Abdominal pain 03/26/2011  . Acid reflux 02/26/2011  . Abdominal pain, epigastric 02/26/2011  . BRONCHITIS, CHRONIC 05/14/2010  . NEVUS 04/08/2010  . EUSTACHIAN TUBE DYSFUNCTION 02/13/2010  . HYPERTENSION 01/16/2010  . CORONARY ARTERY DISEASE 01/16/2010  . ECCHYMOSES, SPONTANEOUS 01/16/2010  . HYPERLIPIDEMIA 12/30/2007  . ANXIETY DEPRESSION 12/30/2007  . TOBACCO ABUSE 12/02/2007  . MITRAL VALVE PROLAPSE 12/02/2007  . ALLERGIC RHINITIS 12/02/2007  . IBS 12/02/2007    Past Surgical History:  Procedure Laterality Date  . CARDIAC CATHETERIZATION     cardiac stent  . CHOLECYSTECTOMY    . CORONARY STENT PLACEMENT  2011  . INGUINAL LYMPH NODE BIOPSY Left 07/13/2016   Procedure: EXCISIONAL BIOPSY OF LEFT INGUINAL LYMPH NODE;  Surgeon:  Vickie Epley, MD;  Location: [facility];  Service: General;  Laterality: Left;  . PORT-A-CATH REMOVAL Right 05/20/2018   Procedure: MINOR REMOVAL PORT-A-CATH;  Surgeon: Aviva Signs, MD;  Location: [facility];  Service: General;  Laterality: Right;  . PORTACATH PLACEMENT N/A 07/17/2016   Procedure: INSERTION OF TUNNELED CENTRAL VENOUS CATHETER WITH SUBCUTANEOUS PORT;  Surgeon: Vickie Epley, MD;  Location: [facility];  Service: Vascular;  Laterality: N/A;  . TUBAL LIGATION       OB History   No obstetric history on file.      Home Medications    Prior to Admission medications   Medication Sig Start Date End Date Taking? Authorizing Provider  acetaminophen (TYLENOL) 500 MG tablet Take 500-1,000 mg by mouth every 6 (six) hours as needed for mild pain or moderate pain.   Yes [provider]  ALPRAZolam Duanne Moron) 0.5 MG tablet Take 0.25-0.5 mg by mouth daily as needed for anxiety.  05/08/16  Yes [provider]  aspirin EC 81 MG tablet Take 81 mg by mouth daily.   Yes [provider]  gabapentin (NEURONTIN) 100 MG capsule Take 1 capsule (100 mg total) by mouth 2 (two) times daily. 06/27/18  Yes Lockamy, Randi L, NP-C  cyclobenzaprine (FLEXERIL) 10 MG tablet Take 1 tablet (10 mg total) by mouth 2 (two) times daily as needed for muscle spasms. 07/31/18   Nat Christen, MD  diclofenac (VOLTAREN) 50 MG EC tablet Take 1 tablet (50 mg total) by mouth 2 (  two) times daily. 07/31/18   Nat Christen, MD    Family History Family History  Problem Relation Age of Onset  . Cancer Mother        colon  . Depression Mother   . Heart disease Father   . Depression Sister   . Cancer Sister        leukemia  . Hyperlipidemia Brother   . Hypertension Brother     Social History Social History   Tobacco Use  . Smoking status: Current Every Day Smoker    Packs/day: 0.50    Years: 40.00    Pack years: 20.00    Types: Cigarettes  . Smokeless tobacco: Never Used  Substance Use Topics  .  Alcohol use: No  . Drug use: No     Allergies   Amoxicillin; Choline fenofibrate; Penicillins; and Percocet [oxycodone-acetaminophen]   Review of Systems Review of Systems  All other systems reviewed and are negative.    Physical Exam Updated Vital Signs BP (!) 153/90   Pulse 78   Temp (!) 97.5 F (36.4 C) (Oral)   Resp 13   Ht 5' 1.5" (1.562 m)   Wt 51.7 kg   SpO2 98%   BMI 21.19 kg/m   Physical Exam Vitals signs and nursing note reviewed.  Constitutional:      Appearance: She is well-developed.  HENT:     Head: Normocephalic and atraumatic.  Eyes:     Conjunctiva/sclera: Conjunctivae normal.  Neck:     Musculoskeletal: Neck supple.  Cardiovascular:     Rate and Rhythm: Normal rate and regular rhythm.  Pulmonary:     Effort: Pulmonary effort is normal.     Breath sounds: Normal breath sounds.  Abdominal:     General: Bowel sounds are normal.     Palpations: Abdomen is soft.  Musculoskeletal: Normal range of motion.  Skin:    General: Skin is warm and dry.     Comments: No masses or rash on right upper back  Neurological:     Mental Status: She is alert and oriented to person, place, and time.  Psychiatric:        Behavior: Behavior normal.      ED Treatments / Results  Labs (all labs ordered are listed, but only abnormal results are displayed) Labs Reviewed  CBC WITH DIFFERENTIAL/PLATELET - Abnormal; Notable for the following components:      Result Value   Hemoglobin 15.8 (*)    HCT 47.4 (*)    All other components within normal limits  BASIC METABOLIC PANEL - Abnormal; Notable for the following components:   Glucose, Bld 107 (*)    All other components within normal limits  TROPONIN I    EKG EKG Interpretation  Date/Time:  Sunday July 31 2018 19:09:55 EST Ventricular Rate:  105 PR Interval:  154 QRS Duration: 70 QT Interval:  332 QTC Calculation: 438 R Axis:   89 Text Interpretation:  Sinus tachycardia Right atrial enlargement  Anterior infarct , age undetermined Abnormal ECG Since last tracing T wave abnormality has resolved, rate increased and PRWP now present Confirmed by Noemi Chapel [id number]) on 07/31/2018 7:49:15 PM   Radiology Dg Chest 2 View  Result Date: 07/31/2018 CLINICAL DATA:  RIGHT upper back pain below scapula with nausea since yesterday, history of coronary artery disease post MI, hypertension, smoker EXAM: CHEST - 2 VIEW COMPARISON:  07/17/2016 FINDINGS: Normal heart size, mediastinal contours, and pulmonary vascularity. Emphysematous and minimal bronchitic changes consistent with COPD.  No acute infiltrate, pleural effusion, or pneumothorax. Bones demineralized. Coronary arterial stents incidentally noted. IMPRESSION: COPD changes without acute infiltrate. Electronically Signed   By: Lavonia Dana M.D.   On: 07/31/2018 19:32    Procedures Procedures (including critical care time)  Medications Ordered in ED Medications  ketorolac (TORADOL) injection 60 mg (60 mg Intramuscular Given 07/31/18 2157)  cyclobenzaprine (FLEXERIL) tablet 10 mg (10 mg Oral Given 07/31/18 2313)     Initial Impression / Assessment and Plan / ED Course  I have reviewed the triage vital signs and the nursing notes.  Pertinent labs & imaging results that were available during my care of the patient were reviewed by me and considered in my medical decision making (see chart for details).     Patient presents with pain in her right upper back.  Screening tests including EKG, chest x-ray, labs all acceptable.  Pulse has normalized.  Doubt ACS or PE.  Normal vital signs at discharge.  Final Clinical Impressions(s) / ED Diagnoses   Final diagnoses:  Upper back pain on right side    ED Discharge Orders         Ordered    cyclobenzaprine (FLEXERIL) 10 MG tablet  2 times daily PRN     07/31/18 2322    diclofenac (VOLTAREN) 50 MG EC tablet  2 times daily     07/31/18 2322           Nat Christen, MD 07/31/18 2330

## 2018-07-31 NOTE — ED Triage Notes (Signed)
Pt c/o upper back pain located on right side under scapula with nausea that started yesterday, denies any cough, injury, sob,

## 2018-08-04 DIAGNOSIS — Z029 Encounter for administrative examinations, unspecified: Secondary | ICD-10-CM

## 2018-10-27 ENCOUNTER — Other Ambulatory Visit: Payer: Self-pay

## 2018-10-27 ENCOUNTER — Inpatient Hospital Stay (HOSPITAL_COMMUNITY): Payer: BLUE CROSS/BLUE SHIELD | Attending: Hematology

## 2018-10-27 ENCOUNTER — Ambulatory Visit (HOSPITAL_COMMUNITY)
Admission: RE | Admit: 2018-10-27 | Discharge: 2018-10-27 | Disposition: A | Discharge status: Home / Self Care | Payer: BLUE CROSS/BLUE SHIELD | Source: Ambulatory Visit | Attending: Nurse Practitioner | Admitting: Nurse Practitioner

## 2018-10-27 DIAGNOSIS — C8330 Diffuse large B-cell lymphoma, unspecified site: Secondary | ICD-10-CM | POA: Diagnosis not present

## 2018-10-27 DIAGNOSIS — J432 Centrilobular emphysema: Secondary | ICD-10-CM | POA: Diagnosis not present

## 2018-10-27 DIAGNOSIS — R918 Other nonspecific abnormal finding of lung field: Secondary | ICD-10-CM | POA: Diagnosis not present

## 2018-10-27 DIAGNOSIS — C8335 Diffuse large B-cell lymphoma, lymph nodes of inguinal region and lower limb: Secondary | ICD-10-CM | POA: Insufficient documentation

## 2018-10-27 LAB — CBC WITH DIFFERENTIAL/PLATELET
Abs Immature Granulocytes: 0.02 10*3/uL (ref 0.00–0.07)
Basophils Absolute: 0.1 10*3/uL (ref 0.0–0.1)
Basophils Relative: 1 %
Eosinophils Absolute: 0.1 10*3/uL (ref 0.0–0.5)
Eosinophils Relative: 2 %
HCT: 47.2 % — ABNORMAL HIGH (ref 36.0–46.0)
Hemoglobin: 15.1 g/dL — ABNORMAL HIGH (ref 12.0–15.0)
Immature Granulocytes: 0 %
Lymphocytes Relative: 16 %
Lymphs Abs: 1.1 10*3/uL (ref 0.7–4.0)
MCH: 32 pg (ref 26.0–34.0)
MCHC: 32 g/dL (ref 30.0–36.0)
MCV: 100 fL (ref 80.0–100.0)
Monocytes Absolute: 0.5 10*3/uL (ref 0.1–1.0)
Monocytes Relative: 7 %
Neutro Abs: 5.1 10*3/uL (ref 1.7–7.7)
Neutrophils Relative %: 74 %
Platelets: 289 10*3/uL (ref 150–400)
RBC: 4.72 MIL/uL (ref 3.87–5.11)
RDW: 13.8 % (ref 11.5–15.5)
WBC: 7 10*3/uL (ref 4.0–10.5)
nRBC: 0 % (ref 0.0–0.2)

## 2018-10-27 LAB — LACTATE DEHYDROGENASE: LDH: 156 U/L (ref 98–192)

## 2018-10-27 LAB — COMPREHENSIVE METABOLIC PANEL
ALT: 14 U/L (ref 0–44)
AST: 18 U/L (ref 15–41)
Albumin: 4.3 g/dL (ref 3.5–5.0)
Alkaline Phosphatase: 96 U/L (ref 38–126)
Anion gap: 10 (ref 5–15)
BUN: 7 mg/dL — ABNORMAL LOW (ref 8–23)
CO2: 27 mmol/L (ref 22–32)
Calcium: 9.2 mg/dL (ref 8.9–10.3)
Chloride: 102 mmol/L (ref 98–111)
Creatinine, Ser: 0.8 mg/dL (ref 0.44–1.00)
GFR calc Af Amer: >60 mL/min (ref >60)
GFR calc non Af Amer: >60 mL/min (ref >60)
Glucose, Bld: 117 mg/dL — ABNORMAL HIGH (ref 70–99)
Potassium: 3.5 mmol/L (ref 3.5–5.1)
Sodium: 139 mmol/L (ref 135–145)
Total Bilirubin: 0.6 mg/dL (ref 0.3–1.2)
Total Protein: 7.1 g/dL (ref 6.5–8.1)

## 2018-10-27 MED ORDER — IOHEXOL 300 MG/ML  SOLN
75.0000 mL | Freq: Once | INTRAMUSCULAR | Status: AC | PRN
  Administered 2018-10-27: 75 mL via INTRAVENOUS

## 2018-10-28 ENCOUNTER — Encounter (HOSPITAL_COMMUNITY): Payer: Self-pay | Admitting: Hematology

## 2018-10-28 ENCOUNTER — Other Ambulatory Visit (HOSPITAL_COMMUNITY): Payer: Self-pay | Admitting: *Deleted

## 2018-10-28 ENCOUNTER — Other Ambulatory Visit: Payer: Self-pay

## 2018-10-28 ENCOUNTER — Inpatient Hospital Stay (HOSPITAL_COMMUNITY): Payer: BLUE CROSS/BLUE SHIELD | Attending: Hematology | Admitting: Hematology

## 2018-10-28 ENCOUNTER — Other Ambulatory Visit (HOSPITAL_COMMUNITY): Payer: Self-pay | Admitting: Nurse Practitioner

## 2018-10-28 DIAGNOSIS — Z9221 Personal history of antineoplastic chemotherapy: Secondary | ICD-10-CM | POA: Insufficient documentation

## 2018-10-28 DIAGNOSIS — T451X5S Adverse effect of antineoplastic and immunosuppressive drugs, sequela: Secondary | ICD-10-CM | POA: Insufficient documentation

## 2018-10-28 DIAGNOSIS — I252 Old myocardial infarction: Secondary | ICD-10-CM | POA: Insufficient documentation

## 2018-10-28 DIAGNOSIS — C8335 Diffuse large B-cell lymphoma, lymph nodes of inguinal region and lower limb: Secondary | ICD-10-CM | POA: Diagnosis not present

## 2018-10-28 DIAGNOSIS — I10 Essential (primary) hypertension: Secondary | ICD-10-CM | POA: Diagnosis not present

## 2018-10-28 DIAGNOSIS — J432 Centrilobular emphysema: Secondary | ICD-10-CM | POA: Diagnosis not present

## 2018-10-28 DIAGNOSIS — G620 Drug-induced polyneuropathy: Secondary | ICD-10-CM | POA: Insufficient documentation

## 2018-10-28 DIAGNOSIS — K219 Gastro-esophageal reflux disease without esophagitis: Secondary | ICD-10-CM | POA: Insufficient documentation

## 2018-10-28 DIAGNOSIS — E785 Hyperlipidemia, unspecified: Secondary | ICD-10-CM | POA: Diagnosis not present

## 2018-10-28 DIAGNOSIS — Z780 Asymptomatic menopausal state: Secondary | ICD-10-CM | POA: Diagnosis not present

## 2018-10-28 DIAGNOSIS — F17210 Nicotine dependence, cigarettes, uncomplicated: Secondary | ICD-10-CM | POA: Insufficient documentation

## 2018-10-28 DIAGNOSIS — Z79899 Other long term (current) drug therapy: Secondary | ICD-10-CM | POA: Insufficient documentation

## 2018-10-28 DIAGNOSIS — F419 Anxiety disorder, unspecified: Secondary | ICD-10-CM | POA: Diagnosis not present

## 2018-10-28 DIAGNOSIS — C8330 Diffuse large B-cell lymphoma, unspecified site: Secondary | ICD-10-CM

## 2018-10-28 DIAGNOSIS — Z7982 Long term (current) use of aspirin: Secondary | ICD-10-CM

## 2018-10-28 DIAGNOSIS — G629 Polyneuropathy, unspecified: Secondary | ICD-10-CM

## 2018-10-28 NOTE — Progress Notes (Signed)
Gabriella Love, Gabriella Love   CLINIC:  Medical Oncology/Hematology  PCP:  Celene Squibb, MD [place] [id number] [phone]   REASON FOR VISIT:  Follow-up for  Large B cell lymphoma  CURRENT THERAPY: Surveillance   BRIEF ONCOLOGIC HISTORY:    Lymphoma, large-cell, diffuse ([place])   07/13/2016 Initial Biopsy    L inguinal biopsy with Dr. 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 Grade (if applicable): High grade. Flow cytometry: [id number] reveals a monoclonal B-cell population with expression of CD10. Immunohistochemical stains: CD20, CD3, CD10, bcl-2, bcl-6, CD21, CD23, Ki-67. Touch preps/imprints: Mixture of large and smaller lymphocytes with irregular nuclear contours. Comments: Sections of lymph node reveal effacement of the architecture by a nodular proliferation of neoplastic appearing follicles. In many of the areas the follicles coalesce and form sheets of large cells. The more formed follicles predominately consist of numerous large centroblasts (>15 per hpf), representing a high grade (3B) follicular component. The diffuse areas are also composed of large cells but lack follicular dendritic networks (CD21, CD23). Immunohistochemistry reveals the atypical lymphocytes are positive for CD20, CD10, and bcl-6. They are negative for bcl-2. Ki-67 is elevated (30% overall). CD3 reveals residual surrounding T-cells. Flow cytometry ([id number]) reveals a monoclonal B-cell population with expression of CD10. 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 The case was called to Dr. Rosana Hoes on 07/16/2016.    07/23/2016 Echocardiogram    Left ventricle:  Systolic function was normal. The estimated ejection fraction was in the range of 50% to 55%. Doppler parameters are consistent with abnormal left ventricular relaxation (grade 1 diastolic dysfunction).    07/24/2016 Procedure    Technically successful CT guided right iliac bone core and aspiration biopsy by IR.    07/28/2016 PET scan    1. Hypermetabolic lymphadenopathy in the neck, abdomen and right inguinal area as described above, consistent with known lymphoma. 2. Focal area of hypermetabolism in the appendix could be lymphomatous involvement. Recommend attention on follow-up scans. 3. Age advanced atherosclerotic calcifications involving the abdominal aorta and branch vessels and age advanced three-vessel coronary artery calcifications. 4. No obvious osseous involvement. 5. Emphysematous changes and pulmonary scarring.    07/28/2016 Pathology Results    Bone Marrow, Aspirate,Biopsy, and Clot, right iliac - NORMOCELLULAR BONE MARROW WITH TRILINEAGE HEMATOPOIESIS. PERIPHERAL BLOOD: - OCCASIONAL CIRCULATING ATYPICAL LYMPHOID CELLS.    07/28/2016 Pathology Results    Bone Marrow Flow Cytometry - NO MONOCLONAL B CELL POPULATION OR ABNORMAL T CELL PHENOTYPE IDENTIFIED.    07/30/2016 Pathology Results    Peripheral Blood Flow Cytometry - NO MONOCLONAL B-CELL POPULATION OR ABNORMAL T-CELL PHENOTYPE IDENTIFIED.    07/30/2016 -  Chemotherapy    The patient had DOXOrubicin (ADRIAMYCIN) chemo injection 78 mg, 50 mg/m2 = 78 mg, Intravenous,  Once, 2 of 6 cycles  palonosetron (ALOXI) injection 0.25 mg, 0.25 mg, Intravenous,  Once, 2 of 6 cycles  pegfilgrastim (NEULASTA ONPRO KIT) injection 6 mg, 6 mg, Subcutaneous, Once, 2 of 6 cycles  vinCRIStine (ONCOVIN) 2 mg in sodium chloride 0.9 % 50 mL chemo infusion, 2 mg, Intravenous,  Once, 2 of 6 cycles  riTUXimab (RITUXAN) 600 mg in sodium chloride 0.9 % 250 mL (1.9355 mg/mL)  chemo infusion, 375 mg/m2 = 600 mg, Intravenous,  Once, 2 of 6  cycles  cyclophosphamide (CYTOXAN) 1,180 mg in sodium chloride 0.9 % 250 mL chemo infusion, 750 mg/m2 = 1,180 mg, Intravenous,  Once, 2 of 6 cycles  for chemotherapy treatment.      08/03/2016 Pathology Results    Cytogenetic lab results [facility]). Cytogenetic Analysis: Normal.     09/23/2016 PET scan    1. Significantly improved appearance, with resolution of prior hypermetabolic activity in all of the previously involved lymph nodes. All lymph nodes are currently within normal size limits. 2. Other imaging findings of potential clinical significance: Chronic right sphenoid sinusitis. Right mastoid effusion. Coronary, aortic arch, and branch vessel atherosclerotic vascular disease. Severe emphysema. Aortoiliac atherosclerotic vascular disease.    11/12/2016 -  Chemotherapy    Completed cycle 6 of RCHOP     12/11/2016 PET scan    1. No evidence of residual hypermetabolic lymphoma or adenopathy. 2. Age advanced coronary artery atherosclerosis. Recommend assessment of coronary risk factors and consideration of medical therapy. 3. Centrilobular emphysema.    06/18/2017 PET scan    IMPRESSION: 1. No new or recurrent hypermetabolic adenopathy in the neck, chest, abdomen, or pelvis. 2. There is potentially some mild wall thickening along a 6 cm segment of the distal ileum. Scattered accentuated metabolic activity in the bowel, likely physiologic. There is some fat deposition in the proximal colon wall, which is typically considered incidental although there is a weak association with inflammatory bowel disease. 3. Aortic Atherosclerosis (ICD10-I70.0) and Emphysema (ICD10-J43.9). Coronary atherosclerosis.      CANCER STAGING: Cancer Staging Lymphoma, large-cell, diffuse ([place]) Staging form: Hodgkin and Non-Hodgkin Lymphoma, AJCC 8th Edition - Clinical stage from 07/31/2016: Stage III (Diffuse large B-cell lymphoma) - Signed by Baird Cancer, PA-C on 07/31/2016    INTERVAL  HISTORY:  Gabriella Love 63 y.o. female returns for routine follow-up. She is here today alone. She states that she has been doing well since her last visit. Denies any nausea, vomiting, or diarrhea. Denies any new pains. Had not noticed any recent bleeding such as epistaxis, hematuria or hematochezia. Denies recent chest pain on exertion, shortness of breath on minimal exertion, pre-syncopal episodes, or palpitations. Denies any numbness or tingling in hands or feet. Denies any recent fevers, infections, or recent hospitalizations. Patient reports appetite at 100% and energy level at 75%.    REVIEW OF SYSTEMS:  Review of Systems  All other systems reviewed and are negative.    PAST MEDICAL/SURGICAL HISTORY:  Past Medical History:  Diagnosis Date  . Anxiety   . Arthritis   . DLBCL (diffuse large B cell lymphoma) ([place]) 07/16/2016  . GERD (gastroesophageal reflux disease)   . Heart disease   . Hyperlipidemia   . Hypertension   . Irritable bowel syndrome (IBS)    with diarrhea  . Myocardial infarction ([place]) 12/19/2009   released from cardiology   Past Surgical History:  Procedure Laterality Date  . CARDIAC CATHETERIZATION     cardiac stent  . CHOLECYSTECTOMY    . CORONARY STENT PLACEMENT  2011  . INGUINAL LYMPH NODE BIOPSY Left 07/13/2016   Procedure: EXCISIONAL BIOPSY OF LEFT INGUINAL LYMPH NODE;  Surgeon: Vickie Epley, MD;  Location: [facility];  Service: General;  Laterality: Left;  . PORT-A-CATH REMOVAL Right 05/20/2018   Procedure: MINOR REMOVAL PORT-A-CATH;  Surgeon: Aviva Signs, MD;  Location: [facility];  Service: General;  Laterality: Right;  . PORTACATH PLACEMENT N/A 07/17/2016   Procedure:  INSERTION OF TUNNELED CENTRAL VENOUS CATHETER WITH SUBCUTANEOUS PORT;  Surgeon: Vickie Epley, MD;  Location: [facility];  Service: Vascular;  Laterality: N/A;  . TUBAL LIGATION       SOCIAL HISTORY:  Social History   Socioeconomic History  . Marital status: Single    Spouse name:  Not on file  . Number of children: Not on file  . Years of education: Not on file  . Highest education level: Not on file  Occupational History  . Not on file  Social Needs  . Financial resource strain: Not on file  . Food insecurity:    Worry: Not on file    Inability: Not on file  . Transportation needs:    Medical: Not on file    Non-medical: Not on file  Tobacco Use  . Smoking status: Current Every Day Smoker    Packs/day: 0.50    Years: 40.00    Pack years: 20.00    Types: Cigarettes  . Smokeless tobacco: Never Used  Substance and Sexual Activity  . Alcohol use: No  . Drug use: No  . Sexual activity: Yes    Birth control/protection: Post-menopausal  Lifestyle  . Physical activity:    Days per week: Not on file    Minutes per session: Not on file  . Stress: Not on file  Relationships  . Social connections:    Talks on phone: Not on file    Gets together: Not on file    Attends religious service: Not on file    Active member of club or organization: Not on file    Attends meetings of clubs or organizations: Not on file    Relationship status: Not on file  . Intimate partner violence:    Fear of current or ex partner: Not on file    Emotionally abused: Not on file    Physically abused: Not on file    Forced sexual activity: Not on file  Other Topics Concern  . Not on file  Social History Narrative  . Not on file    FAMILY HISTORY:  Family History  Problem Relation Age of Onset  . Cancer Mother        colon  . Depression Mother   . Heart disease Father   . Depression Sister   . Cancer Sister        leukemia  . Hyperlipidemia Brother   . Hypertension Brother     CURRENT MEDICATIONS:  Outpatient Encounter Medications as of 10/28/2018  Medication Sig  . acetaminophen (TYLENOL) 500 MG tablet Take 500-1,000 mg by mouth every 6 (six) hours as needed for mild pain or moderate pain.  Marland Kitchen ALPRAZolam (XANAX) 0.5 MG tablet Take 0.25-0.5 mg by mouth daily as  needed for anxiety.   Marland Kitchen aspirin EC 81 MG tablet Take 81 mg by mouth daily.  Marland Kitchen buPROPion (WELLBUTRIN XL) 150 MG 24 hr tablet Take 150 mg by mouth daily.  . fluticasone (FLONASE) 50 MCG/ACT nasal spray Place 2 sprays into both nostrils daily.  Marland Kitchen gabapentin (NEURONTIN) 100 MG capsule Take 1 capsule (100 mg total) by mouth 2 (two) times daily.  . pantoprazole (PROTONIX) 40 MG tablet Take 40 mg by mouth daily. Pt takes at supper daily  . simvastatin (ZOCOR) 40 MG tablet Take 40 mg by mouth daily.  . cyclobenzaprine (FLEXERIL) 10 MG tablet Take 1 tablet (10 mg total) by mouth 2 (two) times daily as needed for muscle spasms. (Patient not taking: Reported on  10/28/2018)  . diclofenac (VOLTAREN) 50 MG EC tablet Take 1 tablet (50 mg total) by mouth 2 (two) times daily. (Patient not taking: Reported on 10/28/2018)   No facility-administered encounter medications on file as of 10/28/2018.     ALLERGIES:  Allergies  Allergen Reactions  . Amoxicillin Nausea And Vomiting    Yeast Infections  Has patient had a PCN reaction causing immediate rash, facial/tongue/throat swelling, SOB or lightheadedness with hypotension: No Has patient had a PCN reaction causing severe rash involving mucus membranes or skin necrosis: No Has patient had a PCN reaction that required hospitalization No Has patient had a PCN reaction occurring within the last 10 years: No If all of the above answers are "NO", then may proceed with Cephalosporin use.   Marland Kitchen Choline Fenofibrate     REACTION: Abd pain and elevated Lipase  . Penicillins     Yeast infections, thrush  Has patient had a PCN reaction causing immediate rash, facial/tongue/throat swelling, SOB or lightheadedness with hypotension: No Has patient had a PCN reaction causing severe rash involving mucus membranes or skin necrosis: No Has patient had a PCN reaction that required hospitalization No Has patient had a PCN reaction occurring within the last 10 years: No If all of the  above answers are "NO", then may proceed with Cephalosporin use.   Marland Kitchen Percocet [Oxycodone-Acetaminophen] Nausea And Vomiting     PHYSICAL EXAM:  ECOG Performance status: 1  Vitals:   10/28/18 1114  BP: 138/74  Pulse: 97  Resp: 18  Temp: 98.2 F (36.8 C)  SpO2: 97%   Filed Weights   10/28/18 1114  Weight: 110 lb 4.8 oz (50 kg)    Physical Exam Constitutional:      Appearance: Normal appearance.  Cardiovascular:     Rate and Rhythm: Normal rate and regular rhythm.     Heart sounds: Normal heart sounds.  Pulmonary:     Effort: Pulmonary effort is normal.     Breath sounds: Normal breath sounds.  Abdominal:     General: There is no distension.     Palpations: Abdomen is soft.     Tenderness: There is no abdominal tenderness.  Musculoskeletal:        General: No swelling.  Skin:    General: Skin is warm.  Neurological:     General: No focal deficit present.     Mental Status: She is alert and oriented to person, place, and time.  Psychiatric:        Mood and Affect: Mood normal.        Behavior: Behavior normal.      LABORATORY DATA:  I have reviewed the labs as listed.  CBC    Component Value Date/Time   WBC 7.0 10/27/2018 1200   RBC 4.72 10/27/2018 1200   HGB 15.1 (H) 10/27/2018 1200   HCT 47.2 (H) 10/27/2018 1200   PLT 289 10/27/2018 1200   MCV 100.0 10/27/2018 1200   MCH 32.0 10/27/2018 1200   MCHC 32.0 10/27/2018 1200   RDW 13.8 10/27/2018 1200   LYMPHSABS 1.1 10/27/2018 1200   MONOABS 0.5 10/27/2018 1200   EOSABS 0.1 10/27/2018 1200   BASOSABS 0.1 10/27/2018 1200   CMP Latest Ref Rng & Units 10/27/2018 07/31/2018 06/17/2018  Glucose 70 - 99 mg/dL 117(H) 107(H) 117(H)  BUN 8 - 23 mg/dL 7(L) 10 7(L)  Creatinine 0.44 - 1.00 mg/dL 0.80 0.70 0.83  Sodium 135 - 145 mmol/L 139 138 139  Potassium 3.5 - 5.1 mmol/L  3.5 4.0 4.6  Chloride 98 - 111 mmol/L 102 107 103  CO2 22 - 32 mmol/L _0 Calcium 8.9 - 10.3 mg/dL 9.2 9.3 9.8  Total Protein 6.5 -  8.1 g/dL 7.1 - 7.2  Total Bilirubin 0.3 - 1.2 mg/dL 0.6 - 0.5  Alkaline Phos 38 - 126 U/L 96 - 101  AST 15 - 41 U/L 18 - 17  ALT 0 - 44 U/L 14 - 12       DIAGNOSTIC IMAGING:  I have independently reviewed the scans and discussed with the patient.   I have reviewed Venita Lick LPN's note and agree with the documentation.  I personally performed a face-to-face visit, made revisions and my assessment and plan is as follows.    ASSESSMENT & PLAN:   Lymphoma, large-cell, diffuse ([facility]) 1.  Stage IIIb large B-cell lymphoma: -Presentation with weight loss, night sweats, adenopathy in the neck, abdomen and right inguinal area -6 cycles of R-CHOP from 07/30/2016 through 11/12/2016 - Denies fevers, night sweats or weight loss.  Denies any recurrent infections or hospitalizations. -CT CAP on 06/21/2018 did not show any evidence of adenopathy in the chest, abdomen and pelvis.  Innumerable new groundglass nodules in both lungs. -She is current active smoker. - I reviewed results of the CT chest with contrast dated 10/27/2018 which did not show any thoracic adenopathy to suggest recurrent or residual lymphoma.  Resolution of the majority of previously described groundglass nodules.  A left upper lobe 6 mm groundglass nodule persists. - Follow-up CT scan in 2 years was recommended. - Today's physical examination did not reveal any palpable adenopathy.  Blood work and LDH were normal. -I will see her back in 6 months for follow-up with repeat blood work.  I will do scans only if clinical condition dictates.  2.  Left colon thickening: -Her last colonoscopy was in 1999. - Her last CT scan showed thickening of the sigmoid colon area.  The current CT scan on 06/21/2018 showed improved appearance of the colon since 12/21/2017.  She denies any bleeding per rectum or melena.  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  3.  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 - We  have renewed her gabapentin.        Orders placed this encounter:  No orders of the defined types were placed in this encounter.     Derek Jack, MD [place] [phone]

## 2018-10-28 NOTE — Assessment & Plan Note (Signed)
1.  Stage IIIb large B-cell lymphoma: -Presentation with weight loss, night sweats, adenopathy in the neck, abdomen and right inguinal area -6 cycles of R-CHOP from 07/30/2016 through 11/12/2016 - Denies fevers, night sweats or weight loss.  Denies any recurrent infections or hospitalizations. -CT CAP on 06/21/2018 did not show any evidence of adenopathy in the chest, abdomen and pelvis.  Innumerable new groundglass nodules in both lungs. -She is current active smoker. - I reviewed results of the CT chest with contrast dated 10/27/2018 which did not show any thoracic adenopathy to suggest recurrent or residual lymphoma.  Resolution of the majority of previously described groundglass nodules.  A left upper lobe 6 mm groundglass nodule persists. - Follow-up CT scan in 2 years was recommended. - Today's physical examination did not reveal any palpable adenopathy.  Blood work and LDH were normal. -I will see her back in 6 months for follow-up with repeat blood work.  I will do scans only if clinical condition dictates.  2.  Left colon thickening: -Her last colonoscopy was in 1999. - Her last CT scan showed thickening of the sigmoid colon area.  The current CT scan on 06/21/2018 showed improved appearance of the colon since 12/21/2017.  She denies any bleeding per rectum or melena.  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  3.  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 - We have renewed her gabapentin.

## 2018-10-28 NOTE — Patient Instructions (Addendum)
[facility] Discharge Instructions  You were seen today by Dr. Katragadda. He went over your recent scan results. He will see you back in 6 months for labs and follow up.   Thank you for choosing [facility] at [facility] to provide your oncology and hematology care.  To afford each patient quality time with our provider, please arrive at least 15 minutes before your scheduled appointment time.   If you have a lab appointment with the Cancer Center please come in thru the  Main Entrance and check in at the main information desk  You need to re-schedule your appointment should you arrive 10 or more minutes late.  We strive to give you quality time with our providers, and arriving late affects you and other patients whose appointments are after yours.  Also, if you no show three or more times for appointments you may be dismissed from the clinic at the providers discretion.     Again, thank you for choosing [facility].  Our hope is that these requests will decrease the amount of time that you wait before being seen by our physicians.       _____________________________________________________________  Should you have questions after your visit to [facility], please contact our office at [phone] between the hours of 8:00 a.m. and 4:30 p.m.  Voicemails left after 4:00 p.m. will not be returned until the following business day.  For prescription refill requests, have your pharmacy contact our office and allow 72 hours.    Cancer Center Support Programs:   > Cancer Support Group  2nd Tuesday of the month 1pm-2pm, Journey Room    

## 2019-02-01 DIAGNOSIS — M25552 Pain in left hip: Secondary | ICD-10-CM | POA: Diagnosis not present

## 2019-02-13 ENCOUNTER — Encounter: Payer: Self-pay | Admitting: Orthopedic Surgery

## 2019-02-13 ENCOUNTER — Ambulatory Visit (INDEPENDENT_AMBULATORY_CARE_PROVIDER_SITE_OTHER): Payer: BC Managed Care – PPO

## 2019-02-13 ENCOUNTER — Other Ambulatory Visit: Payer: Self-pay

## 2019-02-13 ENCOUNTER — Ambulatory Visit (INDEPENDENT_AMBULATORY_CARE_PROVIDER_SITE_OTHER): Payer: BC Managed Care – PPO | Admitting: Orthopedic Surgery

## 2019-02-13 VITALS — BP 111/71 | HR 101 | Temp 97.4°F | Ht 61.5 in | Wt 110.0 lb

## 2019-02-13 DIAGNOSIS — M25552 Pain in left hip: Secondary | ICD-10-CM

## 2019-02-13 MED ORDER — MELOXICAM 15 MG PO TABS: 15.0000 mg | ORAL_TABLET | Freq: Every day | ORAL | 1 refills | Status: DC

## 2019-02-13 NOTE — Progress Notes (Signed)
Gabriella Love  02/13/2019  HISTORY SECTION :  Chief Complaint  Patient presents with  . Hip Pain    Left hip in the groin area (pt states had lymph node removed from that area) going around to hip for 1 month   63 years old had a history of large B cell non-Hodgkin's lymphoma developed left groin pain on June 20 somewhat related to moving a water bed on June 17.  She was treated with meloxicam prednisone robaxin noticed some mild improvement but persisting groin and left thigh pain which is dull aching somewhat interfering with her ability to walk  Associated with a rash that has developed in her groin area which she is concerned because of the lymphoma and was previously told that rashes could be a sign of recurrent disease    Review of Systems  Musculoskeletal: Positive for joint pain.  All other systems reviewed and are negative.    Past Medical History:  Diagnosis Date  . Anxiety   . Arthritis   . DLBCL (diffuse large B cell lymphoma) ([place]) 07/16/2016  . GERD (gastroesophageal reflux disease)   . Heart disease   . Hyperlipidemia   . Hypertension   . Irritable bowel syndrome (IBS)    with diarrhea  . Myocardial infarction ([place]) 12/19/2009   released from cardiology    Past Surgical History:  Procedure Laterality Date  . CARDIAC CATHETERIZATION     cardiac stent  . CHOLECYSTECTOMY    . CORONARY STENT PLACEMENT  2011  . INGUINAL LYMPH NODE BIOPSY Left 07/13/2016   Procedure: EXCISIONAL BIOPSY OF LEFT INGUINAL LYMPH NODE;  Surgeon: Vickie Epley, MD;  Location: [facility];  Service: General;  Laterality: Left;  . PORT-A-CATH REMOVAL Right 05/20/2018   Procedure: MINOR REMOVAL PORT-A-CATH;  Surgeon: Aviva Signs, MD;  Location: [facility];  Service: General;  Laterality: Right;  . PORTACATH PLACEMENT N/A 07/17/2016   Procedure: INSERTION OF TUNNELED CENTRAL VENOUS CATHETER WITH SUBCUTANEOUS PORT;  Surgeon: Vickie Epley, MD;  Location: [facility];  Service: Vascular;   Laterality: N/A;  . TUBAL LIGATION       Allergies  Allergen Reactions  . Amoxicillin Nausea And Vomiting    Yeast Infections  Has patient had a PCN reaction causing immediate rash, facial/tongue/throat swelling, SOB or lightheadedness with hypotension: No Has patient had a PCN reaction causing severe rash involving mucus membranes or skin necrosis: No Has patient had a PCN reaction that required hospitalization No Has patient had a PCN reaction occurring within the last 10 years: No If all of the above answers are "NO", then may proceed with Cephalosporin use.   Marland Kitchen Choline Fenofibrate     REACTION: Abd pain and elevated Lipase  . Penicillins     Yeast infections, thrush  Has patient had a PCN reaction causing immediate rash, facial/tongue/throat swelling, SOB or lightheadedness with hypotension: No Has patient had a PCN reaction causing severe rash involving mucus membranes or skin necrosis: No Has patient had a PCN reaction that required hospitalization No Has patient had a PCN reaction occurring within the last 10 years: No If all of the above answers are "NO", then may proceed with Cephalosporin use.   Marland Kitchen Percocet [Oxycodone-Acetaminophen] Nausea And Vomiting     Current Outpatient Medications:  .  acetaminophen (TYLENOL) 500 MG tablet, Take 500-1,000 mg by mouth every 6 (six) hours as needed for mild pain or moderate pain., Disp: , Rfl:  .  ALPRAZolam (XANAX) 0.5 MG tablet, Take  0.25-0.5 mg by mouth daily as needed for anxiety. , Disp: , Rfl:  .  buPROPion (WELLBUTRIN XL) 150 MG 24 hr tablet, Take 150 mg by mouth daily., Disp: , Rfl:  .  gabapentin (NEURONTIN) 100 MG capsule, TAKE 1 CAPSULE BY MOUTH 2 TIMES A DAY., Disp: 60 capsule, Rfl: 0 .  pantoprazole (PROTONIX) 40 MG tablet, Take 40 mg by mouth daily. Pt takes at supper daily, Disp: , Rfl:  .  simvastatin (ZOCOR) 40 MG tablet, Take 40 mg by mouth daily., Disp: , Rfl:  .  meloxicam (MOBIC) 15 MG tablet, Take 1 tablet (15 mg  total) by mouth daily., Disp: 21 tablet, Rfl: 1 .  methocarbamol (ROBAXIN) 500 MG tablet, , Disp: , Rfl:    PHYSICAL EXAM SECTION: 1) BP 111/71   Pulse (!) 101   Temp (!) 97.4 F (36.3 C)   Ht 5' 1.5" (1.562 m)   Wt 110 lb (49.9 kg)   BMI 20.45 kg/m   Body mass index is 20.45 kg/m. General appearance: Well-developed well-nourished no gross deformities  2) Cardiovascular normal pulse and perfusion in all 4 extremities normal color without edema  3) Neurologically deep tendon reflexes are equal and normal, no sensation loss or deficits no pathologic reflexes  4) Psychological: Awake alert and oriented x3 mood and affect normal  5) Skin no lacerations or ulcerations no nodularity no palpable masses, no erythema or nodularity  6) Musculoskeletal:   Lumbar spine is nontender buttocks are nontender  Right hip no tenderness normal range of motion stable normal muscle tone skin is noted to have a rash by the patient  Left hip tender over the anterior thigh and groin with painful abduction normal flexion mild pain with internal rotation mild tenderness over the anterior thigh no instability muscle tone is normal skin again has a rash according to the patient  There was no lymphadenopathy in the groin   MEDICAL DECISION SECTION:  Encounter Diagnosis  Name Primary?  . Pain in left hip Yes   Meds ordered this encounter  Medications  . meloxicam (MOBIC) 15 MG tablet    Sig: Take 1 tablet (15 mg total) by mouth daily.    Dispense:  21 tablet    Refill:  1      Imaging Hip x-ray see report normal xrays   Plan:  (Rx., Inj., surg., Frx, MRI/CT, XR:2)  nsaids  Fu 3 weeks   3:15 PM

## 2019-02-14 ENCOUNTER — Encounter (HOSPITAL_COMMUNITY): Payer: Self-pay | Admitting: *Deleted

## 2019-02-14 NOTE — Progress Notes (Signed)
Patient called with concerns of a rash around her groin area that started as itching and it has gone from her groin around towards her buttock.  She has seen her primary care, and orthopedic specialist and they have advised her to follow up here to r/u lymphoma recurrence.  I have spoke with Harriet Pho, NP and she wants to see the patient.  Patient is aware of appointment.

## 2019-02-15 ENCOUNTER — Inpatient Hospital Stay (HOSPITAL_COMMUNITY): Payer: BC Managed Care – PPO | Attending: Hematology | Admitting: Hematology

## 2019-02-15 ENCOUNTER — Other Ambulatory Visit: Payer: Self-pay

## 2019-02-15 ENCOUNTER — Encounter (HOSPITAL_COMMUNITY): Payer: Self-pay | Admitting: Hematology

## 2019-02-15 VITALS — BP 118/61 | HR 99 | Temp 98.6°F | Resp 18 | Wt 111.7 lb

## 2019-02-15 DIAGNOSIS — Z79899 Other long term (current) drug therapy: Secondary | ICD-10-CM | POA: Diagnosis not present

## 2019-02-15 DIAGNOSIS — G629 Polyneuropathy, unspecified: Secondary | ICD-10-CM

## 2019-02-15 DIAGNOSIS — F419 Anxiety disorder, unspecified: Secondary | ICD-10-CM | POA: Insufficient documentation

## 2019-02-15 DIAGNOSIS — I252 Old myocardial infarction: Secondary | ICD-10-CM | POA: Insufficient documentation

## 2019-02-15 DIAGNOSIS — Z800 Family history of malignant neoplasm of digestive organs: Secondary | ICD-10-CM | POA: Insufficient documentation

## 2019-02-15 DIAGNOSIS — F17210 Nicotine dependence, cigarettes, uncomplicated: Secondary | ICD-10-CM | POA: Diagnosis not present

## 2019-02-15 DIAGNOSIS — C8330 Diffuse large B-cell lymphoma, unspecified site: Secondary | ICD-10-CM | POA: Diagnosis not present

## 2019-02-15 DIAGNOSIS — J439 Emphysema, unspecified: Secondary | ICD-10-CM | POA: Insufficient documentation

## 2019-02-15 DIAGNOSIS — I10 Essential (primary) hypertension: Secondary | ICD-10-CM | POA: Insufficient documentation

## 2019-02-15 DIAGNOSIS — E785 Hyperlipidemia, unspecified: Secondary | ICD-10-CM | POA: Insufficient documentation

## 2019-02-15 DIAGNOSIS — Z9221 Personal history of antineoplastic chemotherapy: Secondary | ICD-10-CM

## 2019-02-15 NOTE — Assessment & Plan Note (Signed)
1.  Stage IIIb large B-cell lymphoma: -Presentation with weight loss, night sweats, adenopathy in the neck, abdomen and right inguinal area -6 cycles of R-CHOP from 07/30/2016 through 11/12/2016 - Denies fevers, night sweats or weight loss.  Denies any recurrent infections or hospitalizations. -CT CAP on 06/21/2018 did not show any evidence of adenopathy in the chest, abdomen and pelvis.  Innumerable new groundglass nodules in both lungs. -She is current active smoker. - I reviewed results of the CT chest with contrast dated 10/27/2018 which did not show any thoracic adenopathy to suggest recurrent or residual lymphoma.  Resolution of the majority of previously described groundglass nodules.  A left upper lobe 6 mm groundglass nodule persists. - Follow-up CT scan in 2 years was recommended. - Today's physical examination did not reveal any palpable adenopathy.    2. Left Femur pain: -Developed left femur pain in June secondary to injury. On today's exam, patient experience pain with gentle palpation of the left femur.  Plain films performed on February 10, 2019 did not reveal any acute findings.  I have recommended an MRI of left femur.  However, patient states she is very claustrophobic and would not be able to undergo MRI.  Therefore, I have recommend proceed with CT of left femur to further rule out any underlying etiology.   - She will return to clinic in 2 weeks to discuss CT results.  3.  Left colon thickening: -Her last colonoscopy was in 1999. - Her last CT scan showed thickening of the sigmoid colon area.  The current CT scan on 06/21/2018 showed improved appearance of the colon since 12/21/2017.  She denies any bleeding per rectum or melena.  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  4.  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 - She will continue gabapentin daily.

## 2019-02-15 NOTE — Progress Notes (Signed)
[place], [place] [address]   CLINIC:  Medical Oncology/Hematology  PCP:  Celene Squibb, MD [place] [address] [phone]   REASON FOR VISIT:  Follow-up for Lymphoma  CURRENT THERAPY: Clinical surveillance  BRIEF ONCOLOGIC HISTORY:  Oncology History  Lymphoma, large-cell, diffuse ([place])  07/13/2016 Initial Biopsy   L inguinal biopsy with Dr. 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 Grade (if applicable): High grade. Flow cytometry: [id number] reveals a monoclonal B-cell population with expression of CD10. Immunohistochemical stains: CD20, CD3, CD10, bcl-2, bcl-6, CD21, CD23, Ki-67. Touch preps/imprints: Mixture of large and smaller lymphocytes with irregular nuclear contours. Comments: Sections of lymph node reveal effacement of the architecture by a nodular proliferation of neoplastic appearing follicles. In many of the areas the follicles coalesce and form sheets of large cells. The more formed follicles predominately consist of numerous large centroblasts (>15 per hpf), representing a high grade (3B) follicular component. The diffuse areas are also composed of large cells but lack follicular dendritic networks (CD21, CD23). Immunohistochemistry reveals the atypical lymphocytes are positive for CD20, CD10, and bcl-6. They are negative for bcl-2. Ki-67 is elevated (30% overall). CD3 reveals residual surrounding T-cells. Flow cytometry ([id number]) reveals a monoclonal B-cell population with expression of CD10. 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 The case was called to Dr. Rosana Hoes on 07/16/2016.   07/23/2016 Echocardiogram   Left  ventricle: Systolic function was normal. The estimated ejection fraction was in the range of 50% to 55%. Doppler parameters are consistent with abnormal left ventricular relaxation (grade 1 diastolic dysfunction).   07/24/2016 Procedure   Technically successful CT guided right iliac bone core and aspiration biopsy by IR.   07/28/2016 PET scan   1. Hypermetabolic lymphadenopathy in the neck, abdomen and right inguinal area as described above, consistent with known lymphoma. 2. Focal area of hypermetabolism in the appendix could be lymphomatous involvement. Recommend attention on follow-up scans. 3. Age advanced atherosclerotic calcifications involving the abdominal aorta and branch vessels and age advanced three-vessel coronary artery calcifications. 4. No obvious osseous involvement. 5. Emphysematous changes and pulmonary scarring.   07/28/2016 Pathology Results   Bone Marrow, Aspirate,Biopsy, and Clot, right iliac - NORMOCELLULAR BONE MARROW WITH TRILINEAGE HEMATOPOIESIS. PERIPHERAL BLOOD: - OCCASIONAL CIRCULATING ATYPICAL LYMPHOID CELLS.   07/28/2016 Pathology Results   Bone Marrow Flow Cytometry - NO MONOCLONAL B CELL POPULATION OR ABNORMAL T CELL PHENOTYPE IDENTIFIED.   07/30/2016 Pathology Results   Peripheral Blood Flow Cytometry - NO MONOCLONAL B-CELL POPULATION OR ABNORMAL T-CELL PHENOTYPE IDENTIFIED.   07/30/2016 -  Chemotherapy   The patient had DOXOrubicin (ADRIAMYCIN) chemo injection 78 mg, 50 mg/m2 = 78 mg, Intravenous,  Once, 2 of 6 cycles  palonosetron (ALOXI) injection 0.25 mg, 0.25 mg, Intravenous,  Once, 2 of 6 cycles  pegfilgrastim (NEULASTA ONPRO KIT) injection 6 mg, 6 mg, Subcutaneous, Once, 2 of 6 cycles  vinCRIStine (ONCOVIN) 2 mg in sodium chloride 0.9 % 50 mL chemo infusion, 2 mg, Intravenous,  Once, 2 of 6 cycles  riTUXimab (RITUXAN) 600 mg in sodium chloride 0.9 % 250 mL (1.9355 mg/mL) chemo infusion, 375 mg/m2 = 600 mg, Intravenous,  Once, 2 of 6  cycles  cyclophosphamide (CYTOXAN) 1,180 mg in  sodium chloride 0.9 % 250 mL chemo infusion, 750 mg/m2 = 1,180 mg, Intravenous,  Once, 2 of 6 cycles  for chemotherapy treatment.     08/03/2016 Pathology Results   Cytogenetic lab results [facility]). Cytogenetic Analysis: Normal.    09/23/2016 PET scan   1. Significantly improved appearance, with resolution of prior hypermetabolic activity in all of the previously involved lymph nodes. All lymph nodes are currently within normal size limits. 2. Other imaging findings of potential clinical significance: Chronic right sphenoid sinusitis. Right mastoid effusion. Coronary, aortic arch, and branch vessel atherosclerotic vascular disease. Severe emphysema. Aortoiliac atherosclerotic vascular disease.   11/12/2016 -  Chemotherapy   Completed cycle 6 of RCHOP    12/11/2016 PET scan   1. No evidence of residual hypermetabolic lymphoma or adenopathy. 2. Age advanced coronary artery atherosclerosis. Recommend assessment of coronary risk factors and consideration of medical therapy. 3. Centrilobular emphysema.   06/18/2017 PET scan   IMPRESSION: 1. No new or recurrent hypermetabolic adenopathy in the neck, chest, abdomen, or pelvis. 2. There is potentially some mild wall thickening along a 6 cm segment of the distal ileum. Scattered accentuated metabolic activity in the bowel, likely physiologic. There is some fat deposition in the proximal colon wall, which is typically considered incidental although there is a weak association with inflammatory bowel disease. 3. Aortic Atherosclerosis (ICD10-I70.0) and Emphysema (ICD10-J43.9). Coronary atherosclerosis.      CANCER STAGING: Cancer Staging Lymphoma, large-cell, diffuse ([place]) Staging form: Hodgkin and Non-Hodgkin Lymphoma, AJCC 8th Edition - Clinical stage from 07/31/2016: Stage III (Diffuse large B-cell lymphoma) - Signed by Baird Cancer, PA-C on 07/31/2016    INTERVAL HISTORY:  Ms.  Rouse 63 y.o. female presents today for follow-up.  She reports overall doing well.  She states she injured her left leg when trying to move a water back in June.  She states since then she has experiencing left upper leg tenderness and pain.  She has followed up with her primary care who ordered plain films which were negative for any acute findings.  She denies any difficulty walking.  She states she notices the pain with touch or certain movements.  She is concerned with her history of lymphoma.  She denies any fevers, chills, night sweats.  No weight loss.  No lymphadenopathy.   REVIEW OF SYSTEMS:  Review of Systems  Constitutional: Negative.   HENT:  Negative.   Eyes: Negative.   Respiratory: Negative.   Cardiovascular: Negative.   Gastrointestinal: Negative.   Endocrine: Negative.   Genitourinary: Negative.    Musculoskeletal: Positive for arthralgias. Negative for gait problem.       Left Leg pain and tenderness   Skin: Negative.   Neurological: Negative.  Negative for dizziness and gait problem.  Hematological: Negative.   Psychiatric/Behavioral: Negative.      PAST MEDICAL/SURGICAL HISTORY:  Past Medical History:  Diagnosis Date  . Anxiety   . Arthritis   . DLBCL (diffuse large B cell lymphoma) ([place]) 07/16/2016  . GERD (gastroesophageal reflux disease)   . Heart disease   . Hyperlipidemia   . Hypertension   . Irritable bowel syndrome (IBS)    with diarrhea  . Myocardial infarction ([place]) 12/19/2009   released from cardiology   Past Surgical History:  Procedure Laterality Date  . CARDIAC CATHETERIZATION     cardiac stent  . CHOLECYSTECTOMY    . CORONARY STENT PLACEMENT  2011  . INGUINAL LYMPH NODE BIOPSY Left 07/13/2016   Procedure: EXCISIONAL BIOPSY OF LEFT  INGUINAL LYMPH NODE;  Surgeon: Vickie Epley, MD;  Location: [facility];  Service: General;  Laterality: Left;  . PORT-A-CATH REMOVAL Right 05/20/2018   Procedure: MINOR REMOVAL PORT-A-CATH;  Surgeon:  Aviva Signs, MD;  Location: [facility];  Service: General;  Laterality: Right;  . PORTACATH PLACEMENT N/A 07/17/2016   Procedure: INSERTION OF TUNNELED CENTRAL VENOUS CATHETER WITH SUBCUTANEOUS PORT;  Surgeon: Vickie Epley, MD;  Location: [facility];  Service: Vascular;  Laterality: N/A;  . TUBAL LIGATION       SOCIAL HISTORY:  Social History   Socioeconomic History  . Marital status: Single    Spouse name: Not on file  . Number of children: Not on file  . Years of education: Not on file  . Highest education level: Not on file  Occupational History  . Not on file  Social Needs  . Financial resource strain: Not on file  . Food insecurity    Worry: Not on file    Inability: Not on file  . Transportation needs    Medical: Not on file    Non-medical: Not on file  Tobacco Use  . Smoking status: Current Every Day Smoker    Packs/day: 0.50    Years: 40.00    Pack years: 20.00    Types: Cigarettes  . Smokeless tobacco: Never Used  Substance and Sexual Activity  . Alcohol use: No  . Drug use: No  . Sexual activity: Yes    Birth control/protection: Post-menopausal  Lifestyle  . Physical activity    Days per week: Not on file    Minutes per session: Not on file  . Stress: Not on file  Relationships  . Social Herbalist on phone: Not on file    Gets together: Not on file    Attends religious service: Not on file    Active member of club or organization: Not on file    Attends meetings of clubs or organizations: Not on file    Relationship status: Not on file  . Intimate partner violence    Fear of current or ex partner: Not on file    Emotionally abused: Not on file    Physically abused: Not on file    Forced sexual activity: Not on file  Other Topics Concern  . Not on file  Social History Narrative  . Not on file    FAMILY HISTORY:  Family History  Problem Relation Age of Onset  . Cancer Mother        colon  . Depression Mother   . Heart disease Father    . Depression Sister   . Cancer Sister        leukemia  . Hyperlipidemia Brother   . Hypertension Brother     CURRENT MEDICATIONS:  Outpatient Encounter Medications as of 02/15/2019  Medication Sig  . acetaminophen (TYLENOL) 500 MG tablet Take 500-1,000 mg by mouth every 6 (six) hours as needed for mild pain or moderate pain.  Marland Kitchen ALPRAZolam (XANAX) 0.5 MG tablet Take 0.25-0.5 mg by mouth daily as needed for anxiety.   Marland Kitchen buPROPion (WELLBUTRIN XL) 150 MG 24 hr tablet Take 150 mg by mouth daily.  . meloxicam (MOBIC) 15 MG tablet Take 1 tablet (15 mg total) by mouth daily.  . pantoprazole (PROTONIX) 40 MG tablet Take 40 mg by mouth daily. Pt takes at supper daily  . gabapentin (NEURONTIN) 100 MG capsule TAKE 1 CAPSULE BY MOUTH 2 TIMES A DAY. (Patient not  taking: Reported on 02/15/2019)  . methocarbamol (ROBAXIN) 500 MG tablet   . [DISCONTINUED] simvastatin (ZOCOR) 40 MG tablet Take 40 mg by mouth daily.   No facility-administered encounter medications on file as of 02/15/2019.     ALLERGIES:  Allergies  Allergen Reactions  . Amoxicillin Nausea And Vomiting    Yeast Infections  Has patient had a PCN reaction causing immediate rash, facial/tongue/throat swelling, SOB or lightheadedness with hypotension: No Has patient had a PCN reaction causing severe rash involving mucus membranes or skin necrosis: No Has patient had a PCN reaction that required hospitalization No Has patient had a PCN reaction occurring within the last 10 years: No If all of the above answers are "NO", then may proceed with Cephalosporin use.   Marland Kitchen Choline Fenofibrate     REACTION: Abd pain and elevated Lipase  . Penicillins     Yeast infections, thrush  Has patient had a PCN reaction causing immediate rash, facial/tongue/throat swelling, SOB or lightheadedness with hypotension: No Has patient had a PCN reaction causing severe rash involving mucus membranes or skin necrosis: No Has patient had a PCN reaction that  required hospitalization No Has patient had a PCN reaction occurring within the last 10 years: No If all of the above answers are "NO", then may proceed with Cephalosporin use.   Marland Kitchen Percocet [Oxycodone-Acetaminophen] Nausea And Vomiting     PHYSICAL EXAM:  ECOG Performance status: 1  Vitals:   02/15/19 1206  BP: 118/61  Pulse: 99  Resp: 18  Temp: 98.6 F (37 C)  SpO2: 98%   Filed Weights   02/15/19 1206  Weight: 111 lb 11.2 oz (50.7 kg)    Physical Exam Constitutional:      Appearance: Normal appearance.  HENT:     Head: Normocephalic.     Nose: Nose normal.     Mouth/Throat:     Mouth: Mucous membranes are moist.     Pharynx: Oropharynx is clear.  Eyes:     Extraocular Movements: Extraocular movements intact.     Conjunctiva/sclera: Conjunctivae normal.  Neck:     Musculoskeletal: Normal range of motion.  Cardiovascular:     Rate and Rhythm: Normal rate and regular rhythm.     Pulses: Normal pulses.     Heart sounds: Normal heart sounds.  Pulmonary:     Effort: Pulmonary effort is normal.     Breath sounds: Normal breath sounds.  Abdominal:     General: Bowel sounds are normal.     Palpations: Abdomen is soft.  Musculoskeletal: Normal range of motion.  Skin:    General: Skin is warm.  Neurological:     General: No focal deficit present.     Mental Status: She is alert and oriented to person, place, and time.  Psychiatric:        Mood and Affect: Mood normal.        Behavior: Behavior normal.        Thought Content: Thought content normal.        Judgment: Judgment normal.      LABORATORY DATA:  I have reviewed the labs as listed.  CBC    Component Value Date/Time   WBC 7.0 10/27/2018 1200   RBC 4.72 10/27/2018 1200   HGB 15.1 (H) 10/27/2018 1200   HCT 47.2 (H) 10/27/2018 1200   PLT 289 10/27/2018 1200   MCV 100.0 10/27/2018 1200   MCH 32.0 10/27/2018 1200   MCHC 32.0 10/27/2018 1200   RDW 13.8 10/27/2018  1200   LYMPHSABS 1.1 10/27/2018  1200   MONOABS 0.5 10/27/2018 1200   EOSABS 0.1 10/27/2018 1200   BASOSABS 0.1 10/27/2018 1200   CMP Latest Ref Rng & Units 10/27/2018 07/31/2018 06/17/2018  Glucose 70 - 99 mg/dL 117(H) 107(H) 117(H)  BUN 8 - 23 mg/dL 7(L) 10 7(L)  Creatinine 0.44 - 1.00 mg/dL 0.80 0.70 0.83  Sodium 135 - 145 mmol/L 139 138 139  Potassium 3.5 - 5.1 mmol/L 3.5 4.0 4.6  Chloride 98 - 111 mmol/L 102 107 103  CO2 22 - 32 mmol/L '27 24 27  '$ Calcium 8.9 - 10.3 mg/dL 9.2 9.3 9.8  Total Protein 6.5 - 8.1 g/dL 7.1 - 7.2  Total Bilirubin 0.3 - 1.2 mg/dL 0.6 - 0.5  Alkaline Phos 38 - 126 U/L 96 - 101  AST 15 - 41 U/L 18 - 17  ALT 0 - 44 U/L 14 - 12       ASSESSMENT & PLAN:   Lymphoma, large-cell, diffuse ([facility]) 1.  Stage IIIb large B-cell lymphoma: -Presentation with weight loss, night sweats, adenopathy in the neck, abdomen and right inguinal area -6 cycles of R-CHOP from 07/30/2016 through 11/12/2016 - Denies fevers, night sweats or weight loss.  Denies any recurrent infections or hospitalizations. -CT CAP on 06/21/2018 did not show any evidence of adenopathy in the chest, abdomen and pelvis.  Innumerable new groundglass nodules in both lungs. -She is current active smoker. - I reviewed results of the CT chest with contrast dated 10/27/2018 which did not show any thoracic adenopathy to suggest recurrent or residual lymphoma.  Resolution of the majority of previously described groundglass nodules.  A left upper lobe 6 mm groundglass nodule persists. - Follow-up CT scan in 2 years was recommended. - Today's physical examination did not reveal any palpable adenopathy.    2. Left Femur pain: -Developed left femur pain in June secondary to injury. On today's exam, patient experience pain with gentle palpation of the left femur.  Plain films performed on February 10, 2019 did not reveal any acute findings.  I have recommended an MRI of left femur.  However, patient states she is very claustrophobic and would not be able to  undergo MRI.  Therefore, I have recommend proceed with CT of left femur to further rule out any underlying etiology.   - She will return to clinic in 2 weeks to discuss CT results.  3.  Left colon thickening: -Her last colonoscopy was in 1999. - Her last CT scan showed thickening of the sigmoid colon area.  The current CT scan on 06/21/2018 showed improved appearance of the colon since 12/21/2017.  She denies any bleeding per rectum or melena.  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  4.  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 - She will continue gabapentin daily.      Orders placed this encounter:  Orders Placed This Encounter  Procedures  . CT FEMUR LEFT W WO CONTRAST  . CBC with Differential  . Comprehensive metabolic panel  . Lactate dehydrogenase     Roger Shelter, [place] [phone]

## 2019-02-17 ENCOUNTER — Other Ambulatory Visit (HOSPITAL_COMMUNITY): Payer: Self-pay | Admitting: *Deleted

## 2019-02-17 DIAGNOSIS — C8330 Diffuse large B-cell lymphoma, unspecified site: Secondary | ICD-10-CM

## 2019-02-20 ENCOUNTER — Other Ambulatory Visit (HOSPITAL_COMMUNITY): Payer: Self-pay | Admitting: Hematology

## 2019-02-20 ENCOUNTER — Ambulatory Visit (HOSPITAL_COMMUNITY)
Admission: RE | Admit: 2019-02-20 | Discharge: 2019-02-20 | Disposition: A | Discharge status: Home / Self Care | Payer: BC Managed Care – PPO | Source: Ambulatory Visit | Attending: Hematology | Admitting: Hematology

## 2019-02-20 ENCOUNTER — Inpatient Hospital Stay (HOSPITAL_COMMUNITY): Payer: BC Managed Care – PPO

## 2019-02-20 ENCOUNTER — Other Ambulatory Visit: Payer: Self-pay

## 2019-02-20 DIAGNOSIS — F419 Anxiety disorder, unspecified: Secondary | ICD-10-CM | POA: Diagnosis not present

## 2019-02-20 DIAGNOSIS — Z79899 Other long term (current) drug therapy: Secondary | ICD-10-CM | POA: Diagnosis not present

## 2019-02-20 DIAGNOSIS — C8330 Diffuse large B-cell lymphoma, unspecified site: Secondary | ICD-10-CM

## 2019-02-20 DIAGNOSIS — J439 Emphysema, unspecified: Secondary | ICD-10-CM | POA: Diagnosis not present

## 2019-02-20 DIAGNOSIS — I252 Old myocardial infarction: Secondary | ICD-10-CM | POA: Diagnosis not present

## 2019-02-20 DIAGNOSIS — G629 Polyneuropathy, unspecified: Secondary | ICD-10-CM | POA: Diagnosis not present

## 2019-02-20 DIAGNOSIS — F17210 Nicotine dependence, cigarettes, uncomplicated: Secondary | ICD-10-CM | POA: Diagnosis not present

## 2019-02-20 DIAGNOSIS — Z9221 Personal history of antineoplastic chemotherapy: Secondary | ICD-10-CM | POA: Diagnosis not present

## 2019-02-20 DIAGNOSIS — I10 Essential (primary) hypertension: Secondary | ICD-10-CM | POA: Diagnosis not present

## 2019-02-20 DIAGNOSIS — E785 Hyperlipidemia, unspecified: Secondary | ICD-10-CM | POA: Diagnosis not present

## 2019-02-20 DIAGNOSIS — Z800 Family history of malignant neoplasm of digestive organs: Secondary | ICD-10-CM | POA: Diagnosis not present

## 2019-02-20 LAB — CBC WITH DIFFERENTIAL/PLATELET
Abs Immature Granulocytes: 0.03 10*3/uL (ref 0.00–0.07)
Basophils Absolute: 0.1 10*3/uL (ref 0.0–0.1)
Basophils Relative: 1 %
Eosinophils Absolute: 0.1 10*3/uL (ref 0.0–0.5)
Eosinophils Relative: 2 %
HCT: 46.7 % — ABNORMAL HIGH (ref 36.0–46.0)
Hemoglobin: 15.2 g/dL — ABNORMAL HIGH (ref 12.0–15.0)
Immature Granulocytes: 1 %
Lymphocytes Relative: 26 %
Lymphs Abs: 1.5 10*3/uL (ref 0.7–4.0)
MCH: 31.6 pg (ref 26.0–34.0)
MCHC: 32.5 g/dL (ref 30.0–36.0)
MCV: 97.1 fL (ref 80.0–100.0)
Monocytes Absolute: 0.5 10*3/uL (ref 0.1–1.0)
Monocytes Relative: 9 %
Neutro Abs: 3.5 10*3/uL (ref 1.7–7.7)
Neutrophils Relative %: 61 %
Platelets: 310 10*3/uL (ref 150–400)
RBC: 4.81 MIL/uL (ref 3.87–5.11)
RDW: 13.2 % (ref 11.5–15.5)
WBC: 5.7 10*3/uL (ref 4.0–10.5)
nRBC: 0 % (ref 0.0–0.2)

## 2019-02-20 LAB — COMPREHENSIVE METABOLIC PANEL
ALT: 17 U/L (ref 0–44)
AST: 16 U/L (ref 15–41)
Albumin: 4.2 g/dL (ref 3.5–5.0)
Alkaline Phosphatase: 93 U/L (ref 38–126)
Anion gap: 7 (ref 5–15)
BUN: 9 mg/dL (ref 8–23)
CO2: 28 mmol/L (ref 22–32)
Calcium: 9.5 mg/dL (ref 8.9–10.3)
Chloride: 105 mmol/L (ref 98–111)
Creatinine, Ser: 0.79 mg/dL (ref 0.44–1.00)
GFR calc Af Amer: >60 mL/min (ref >60)
GFR calc non Af Amer: >60 mL/min (ref >60)
Glucose, Bld: 113 mg/dL — ABNORMAL HIGH (ref 70–99)
Potassium: 3.7 mmol/L (ref 3.5–5.1)
Sodium: 140 mmol/L (ref 135–145)
Total Bilirubin: 0.6 mg/dL (ref 0.3–1.2)
Total Protein: 6.9 g/dL (ref 6.5–8.1)

## 2019-02-20 LAB — LACTATE DEHYDROGENASE: LDH: 137 U/L (ref 98–192)

## 2019-02-20 MED ORDER — IOHEXOL 300 MG/ML  SOLN
100.0000 mL | Freq: Once | INTRAMUSCULAR | Status: AC | PRN
  Administered 2019-02-20: 100 mL via INTRAVENOUS

## 2019-02-22 ENCOUNTER — Inpatient Hospital Stay (HOSPITAL_BASED_OUTPATIENT_CLINIC_OR_DEPARTMENT_OTHER): Payer: BC Managed Care – PPO | Admitting: Hematology

## 2019-02-22 ENCOUNTER — Other Ambulatory Visit: Payer: Self-pay

## 2019-02-22 VITALS — BP 120/68 | HR 91 | Temp 96.9°F | Resp 16 | Wt 110.7 lb

## 2019-02-22 DIAGNOSIS — Z79899 Other long term (current) drug therapy: Secondary | ICD-10-CM | POA: Diagnosis not present

## 2019-02-22 DIAGNOSIS — F17210 Nicotine dependence, cigarettes, uncomplicated: Secondary | ICD-10-CM

## 2019-02-22 DIAGNOSIS — C8330 Diffuse large B-cell lymphoma, unspecified site: Secondary | ICD-10-CM | POA: Diagnosis not present

## 2019-02-22 DIAGNOSIS — J439 Emphysema, unspecified: Secondary | ICD-10-CM

## 2019-02-22 DIAGNOSIS — Z800 Family history of malignant neoplasm of digestive organs: Secondary | ICD-10-CM | POA: Diagnosis not present

## 2019-02-22 DIAGNOSIS — I10 Essential (primary) hypertension: Secondary | ICD-10-CM | POA: Diagnosis not present

## 2019-02-22 DIAGNOSIS — E785 Hyperlipidemia, unspecified: Secondary | ICD-10-CM

## 2019-02-22 DIAGNOSIS — G629 Polyneuropathy, unspecified: Secondary | ICD-10-CM | POA: Diagnosis not present

## 2019-02-22 DIAGNOSIS — F419 Anxiety disorder, unspecified: Secondary | ICD-10-CM | POA: Diagnosis not present

## 2019-02-22 DIAGNOSIS — I252 Old myocardial infarction: Secondary | ICD-10-CM

## 2019-02-22 DIAGNOSIS — Z9221 Personal history of antineoplastic chemotherapy: Secondary | ICD-10-CM

## 2019-02-22 MED ORDER — GABAPENTIN 100 MG PO CAPS: ORAL_CAPSULE | ORAL | 6 refills | Status: DC

## 2019-02-22 MED ORDER — GABAPENTIN 100 MG PO CAPS: ORAL_CAPSULE | ORAL | 0 refills | Status: DC

## 2019-02-22 NOTE — Assessment & Plan Note (Signed)
1.  Stage IIIb large B-cell lymphoma: -Presentation with weight loss, night sweats, adenopathy in the neck, abdomen and right inguinal area -6 cycles of R-CHOP from 07/30/2016 through 11/12/2016 - Denies fevers, night sweats or weight loss.  Denies any recurrent infections or hospitalizations. -CT CAP on 06/21/2018 did not show any evidence of adenopathy in the chest, abdomen and pelvis.  Innumerable new groundglass nodules in both lungs. -She is current active smoker. - I reviewed results of the CT chest with contrast dated 10/27/2018 which did not show any thoracic adenopathy to suggest recurrent or residual lymphoma.  Resolution of the majority of previously described groundglass nodules.  A left upper lobe 6 mm groundglass nodule persists. - Follow-up CT scan in 2 years was recommended. - Today's physical examination did not reveal any palpable adenopathy.   -She will return to clinic in 6 months.  2. Left Femur pain: -Developed left femur pain in June secondary to injury. On today's exam, patient experience pain with gentle palpation of the left femur.  Plain films performed on February 10, 2019 did not reveal any acute findings.  I have recommended an MRI of left femur.  However, patient states she is very claustrophobic and would not be able to undergo MRI.  Therefore, I have recommend proceed with CT of left femur to further rule out any underlying etiology.   -CT femur on 02/20/2019 revealed no acute osseous abnormality.  Revealed left hip osteoarthritis as well as left knee medial compartment osteoarthritis. Of note, diffuse bowel wall thickening of the sigmoid colon was again noted which was previously seen on prior CTs dating back to 2019.  I discussed this finding with the patient and recommend patient follow-up with GI for colonoscopy.  Patient states she refuses to do a colonoscopy.  Her mother did die of colon cancer.   3.  Left colon thickening: -Her last colonoscopy was in 1999. - Her last  CT scan showed thickening of the sigmoid colon area.  The current CT scan on 06/21/2018 showed improved appearance of the colon since 12/21/2017. -Again, CT of femur showed diffuse bowel wall thickening of the sigmoid colon.  Have recommend patient proceed with a GI consult with colonoscopy.  Patient refuses.  She denies any bleeding per rectum or melena.  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  4.  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 - We will refill her prescription for gabapentin today.

## 2019-02-22 NOTE — Progress Notes (Signed)
[place], [place] [address]   CLINIC:  Medical Oncology/Hematology  PCP:  Celene Squibb, MD [place] [address] [phone]   REASON FOR VISIT:  Follow-up for Lymphoma  CURRENT THERAPY: Clinical surveillance  BRIEF ONCOLOGIC HISTORY:  Oncology History  Lymphoma, large-cell, diffuse ([place])  07/13/2016 Initial Biopsy   L inguinal biopsy with Dr. 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 Grade (if applicable): High grade. Flow cytometry: [id number] reveals a monoclonal B-cell population with expression of CD10. Immunohistochemical stains: CD20, CD3, CD10, bcl-2, bcl-6, CD21, CD23, Ki-67. Touch preps/imprints: Mixture of large and smaller lymphocytes with irregular nuclear contours. Comments: Sections of lymph node reveal effacement of the architecture by a nodular proliferation of neoplastic appearing follicles. In many of the areas the follicles coalesce and form sheets of large cells. The more formed follicles predominately consist of numerous large centroblasts (>15 per hpf), representing a high grade (3B) follicular component. The diffuse areas are also composed of large cells but lack follicular dendritic networks (CD21, CD23). Immunohistochemistry reveals the atypical lymphocytes are positive for CD20, CD10, and bcl-6. They are negative for bcl-2. Ki-67 is elevated (30% overall). CD3 reveals residual surrounding T-cells. Flow cytometry ([id number]) reveals a monoclonal B-cell population with expression of CD10. 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 The case was called to Dr. Rosana Hoes on 07/16/2016.   07/23/2016 Echocardiogram   Left  ventricle: Systolic function was normal. The estimated ejection fraction was in the range of 50% to 55%. Doppler parameters are consistent with abnormal left ventricular relaxation (grade 1 diastolic dysfunction).   07/24/2016 Procedure   Technically successful CT guided right iliac bone core and aspiration biopsy by IR.   07/28/2016 PET scan   1. Hypermetabolic lymphadenopathy in the neck, abdomen and right inguinal area as described above, consistent with known lymphoma. 2. Focal area of hypermetabolism in the appendix could be lymphomatous involvement. Recommend attention on follow-up scans. 3. Age advanced atherosclerotic calcifications involving the abdominal aorta and branch vessels and age advanced three-vessel coronary artery calcifications. 4. No obvious osseous involvement. 5. Emphysematous changes and pulmonary scarring.   07/28/2016 Pathology Results   Bone Marrow, Aspirate,Biopsy, and Clot, right iliac - NORMOCELLULAR BONE MARROW WITH TRILINEAGE HEMATOPOIESIS. PERIPHERAL BLOOD: - OCCASIONAL CIRCULATING ATYPICAL LYMPHOID CELLS.   07/28/2016 Pathology Results   Bone Marrow Flow Cytometry - NO MONOCLONAL B CELL POPULATION OR ABNORMAL T CELL PHENOTYPE IDENTIFIED.   07/30/2016 Pathology Results   Peripheral Blood Flow Cytometry - NO MONOCLONAL B-CELL POPULATION OR ABNORMAL T-CELL PHENOTYPE IDENTIFIED.   07/30/2016 -  Chemotherapy   The patient had DOXOrubicin (ADRIAMYCIN) chemo injection 78 mg, 50 mg/m2 = 78 mg, Intravenous,  Once, 2 of 6 cycles  palonosetron (ALOXI) injection 0.25 mg, 0.25 mg, Intravenous,  Once, 2 of 6 cycles  pegfilgrastim (NEULASTA ONPRO KIT) injection 6 mg, 6 mg, Subcutaneous, Once, 2 of 6 cycles  vinCRIStine (ONCOVIN) 2 mg in sodium chloride 0.9 % 50 mL chemo infusion, 2 mg, Intravenous,  Once, 2 of 6 cycles  riTUXimab (RITUXAN) 600 mg in sodium chloride 0.9 % 250 mL (1.9355 mg/mL) chemo infusion, 375 mg/m2 = 600 mg, Intravenous,  Once, 2 of 6  cycles  cyclophosphamide (CYTOXAN) 1,180 mg in  sodium chloride 0.9 % 250 mL chemo infusion, 750 mg/m2 = 1,180 mg, Intravenous,  Once, 2 of 6 cycles  for chemotherapy treatment.     08/03/2016 Pathology Results   Cytogenetic lab results [facility]). Cytogenetic Analysis: Normal.    09/23/2016 PET scan   1. Significantly improved appearance, with resolution of prior hypermetabolic activity in all of the previously involved lymph nodes. All lymph nodes are currently within normal size limits. 2. Other imaging findings of potential clinical significance: Chronic right sphenoid sinusitis. Right mastoid effusion. Coronary, aortic arch, and branch vessel atherosclerotic vascular disease. Severe emphysema. Aortoiliac atherosclerotic vascular disease.   11/12/2016 -  Chemotherapy   Completed cycle 6 of RCHOP    12/11/2016 PET scan   1. No evidence of residual hypermetabolic lymphoma or adenopathy. 2. Age advanced coronary artery atherosclerosis. Recommend assessment of coronary risk factors and consideration of medical therapy. 3. Centrilobular emphysema.   06/18/2017 PET scan   IMPRESSION: 1. No new or recurrent hypermetabolic adenopathy in the neck, chest, abdomen, or pelvis. 2. There is potentially some mild wall thickening along a 6 cm segment of the distal ileum. Scattered accentuated metabolic activity in the bowel, likely physiologic. There is some fat deposition in the proximal colon wall, which is typically considered incidental although there is a weak association with inflammatory bowel disease. 3. Aortic Atherosclerosis (ICD10-I70.0) and Emphysema (ICD10-J43.9). Coronary atherosclerosis.      CANCER STAGING: Cancer Staging Lymphoma, large-cell, diffuse ([place]) Staging form: Hodgkin and Non-Hodgkin Lymphoma, AJCC 8th Edition - Clinical stage from 07/31/2016: Stage III (Diffuse large B-cell lymphoma) - Signed by Baird Cancer, PA-C on 07/31/2016    INTERVAL HISTORY:  Gabriella Love 63 y.o. female presents today for follow-up.  She reports overall doing well.  She continues with reports of left thigh pain.  She had a CT of her femur is here to discuss results.  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  She states she was on gabapentin and this helped greatly.  She is inquiring about a prescription for this.  She denies any fevers, chills, night sweats.  No weight loss.  REVIEW OF SYSTEMS:  Review of Systems  Constitutional: Negative.   HENT:  Negative.   Eyes: Negative.   Respiratory: Negative.   Cardiovascular: Negative.   Gastrointestinal: Negative.   Endocrine: Negative.   Genitourinary: Negative.    Musculoskeletal: Positive for arthralgias. Negative for gait problem.       Left Leg pain and tenderness   Skin: Negative.   Neurological: Negative.  Negative for dizziness and gait problem.  Hematological: Negative.   Psychiatric/Behavioral: Negative.      PAST MEDICAL/SURGICAL HISTORY:  Past Medical History:  Diagnosis Date  . Anxiety   . Arthritis   . DLBCL (diffuse large B cell lymphoma) ([place]) 07/16/2016  . GERD (gastroesophageal reflux disease)   . Heart disease   . Hyperlipidemia   . Hypertension   . Irritable bowel syndrome (IBS)    with diarrhea  . Myocardial infarction ([place]) 12/19/2009   released from cardiology   Past Surgical History:  Procedure Laterality Date  . CARDIAC CATHETERIZATION     cardiac stent  . CHOLECYSTECTOMY    . CORONARY STENT PLACEMENT  2011  . INGUINAL LYMPH NODE BIOPSY Left 07/13/2016   Procedure: EXCISIONAL BIOPSY OF LEFT INGUINAL LYMPH NODE;  Surgeon: Vickie Epley, MD;  Location: [facility];  Service: General;  Laterality: Left;  . PORT-A-CATH REMOVAL Right 05/20/2018  Procedure: MINOR REMOVAL PORT-A-CATH;  Surgeon: Aviva Signs, MD;  Location: [facility];  Service: General;  Laterality: Right;  . PORTACATH PLACEMENT N/A 07/17/2016   Procedure: INSERTION OF TUNNELED  CENTRAL VENOUS CATHETER WITH SUBCUTANEOUS PORT;  Surgeon: Vickie Epley, MD;  Location: [facility];  Service: Vascular;  Laterality: N/A;  . TUBAL LIGATION       SOCIAL HISTORY:  Social History   Socioeconomic History  . Marital status: Single    Spouse name: Not on file  . Number of children: Not on file  . Years of education: Not on file  . Highest education level: Not on file  Occupational History  . Not on file  Social Needs  . Financial resource strain: Not on file  . Food insecurity    Worry: Not on file    Inability: Not on file  . Transportation needs    Medical: Not on file    Non-medical: Not on file  Tobacco Use  . Smoking status: Current Every Day Smoker    Packs/day: 0.50    Years: 40.00    Pack years: 20.00    Types: Cigarettes  . Smokeless tobacco: Never Used  Substance and Sexual Activity  . Alcohol use: No  . Drug use: No  . Sexual activity: Yes    Birth control/protection: Post-menopausal  Lifestyle  . Physical activity    Days per week: Not on file    Minutes per session: Not on file  . Stress: Not on file  Relationships  . Social Herbalist on phone: Not on file    Gets together: Not on file    Attends religious service: Not on file    Active member of club or organization: Not on file    Attends meetings of clubs or organizations: Not on file    Relationship status: Not on file  . Intimate partner violence    Fear of current or ex partner: Not on file    Emotionally abused: Not on file    Physically abused: Not on file    Forced sexual activity: Not on file  Other Topics Concern  . Not on file  Social History Narrative  . Not on file    FAMILY HISTORY:  Family History  Problem Relation Age of Onset  . Cancer Mother        colon  . Depression Mother   . Heart disease Father   . Depression Sister   . Cancer Sister        leukemia  . Hyperlipidemia Brother   . Hypertension Brother     CURRENT MEDICATIONS:   Outpatient Encounter Medications as of 02/22/2019  Medication Sig  . acetaminophen (TYLENOL) 500 MG tablet Take 500-1,000 mg by mouth every 6 (six) hours as needed for mild pain or moderate pain.  Marland Kitchen ALPRAZolam (XANAX) 0.5 MG tablet Take 0.25-0.5 mg by mouth daily as needed for anxiety.   Marland Kitchen buPROPion (WELLBUTRIN XL) 150 MG 24 hr tablet Take 150 mg by mouth daily.  . pantoprazole (PROTONIX) 40 MG tablet Take 40 mg by mouth daily. Pt takes at supper daily  . gabapentin (NEURONTIN) 100 MG capsule TAKE 1 CAPSULE BY MOUTH 2 TIMES A DAY. (Patient not taking: Reported on 02/15/2019)  . meloxicam (MOBIC) 15 MG tablet Take 1 tablet (15 mg total) by mouth daily.  . methocarbamol (ROBAXIN) 500 MG tablet   . [DISCONTINUED] buPROPion (WELLBUTRIN SR) 150 MG 12 hr tablet   . [DISCONTINUED]  predniSONE (STERAPRED UNI-PAK 21 TAB) 10 MG (21) TBPK tablet    No facility-administered encounter medications on file as of 02/22/2019.     ALLERGIES:  Allergies  Allergen Reactions  . Amoxicillin Nausea And Vomiting    Yeast Infections  Has patient had a PCN reaction causing immediate rash, facial/tongue/throat swelling, SOB or lightheadedness with hypotension: No Has patient had a PCN reaction causing severe rash involving mucus membranes or skin necrosis: No Has patient had a PCN reaction that required hospitalization No Has patient had a PCN reaction occurring within the last 10 years: No If all of the above answers are "NO", then may proceed with Cephalosporin use.   Marland Kitchen Choline Fenofibrate     REACTION: Abd pain and elevated Lipase  . Penicillins     Yeast infections, thrush  Has patient had a PCN reaction causing immediate rash, facial/tongue/throat swelling, SOB or lightheadedness with hypotension: No Has patient had a PCN reaction causing severe rash involving mucus membranes or skin necrosis: No Has patient had a PCN reaction that required hospitalization No Has patient had a PCN reaction occurring within  the last 10 years: No If all of the above answers are "NO", then may proceed with Cephalosporin use.   Marland Kitchen Percocet [Oxycodone-Acetaminophen] Nausea And Vomiting     PHYSICAL EXAM:  ECOG Performance status: 1  Vitals:   02/22/19 0920  BP: 120/68  Pulse: 91  Resp: 16  Temp: (!) 96.9 F (36.1 C)  SpO2: 99%   Filed Weights   02/22/19 0920  Weight: 110 lb 11.2 oz (50.2 kg)    Physical Exam Constitutional:      Appearance: Normal appearance.  HENT:     Head: Normocephalic.     Nose: Nose normal.     Mouth/Throat:     Mouth: Mucous membranes are moist.     Pharynx: Oropharynx is clear.  Eyes:     Extraocular Movements: Extraocular movements intact.     Conjunctiva/sclera: Conjunctivae normal.  Neck:     Musculoskeletal: Normal range of motion.  Cardiovascular:     Rate and Rhythm: Normal rate and regular rhythm.     Pulses: Normal pulses.     Heart sounds: Normal heart sounds.  Pulmonary:     Effort: Pulmonary effort is normal.     Breath sounds: Normal breath sounds.  Abdominal:     General: Bowel sounds are normal.     Palpations: Abdomen is soft.  Musculoskeletal: Normal range of motion.  Skin:    General: Skin is warm.  Neurological:     General: No focal deficit present.     Mental Status: She is alert and oriented to person, place, and time.  Psychiatric:        Mood and Affect: Mood normal.        Behavior: Behavior normal.        Thought Content: Thought content normal.        Judgment: Judgment normal.      LABORATORY DATA:  I have reviewed the labs as listed.  CBC    Component Value Date/Time   WBC 5.7 02/20/2019 1337   RBC 4.81 02/20/2019 1337   HGB 15.2 (H) 02/20/2019 1337   HCT 46.7 (H) 02/20/2019 1337   PLT 310 02/20/2019 1337   MCV 97.1 02/20/2019 1337   MCH 31.6 02/20/2019 1337   MCHC 32.5 02/20/2019 1337   RDW 13.2 02/20/2019 1337   LYMPHSABS 1.5 02/20/2019 1337   MONOABS 0.5 02/20/2019 1337  EOSABS 0.1 02/20/2019 1337    BASOSABS 0.1 02/20/2019 1337   CMP Latest Ref Rng & Units 02/20/2019 10/27/2018 07/31/2018  Glucose 70 - 99 mg/dL 113(H) 117(H) 107(H)  BUN 8 - 23 mg/dL 9 7(L) 10  Creatinine 0.44 - 1.00 mg/dL 0.79 0.80 0.70  Sodium 135 - 145 mmol/L 140 139 138  Potassium 3.5 - 5.1 mmol/L 3.7 3.5 4.0  Chloride 98 - 111 mmol/L 105 102 107  CO2 22 - 32 mmol/L _0 Calcium 8.9 - 10.3 mg/dL 9.5 9.2 9.3  Total Protein 6.5 - 8.1 g/dL 6.9 7.1 -  Total Bilirubin 0.3 - 1.2 mg/dL 0.6 0.6 -  Alkaline Phos 38 - 126 U/L 93 96 -  AST 15 - 41 U/L 16 18 -  ALT 0 - 44 U/L 17 14 -       ASSESSMENT & PLAN:   Lymphoma, large-cell, diffuse ([facility]) 1.  Stage IIIb large B-cell lymphoma: -Presentation with weight loss, night sweats, adenopathy in the neck, abdomen and right inguinal area -6 cycles of R-CHOP from 07/30/2016 through 11/12/2016 - Denies fevers, night sweats or weight loss.  Denies any recurrent infections or hospitalizations. -CT CAP on 06/21/2018 did not show any evidence of adenopathy in the chest, abdomen and pelvis.  Innumerable new groundglass nodules in both lungs. -She is current active smoker. - I reviewed results of the CT chest with contrast dated 10/27/2018 which did not show any thoracic adenopathy to suggest recurrent or residual lymphoma.  Resolution of the majority of previously described groundglass nodules.  A left upper lobe 6 mm groundglass nodule persists. - Follow-up CT scan in 2 years was recommended. - Today's physical examination did not reveal any palpable adenopathy.   -She will return to clinic in 6 months.  2. Left Femur pain: -Developed left femur pain in June secondary to injury. On today's exam, patient experience pain with gentle palpation of the left femur.  Plain films performed on February 10, 2019 did not reveal any acute findings.  I have recommended an MRI of left femur.  However, patient states she is very claustrophobic and would not be able to undergo MRI.  Therefore, I have  recommend proceed with CT of left femur to further rule out any underlying etiology.   -CT femur on 02/20/2019 revealed no acute osseous abnormality.  Revealed left hip osteoarthritis as well as left knee medial compartment osteoarthritis. Of note, diffuse bowel wall thickening of the sigmoid colon was again noted which was previously seen on prior CTs dating back to 2019.  I discussed this finding with the patient and recommend patient follow-up with GI for colonoscopy.  Patient states she refuses to do a colonoscopy.  Her mother did die of colon cancer.   3.  Left colon thickening: -Her last colonoscopy was in 1999. - Her last CT scan showed thickening of the sigmoid colon area.  The current CT scan on 06/21/2018 showed improved appearance of the colon since 12/21/2017. -Again, CT of femur showed diffuse bowel wall thickening of the sigmoid colon.  Have recommend patient proceed with a GI consult with colonoscopy.  Patient refuses.  She denies any bleeding per rectum or melena.  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  4.  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 - We will refill her prescription for gabapentin today.      Orders placed this encounter:  Orders Placed This Encounter  Procedures  . CBC with Differential  .  Comprehensive metabolic panel  . Lactate dehydrogenase     Roger Shelter, [place] [phone]

## 2019-03-06 ENCOUNTER — Other Ambulatory Visit: Payer: Self-pay

## 2019-03-06 ENCOUNTER — Ambulatory Visit (INDEPENDENT_AMBULATORY_CARE_PROVIDER_SITE_OTHER): Payer: BC Managed Care – PPO | Admitting: Orthopedic Surgery

## 2019-03-06 VITALS — BP 121/73 | HR 91 | Temp 97.2°F | Ht 61.5 in | Wt 110.0 lb

## 2019-03-06 DIAGNOSIS — M25552 Pain in left hip: Secondary | ICD-10-CM | POA: Diagnosis not present

## 2019-03-06 NOTE — Progress Notes (Signed)
Progress Note   Patient ID: Gabriella Love, female   DOB: 03-Feb-1956, 63 y.o.   MRN: [id number]   Chief Complaint  Patient presents with  . Follow-up    Recheck on left hip pain.    Encounter Diagnosis  Name Primary?  . Pain in left hip Yes    63 year old female treated for acute left hip pain  Says she did well with the anti-inflammatories and is now pain-free  Chief Complaint  Patient presents with  . Hip Pain      Left hip in the groin area (pt states had lymph node removed from that area) going around to hip for 1 month   63 years old had a history of large B cell non-Hodgkin's lymphoma developed left groin pain on June 20 somewhat related to moving a water bed on June 17.  She was treated with meloxicam prednisone robaxin noticed some mild improvement but persisting groin and left thigh pain which is dull aching somewhat interfering with her ability to walk   Associated with a rash that has developed in her groin area which she is concerned because of the lymphoma and was previously told that rashes could be a sign of recurrent disease     Review of Systems  Musculoskeletal: Positive for joint pain.  All other systems reviewed and are negative. No change from February 13, 2019    BP 121/73   Pulse 91   Temp (!) 97.2 F (36.2 C)   Ht 5' 1.5" (1.562 m)   Wt 110 lb (49.9 kg)   BMI 20.45 kg/m   Physical Exam Musculoskeletal:     Comments: She walked very well with no restrictions to gait no limping was noted  Leg lengths were equal  Hip range of motion was normal.  There is no tenderness in the hip flexors        Medical decisions:  (Established problem worse, x-ray ,physical therapy, over-the-counter medicines, read outside film or summarize x-ray)  Data  Imaging:   Previous imaging was negative for fracture  Encounter Diagnosis  Name Primary?  . Pain in left hip Yes    PLAN:   Resume normal activities follow-up as needed    Arther Abbott, MD 03/06/2019 9:21 AM

## 2019-04-07 DIAGNOSIS — I10 Essential (primary) hypertension: Secondary | ICD-10-CM | POA: Diagnosis not present

## 2019-04-14 DIAGNOSIS — Z23 Encounter for immunization: Secondary | ICD-10-CM | POA: Diagnosis not present

## 2019-04-14 DIAGNOSIS — E782 Mixed hyperlipidemia: Secondary | ICD-10-CM | POA: Diagnosis not present

## 2019-04-14 DIAGNOSIS — I10 Essential (primary) hypertension: Secondary | ICD-10-CM | POA: Diagnosis not present

## 2019-04-14 DIAGNOSIS — I2510 Atherosclerotic heart disease of native coronary artery without angina pectoris: Secondary | ICD-10-CM | POA: Diagnosis not present

## 2019-04-28 ENCOUNTER — Other Ambulatory Visit (HOSPITAL_COMMUNITY): Payer: BLUE CROSS/BLUE SHIELD

## 2019-05-05 ENCOUNTER — Ambulatory Visit (HOSPITAL_COMMUNITY): Payer: BLUE CROSS/BLUE SHIELD | Admitting: Nurse Practitioner

## 2019-07-07 DIAGNOSIS — I10 Essential (primary) hypertension: Secondary | ICD-10-CM | POA: Diagnosis not present

## 2019-07-07 DIAGNOSIS — E782 Mixed hyperlipidemia: Secondary | ICD-10-CM | POA: Diagnosis not present

## 2019-07-07 DIAGNOSIS — Z8572 Personal history of non-Hodgkin lymphomas: Secondary | ICD-10-CM | POA: Diagnosis not present

## 2019-08-24 ENCOUNTER — Other Ambulatory Visit (HOSPITAL_COMMUNITY): Payer: Self-pay | Admitting: *Deleted

## 2019-08-24 DIAGNOSIS — C8330 Diffuse large B-cell lymphoma, unspecified site: Secondary | ICD-10-CM

## 2019-08-25 ENCOUNTER — Other Ambulatory Visit: Payer: Self-pay

## 2019-08-25 ENCOUNTER — Inpatient Hospital Stay (HOSPITAL_COMMUNITY): Payer: Medicare Other | Attending: Hematology

## 2019-08-25 DIAGNOSIS — C8330 Diffuse large B-cell lymphoma, unspecified site: Secondary | ICD-10-CM

## 2019-08-25 DIAGNOSIS — C8335 Diffuse large B-cell lymphoma, lymph nodes of inguinal region and lower limb: Secondary | ICD-10-CM | POA: Insufficient documentation

## 2019-08-25 DIAGNOSIS — Z9221 Personal history of antineoplastic chemotherapy: Secondary | ICD-10-CM | POA: Diagnosis not present

## 2019-08-25 LAB — COMPREHENSIVE METABOLIC PANEL
ALT: 20 U/L (ref 0–44)
AST: 16 U/L (ref 15–41)
Albumin: 4.1 g/dL (ref 3.5–5.0)
Alkaline Phosphatase: 83 U/L (ref 38–126)
Anion gap: 10 (ref 5–15)
BUN: 10 mg/dL (ref 8–23)
CO2: 28 mmol/L (ref 22–32)
Calcium: 9.3 mg/dL (ref 8.9–10.3)
Chloride: 102 mmol/L (ref 98–111)
Creatinine, Ser: 0.84 mg/dL (ref 0.44–1.00)
GFR calc Af Amer: >60 mL/min (ref >60)
GFR calc non Af Amer: >60 mL/min (ref >60)
Glucose, Bld: 102 mg/dL — ABNORMAL HIGH (ref 70–99)
Potassium: 4.5 mmol/L (ref 3.5–5.1)
Sodium: 140 mmol/L (ref 135–145)
Total Bilirubin: 0.6 mg/dL (ref 0.3–1.2)
Total Protein: 6.6 g/dL (ref 6.5–8.1)

## 2019-08-25 LAB — LACTATE DEHYDROGENASE: LDH: 128 U/L (ref 98–192)

## 2019-08-28 ENCOUNTER — Other Ambulatory Visit (HOSPITAL_COMMUNITY): Payer: BC Managed Care – PPO

## 2019-09-04 ENCOUNTER — Ambulatory Visit (HOSPITAL_COMMUNITY): Payer: Self-pay | Admitting: Hematology

## 2019-09-05 DIAGNOSIS — Z1211 Encounter for screening for malignant neoplasm of colon: Secondary | ICD-10-CM | POA: Diagnosis not present

## 2019-09-06 ENCOUNTER — Inpatient Hospital Stay (HOSPITAL_COMMUNITY): Payer: Medicare Other | Attending: Hematology | Admitting: Nurse Practitioner

## 2019-09-06 ENCOUNTER — Other Ambulatory Visit: Payer: Self-pay

## 2019-09-06 DIAGNOSIS — Z9221 Personal history of antineoplastic chemotherapy: Secondary | ICD-10-CM | POA: Insufficient documentation

## 2019-09-06 DIAGNOSIS — K589 Irritable bowel syndrome without diarrhea: Secondary | ICD-10-CM | POA: Diagnosis not present

## 2019-09-06 DIAGNOSIS — E785 Hyperlipidemia, unspecified: Secondary | ICD-10-CM | POA: Insufficient documentation

## 2019-09-06 DIAGNOSIS — C8330 Diffuse large B-cell lymphoma, unspecified site: Secondary | ICD-10-CM | POA: Diagnosis not present

## 2019-09-06 DIAGNOSIS — G629 Polyneuropathy, unspecified: Secondary | ICD-10-CM | POA: Diagnosis not present

## 2019-09-06 DIAGNOSIS — Z8349 Family history of other endocrine, nutritional and metabolic diseases: Secondary | ICD-10-CM | POA: Insufficient documentation

## 2019-09-06 DIAGNOSIS — F17210 Nicotine dependence, cigarettes, uncomplicated: Secondary | ICD-10-CM | POA: Insufficient documentation

## 2019-09-06 DIAGNOSIS — I10 Essential (primary) hypertension: Secondary | ICD-10-CM | POA: Diagnosis not present

## 2019-09-06 DIAGNOSIS — C8335 Diffuse large B-cell lymphoma, lymph nodes of inguinal region and lower limb: Secondary | ICD-10-CM | POA: Diagnosis not present

## 2019-09-06 DIAGNOSIS — F419 Anxiety disorder, unspecified: Secondary | ICD-10-CM | POA: Diagnosis not present

## 2019-09-06 DIAGNOSIS — Z806 Family history of leukemia: Secondary | ICD-10-CM | POA: Insufficient documentation

## 2019-09-06 DIAGNOSIS — Z791 Long term (current) use of non-steroidal anti-inflammatories (NSAID): Secondary | ICD-10-CM | POA: Diagnosis not present

## 2019-09-06 DIAGNOSIS — I252 Old myocardial infarction: Secondary | ICD-10-CM | POA: Diagnosis not present

## 2019-09-06 DIAGNOSIS — Z800 Family history of malignant neoplasm of digestive organs: Secondary | ICD-10-CM | POA: Diagnosis not present

## 2019-09-06 DIAGNOSIS — Z79899 Other long term (current) drug therapy: Secondary | ICD-10-CM | POA: Diagnosis not present

## 2019-09-06 DIAGNOSIS — Z8249 Family history of ischemic heart disease and other diseases of the circulatory system: Secondary | ICD-10-CM | POA: Diagnosis not present

## 2019-09-06 NOTE — Patient Instructions (Signed)
[facility] at [facility] Discharge Instructions  Follow up in 6 months with labs    Thank you for choosing [facility] at [facility] to provide your oncology and hematology care.  To afford each patient quality time with our provider, please arrive at least 15 minutes before your scheduled appointment time.   If you have a lab appointment with the Cancer Center please come in thru the Main Entrance and check in at the main information desk.  You need to re-schedule your appointment should you arrive 10 or more minutes late.  We strive to give you quality time with our providers, and arriving late affects you and other patients whose appointments are after yours.  Also, if you no show three or more times for appointments you may be dismissed from the clinic at the providers discretion.     Again, thank you for choosing [facility].  Our hope is that these requests will decrease the amount of time that you wait before being seen by our physicians.       _____________________________________________________________  Should you have questions after your visit to [facility], please contact our office at [phone] between the hours of 8:00 a.m. and 4:30 p.m.  Voicemails left after 4:00 p.m. will not be returned until the following business day.  For prescription refill requests, have your pharmacy contact our office and allow 72 hours.    Due to Covid, you will need to wear a mask upon entering the hospital. If you do not have a mask, a mask will be given to you at the Main Entrance upon arrival. For doctor visits, patients may have 1 support person with them. For treatment visits, patients can not have anyone with them due to social distancing guidelines and our immunocompromised population.      

## 2019-09-06 NOTE — Progress Notes (Signed)
[place], [place] [address]   CLINIC:  Medical Oncology/Hematology  PCP:  Celene Squibb, MD [place] [address] [phone]   REASON FOR VISIT: Follow-up for lymphoma  CURRENT THERAPY: Clinical surveillance  BRIEF ONCOLOGIC HISTORY:  Oncology History  Lymphoma, large-cell, diffuse ([facility])  07/13/2016 Initial Biopsy   L inguinal biopsy with Dr. 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 Grade (if applicable): High grade. Flow cytometry: [id number] reveals a monoclonal B-cell population with expression of CD10. Immunohistochemical stains: CD20, CD3, CD10, bcl-2, bcl-6, CD21, CD23, Ki-67. Touch preps/imprints: Mixture of large and smaller lymphocytes with irregular nuclear contours. Comments: Sections of lymph node reveal effacement of the architecture by a nodular proliferation of neoplastic appearing follicles. In many of the areas the follicles coalesce and form sheets of large cells. The more formed follicles predominately consist of numerous large centroblasts (>15 per hpf), representing a high grade (3B) follicular component. The diffuse areas are also composed of large cells but lack follicular dendritic networks (CD21, CD23). Immunohistochemistry reveals the atypical lymphocytes are positive for CD20, CD10, and bcl-6. They are negative for bcl-2. Ki-67 is elevated (30% overall). CD3 reveals residual surrounding T-cells. Flow cytometry ([id number]) reveals a monoclonal B-cell population with expression of CD10. 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 The case was called to Dr. Rosana Hoes on 07/16/2016.   07/23/2016 Echocardiogram   Left  ventricle: Systolic function was normal. The estimated ejection fraction was in the range of 50% to 55%. Doppler parameters are consistent with abnormal left ventricular relaxation (grade 1 diastolic dysfunction).   07/24/2016 Procedure   Technically successful CT guided right iliac bone core and aspiration biopsy by IR.   07/28/2016 PET scan   1. Hypermetabolic lymphadenopathy in the neck, abdomen and right inguinal area as described above, consistent with known lymphoma. 2. Focal area of hypermetabolism in the appendix could be lymphomatous involvement. Recommend attention on follow-up scans. 3. Age advanced atherosclerotic calcifications involving the abdominal aorta and branch vessels and age advanced three-vessel coronary artery calcifications. 4. No obvious osseous involvement. 5. Emphysematous changes and pulmonary scarring.   07/28/2016 Pathology Results   Bone Marrow, Aspirate,Biopsy, and Clot, right iliac - NORMOCELLULAR BONE MARROW WITH TRILINEAGE HEMATOPOIESIS. PERIPHERAL BLOOD: - OCCASIONAL CIRCULATING ATYPICAL LYMPHOID CELLS.   07/28/2016 Pathology Results   Bone Marrow Flow Cytometry - NO MONOCLONAL B CELL POPULATION OR ABNORMAL T CELL PHENOTYPE IDENTIFIED.   07/30/2016 Pathology Results   Peripheral Blood Flow Cytometry - NO MONOCLONAL B-CELL POPULATION OR ABNORMAL T-CELL PHENOTYPE IDENTIFIED.   07/30/2016 -  Chemotherapy   The patient had DOXOrubicin (ADRIAMYCIN) chemo injection 78 mg, 50 mg/m2 = 78 mg, Intravenous,  Once, 2 of 6 cycles  palonosetron (ALOXI) injection 0.25 mg, 0.25 mg, Intravenous,  Once, 2 of 6 cycles  pegfilgrastim (NEULASTA ONPRO KIT) injection 6 mg, 6 mg, Subcutaneous, Once, 2 of 6 cycles  vinCRIStine (ONCOVIN) 2 mg in sodium chloride 0.9 % 50 mL chemo infusion, 2 mg, Intravenous,  Once, 2 of 6 cycles  riTUXimab (RITUXAN) 600 mg in sodium chloride 0.9 % 250 mL (1.9355 mg/mL) chemo infusion, 375 mg/m2 = 600 mg, Intravenous,  Once, 2 of 6  cycles  cyclophosphamide (CYTOXAN) 1,180 mg in sodium  chloride 0.9 % 250 mL chemo infusion, 750 mg/m2 = 1,180 mg, Intravenous,  Once, 2 of 6 cycles  for chemotherapy treatment.     08/03/2016 Pathology Results   Cytogenetic lab results [facility]). Cytogenetic Analysis: Normal.    09/23/2016 PET scan   1. Significantly improved appearance, with resolution of prior hypermetabolic activity in all of the previously involved lymph nodes. All lymph nodes are currently within normal size limits. 2. Other imaging findings of potential clinical significance: Chronic right sphenoid sinusitis. Right mastoid effusion. Coronary, aortic arch, and branch vessel atherosclerotic vascular disease. Severe emphysema. Aortoiliac atherosclerotic vascular disease.   11/12/2016 -  Chemotherapy   Completed cycle 6 of RCHOP    12/11/2016 PET scan   1. No evidence of residual hypermetabolic lymphoma or adenopathy. 2. Age advanced coronary artery atherosclerosis. Recommend assessment of coronary risk factors and consideration of medical therapy. 3. Centrilobular emphysema.   06/18/2017 PET scan   IMPRESSION: 1. No new or recurrent hypermetabolic adenopathy in the neck, chest, abdomen, or pelvis. 2. There is potentially some mild wall thickening along a 6 cm segment of the distal ileum. Scattered accentuated metabolic activity in the bowel, likely physiologic. There is some fat deposition in the proximal colon wall, which is typically considered incidental although there is a weak association with inflammatory bowel disease. 3. Aortic Atherosclerosis (ICD10-I70.0) and Emphysema (ICD10-J43.9). Coronary atherosclerosis.      CANCER STAGING: Cancer Staging Lymphoma, large-cell, diffuse ([facility]) Staging form: Hodgkin and Non-Hodgkin Lymphoma, AJCC 8th Edition - Clinical stage from 07/31/2016: Stage III (Diffuse large B-cell lymphoma) - Signed by Baird Cancer, PA-C on 07/31/2016    INTERVAL HISTORY:  Ms.  Cratty 64 y.o. female returns for routine follow-up for lymphoma.  Patient reports she is doing well since her last visit.  Patient denies any B symptoms.  Patient denies any easy bruising or bleeding.  She denies any extreme fatigue. Denies any nausea, vomiting, or diarrhea. Denies any new pains. Had not noticed any recent bleeding such as epistaxis, hematuria or hematochezia. Denies recent chest pain on exertion, shortness of breath on minimal exertion, pre-syncopal episodes, or palpitations. Denies any numbness or tingling in hands or feet. Denies any recent fevers, infections, or recent hospitalizations. Patient reports appetite at 100% and energy level at 75%.  She is eating well maintain her weight at this time.    REVIEW OF SYSTEMS:  Review of Systems  Respiratory: Positive for cough (Smoker) and shortness of breath (With exertion).   Neurological: Positive for headaches and numbness.  Psychiatric/Behavioral: Positive for depression. The patient is nervous/anxious.   All other systems reviewed and are negative.    PAST MEDICAL/SURGICAL HISTORY:  Past Medical History:  Diagnosis Date  . Anxiety   . Arthritis   . DLBCL (diffuse large B cell lymphoma) ([facility]) 07/16/2016  . GERD (gastroesophageal reflux disease)   . Heart disease   . Hyperlipidemia   . Hypertension   . Irritable bowel syndrome (IBS)    with diarrhea  . Myocardial infarction ([facility]) 12/19/2009   released from cardiology   Past Surgical History:  Procedure Laterality Date  . CARDIAC CATHETERIZATION     cardiac stent  . CHOLECYSTECTOMY    . CORONARY STENT PLACEMENT  2011  . INGUINAL LYMPH NODE BIOPSY Left 07/13/2016   Procedure: EXCISIONAL BIOPSY OF LEFT INGUINAL LYMPH NODE;  Surgeon: Vickie Epley, MD;  Location: [facility];  Service: General;  Laterality: Left;  . PORT-A-CATH REMOVAL Right 05/20/2018   Procedure: MINOR REMOVAL  PORT-A-CATH;  Surgeon: Aviva Signs, MD;  Location: [facility];  Service: General;   Laterality: Right;  . PORTACATH PLACEMENT N/A 07/17/2016   Procedure: INSERTION OF TUNNELED CENTRAL VENOUS CATHETER WITH SUBCUTANEOUS PORT;  Surgeon: Vickie Epley, MD;  Location: [facility];  Service: Vascular;  Laterality: N/A;  . TUBAL LIGATION       SOCIAL HISTORY:  Social History   Socioeconomic History  . Marital status: Single    Spouse name: Not on file  . Number of children: Not on file  . Years of education: Not on file  . Highest education level: Not on file  Occupational History  . Not on file  Tobacco Use  . Smoking status: Current Every Day Smoker    Packs/day: 0.50    Years: 40.00    Pack years: 20.00    Types: Cigarettes  . Smokeless tobacco: Never Used  Substance and Sexual Activity  . Alcohol use: No  . Drug use: No  . Sexual activity: Yes    Birth control/protection: Post-menopausal  Other Topics Concern  . Not on file  Social History Narrative  . Not on file   Social Determinants of Health   Financial Resource Strain:   . Difficulty of Paying Living Expenses: Not on file  Food Insecurity:   . Worried About Charity fundraiser in the Last Year: Not on file  . Ran Out of Food in the Last Year: Not on file  Transportation Needs:   . Lack of Transportation (Medical): Not on file  . Lack of Transportation (Non-Medical): Not on file  Physical Activity:   . Days of Exercise per Week: Not on file  . Minutes of Exercise per Session: Not on file  Stress:   . Feeling of Stress : Not on file  Social Connections:   . Frequency of Communication with Friends and Family: Not on file  . Frequency of Social Gatherings with Friends and Family: Not on file  . Attends Religious Services: Not on file  . Active Member of Clubs or Organizations: Not on file  . Attends Archivist Meetings: Not on file  . Marital Status: Not on file  Intimate Partner Violence:   . Fear of Current or Ex-Partner: Not on file  . Emotionally Abused: Not on file  . Physically  Abused: Not on file  . Sexually Abused: Not on file    FAMILY HISTORY:  Family History  Problem Relation Age of Onset  . Cancer Mother        colon  . Depression Mother   . Heart disease Father   . Depression Sister   . Cancer Sister        leukemia  . Hyperlipidemia Brother   . Hypertension Brother     CURRENT MEDICATIONS:  Outpatient Encounter Medications as of 09/06/2019  Medication Sig  . buPROPion (WELLBUTRIN XL) 150 MG 24 hr tablet Take 150 mg by mouth daily.  Marland Kitchen gabapentin (NEURONTIN) 100 MG capsule TAKE 1 CAPSULE BY MOUTH 2 TIMES A DAY.  . pantoprazole (PROTONIX) 40 MG tablet Take 40 mg by mouth daily. Pt takes at supper daily  . simvastatin (ZOCOR) 20 MG tablet Take 20 mg by mouth at bedtime.  . [DISCONTINUED] meloxicam (MOBIC) 15 MG tablet Take 1 tablet (15 mg total) by mouth daily.  . [DISCONTINUED] simvastatin (ZOCOR) 40 MG tablet Take 40 mg by mouth daily.  Marland Kitchen acetaminophen (TYLENOL) 500 MG tablet Take 500-1,000 mg by mouth every 6 (six)  hours as needed for mild pain or moderate pain.  Marland Kitchen albuterol (VENTOLIN HFA) 108 (90 Base) MCG/ACT inhaler SMARTSIG:1-2 Puff(s) By Mouth Every 4-6 Hours PRN  . ALPRAZolam (XANAX) 0.5 MG tablet Take 0.25-0.5 mg by mouth daily as needed for anxiety.   . [DISCONTINUED] methocarbamol (ROBAXIN) 500 MG tablet    No facility-administered encounter medications on file as of 09/06/2019.    ALLERGIES:  Allergies  Allergen Reactions  . Amoxicillin Nausea And Vomiting    Yeast Infections  Has patient had a PCN reaction causing immediate rash, facial/tongue/throat swelling, SOB or lightheadedness with hypotension: No Has patient had a PCN reaction causing severe rash involving mucus membranes or skin necrosis: No Has patient had a PCN reaction that required hospitalization No Has patient had a PCN reaction occurring within the last 10 years: No If all of the above answers are "NO", then may proceed with Cephalosporin use.   Marland Kitchen Choline  Fenofibrate     REACTION: Abd pain and elevated Lipase  . Penicillins     Yeast infections, thrush  Has patient had a PCN reaction causing immediate rash, facial/tongue/throat swelling, SOB or lightheadedness with hypotension: No Has patient had a PCN reaction causing severe rash involving mucus membranes or skin necrosis: No Has patient had a PCN reaction that required hospitalization No Has patient had a PCN reaction occurring within the last 10 years: No If all of the above answers are "NO", then may proceed with Cephalosporin use.   Marland Kitchen Percocet [Oxycodone-Acetaminophen] Nausea And Vomiting     PHYSICAL EXAM:  ECOG Performance status: 1  Vitals:   09/06/19 0932  BP: (!) 163/71  Pulse: 99  Resp: 18  Temp: (!) 97.1 F (36.2 C)  SpO2: 99%   There were no vitals filed for this visit.  Physical Exam Constitutional:      Appearance: Normal appearance. She is normal weight.  Cardiovascular:     Rate and Rhythm: Normal rate and regular rhythm.     Heart sounds: Normal heart sounds.  Pulmonary:     Effort: Pulmonary effort is normal.     Breath sounds: Normal breath sounds.  Abdominal:     General: Bowel sounds are normal.     Palpations: Abdomen is soft.  Musculoskeletal:        General: Normal range of motion.     Cervical back: Normal range of motion and neck supple.  Skin:    General: Skin is warm.  Neurological:     Mental Status: She is alert and oriented to person, place, and time. Mental status is at baseline.  Psychiatric:        Mood and Affect: Mood normal.        Behavior: Behavior normal.        Thought Content: Thought content normal.        Judgment: Judgment normal.      LABORATORY DATA:  I have reviewed the labs as listed.  CBC    Component Value Date/Time   WBC 5.7 02/20/2019 1337   RBC 4.81 02/20/2019 1337   HGB 15.2 (H) 02/20/2019 1337   HCT 46.7 (H) 02/20/2019 1337   PLT 310 02/20/2019 1337   MCV 97.1 02/20/2019 1337   MCH 31.6  02/20/2019 1337   MCHC 32.5 02/20/2019 1337   RDW 13.2 02/20/2019 1337   LYMPHSABS 1.5 02/20/2019 1337   MONOABS 0.5 02/20/2019 1337   EOSABS 0.1 02/20/2019 1337   BASOSABS 0.1 02/20/2019 1337   CMP Latest Ref  Rng & Units 08/25/2019 02/20/2019 10/27/2018  Glucose 70 - 99 mg/dL 102(H) 113(H) 117(H)  BUN 8 - 23 mg/dL 10 9 7(L)  Creatinine 0.44 - 1.00 mg/dL 0.84 0.79 0.80  Sodium 135 - 145 mmol/L 140 140 139  Potassium 3.5 - 5.1 mmol/L 4.5 3.7 3.5  Chloride 98 - 111 mmol/L 102 105 102  CO2 22 - 32 mmol/L _0 Calcium 8.9 - 10.3 mg/dL 9.3 9.5 9.2  Total Protein 6.5 - 8.1 g/dL 6.6 6.9 7.1  Total Bilirubin 0.3 - 1.2 mg/dL 0.6 0.6 0.6  Alkaline Phos 38 - 126 U/L 83 93 96  AST 15 - 41 U/L _1 ALT 0 - 44 U/L _2 I personally performed a face-to-face visit.  All questions were answered to patient's stated satisfaction. Encouraged patient to call with any new concerns or questions before his next visit to the cancer center and we can certain see him sooner, if needed.     ASSESSMENT & PLAN:   Lymphoma, large-cell, diffuse ([facility]) 1.  Stage IIIb large B-cell lymphoma: -Presentation with weight loss, night sweats, adenopathy in the neck, abdomen and right inguinal area. -6 cycles of R-CHOP from 07/30/2016 through 11/12/2016. -Denies fevers, night sweats or weight loss.  Denies any recurrent infections or hospitalizations. -CT CAP on 06/21/2018 did not show any evidence of adenopathy in the chest, abdomen and pelvis.  Innumerable new groundglass nodules in both lungs. -She is a current active smoker. -CT of the chest with contrast dated 10/27/2018 which did not show any thoracic adenopathy to suggest recurrent or residual lymphoma.  Resolution of the majority of the previously described groundglass nodules.  A left upper lobe 6 mm groundglass nodule persist. -CT scan follow-up in 2 years was recommended. -Today's physical examination did not reveal any palpable adenopathy. -She will  return to clinic in 6 months with labs.  2.  Left femur pain: -Developed left femur pain in June secondary to injury. -Plain films performed on 02/10/2019 did not reveal any acute findings.  MRI recommended but unable to do due to claustrophobia. -CT of femur on 02/20/2019 revealed no acute osseous abnormality.  Revealed left hip osteoarthritis as well as left knee medial compartment osteoarthritis.  Also of note CT showed diffuse bowel wall thickening of sigmoid colon which was again noted from the previous scan in 2019.  Patient was told to follow-up with GI for colonoscopy.  Patient refused colonoscopy. -Pain has somewhat resolved.  3.  Left colon thickening: -Her last colonoscopy was in 1999. -CT scan showed thickening of the sigmoid colon area.  The current CT scan on 06/21/2018 showed improved appearance of the colon since 12/21/2017. -Again consult with GI for colonoscopy was recommended -Patient refuses colonoscopy.  Patient reports she denies any bright red bleeding per rectum or melena. 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  4.  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 -We will refill her prescription for gabapentin       Orders placed this encounter:  Orders Placed This Encounter  Procedures  . Lactate dehydrogenase  . CBC with Differential/Platelet  . Comprehensive metabolic panel  . Vitamin B12  . VITAMIN D 25 Hydroxy (Vit-D Deficiency, Fractures)      Wenda Low, FNP-C [place] [phone]

## 2019-09-06 NOTE — Assessment & Plan Note (Addendum)
1.  Stage IIIb large B-cell lymphoma: -Presentation with weight loss, night sweats, adenopathy in the neck, abdomen and right inguinal area. -6 cycles of R-CHOP from 07/30/2016 through 11/12/2016. -Denies fevers, night sweats or weight loss.  Denies any recurrent infections or hospitalizations. -CT CAP on 06/21/2018 did not show any evidence of adenopathy in the chest, abdomen and pelvis.  Innumerable new groundglass nodules in both lungs. -She is a current active smoker. -CT of the chest with contrast dated 10/27/2018 which did not show any thoracic adenopathy to suggest recurrent or residual lymphoma.  Resolution of the majority of the previously described groundglass nodules.  A left upper lobe 6 mm groundglass nodule persist. -CT scan follow-up in 2 years was recommended. -Today's physical examination did not reveal any palpable adenopathy. -She will return to clinic in 6 months with labs.  2.  Left femur pain: -Developed left femur pain in June secondary to injury. -Plain films performed on 02/10/2019 did not reveal any acute findings.  MRI recommended but unable to do due to claustrophobia. -CT of femur on 02/20/2019 revealed no acute osseous abnormality.  Revealed left hip osteoarthritis as well as left knee medial compartment osteoarthritis.  Also of note CT showed diffuse bowel wall thickening of sigmoid colon which was again noted from the previous scan in 2019.  Patient was told to follow-up with GI for colonoscopy.  Patient refused colonoscopy. -Pain has somewhat resolved.  3.  Left colon thickening: -Her last colonoscopy was in 1999. -CT scan showed thickening of the sigmoid colon area.  The current CT scan on 06/21/2018 showed improved appearance of the colon since 12/21/2017. -Again consult with GI for colonoscopy was recommended -Patient refuses colonoscopy.  Patient reports she denies any bright red bleeding per rectum or melena. -Patient reports she is having IBS-diarrhea -Patient  reports she has a family history of colon cancer her mother died from colon cancer.  4.  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 -We will refill her prescription for gabapentin

## 2019-09-10 LAB — COLOGUARD: COLOGUARD: NEGATIVE

## 2019-11-20 DIAGNOSIS — M7711 Lateral epicondylitis, right elbow: Secondary | ICD-10-CM | POA: Diagnosis not present

## 2019-12-01 DIAGNOSIS — E782 Mixed hyperlipidemia: Secondary | ICD-10-CM | POA: Diagnosis not present

## 2019-12-01 DIAGNOSIS — E559 Vitamin D deficiency, unspecified: Secondary | ICD-10-CM | POA: Diagnosis not present

## 2019-12-01 DIAGNOSIS — M545 Low back pain: Secondary | ICD-10-CM | POA: Diagnosis not present

## 2019-12-08 DIAGNOSIS — G620 Drug-induced polyneuropathy: Secondary | ICD-10-CM | POA: Diagnosis not present

## 2019-12-08 DIAGNOSIS — Z0001 Encounter for general adult medical examination with abnormal findings: Secondary | ICD-10-CM | POA: Diagnosis not present

## 2019-12-11 ENCOUNTER — Other Ambulatory Visit: Payer: Self-pay

## 2019-12-11 ENCOUNTER — Ambulatory Visit: Payer: Medicare Other

## 2019-12-11 ENCOUNTER — Ambulatory Visit (INDEPENDENT_AMBULATORY_CARE_PROVIDER_SITE_OTHER): Payer: Medicare Other | Admitting: Orthopedic Surgery

## 2019-12-11 ENCOUNTER — Encounter: Payer: Self-pay | Admitting: Orthopedic Surgery

## 2019-12-11 VITALS — BP 154/83 | HR 94 | Ht 61.5 in | Wt 116.0 lb

## 2019-12-11 DIAGNOSIS — M25521 Pain in right elbow: Secondary | ICD-10-CM

## 2019-12-11 DIAGNOSIS — M7711 Lateral epicondylitis, right elbow: Secondary | ICD-10-CM | POA: Diagnosis not present

## 2019-12-11 NOTE — Patient Instructions (Signed)
Use Aspercreme, Biofreeze or Voltaren gel over the counter 2-3 times daily make sure you rub it in well each time you use it.   Rub your elbow with ice at least 3 -4 times daily for 30 minutes at a time

## 2019-12-11 NOTE — Progress Notes (Signed)
NEW PROBLEM//OFFICE VISIT  Chief Complaint  Patient presents with  . Elbow Pain    right/ since lifting dresser 11/18/19 lateral     64 year old female presents with 3-week history of pain lateral aspect right elbow after doing some moving including a dresser was treated with tramadol prednisone did not help comes in with lateral elbow pain   Review of Systems  Musculoskeletal: Positive for back pain.  All other systems reviewed and are negative.    Past Medical History:  Diagnosis Date  . Anxiety   . Arthritis   . DLBCL (diffuse large B cell lymphoma) ([place]) 07/16/2016  . GERD (gastroesophageal reflux disease)   . Heart disease   . Hyperlipidemia   . Hypertension   . Irritable bowel syndrome (IBS)    with diarrhea  . Myocardial infarction ([place]) 12/19/2009   released from cardiology    Past Surgical History:  Procedure Laterality Date  . CARDIAC CATHETERIZATION     cardiac stent  . CHOLECYSTECTOMY    . CORONARY STENT PLACEMENT  2011  . INGUINAL LYMPH NODE BIOPSY Left 07/13/2016   Procedure: EXCISIONAL BIOPSY OF LEFT INGUINAL LYMPH NODE;  Surgeon: Vickie Epley, MD;  Location: [facility];  Service: General;  Laterality: Left;  . PORT-A-CATH REMOVAL Right 05/20/2018   Procedure: MINOR REMOVAL PORT-A-CATH;  Surgeon: Aviva Signs, MD;  Location: [facility];  Service: General;  Laterality: Right;  . PORTACATH PLACEMENT N/A 07/17/2016   Procedure: INSERTION OF TUNNELED CENTRAL VENOUS CATHETER WITH SUBCUTANEOUS PORT;  Surgeon: Vickie Epley, MD;  Location: [facility];  Service: Vascular;  Laterality: N/A;  . TUBAL LIGATION      Family History  Problem Relation Age of Onset  . Cancer Mother        colon  . Depression Mother   . Heart disease Father   . Depression Sister   . Cancer Sister        leukemia  . Hyperlipidemia Brother   . Hypertension Brother    Social History   Tobacco Use  . Smoking status: Current Every Day Smoker    Packs/day: 0.50    Years: 40.00   Pack years: 20.00    Types: Cigarettes  . Smokeless tobacco: Never Used  Substance Use Topics  . Alcohol use: No  . Drug use: No    Allergies  Allergen Reactions  . Amoxicillin Nausea And Vomiting    Yeast Infections  Has patient had a PCN reaction causing immediate rash, facial/tongue/throat swelling, SOB or lightheadedness with hypotension: No Has patient had a PCN reaction causing severe rash involving mucus membranes or skin necrosis: No Has patient had a PCN reaction that required hospitalization No Has patient had a PCN reaction occurring within the last 10 years: No If all of the above answers are "NO", then may proceed with Cephalosporin use.   Marland Kitchen Choline Fenofibrate     REACTION: Abd pain and elevated Lipase  . Penicillins     Yeast infections, thrush  Has patient had a PCN reaction causing immediate rash, facial/tongue/throat swelling, SOB or lightheadedness with hypotension: No Has patient had a PCN reaction causing severe rash involving mucus membranes or skin necrosis: No Has patient had a PCN reaction that required hospitalization No Has patient had a PCN reaction occurring within the last 10 years: No If all of the above answers are "NO", then may proceed with Cephalosporin use.   Marland Kitchen Percocet [Oxycodone-Acetaminophen] Nausea And Vomiting    Current Meds  Medication Sig  .  acetaminophen (TYLENOL) 500 MG tablet Take 500-1,000 mg by mouth every 6 (six) hours as needed for mild pain or moderate pain.  Marland Kitchen albuterol (VENTOLIN HFA) 108 (90 Base) MCG/ACT inhaler SMARTSIG:1-2 Puff(s) By Mouth Every 4-6 Hours PRN  . ALPRAZolam (XANAX) 0.5 MG tablet Take 0.25-0.5 mg by mouth daily as needed for anxiety.   Marland Kitchen buPROPion (WELLBUTRIN XL) 150 MG 24 hr tablet Take 150 mg by mouth daily.  . fluticasone (FLONASE) 50 MCG/ACT nasal spray Place into both nostrils daily.  Marland Kitchen gabapentin (NEURONTIN) 100 MG capsule TAKE 1 CAPSULE BY MOUTH 2 TIMES A DAY.  . pantoprazole (PROTONIX) 40 MG tablet  Take 40 mg by mouth daily. Pt takes at supper daily  . simvastatin (ZOCOR) 20 MG tablet Take 20 mg by mouth at bedtime.    BP (!) 154/83   Pulse 94   Ht 5' 1.5" (1.562 m)   Wt 116 lb (52.6 kg)   BMI 21.56 kg/m   Physical Exam Constitutional:      General: She is not in acute distress.    Appearance: She is well-developed.  Cardiovascular:     Comments: No peripheral edema Skin:    General: Skin is warm and dry.  Neurological:     Mental Status: She is alert and oriented to person, place, and time.     Sensory: No sensory deficit.     Coordination: Coordination normal.     Gait: Gait normal.     Deep Tendon Reflexes: Reflexes are normal and symmetric.     Ortho Exam  Full range of motion of the elbow no instability does have pain with extension tenderness over the lateral epicondyle with mild swelling pain with wrist extension and long finger extension  Neurovascular exam is intact  MEDICAL DECISION MAKING  A.  Encounter Diagnosis  Name Primary?  . Pain in right elbow Yes    B. DATA ANALYSED:  IMAGING:  Office images show normal elbow AP and lateral  Orders:   Outside records reviewed: no  C. MANAGEMENT  Ice Topical Voltaren gel Home exercises Brace Patient did not want a shot  Follow-up in 8 weeks  No orders of the defined types were placed in this encounter.   Acute uncomplicated in office x-ray low risk  Arther Abbott, MD  12/11/2019 9:43 AM

## 2020-01-16 ENCOUNTER — Other Ambulatory Visit: Payer: Self-pay

## 2020-01-16 ENCOUNTER — Encounter (HOSPITAL_COMMUNITY): Payer: Self-pay | Admitting: Nurse Practitioner

## 2020-01-16 ENCOUNTER — Inpatient Hospital Stay (HOSPITAL_COMMUNITY): Payer: Medicare Other | Attending: Nurse Practitioner | Admitting: Nurse Practitioner

## 2020-01-16 DIAGNOSIS — Z79899 Other long term (current) drug therapy: Secondary | ICD-10-CM | POA: Insufficient documentation

## 2020-01-16 DIAGNOSIS — I252 Old myocardial infarction: Secondary | ICD-10-CM | POA: Diagnosis not present

## 2020-01-16 DIAGNOSIS — R61 Generalized hyperhidrosis: Secondary | ICD-10-CM | POA: Insufficient documentation

## 2020-01-16 DIAGNOSIS — K589 Irritable bowel syndrome without diarrhea: Secondary | ICD-10-CM | POA: Diagnosis not present

## 2020-01-16 DIAGNOSIS — R590 Localized enlarged lymph nodes: Secondary | ICD-10-CM | POA: Insufficient documentation

## 2020-01-16 DIAGNOSIS — Z9221 Personal history of antineoplastic chemotherapy: Secondary | ICD-10-CM | POA: Insufficient documentation

## 2020-01-16 DIAGNOSIS — I10 Essential (primary) hypertension: Secondary | ICD-10-CM | POA: Diagnosis not present

## 2020-01-16 DIAGNOSIS — F17210 Nicotine dependence, cigarettes, uncomplicated: Secondary | ICD-10-CM | POA: Diagnosis not present

## 2020-01-16 DIAGNOSIS — K219 Gastro-esophageal reflux disease without esophagitis: Secondary | ICD-10-CM | POA: Diagnosis not present

## 2020-01-16 DIAGNOSIS — F419 Anxiety disorder, unspecified: Secondary | ICD-10-CM | POA: Diagnosis not present

## 2020-01-16 DIAGNOSIS — I2510 Atherosclerotic heart disease of native coronary artery without angina pectoris: Secondary | ICD-10-CM | POA: Diagnosis not present

## 2020-01-16 DIAGNOSIS — R634 Abnormal weight loss: Secondary | ICD-10-CM | POA: Diagnosis not present

## 2020-01-16 DIAGNOSIS — M1990 Unspecified osteoarthritis, unspecified site: Secondary | ICD-10-CM | POA: Diagnosis not present

## 2020-01-16 DIAGNOSIS — C8330 Diffuse large B-cell lymphoma, unspecified site: Secondary | ICD-10-CM | POA: Diagnosis not present

## 2020-01-16 DIAGNOSIS — T451X5A Adverse effect of antineoplastic and immunosuppressive drugs, initial encounter: Secondary | ICD-10-CM | POA: Insufficient documentation

## 2020-01-16 DIAGNOSIS — E785 Hyperlipidemia, unspecified: Secondary | ICD-10-CM | POA: Diagnosis not present

## 2020-01-16 DIAGNOSIS — G620 Drug-induced polyneuropathy: Secondary | ICD-10-CM | POA: Insufficient documentation

## 2020-01-16 NOTE — Progress Notes (Signed)
**Note Gabriella-Identified via Obfuscation** Gabriella Love, [place] [id number]   CLINIC:  Medical Oncology/Hematology  PCP:  Celene Squibb, MD [place] [address] [phone]   REASON FOR VISIT: Follow-up for large B-cell lymphoma   CURRENT THERAPY: Clinical surveillance  BRIEF ONCOLOGIC HISTORY:  Oncology History  Lymphoma, large-cell, diffuse ([place])  07/13/2016 Initial Biopsy   L inguinal biopsy with Dr. 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 Grade (if applicable): High grade. Flow cytometry: [id number] reveals a monoclonal B-cell population with expression of CD10. Immunohistochemical stains: CD20, CD3, CD10, bcl-2, bcl-6, CD21, CD23, Ki-67. Touch preps/imprints: Mixture of large and smaller lymphocytes with irregular nuclear contours. Comments: Sections of lymph node reveal effacement of the architecture by a nodular proliferation of neoplastic appearing follicles. In many of the areas the follicles coalesce and form sheets of large cells. The more formed follicles predominately consist of numerous large centroblasts (>15 per hpf), representing a high grade (3B) follicular component. The diffuse areas are also composed of large cells but lack follicular dendritic networks (CD21, CD23). Immunohistochemistry reveals the atypical lymphocytes are positive for CD20, CD10, and bcl-6. They are negative for bcl-2. Ki-67 is elevated (30% overall). CD3 reveals residual surrounding T-cells. Flow cytometry ([id number]) reveals a monoclonal B-cell population with expression of CD10. 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 The case was called to Dr. Rosana Hoes on 07/16/2016.   07/23/2016 Echocardiogram    Left ventricle: Systolic function was normal. The estimated ejection fraction was in the range of 50% to 55%. Doppler parameters are consistent with abnormal left ventricular relaxation (grade 1 diastolic dysfunction).   07/24/2016 Procedure   Technically successful CT guided right iliac bone core and aspiration biopsy by IR.   07/28/2016 PET scan   1. Hypermetabolic lymphadenopathy in the neck, abdomen and right inguinal area as described above, consistent with known lymphoma. 2. Focal area of hypermetabolism in the appendix could be lymphomatous involvement. Recommend attention on follow-up scans. 3. Age advanced atherosclerotic calcifications involving the abdominal aorta and branch vessels and age advanced three-vessel coronary artery calcifications. 4. No obvious osseous involvement. 5. Emphysematous changes and pulmonary scarring.   07/28/2016 Pathology Results   Bone Marrow, Aspirate,Biopsy, and Clot, right iliac - NORMOCELLULAR BONE MARROW WITH TRILINEAGE HEMATOPOIESIS. PERIPHERAL BLOOD: - OCCASIONAL CIRCULATING ATYPICAL LYMPHOID CELLS.   07/28/2016 Pathology Results   Bone Marrow Flow Cytometry - NO MONOCLONAL B CELL POPULATION OR ABNORMAL T CELL PHENOTYPE IDENTIFIED.   07/30/2016 Pathology Results   Peripheral Blood Flow Cytometry - NO MONOCLONAL B-CELL POPULATION OR ABNORMAL T-CELL PHENOTYPE IDENTIFIED.   07/30/2016 -  Chemotherapy   The patient had DOXOrubicin (ADRIAMYCIN) chemo injection 78 mg, 50 mg/m2 = 78 mg, Intravenous,  Once, 2 of 6 cycles  palonosetron (ALOXI) injection 0.25 mg, 0.25 mg, Intravenous,  Once, 2 of 6 cycles  pegfilgrastim (NEULASTA ONPRO KIT) injection 6 mg, 6 mg, Subcutaneous, Once, 2 of 6 cycles  vinCRIStine (ONCOVIN) 2 mg in sodium chloride 0.9 % 50 mL chemo infusion, 2 mg, Intravenous,  Once, 2 of 6 cycles  riTUXimab (RITUXAN) 600 mg in sodium chloride 0.9 % 250 mL (1.9355 mg/mL) chemo infusion, 375 mg/m2 = 600 mg, Intravenous,  Once, 2 of 6  cycles  cyclophosphamide (CYTOXAN) 1,180  mg in sodium chloride 0.9 % 250 mL chemo infusion, 750 mg/m2 = 1,180 mg, Intravenous,  Once, 2 of 6 cycles  for chemotherapy treatment.     08/03/2016 Pathology Results   Cytogenetic lab results [facility]). Cytogenetic Analysis: Normal.    09/23/2016 PET scan   1. Significantly improved appearance, with resolution of prior hypermetabolic activity in all of the previously involved lymph nodes. All lymph nodes are currently within normal size limits. 2. Other imaging findings of potential clinical significance: Chronic right sphenoid sinusitis. Right mastoid effusion. Coronary, aortic arch, and branch vessel atherosclerotic vascular disease. Severe emphysema. Aortoiliac atherosclerotic vascular disease.   11/12/2016 -  Chemotherapy   Completed cycle 6 of RCHOP    12/11/2016 PET scan   1. No evidence of residual hypermetabolic lymphoma or adenopathy. 2. Age advanced coronary artery atherosclerosis. Recommend assessment of coronary risk factors and consideration of medical therapy. 3. Centrilobular emphysema.   06/18/2017 PET scan   IMPRESSION: 1. No new or recurrent hypermetabolic adenopathy in the neck, chest, abdomen, or pelvis. 2. There is potentially some mild wall thickening along a 6 cm segment of the distal ileum. Scattered accentuated metabolic activity in the bowel, likely physiologic. There is some fat deposition in the proximal colon wall, which is typically considered incidental although there is a weak association with inflammatory bowel disease. 3. Aortic Atherosclerosis (ICD10-I70.0) and Emphysema (ICD10-J43.9). Coronary atherosclerosis.     CANCER STAGING: Cancer Staging Lymphoma, large-cell, diffuse ([place]) Staging form: Hodgkin and Non-Hodgkin Lymphoma, AJCC 8th Edition - Clinical stage from 07/31/2016: Stage III (Diffuse large B-cell lymphoma) - Signed by Baird Cancer, PA-C on 07/31/2016    INTERVAL HISTORY:  Ms.  Love 64 y.o. female returns for routine follow-up for large B-cell lymphoma.  Patient reports she is being seen between her scheduled visits for an enlarged lymph node.  She reports the lymph node becomes enlarged when she is having sinus and allergy issues.  She takes Benadryl and allergy medicine the lymph node becomes less painful and decreases in size.  The lymph node is not palpable today on this visit.  She denies any fevers, chills, night sweats or unexplained weight loss. Denies any nausea, vomiting, or diarrhea. Denies any new pains. Had not noticed any recent bleeding such as epistaxis, hematuria or hematochezia. Denies recent chest pain on exertion, shortness of breath on minimal exertion, pre-syncopal episodes, or palpitations. Denies any numbness or tingling in hands or feet. Denies any recent fevers, infections, or recent hospitalizations. Patient reports appetite at 100% and energy level at 75%.  She is eating well maintain her weight at this time.    REVIEW OF SYSTEMS:  Review of Systems  Gastrointestinal: Positive for diarrhea.       IBS  Neurological: Positive for headaches.  All other systems reviewed and are negative.    PAST MEDICAL/SURGICAL HISTORY:  Past Medical History:  Diagnosis Date  . Anxiety   . Arthritis   . DLBCL (diffuse large B cell lymphoma) ([place]) 07/16/2016  . GERD (gastroesophageal reflux disease)   . Heart disease   . Hyperlipidemia   . Hypertension   . Irritable bowel syndrome (IBS)    with diarrhea  . Myocardial infarction ([place]) 12/19/2009   released from cardiology   Past Surgical History:  Procedure Laterality Date  . CARDIAC CATHETERIZATION     cardiac stent  . CHOLECYSTECTOMY    . CORONARY STENT PLACEMENT  2011  . INGUINAL LYMPH NODE BIOPSY Left 07/13/2016   Procedure: EXCISIONAL BIOPSY  OF LEFT INGUINAL LYMPH NODE;  Surgeon: Vickie Epley, MD;  Location: [facility];  Service: General;  Laterality: Left;  . PORT-A-CATH REMOVAL Right  05/20/2018   Procedure: MINOR REMOVAL PORT-A-CATH;  Surgeon: Aviva Signs, MD;  Location: [facility];  Service: General;  Laterality: Right;  . PORTACATH PLACEMENT N/A 07/17/2016   Procedure: INSERTION OF TUNNELED CENTRAL VENOUS CATHETER WITH SUBCUTANEOUS PORT;  Surgeon: Vickie Epley, MD;  Location: [facility];  Service: Vascular;  Laterality: N/A;  . TUBAL LIGATION       SOCIAL HISTORY:  Social History   Socioeconomic History  . Marital status: Single    Spouse name: Not on file  . Number of children: Not on file  . Years of education: Not on file  . Highest education level: Not on file  Occupational History  . Not on file  Tobacco Use  . Smoking status: Current Every Day Smoker    Packs/day: 0.50    Years: 40.00    Pack years: 20.00    Types: Cigarettes  . Smokeless tobacco: Never Used  Substance and Sexual Activity  . Alcohol use: No  . Drug use: No  . Sexual activity: Yes    Birth control/protection: Post-menopausal  Other Topics Concern  . Not on file  Social History Narrative  . Not on file   Social Determinants of Health   Financial Resource Strain:   . Difficulty of Paying Living Expenses:   Food Insecurity:   . Worried About Charity fundraiser in the Last Year:   . Arboriculturist in the Last Year:   Transportation Needs:   . Film/video editor (Medical):   Marland Kitchen Lack of Transportation (Non-Medical):   Physical Activity:   . Days of Exercise per Week:   . Minutes of Exercise per Session:   Stress:   . Feeling of Stress :   Social Connections:   . Frequency of Communication with Friends and Family:   . Frequency of Social Gatherings with Friends and Family:   . Attends Religious Services:   . Active Member of Clubs or Organizations:   . Attends Archivist Meetings:   Marland Kitchen Marital Status:   Intimate Partner Violence:   . Fear of Current or Ex-Partner:   . Emotionally Abused:   Marland Kitchen Physically Abused:   . Sexually Abused:     FAMILY HISTORY:    Family History  Problem Relation Age of Onset  . Cancer Mother        colon  . Depression Mother   . Heart disease Father   . Depression Sister   . Cancer Sister        leukemia  . Hyperlipidemia Brother   . Hypertension Brother     CURRENT MEDICATIONS:  Outpatient Encounter Medications as of 01/16/2020  Medication Sig  . albuterol (VENTOLIN HFA) 108 (90 Base) MCG/ACT inhaler SMARTSIG:1-2 Puff(s) By Mouth Every 4-6 Hours PRN  . ALPRAZolam (XANAX) 0.5 MG tablet Take 0.25-0.5 mg by mouth daily as needed for anxiety.   Marland Kitchen buPROPion (WELLBUTRIN XL) 150 MG 24 hr tablet Take 150 mg by mouth daily.  . fluticasone (FLONASE) 50 MCG/ACT nasal spray Place into both nostrils daily.  Marland Kitchen gabapentin (NEURONTIN) 100 MG capsule TAKE 1 CAPSULE BY MOUTH 2 TIMES A DAY.  . pantoprazole (PROTONIX) 40 MG tablet Take 40 mg by mouth daily. Pt takes at supper daily  . simvastatin (ZOCOR) 20 MG tablet Take 20 mg by mouth daily.  . [  DISCONTINUED] acetaminophen (TYLENOL) 500 MG tablet Take 500-1,000 mg by mouth every 6 (six) hours as needed for mild pain or moderate pain.  . [DISCONTINUED] simvastatin (ZOCOR) 20 MG tablet Take 20 mg by mouth at bedtime.  . [DISCONTINUED] traMADol (ULTRAM) 50 MG tablet Take 50 mg by mouth every 6 (six) hours as needed.   No facility-administered encounter medications on file as of 01/16/2020.    ALLERGIES:  Allergies  Allergen Reactions  . Amoxicillin Nausea And Vomiting    Yeast Infections  Has patient had a PCN reaction causing immediate rash, facial/tongue/throat swelling, SOB or lightheadedness with hypotension: No Has patient had a PCN reaction causing severe rash involving mucus membranes or skin necrosis: No Has patient had a PCN reaction that required hospitalization No Has patient had a PCN reaction occurring within the last 10 years: No If all of the above answers are "NO", then may proceed with Cephalosporin use.   Marland Kitchen Choline Fenofibrate     REACTION: Abd pain  and elevated Lipase  . Penicillins     Yeast infections, thrush  Has patient had a PCN reaction causing immediate rash, facial/tongue/throat swelling, SOB or lightheadedness with hypotension: No Has patient had a PCN reaction causing severe rash involving mucus membranes or skin necrosis: No Has patient had a PCN reaction that required hospitalization No Has patient had a PCN reaction occurring within the last 10 years: No If all of the above answers are "NO", then may proceed with Cephalosporin use.   Marland Kitchen Percocet [Oxycodone-Acetaminophen] Nausea And Vomiting     PHYSICAL EXAM:  ECOG Performance status: 1  Vitals:   01/16/20 0927  BP: (!) 141/66  Pulse: (!) 101  Resp: 17  Temp: 98.3 F (36.8 C)  SpO2: 99%   Filed Weights   01/16/20 0927  Weight: 114 lb 3.2 oz (51.8 kg)   Physical Exam Constitutional:      Appearance: Normal appearance. She is normal weight.  Cardiovascular:     Rate and Rhythm: Normal rate and regular rhythm.     Heart sounds: Normal heart sounds.  Pulmonary:     Effort: Pulmonary effort is normal.     Breath sounds: Normal breath sounds.  Abdominal:     General: Bowel sounds are normal.     Palpations: Abdomen is soft.  Musculoskeletal:        General: Normal range of motion.  Skin:    General: Skin is warm.  Neurological:     Mental Status: She is alert and oriented to person, place, and time. Mental status is at baseline.  Psychiatric:        Mood and Affect: Mood normal.        Behavior: Behavior normal.        Thought Content: Thought content normal.        Judgment: Judgment normal.      LABORATORY DATA:  I have reviewed the labs as listed.  CBC    Component Value Date/Time   WBC 5.7 02/20/2019 1337   RBC 4.81 02/20/2019 1337   HGB 15.2 (H) 02/20/2019 1337   HCT 46.7 (H) 02/20/2019 1337   PLT 310 02/20/2019 1337   MCV 97.1 02/20/2019 1337   MCH 31.6 02/20/2019 1337   MCHC 32.5 02/20/2019 1337   RDW 13.2 02/20/2019 1337    LYMPHSABS 1.5 02/20/2019 1337   MONOABS 0.5 02/20/2019 1337   EOSABS 0.1 02/20/2019 1337   BASOSABS 0.1 02/20/2019 1337   CMP Latest Ref Rng & Units  08/25/2019 02/20/2019 10/27/2018  Glucose 70 - 99 mg/dL 102(H) 113(H) 117(H)  BUN 8 - 23 mg/dL 10 9 7(L)  Creatinine 0.44 - 1.00 mg/dL 0.84 0.79 0.80  Sodium 135 - 145 mmol/L 140 140 139  Potassium 3.5 - 5.1 mmol/L 4.5 3.7 3.5  Chloride 98 - 111 mmol/L 102 105 102  CO2 22 - 32 mmol/L _0 Calcium 8.9 - 10.3 mg/dL 9.3 9.5 9.2  Total Protein 6.5 - 8.1 g/dL 6.6 6.9 7.1  Total Bilirubin 0.3 - 1.2 mg/dL 0.6 0.6 0.6  Alkaline Phos 38 - 126 U/L 83 93 96  AST 15 - 41 U/L _1 ALT 0 - 44 U/L _2 All questions were answered to patient's stated satisfaction. Encouraged patient to call with any new concerns or questions before his next visit to the cancer center and we can certain see him sooner, if needed.     ASSESSMENT & PLAN:  Lymphoma, large-cell, diffuse ([facility]) 1.  Stage IIIb large B-cell lymphoma: -Presentation with weight loss, night sweats, adenopathy in the neck, abdomen and right inguinal area. -6 cycles of R-CHOP from 07/30/2016 through 11/12/2016. -Denies fevers, night sweats or weight loss.  Denies any recurrent infections or hospitalizations. -CT CAP on 06/21/2018 did not show any evidence of adenopathy in the chest, abdomen and pelvis.  Innumerable new groundglass nodules in both lungs. -She is a current active smoker. -CT of the chest with contrast dated 10/27/2018 which did not show any thoracic adenopathy to suggest recurrent or residual lymphoma.  Resolution of the majority of the previously described groundglass nodules.  A left upper lobe 6 mm groundglass nodule persist. -CT scan follow-up in 2 years was recommended. -Today's physical examination did not reveal any palpable adenopathy. -She was seen today for a lymph node on the right side of her neck.  She reports it becomes enlarged when she is having allergy  issues.  She takes Benadryl and allergy medicines and the lymph node becomes less painful and goes away.  Lymph node was not palpable today.  He denies any fevers, chills, night sweats or unexplained weight loss. -She will return to clinic in 2 months with labs.  2.  Left femur pain: -Developed left femur pain in June secondary to injury. -Plain films performed on 02/10/2019 did not reveal any acute findings.  MRI recommended but unable to do due to claustrophobia. -CT of femur on 02/20/2019 revealed no acute osseous abnormality.  Revealed left hip osteoarthritis as well as left knee medial compartment osteoarthritis.  Also of note CT showed diffuse bowel wall thickening of sigmoid colon which was again noted from the previous scan in 2019.  Patient was told to follow-up with GI for colonoscopy.  Patient refused colonoscopy. -Pain has somewhat resolved.  3.  Left colon thickening: -Her last colonoscopy was in 1999. -CT scan showed thickening of the sigmoid colon area.  The current CT scan on 06/21/2018 showed improved appearance of the colon since 12/21/2017. -Again consult with GI for colonoscopy was recommended -Patient refuses colonoscopy.  Patient reports she denies any bright red bleeding per rectum or melena. 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  4.  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 -We will refill her prescription for gabapentin      Orders placed this encounter:  No orders of the  defined types were placed in this encounter.     Francene Finders, FNP-C [facility] [phone]

## 2020-01-16 NOTE — Assessment & Plan Note (Signed)
1.  Stage IIIb large B-cell lymphoma: -Presentation with weight loss, night sweats, adenopathy in the neck, abdomen and right inguinal area. -6 cycles of R-CHOP from 07/30/2016 through 11/12/2016. -Denies fevers, night sweats or weight loss.  Denies any recurrent infections or hospitalizations. -CT CAP on 06/21/2018 did not show any evidence of adenopathy in the chest, abdomen and pelvis.  Innumerable new groundglass nodules in both lungs. -She is a current active smoker. -CT of the chest with contrast dated 10/27/2018 which did not show any thoracic adenopathy to suggest recurrent or residual lymphoma.  Resolution of the majority of the previously described groundglass nodules.  A left upper lobe 6 mm groundglass nodule persist. -CT scan follow-up in 2 years was recommended. -Today's physical examination did not reveal any palpable adenopathy. -She was seen today for a lymph node on the right side of her neck.  She reports it becomes enlarged when she is having allergy issues.  She takes Benadryl and allergy medicines and the lymph node becomes less painful and goes away.  Lymph node was not palpable today.  He denies any fevers, chills, night sweats or unexplained weight loss. -She will return to clinic in 2 months with labs.  2.  Left femur pain: -Developed left femur pain in June secondary to injury. -Plain films performed on 02/10/2019 did not reveal any acute findings.  MRI recommended but unable to do due to claustrophobia. -CT of femur on 02/20/2019 revealed no acute osseous abnormality.  Revealed left hip osteoarthritis as well as left knee medial compartment osteoarthritis.  Also of note CT showed diffuse bowel wall thickening of sigmoid colon which was again noted from the previous scan in 2019.  Patient was told to follow-up with GI for colonoscopy.  Patient refused colonoscopy. -Pain has somewhat resolved.  3.  Left colon thickening: -Her last colonoscopy was in 1999. -CT scan showed  thickening of the sigmoid colon area.  The current CT scan on 06/21/2018 showed improved appearance of the colon since 12/21/2017. -Again consult with GI for colonoscopy was recommended -Patient refuses colonoscopy.  Patient reports she denies any bright red bleeding per rectum or melena. 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  4.  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 -We will refill her prescription for gabapentin

## 2020-02-09 ENCOUNTER — Encounter: Payer: Self-pay | Admitting: Orthopedic Surgery

## 2020-02-09 ENCOUNTER — Other Ambulatory Visit: Payer: Self-pay

## 2020-02-09 ENCOUNTER — Ambulatory Visit: Payer: Medicare Other | Admitting: Orthopedic Surgery

## 2020-02-09 VITALS — BP 133/68 | HR 88 | Ht 61.5 in | Wt 114.0 lb

## 2020-02-09 DIAGNOSIS — M7711 Lateral epicondylitis, right elbow: Secondary | ICD-10-CM

## 2020-02-09 NOTE — Progress Notes (Signed)
Chief Complaint  Patient presents with  . Elbow Pain    Right feeling better but still having some pain   64 year old female treated with some right tennis elbow  She did not want an injection so we are using topical medications ice home exercises and bracing she has made some improvement but still has some discomfort  Initial office visit  Follow-up in 62 weeks 64 year old female presents with 3-week history of pain lateral aspect right elbow after doing some moving including a dresser was treated with tramadol prednisone did not help comes in with lateral elbow pain   Physical Exam Musculoskeletal:       Arms:    Encounter Diagnosis  Name Primary?  . Lateral epicondylitis of right elbow Yes   Continue current treatment plan follow-up in 2 to 3 months

## 2020-02-09 NOTE — Patient Instructions (Signed)
Continue exercises and braces

## 2020-03-05 ENCOUNTER — Inpatient Hospital Stay (HOSPITAL_COMMUNITY): Payer: Medicare Other

## 2020-03-05 ENCOUNTER — Other Ambulatory Visit: Payer: Self-pay

## 2020-03-05 ENCOUNTER — Inpatient Hospital Stay (HOSPITAL_COMMUNITY): Payer: Medicare Other | Attending: Hematology | Admitting: Hematology

## 2020-03-05 VITALS — BP 123/74 | HR 92 | Temp 96.9°F | Resp 18 | Wt 114.6 lb

## 2020-03-05 DIAGNOSIS — G620 Drug-induced polyneuropathy: Secondary | ICD-10-CM | POA: Insufficient documentation

## 2020-03-05 DIAGNOSIS — K589 Irritable bowel syndrome without diarrhea: Secondary | ICD-10-CM | POA: Insufficient documentation

## 2020-03-05 DIAGNOSIS — R918 Other nonspecific abnormal finding of lung field: Secondary | ICD-10-CM | POA: Insufficient documentation

## 2020-03-05 DIAGNOSIS — K219 Gastro-esophageal reflux disease without esophagitis: Secondary | ICD-10-CM | POA: Insufficient documentation

## 2020-03-05 DIAGNOSIS — I10 Essential (primary) hypertension: Secondary | ICD-10-CM | POA: Diagnosis not present

## 2020-03-05 DIAGNOSIS — G629 Polyneuropathy, unspecified: Secondary | ICD-10-CM | POA: Diagnosis not present

## 2020-03-05 DIAGNOSIS — F17210 Nicotine dependence, cigarettes, uncomplicated: Secondary | ICD-10-CM | POA: Diagnosis not present

## 2020-03-05 DIAGNOSIS — Z79899 Other long term (current) drug therapy: Secondary | ICD-10-CM | POA: Diagnosis not present

## 2020-03-05 DIAGNOSIS — Z9221 Personal history of antineoplastic chemotherapy: Secondary | ICD-10-CM | POA: Diagnosis not present

## 2020-03-05 DIAGNOSIS — T451X5A Adverse effect of antineoplastic and immunosuppressive drugs, initial encounter: Secondary | ICD-10-CM | POA: Insufficient documentation

## 2020-03-05 DIAGNOSIS — C8330 Diffuse large B-cell lymphoma, unspecified site: Secondary | ICD-10-CM | POA: Diagnosis not present

## 2020-03-05 DIAGNOSIS — R5383 Other fatigue: Secondary | ICD-10-CM | POA: Insufficient documentation

## 2020-03-05 DIAGNOSIS — M1990 Unspecified osteoarthritis, unspecified site: Secondary | ICD-10-CM | POA: Diagnosis not present

## 2020-03-05 DIAGNOSIS — I252 Old myocardial infarction: Secondary | ICD-10-CM | POA: Diagnosis not present

## 2020-03-05 DIAGNOSIS — E785 Hyperlipidemia, unspecified: Secondary | ICD-10-CM | POA: Insufficient documentation

## 2020-03-05 LAB — COMPREHENSIVE METABOLIC PANEL
ALT: 13 U/L (ref 0–44)
AST: 14 U/L — ABNORMAL LOW (ref 15–41)
Albumin: 4.4 g/dL (ref 3.5–5.0)
Alkaline Phosphatase: 96 U/L (ref 38–126)
Anion gap: 11 (ref 5–15)
BUN: 12 mg/dL (ref 8–23)
CO2: 27 mmol/L (ref 22–32)
Calcium: 9.4 mg/dL (ref 8.9–10.3)
Chloride: 101 mmol/L (ref 98–111)
Creatinine, Ser: 0.81 mg/dL (ref 0.44–1.00)
GFR calc Af Amer: >60 mL/min (ref >60)
GFR calc non Af Amer: >60 mL/min (ref >60)
Glucose, Bld: 121 mg/dL — ABNORMAL HIGH (ref 70–99)
Potassium: 4.1 mmol/L (ref 3.5–5.1)
Sodium: 139 mmol/L (ref 135–145)
Total Bilirubin: 0.8 mg/dL (ref 0.3–1.2)
Total Protein: 6.9 g/dL (ref 6.5–8.1)

## 2020-03-05 LAB — CBC WITH DIFFERENTIAL/PLATELET
Abs Immature Granulocytes: 0.02 10*3/uL (ref 0.00–0.07)
Basophils Absolute: 0.1 10*3/uL (ref 0.0–0.1)
Basophils Relative: 1 %
Eosinophils Absolute: 0.1 10*3/uL (ref 0.0–0.5)
Eosinophils Relative: 1 %
HCT: 46.6 % — ABNORMAL HIGH (ref 36.0–46.0)
Hemoglobin: 15 g/dL (ref 12.0–15.0)
Immature Granulocytes: 0 %
Lymphocytes Relative: 16 %
Lymphs Abs: 1.1 10*3/uL (ref 0.7–4.0)
MCH: 31.5 pg (ref 26.0–34.0)
MCHC: 32.2 g/dL (ref 30.0–36.0)
MCV: 97.9 fL (ref 80.0–100.0)
Monocytes Absolute: 0.5 10*3/uL (ref 0.1–1.0)
Monocytes Relative: 8 %
Neutro Abs: 4.7 10*3/uL (ref 1.7–7.7)
Neutrophils Relative %: 74 %
Platelets: 218 10*3/uL (ref 150–400)
RBC: 4.76 MIL/uL (ref 3.87–5.11)
RDW: 13.1 % (ref 11.5–15.5)
WBC: 6.5 10*3/uL (ref 4.0–10.5)
nRBC: 0 % (ref 0.0–0.2)

## 2020-03-05 LAB — VITAMIN D 25 HYDROXY (VIT D DEFICIENCY, FRACTURES): Vit D, 25-Hydroxy: 34.51 ng/mL (ref 30–100)

## 2020-03-05 LAB — LACTATE DEHYDROGENASE: LDH: 127 U/L (ref 98–192)

## 2020-03-05 LAB — VITAMIN B12: Vitamin B-12: 154 pg/mL — ABNORMAL LOW (ref 180–914)

## 2020-03-05 MED ORDER — GABAPENTIN 100 MG PO CAPS: ORAL_CAPSULE | ORAL | 6 refills | Status: DC

## 2020-03-05 NOTE — Progress Notes (Signed)
[place], [place] [address]   CLINIC:  Medical Oncology/Hematology  PCP:  Celene Squibb, MD [address] [name] [address]  [phone]  REASON FOR VISIT:  Follow-up for DLBCL  PRIOR THERAPY: R-CHOP x 6 cycles from 07/30/2016 to 11/12/2016  CURRENT THERAPY: Observation  INTERVAL HISTORY:  [name], a 64 y.o. female, returns for routine follow-up for her DLBCL. Gabriella Love was last seen on 10/28/2018.  Today she reports that she is doing well. She reports having a couple lymph nodes swelling up on the right side of her neck, but they resolved on their own after taking allergy meds for 2 weeks. She still has numbness in her toes for which she takes gabapentin.  She received her second [place] vaccine in 11/07/2019.   REVIEW OF SYSTEMS:  Review of Systems  Constitutional: Positive for fatigue (severe). Negative for appetite change.  Neurological: Positive for numbness (toes).  All other systems reviewed and are negative.   PAST MEDICAL/SURGICAL HISTORY:  Past Medical History:  Diagnosis Date  . Anxiety   . Arthritis   . DLBCL (diffuse large B cell lymphoma) ([place]) 07/16/2016  . GERD (gastroesophageal reflux disease)   . Heart disease   . Hyperlipidemia   . Hypertension   . Irritable bowel syndrome (IBS)    with diarrhea  . Myocardial infarction ([place]) 12/19/2009   released from cardiology   Past Surgical History:  Procedure Laterality Date  . CARDIAC CATHETERIZATION     cardiac stent  . CHOLECYSTECTOMY    . CORONARY STENT PLACEMENT  2011  . INGUINAL LYMPH NODE BIOPSY Left 07/13/2016   Procedure: EXCISIONAL BIOPSY OF LEFT INGUINAL LYMPH NODE;  Surgeon: Vickie Epley, MD;  Location: [facility];  Service: General;  Laterality: Left;  . PORT-A-CATH REMOVAL Right 05/20/2018   Procedure: MINOR REMOVAL PORT-A-CATH;  Surgeon: Aviva Signs, MD;  Location: [facility];  Service: General;  Laterality: Right;  .  PORTACATH PLACEMENT N/A 07/17/2016   Procedure: INSERTION OF TUNNELED CENTRAL VENOUS CATHETER WITH SUBCUTANEOUS PORT;  Surgeon: Vickie Epley, MD;  Location: [facility];  Service: Vascular;  Laterality: N/A;  . TUBAL LIGATION      SOCIAL HISTORY:  Social History   Socioeconomic History  . Marital status: Single    Spouse name: Not on file  . Number of children: Not on file  . Years of education: Not on file  . Highest education level: Not on file  Occupational History  . Not on file  Tobacco Use  . Smoking status: Current Every Day Smoker    Packs/day: 0.50    Years: 40.00    Pack years: 20.00    Types: Cigarettes  . Smokeless tobacco: Never Used  Substance and Sexual Activity  . Alcohol use: No  . Drug use: No  . Sexual activity: Yes    Birth control/protection: Post-menopausal  Other Topics Concern  . Not on file  Social History Narrative  . Not on file   Social Determinants of Health   Financial Resource Strain:   . Difficulty of Paying Living Expenses:   Food Insecurity:   . Worried About Charity fundraiser in the Last Year:   . Arboriculturist in the Last Year:   Transportation Needs:   . Film/video editor (Medical):   Marland Kitchen Lack of Transportation (Non-Medical):   Physical Activity:   . Days of Exercise per Week:   .  Minutes of Exercise per Session:   Stress:   . Feeling of Stress :   Social Connections:   . Frequency of Communication with Friends and Family:   . Frequency of Social Gatherings with Friends and Family:   . Attends Religious Services:   . Active Member of Clubs or Organizations:   . Attends Archivist Meetings:   Marland Kitchen Marital Status:   Intimate Partner Violence:   . Fear of Current or Ex-Partner:   . Emotionally Abused:   Marland Kitchen Physically Abused:   . Sexually Abused:     FAMILY HISTORY:  Family History  Problem Relation Age of Onset  . Cancer Mother        colon  . Depression Mother   . Heart disease Father   . Depression  Sister   . Cancer Sister        leukemia  . Hyperlipidemia Brother   . Hypertension Brother     CURRENT MEDICATIONS:  Current Outpatient Medications  Medication Sig Dispense Refill  . buPROPion (WELLBUTRIN XL) 150 MG 24 hr tablet Take 150 mg by mouth daily.    . fluticasone (FLONASE) 50 MCG/ACT nasal spray Place into both nostrils daily.    Marland Kitchen gabapentin (NEURONTIN) 100 MG capsule TAKE 1 CAPSULE BY MOUTH 2 TIMES A DAY. 60 capsule 6  . pantoprazole (PROTONIX) 40 MG tablet Take 40 mg by mouth daily. Pt takes at supper daily    . simvastatin (ZOCOR) 20 MG tablet Take 20 mg by mouth daily.    Marland Kitchen albuterol (VENTOLIN HFA) 108 (90 Base) MCG/ACT inhaler SMARTSIG:1-2 Puff(s) By Mouth Every 4-6 Hours PRN (Patient not taking: Reported on 03/05/2020)    . ALPRAZolam (XANAX) 0.5 MG tablet Take 0.25-0.5 mg by mouth daily as needed for anxiety.  (Patient not taking: Reported on 03/05/2020)     No current facility-administered medications for this visit.    ALLERGIES:  Allergies  Allergen Reactions  . Amoxicillin Nausea And Vomiting    Yeast Infections  Has patient had a PCN reaction causing immediate rash, facial/tongue/throat swelling, SOB or lightheadedness with hypotension: No Has patient had a PCN reaction causing severe rash involving mucus membranes or skin necrosis: No Has patient had a PCN reaction that required hospitalization No Has patient had a PCN reaction occurring within the last 10 years: No If all of the above answers are "NO", then may proceed with Cephalosporin use.   Marland Kitchen Choline Fenofibrate     REACTION: Abd pain and elevated Lipase  . Penicillins     Yeast infections, thrush  Has patient had a PCN reaction causing immediate rash, facial/tongue/throat swelling, SOB or lightheadedness with hypotension: No Has patient had a PCN reaction causing severe rash involving mucus membranes or skin necrosis: No Has patient had a PCN reaction that required hospitalization No Has patient had  a PCN reaction occurring within the last 10 years: No If all of the above answers are "NO", then may proceed with Cephalosporin use.   Marland Kitchen Percocet [Oxycodone-Acetaminophen] Nausea And Vomiting    PHYSICAL EXAM:  Performance status (ECOG): 1 - Symptomatic but completely ambulatory  Vitals:   03/05/20 1024  BP: 123/74  Pulse: 92  Resp: 18  Temp: (!) 96.9 F (36.1 C)  SpO2: 94%   Wt Readings from Last 3 Encounters:  03/05/20 114 lb 9.6 oz (52 kg)  02/09/20 114 lb (51.7 kg)  01/16/20 114 lb 3.2 oz (51.8 kg)   Physical Exam Vitals reviewed.  Constitutional:  Appearance: Normal appearance.  Cardiovascular:     Rate and Rhythm: Normal rate and regular rhythm.     Pulses: Normal pulses.     Heart sounds: Normal heart sounds.  Pulmonary:     Effort: Pulmonary effort is normal.     Breath sounds: Normal breath sounds.  Abdominal:     Palpations: Abdomen is soft. There is no mass.     Tenderness: There is no abdominal tenderness.  Musculoskeletal:     Right lower leg: No edema.     Left lower leg: No edema.  Lymphadenopathy:     Cervical: No cervical adenopathy.     Upper Body:     Right upper body: No supraclavicular or axillary adenopathy.     Left upper body: No supraclavicular or axillary adenopathy.     Lower Body: No right inguinal adenopathy. No left inguinal adenopathy.  Neurological:     General: No focal deficit present.     Mental Status: She is alert and oriented to person, place, and time.  Psychiatric:        Mood and Affect: Mood normal.        Behavior: Behavior normal.     LABORATORY DATA:  I have reviewed the labs as listed.  CBC Latest Ref Rng & Units 03/05/2020 02/20/2019 10/27/2018  WBC 4.0 - 10.5 K/uL 6.5 5.7 7.0  Hemoglobin 12.0 - 15.0 g/dL 15.0 15.2(H) 15.1(H)  Hematocrit 36 - 46 % 46.6(H) 46.7(H) 47.2(H)  Platelets 150 - 400 K/uL 218 310 289   CMP Latest Ref Rng & Units 03/05/2020 08/25/2019 02/20/2019  Glucose 70 - 99 mg/dL 121(H) 102(H)  113(H)  BUN 8 - 23 mg/dL 12 10 9   Creatinine 0.44 - 1.00 mg/dL 0.81 0.84 0.79  Sodium 135 - 145 mmol/L 139 140 140  Potassium 3.5 - 5.1 mmol/L 4.1 4.5 3.7  Chloride 98 - 111 mmol/L 101 102 105  CO2 22 - 32 mmol/L 27 28 28   Calcium 8.9 - 10.3 mg/dL 9.4 9.3 9.5  Total Protein 6.5 - 8.1 g/dL 6.9 6.6 6.9  Total Bilirubin 0.3 - 1.2 mg/dL 0.8 0.6 0.6  Alkaline Phos 38 - 126 U/L 96 83 93  AST 15 - 41 U/L 14(L) 16 16  ALT 0 - 44 U/L 13 20 17       Component Value Date/Time   RBC 4.76 03/05/2020 0959   MCV 97.9 03/05/2020 0959   MCH 31.5 03/05/2020 0959   MCHC 32.2 03/05/2020 0959   RDW 13.1 03/05/2020 0959   LYMPHSABS 1.1 03/05/2020 0959   MONOABS 0.5 03/05/2020 0959   EOSABS 0.1 03/05/2020 0959   BASOSABS 0.1 03/05/2020 0959   Lab Results  Component Value Date   LDH 127 03/05/2020   LDH 128 08/25/2019   LDH 137 02/20/2019  No results found for: VD25OH   DIAGNOSTIC IMAGING:  I have independently reviewed the scans and discussed with the patient. No results found.   ASSESSMENT:  1.  Stage IIIb large B-cell lymphoma: -Presentation with weight loss, night sweats, adenopathy in the neck, abdomen and right inguinal area -6 cycles of R-CHOP from 07/30/2016 through 11/12/2016 - Denies fevers, night sweats or weight loss.  Denies any recurrent infections or hospitalizations. -CT CAP on 06/21/2018 did not show any evidence of adenopathy in the chest, abdomen and pelvis.  Innumerable new groundglass nodules in both lungs.  2.  Left colon thickening: -Her last colonoscopy was in 1999. - Her last CT scan showed thickening of the sigmoid  colon area.  The current CT scan on 06/21/2018 showed improved appearance of the colon since 12/21/2017.  She denies any bleeding per rectum or melena.  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  3.  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 - We have renewed her gabapentin.     PLAN:  1.  Stage IIIb large B-cell  lymphoma: -Physical examination today did not reveal any palpable adenopathy.  No B symptoms. -Review of labs shows normal LFTs and CBC.  LDH was normal. -B12 was low.  She was told to take B12 1 mg tablet daily. -Plan to see her back in 6 months with labs.  2.  Smoking history: -Last CT scan was on 10/27/2018.  A CT follow-up in 2 years was recommended.  3.  Neuropathy: -Continue gabapentin.  We have sent a refill.   Orders placed this encounter:  No orders of the defined types were placed in this encounter.    Derek Jack, MD [place] [phone]   I, Milinda Antis, am acting as a scribe for Dr. Sanda Linger.  I, Derek Jack MD, have reviewed the above documentation for accuracy and completeness, and I agree with the above.

## 2020-03-05 NOTE — Patient Instructions (Signed)
[facility] at [facility] Discharge Instructions  You were seen today by Dr. Katragadda. He went over your recent results. Dr. Katragadda will see you back in 6 months for labs and follow up.   Thank you for choosing [facility] at [facility] to provide your oncology and hematology care.  To afford each patient quality time with our provider, please arrive at least 15 minutes before your scheduled appointment time.   If you have a lab appointment with the Cancer Center please come in thru the Main Entrance and check in at the main information desk  You need to re-schedule your appointment should you arrive 10 or more minutes late.  We strive to give you quality time with our providers, and arriving late affects you and other patients whose appointments are after yours.  Also, if you no show three or more times for appointments you may be dismissed from the clinic at the providers discretion.     Again, thank you for choosing [facility].  Our hope is that these requests will decrease the amount of time that you wait before being seen by our physicians.       _____________________________________________________________  Should you have questions after your visit to [facility], please contact our office at [phone] between the hours of 8:00 a.m. and 4:30 p.m.  Voicemails left after 4:00 p.m. will not be returned until the following business day.  For prescription refill requests, have your pharmacy contact our office and allow 72 hours.    Cancer Center Support Programs:   > Cancer Support Group  2nd Tuesday of the month 1pm-2pm, Journey Room    

## 2020-05-13 ENCOUNTER — Ambulatory Visit: Payer: Medicare Other | Admitting: Orthopedic Surgery

## 2020-06-03 ENCOUNTER — Encounter: Payer: Self-pay | Admitting: Orthopedic Surgery

## 2020-06-03 ENCOUNTER — Ambulatory Visit: Payer: Medicare Other | Admitting: Orthopedic Surgery

## 2020-06-14 DIAGNOSIS — G9009 Other idiopathic peripheral autonomic neuropathy: Secondary | ICD-10-CM | POA: Diagnosis not present

## 2020-06-14 DIAGNOSIS — F17219 Nicotine dependence, cigarettes, with unspecified nicotine-induced disorders: Secondary | ICD-10-CM | POA: Diagnosis not present

## 2020-06-14 DIAGNOSIS — G620 Drug-induced polyneuropathy: Secondary | ICD-10-CM | POA: Diagnosis not present

## 2020-06-14 DIAGNOSIS — I10 Essential (primary) hypertension: Secondary | ICD-10-CM | POA: Diagnosis not present

## 2020-06-19 DIAGNOSIS — I2510 Atherosclerotic heart disease of native coronary artery without angina pectoris: Secondary | ICD-10-CM | POA: Diagnosis not present

## 2020-06-19 DIAGNOSIS — E782 Mixed hyperlipidemia: Secondary | ICD-10-CM | POA: Diagnosis not present

## 2020-06-19 DIAGNOSIS — Z0001 Encounter for general adult medical examination with abnormal findings: Secondary | ICD-10-CM | POA: Diagnosis not present

## 2020-06-19 DIAGNOSIS — I10 Essential (primary) hypertension: Secondary | ICD-10-CM | POA: Diagnosis not present

## 2020-06-19 DIAGNOSIS — F17219 Nicotine dependence, cigarettes, with unspecified nicotine-induced disorders: Secondary | ICD-10-CM | POA: Diagnosis not present

## 2020-08-01 ENCOUNTER — Other Ambulatory Visit: Payer: Self-pay

## 2020-08-01 ENCOUNTER — Other Ambulatory Visit (HOSPITAL_COMMUNITY): Payer: Self-pay | Admitting: Internal Medicine

## 2020-08-01 ENCOUNTER — Ambulatory Visit (HOSPITAL_COMMUNITY)
Admission: RE | Admit: 2020-08-01 | Discharge: 2020-08-01 | Disposition: A | Discharge status: Home / Self Care | Payer: Medicare Other | Source: Ambulatory Visit | Attending: Internal Medicine | Admitting: Internal Medicine

## 2020-08-01 DIAGNOSIS — R079 Chest pain, unspecified: Secondary | ICD-10-CM

## 2020-08-01 DIAGNOSIS — R0782 Intercostal pain: Secondary | ICD-10-CM | POA: Diagnosis not present

## 2020-08-01 DIAGNOSIS — M940 Chondrocostal junction syndrome [Tietze]: Secondary | ICD-10-CM | POA: Diagnosis not present

## 2020-09-10 ENCOUNTER — Inpatient Hospital Stay (HOSPITAL_COMMUNITY): Payer: Medicare Other

## 2020-09-10 ENCOUNTER — Other Ambulatory Visit: Payer: Self-pay

## 2020-09-10 ENCOUNTER — Inpatient Hospital Stay (HOSPITAL_COMMUNITY): Payer: Medicare Other | Attending: Hematology | Admitting: Hematology

## 2020-09-10 VITALS — BP 134/61 | HR 83 | Temp 96.8°F | Resp 20 | Wt 121.1 lb

## 2020-09-10 DIAGNOSIS — I10 Essential (primary) hypertension: Secondary | ICD-10-CM | POA: Insufficient documentation

## 2020-09-10 DIAGNOSIS — G629 Polyneuropathy, unspecified: Secondary | ICD-10-CM | POA: Diagnosis not present

## 2020-09-10 DIAGNOSIS — F419 Anxiety disorder, unspecified: Secondary | ICD-10-CM | POA: Diagnosis not present

## 2020-09-10 DIAGNOSIS — Z8349 Family history of other endocrine, nutritional and metabolic diseases: Secondary | ICD-10-CM | POA: Insufficient documentation

## 2020-09-10 DIAGNOSIS — K589 Irritable bowel syndrome without diarrhea: Secondary | ICD-10-CM | POA: Diagnosis not present

## 2020-09-10 DIAGNOSIS — Z8249 Family history of ischemic heart disease and other diseases of the circulatory system: Secondary | ICD-10-CM | POA: Diagnosis not present

## 2020-09-10 DIAGNOSIS — C8330 Diffuse large B-cell lymphoma, unspecified site: Secondary | ICD-10-CM | POA: Diagnosis not present

## 2020-09-10 DIAGNOSIS — I252 Old myocardial infarction: Secondary | ICD-10-CM | POA: Diagnosis not present

## 2020-09-10 DIAGNOSIS — F17210 Nicotine dependence, cigarettes, uncomplicated: Secondary | ICD-10-CM | POA: Diagnosis not present

## 2020-09-10 DIAGNOSIS — K219 Gastro-esophageal reflux disease without esophagitis: Secondary | ICD-10-CM | POA: Insufficient documentation

## 2020-09-10 DIAGNOSIS — E538 Deficiency of other specified B group vitamins: Secondary | ICD-10-CM | POA: Insufficient documentation

## 2020-09-10 DIAGNOSIS — E785 Hyperlipidemia, unspecified: Secondary | ICD-10-CM | POA: Diagnosis not present

## 2020-09-10 DIAGNOSIS — Z800 Family history of malignant neoplasm of digestive organs: Secondary | ICD-10-CM | POA: Diagnosis not present

## 2020-09-10 DIAGNOSIS — Z8572 Personal history of non-Hodgkin lymphomas: Secondary | ICD-10-CM | POA: Insufficient documentation

## 2020-09-10 DIAGNOSIS — Z79899 Other long term (current) drug therapy: Secondary | ICD-10-CM | POA: Insufficient documentation

## 2020-09-10 LAB — CBC WITH DIFFERENTIAL/PLATELET
Abs Immature Granulocytes: 0.02 10*3/uL (ref 0.00–0.07)
Basophils Absolute: 0.1 10*3/uL (ref 0.0–0.1)
Basophils Relative: 1 %
Eosinophils Absolute: 0.1 10*3/uL (ref 0.0–0.5)
Eosinophils Relative: 1 %
HCT: 46.1 % — ABNORMAL HIGH (ref 36.0–46.0)
Hemoglobin: 15 g/dL (ref 12.0–15.0)
Immature Granulocytes: 0 %
Lymphocytes Relative: 22 %
Lymphs Abs: 1.2 10*3/uL (ref 0.7–4.0)
MCH: 31.8 pg (ref 26.0–34.0)
MCHC: 32.5 g/dL (ref 30.0–36.0)
MCV: 97.9 fL (ref 80.0–100.0)
Monocytes Absolute: 0.4 10*3/uL (ref 0.1–1.0)
Monocytes Relative: 8 %
Neutro Abs: 3.6 10*3/uL (ref 1.7–7.7)
Neutrophils Relative %: 68 %
Platelets: 236 10*3/uL (ref 150–400)
RBC: 4.71 MIL/uL (ref 3.87–5.11)
RDW: 13.3 % (ref 11.5–15.5)
WBC: 5.4 10*3/uL (ref 4.0–10.5)
nRBC: 0 % (ref 0.0–0.2)

## 2020-09-10 LAB — COMPREHENSIVE METABOLIC PANEL
ALT: 14 U/L (ref 0–44)
AST: 17 U/L (ref 15–41)
Albumin: 4.2 g/dL (ref 3.5–5.0)
Alkaline Phosphatase: 82 U/L (ref 38–126)
Anion gap: 9 (ref 5–15)
BUN: 10 mg/dL (ref 8–23)
CO2: 27 mmol/L (ref 22–32)
Calcium: 9.6 mg/dL (ref 8.9–10.3)
Chloride: 105 mmol/L (ref 98–111)
Creatinine, Ser: 0.81 mg/dL (ref 0.44–1.00)
GFR, Estimated: >60 mL/min (ref >60)
Glucose, Bld: 121 mg/dL — ABNORMAL HIGH (ref 70–99)
Potassium: 4.5 mmol/L (ref 3.5–5.1)
Sodium: 141 mmol/L (ref 135–145)
Total Bilirubin: 0.7 mg/dL (ref 0.3–1.2)
Total Protein: 6.7 g/dL (ref 6.5–8.1)

## 2020-09-10 LAB — VITAMIN D 25 HYDROXY (VIT D DEFICIENCY, FRACTURES): Vit D, 25-Hydroxy: 35.37 ng/mL (ref 30–100)

## 2020-09-10 LAB — LACTATE DEHYDROGENASE: LDH: 141 U/L (ref 98–192)

## 2020-09-10 LAB — VITAMIN B12: Vitamin B-12: 134 pg/mL — ABNORMAL LOW (ref 180–914)

## 2020-09-10 MED ORDER — GABAPENTIN 100 MG PO CAPS: ORAL_CAPSULE | ORAL | 6 refills | Status: DC

## 2020-09-10 MED ORDER — CYANOCOBALAMIN 1000 MCG/ML IJ SOLN
1000.0000 ug | Freq: Once | INTRAMUSCULAR | Status: AC
  Administered 2020-09-10: 1000 ug via INTRAMUSCULAR
  Filled 2020-09-10: qty 1

## 2020-09-10 NOTE — Patient Instructions (Signed)
[facility] at [facility] Discharge Instructions  You were seen today by Dr. Delton Coombes. He went over your recent results. You received a vitamin B12 injection today. Purchase vitamin B12 over the counter and take 1 mg daily. You will be scheduled to have a CT scan of your chest before your next visit. Dr. Delton Coombes will see you back in 6 months for labs and follow up.   Thank you for choosing [place] at [facility] to provide your oncology and hematology care.  To afford each patient quality time with our provider, please arrive at least 15 minutes before your scheduled appointment time.   If you have a lab appointment with the [facility] please come in thru the Main Entrance and check in at the main information desk  You need to re-schedule your appointment should you arrive 10 or more minutes late.  We strive to give you quality time with our providers, and arriving late affects you and other patients whose appointments are after yours.  Also, if you no show three or more times for appointments you may be dismissed from the clinic at the providers discretion.     Again, thank you for choosing [facility].  Our hope is that these requests will decrease the amount of time that you wait before being seen by our physicians.       _____________________________________________________________  Should you have questions after your visit to [facility], please contact our office at [phone] between the hours of 8:00 a.m. and 4:30 p.m.  Voicemails left after 4:00 p.m. will not be returned until the following business day.  For prescription refill requests, have your pharmacy contact our office and allow 72 hours.    Cancer Center Support Programs:   > Cancer Support Group  2nd Tuesday of the month 1pm-2pm, Journey Room

## 2020-09-10 NOTE — Progress Notes (Signed)
Patient was assessed by Dr. Delton Coombes and labs have been reviewed.  Patient is okay to proceed with Vitamin B12 injection today. Primary nurse and pharmacy aware.

## 2020-09-10 NOTE — Progress Notes (Signed)
[facility] [address] [place] [address]   CLINIC:  Medical Oncology/Hematology  PCP:  Celene Squibb, MD [address] [name] [address]  [phone]  REASON FOR VISIT:  Follow-up for DLBCL  PRIOR THERAPY: R-CHOP x 6 cycles from 07/30/2016 to 11/12/2016  CURRENT THERAPY: Surveillance  INTERVAL HISTORY:  [name], a 65 y.o. female, returns for routine follow-up for her DLBCL. Labrisha was last seen on 03/05/2020.  Today she reports feeling feeling okay. She notes that her energy levels have not improved since chemo and continues being too tired to be physically active, though she is able to do her ADL's. She is not taking vitamin B12. She denies having any recent infections, F/C, night sweats, weight loss or skin rashes. She continues taking gabapentin 100 mg BID.  She denies having family history of psoriasis. She will go to see cardiology for follow-up of her MI.   REVIEW OF SYSTEMS:  Review of Systems  Constitutional: Positive for appetite change (75%) and fatigue (depleted). Negative for chills, diaphoresis, fever and unexpected weight change.  Skin: Negative for rash.  All other systems reviewed and are negative.   PAST MEDICAL/SURGICAL HISTORY:  Past Medical History:  Diagnosis Date  . Anxiety   . Arthritis   . DLBCL (diffuse large B cell lymphoma) ([place]) 07/16/2016  . GERD (gastroesophageal reflux disease)   . Heart disease   . Hyperlipidemia   . Hypertension   . Irritable bowel syndrome (IBS)    with diarrhea  . Myocardial infarction ([place]) 12/19/2009   released from cardiology   Past Surgical History:  Procedure Laterality Date  . CARDIAC CATHETERIZATION     cardiac stent  . CHOLECYSTECTOMY    . CORONARY STENT PLACEMENT  2011  . INGUINAL LYMPH NODE BIOPSY Left 07/13/2016   Procedure: EXCISIONAL BIOPSY OF LEFT INGUINAL LYMPH NODE;  Surgeon: Vickie Epley, MD;  Location: [facility];  Service: General;  Laterality:  Left;  . PORT-A-CATH REMOVAL Right 05/20/2018   Procedure: MINOR REMOVAL PORT-A-CATH;  Surgeon: Aviva Signs, MD;  Location: [facility];  Service: General;  Laterality: Right;  . PORTACATH PLACEMENT N/A 07/17/2016   Procedure: INSERTION OF TUNNELED CENTRAL VENOUS CATHETER WITH SUBCUTANEOUS PORT;  Surgeon: Vickie Epley, MD;  Location: [facility];  Service: Vascular;  Laterality: N/A;  . TUBAL LIGATION      SOCIAL HISTORY:  Social History   Socioeconomic History  . Marital status: Single    Spouse name: Not on file  . Number of children: Not on file  . Years of education: Not on file  . Highest education level: Not on file  Occupational History  . Not on file  Tobacco Use  . Smoking status: Current Every Day Smoker    Packs/day: 0.50    Years: 40.00    Pack years: 20.00    Types: Cigarettes  . Smokeless tobacco: Never Used  Substance and Sexual Activity  . Alcohol use: No  . Drug use: No  . Sexual activity: Yes    Birth control/protection: Post-menopausal  Other Topics Concern  . Not on file  Social History Narrative  . Not on file   Social Determinants of Health   Financial Resource Strain: Not on file  Food Insecurity: Not on file  Transportation Needs: Not on file  Physical Activity: Not on file  Stress: Not on file  Social Connections: Not on file  Intimate Partner Violence: Not on file  FAMILY HISTORY:  Family History  Problem Relation Age of Onset  . Cancer Mother        colon  . Depression Mother   . Heart disease Father   . Depression Sister   . Cancer Sister        leukemia  . Hyperlipidemia Brother   . Hypertension Brother     CURRENT MEDICATIONS:  Current Outpatient Medications  Medication Sig Dispense Refill  . buPROPion (WELLBUTRIN XL) 150 MG 24 hr tablet Take 150 mg by mouth daily.    . diclofenac (VOLTAREN) 75 MG EC tablet Take 75 mg by mouth 2 (two) times daily.    . fluticasone (FLONASE) 50 MCG/ACT nasal spray Place into both nostrils  daily.    . pantoprazole (PROTONIX) 40 MG tablet Take 40 mg by mouth daily. Pt takes at supper daily    . simvastatin (ZOCOR) 20 MG tablet Take 20 mg by mouth daily.    Marland Kitchen albuterol (VENTOLIN HFA) 108 (90 Base) MCG/ACT inhaler SMARTSIG:1-2 Puff(s) By Mouth Every 4-6 Hours PRN (Patient not taking: Reported on 09/10/2020)    . ALPRAZolam (XANAX) 0.5 MG tablet Take 0.25-0.5 mg by mouth daily as needed for anxiety.  (Patient not taking: Reported on 09/10/2020)    . gabapentin (NEURONTIN) 100 MG capsule TAKE 1 CAPSULE BY MOUTH 2 TIMES A DAY. 60 capsule 6   No current facility-administered medications for this visit.    ALLERGIES:  Allergies  Allergen Reactions  . Amoxicillin Nausea And Vomiting    Yeast Infections  Has patient had a PCN reaction causing immediate rash, facial/tongue/throat swelling, SOB or lightheadedness with hypotension: No Has patient had a PCN reaction causing severe rash involving mucus membranes or skin necrosis: No Has patient had a PCN reaction that required hospitalization No Has patient had a PCN reaction occurring within the last 10 years: No If all of the above answers are "NO", then may proceed with Cephalosporin use.   Marland Kitchen Choline Fenofibrate     REACTION: Abd pain and elevated Lipase  . Penicillins     Yeast infections, thrush  Has patient had a PCN reaction causing immediate rash, facial/tongue/throat swelling, SOB or lightheadedness with hypotension: No Has patient had a PCN reaction causing severe rash involving mucus membranes or skin necrosis: No Has patient had a PCN reaction that required hospitalization No Has patient had a PCN reaction occurring within the last 10 years: No If all of the above answers are "NO", then may proceed with Cephalosporin use.   Marland Kitchen Percocet [Oxycodone-Acetaminophen] Nausea And Vomiting    PHYSICAL EXAM:  Performance status (ECOG): 1 - Symptomatic but completely ambulatory  Vitals:   09/10/20 1151  BP: 134/61  Pulse: 83   Resp: 20  Temp: (!) 96.8 F (36 C)  SpO2: 93%   Wt Readings from Last 3 Encounters:  09/10/20 121 lb 1.6 oz (54.9 kg)  03/05/20 114 lb 9.6 oz (52 kg)  02/09/20 114 lb (51.7 kg)   Physical Exam Vitals reviewed.  Constitutional:      Appearance: Normal appearance.  Cardiovascular:     Rate and Rhythm: Normal rate and regular rhythm.     Pulses: Normal pulses.     Heart sounds: Normal heart sounds.  Pulmonary:     Effort: Pulmonary effort is normal.     Breath sounds: Normal breath sounds.  Chest:  Breasts:     Right: No axillary adenopathy or supraclavicular adenopathy.     Left: No axillary adenopathy or supraclavicular  adenopathy.    Abdominal:     Palpations: Abdomen is soft. There is no hepatomegaly, splenomegaly or mass.     Tenderness: There is no abdominal tenderness.     Hernia: No hernia is present.  Musculoskeletal:     Right lower leg: No edema.     Left lower leg: No edema.  Lymphadenopathy:     Cervical: No cervical adenopathy.     Upper Body:     Right upper body: No supraclavicular, axillary or pectoral adenopathy.     Left upper body: No supraclavicular, axillary or pectoral adenopathy.     Lower Body: No right inguinal adenopathy. No left inguinal adenopathy.  Skin:    Findings: No rash.  Neurological:     General: No focal deficit present.     Mental Status: She is alert and oriented to person, place, and time.  Psychiatric:        Mood and Affect: Mood normal.        Behavior: Behavior normal.     LABORATORY DATA:  I have reviewed the labs as listed.  CBC Latest Ref Rng & Units 09/10/2020 03/05/2020 02/20/2019  WBC 4.0 - 10.5 K/uL 5.4 6.5 5.7  Hemoglobin 12.0 - 15.0 g/dL 15.0 15.0 15.2(H)  Hematocrit 36.0 - 46.0 % 46.1(H) 46.6(H) 46.7(H)  Platelets 150 - 400 K/uL 236 218 310   CMP Latest Ref Rng & Units 09/10/2020 03/05/2020 08/25/2019  Glucose 70 - 99 mg/dL 121(H) 121(H) 102(H)  BUN 8 - 23 mg/dL 10 12 10   Creatinine 0.44 - 1.00 mg/dL 0.81  0.81 0.84  Sodium 135 - 145 mmol/L 141 139 140  Potassium 3.5 - 5.1 mmol/L 4.5 4.1 4.5  Chloride 98 - 111 mmol/L 105 101 102  CO2 22 - 32 mmol/L 27 27 28   Calcium 8.9 - 10.3 mg/dL 9.6 9.4 9.3  Total Protein 6.5 - 8.1 g/dL 6.7 6.9 6.6  Total Bilirubin 0.3 - 1.2 mg/dL 0.7 0.8 0.6  Alkaline Phos 38 - 126 U/L 82 96 83  AST 15 - 41 U/L 17 14(L) 16  ALT 0 - 44 U/L 14 13 20       Component Value Date/Time   RBC 4.71 09/10/2020 1038   MCV 97.9 09/10/2020 1038   MCH 31.8 09/10/2020 1038   MCHC 32.5 09/10/2020 1038   RDW 13.3 09/10/2020 1038   LYMPHSABS 1.2 09/10/2020 1038   MONOABS 0.4 09/10/2020 1038   EOSABS 0.1 09/10/2020 1038   BASOSABS 0.1 09/10/2020 1038   Lab Results  Component Value Date   LDH 141 09/10/2020   LDH 127 03/05/2020   LDH 128 08/25/2019   Lab Results  Component Value Date   VD25OH 34.51 03/05/2020    DIAGNOSTIC IMAGING:  I have independently reviewed the scans and discussed with the patient. No results found.   ASSESSMENT:  1. Stage IIIb large B-cell lymphoma: -Presentation with weight loss, night sweats, adenopathy in the neck, abdomen and right inguinal area -6 cycles of R-CHOP from 07/30/2016 through 11/12/2016 -Denies fevers, night sweats or weight loss. Denies any recurrent infections or hospitalizations. -CT CAP on 06/21/2018 did not show any evidence of adenopathy in the chest, abdomen and pelvis. Innumerable new groundglass nodules in both lungs.  2. Left colon thickening: -Her last colonoscopy was in 1999. -Her last CT scan showed thickening of the sigmoid colon area. The current CT scan on 06/21/2018 showed improved appearance of the colon since 12/21/2017. She denies any bleeding per rectum or melena. She does  report having IBS-diarrhea.  3. 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 -We have renewed her gabapentin.   PLAN:  1. Stage IIIb large B-cell lymphoma: -Denies any B symptoms.   Physical examination today did not reveal any palpable adenopathy. -Labs reveal normal LDH and CBC. -RTC 6 months for follow-up.  2.  Smoking history: -Last CT was done in April 2020. -Recommend CT of the chest without contrast prior to next visit.  3. Neuropathy: -Continue gabapentin.  4.  B12 deficiency: -B12 is 134.  She will receive B12 injection today.  She will start B12 1 mg tablet daily.  Orders placed this encounter:  No orders of the defined types were placed in this encounter.    Derek Jack, MD [place] [phone]   I, Milinda Antis, am acting as a scribe for Dr. Sanda Linger.  I, Derek Jack MD, have reviewed the above documentation for accuracy and completeness, and I agree with the above.

## 2020-09-10 NOTE — Progress Notes (Signed)
Patient tolerated B12 injection with no complaints voiced.  Site clean and dry with no bruising or swelling noted at site.  Band aid applied.  VSS with discharge and left ambulatory with no s/s of distress noted. 

## 2020-10-15 DIAGNOSIS — E782 Mixed hyperlipidemia: Secondary | ICD-10-CM | POA: Diagnosis not present

## 2020-10-15 DIAGNOSIS — F17219 Nicotine dependence, cigarettes, with unspecified nicotine-induced disorders: Secondary | ICD-10-CM | POA: Diagnosis not present

## 2020-10-15 DIAGNOSIS — Z8572 Personal history of non-Hodgkin lymphomas: Secondary | ICD-10-CM | POA: Diagnosis not present

## 2020-10-15 DIAGNOSIS — I10 Essential (primary) hypertension: Secondary | ICD-10-CM | POA: Diagnosis not present

## 2020-10-15 DIAGNOSIS — M545 Low back pain, unspecified: Secondary | ICD-10-CM | POA: Diagnosis not present

## 2020-10-15 DIAGNOSIS — K219 Gastro-esophageal reflux disease without esophagitis: Secondary | ICD-10-CM | POA: Diagnosis not present

## 2020-10-15 DIAGNOSIS — C8330 Diffuse large B-cell lymphoma, unspecified site: Secondary | ICD-10-CM | POA: Diagnosis not present

## 2020-10-15 DIAGNOSIS — G620 Drug-induced polyneuropathy: Secondary | ICD-10-CM | POA: Diagnosis not present

## 2020-10-15 DIAGNOSIS — I2510 Atherosclerotic heart disease of native coronary artery without angina pectoris: Secondary | ICD-10-CM | POA: Diagnosis not present

## 2020-11-14 ENCOUNTER — Other Ambulatory Visit (HOSPITAL_COMMUNITY): Payer: Self-pay

## 2020-12-11 DIAGNOSIS — I10 Essential (primary) hypertension: Secondary | ICD-10-CM | POA: Diagnosis not present

## 2020-12-11 DIAGNOSIS — E782 Mixed hyperlipidemia: Secondary | ICD-10-CM | POA: Diagnosis not present

## 2020-12-18 DIAGNOSIS — C8330 Diffuse large B-cell lymphoma, unspecified site: Secondary | ICD-10-CM | POA: Diagnosis not present

## 2020-12-18 DIAGNOSIS — M5442 Lumbago with sciatica, left side: Secondary | ICD-10-CM | POA: Diagnosis not present

## 2020-12-18 DIAGNOSIS — F17219 Nicotine dependence, cigarettes, with unspecified nicotine-induced disorders: Secondary | ICD-10-CM | POA: Diagnosis not present

## 2020-12-18 DIAGNOSIS — E782 Mixed hyperlipidemia: Secondary | ICD-10-CM | POA: Diagnosis not present

## 2020-12-18 DIAGNOSIS — I2510 Atherosclerotic heart disease of native coronary artery without angina pectoris: Secondary | ICD-10-CM | POA: Diagnosis not present

## 2020-12-18 DIAGNOSIS — K219 Gastro-esophageal reflux disease without esophagitis: Secondary | ICD-10-CM | POA: Diagnosis not present

## 2020-12-18 DIAGNOSIS — I10 Essential (primary) hypertension: Secondary | ICD-10-CM | POA: Diagnosis not present

## 2020-12-18 DIAGNOSIS — K7689 Other specified diseases of liver: Secondary | ICD-10-CM | POA: Diagnosis not present

## 2020-12-18 DIAGNOSIS — Z8572 Personal history of non-Hodgkin lymphomas: Secondary | ICD-10-CM | POA: Diagnosis not present

## 2020-12-18 DIAGNOSIS — G620 Drug-induced polyneuropathy: Secondary | ICD-10-CM | POA: Diagnosis not present

## 2021-01-03 DIAGNOSIS — K7689 Other specified diseases of liver: Secondary | ICD-10-CM | POA: Diagnosis not present

## 2021-01-03 DIAGNOSIS — M5442 Lumbago with sciatica, left side: Secondary | ICD-10-CM | POA: Diagnosis not present

## 2021-01-03 DIAGNOSIS — E782 Mixed hyperlipidemia: Secondary | ICD-10-CM | POA: Diagnosis not present

## 2021-01-03 DIAGNOSIS — K219 Gastro-esophageal reflux disease without esophagitis: Secondary | ICD-10-CM | POA: Diagnosis not present

## 2021-01-03 DIAGNOSIS — R0609 Other forms of dyspnea: Secondary | ICD-10-CM | POA: Diagnosis not present

## 2021-01-23 DIAGNOSIS — M5136 Other intervertebral disc degeneration, lumbar region: Secondary | ICD-10-CM | POA: Diagnosis not present

## 2021-02-04 DIAGNOSIS — K219 Gastro-esophageal reflux disease without esophagitis: Secondary | ICD-10-CM | POA: Diagnosis not present

## 2021-02-04 DIAGNOSIS — E782 Mixed hyperlipidemia: Secondary | ICD-10-CM | POA: Diagnosis not present

## 2021-02-04 DIAGNOSIS — M5442 Lumbago with sciatica, left side: Secondary | ICD-10-CM | POA: Diagnosis not present

## 2021-02-04 DIAGNOSIS — K7689 Other specified diseases of liver: Secondary | ICD-10-CM | POA: Diagnosis not present

## 2021-02-12 ENCOUNTER — Encounter (HOSPITAL_COMMUNITY): Payer: Self-pay | Admitting: Physical Therapy

## 2021-02-12 ENCOUNTER — Other Ambulatory Visit: Payer: Self-pay

## 2021-02-12 ENCOUNTER — Ambulatory Visit (HOSPITAL_COMMUNITY): Payer: Medicare Other | Attending: Neurosurgery | Admitting: Physical Therapy

## 2021-02-12 DIAGNOSIS — M5416 Radiculopathy, lumbar region: Secondary | ICD-10-CM | POA: Insufficient documentation

## 2021-02-12 DIAGNOSIS — M545 Low back pain, unspecified: Secondary | ICD-10-CM | POA: Diagnosis not present

## 2021-02-12 NOTE — Patient Instructions (Signed)
Access Code: [email or web address] URL: [email or web address] Date: 02/12/2021 Prepared by: Josue Hector  Exercises Seated Sciatic Tensioner - 2-3 x daily - 7 x weekly - 2 sets - 10 reps Seated Hamstring Stretch - 2-3 x daily - 7 x weekly - 1 sets - 5 reps - 20 second hold

## 2021-02-12 NOTE — Therapy (Signed)
[facility] [place], [place], [address] Phone: [phone]   Fax:  [phone]  Physical Therapy Evaluation  Patient Details  Name: Gabriella Love MRN: [id number] Date of Birth: 1956-04-23 Referring Provider (PT): Leonie Green NP   Encounter Date: 02/12/2021   PT End of Session - 02/12/21 1018     Visit Number 1    Number of Visits 8    Date for PT Re-Evaluation 03/12/21    Authorization Type UHC Medicare/ Medicaid 2ndary    PT Start Time 0945    PT Stop Time 1025    PT Time Calculation (min) 40 min    Activity Tolerance Patient tolerated treatment well    Behavior During Therapy [facility] for tasks assessed/performed             Past Medical History:  Diagnosis Date   Anxiety    Arthritis    DLBCL (diffuse large B cell lymphoma) ([place]) 07/16/2016   GERD (gastroesophageal reflux disease)    Heart disease    Hyperlipidemia    Hypertension    Irritable bowel syndrome (IBS)    with diarrhea   Myocardial infarction ([place]) 12/19/2009   released from cardiology    Past Surgical History:  Procedure Laterality Date   CARDIAC CATHETERIZATION     cardiac stent   [place] Left 07/13/2016   Procedure: EXCISIONAL BIOPSY OF LEFT INGUINAL LYMPH NODE;  Surgeon: Vickie Epley, MD;  Location: [facility];  Service: General;  Laterality: Left;   PORT-A-CATH REMOVAL Right 05/20/2018   Procedure: MINOR REMOVAL PORT-A-CATH;  Surgeon: Aviva Signs, MD;  Location: [facility];  Service: General;  Laterality: Right;   PORTACATH PLACEMENT N/A 07/17/2016   Procedure: INSERTION OF TUNNELED CENTRAL VENOUS CATHETER WITH SUBCUTANEOUS PORT;  Surgeon: Vickie Epley, MD;  Location: [facility];  Service: Vascular;  Laterality: N/A;   TUBAL LIGATION      There were no vitals filed for this visit.    Subjective Assessment - 02/12/21 0951     Subjective Patient presents to  therapy with complaint of back pain. She says its her sciatic nerve. She has tried some medications which have helped a little but still having trouble. She is wanting to try some exercise for further improvement. Worse in morning, LT leg very stiff, difficult to put weight on initially.    Limitations Lifting;Standing;Walking;House hold activities    How long can you stand comfortably? 5 minutes    How long can you walk comfortably? 5 minutes    Patient Stated Goals Get rid of the pain and be able to walk again    Currently in Pain? Yes    Pain Score 3     Pain Location Back    Pain Orientation Left;Posterior    Pain Descriptors / Indicators Dull;Throbbing    Pain Type Chronic pain    Pain Radiating Towards LT foot    Pain Onset More than a month ago    Pain Frequency Constant    Aggravating Factors  standing, walking    Pain Relieving Factors Meds, asper cream    Effect of Pain on Daily Activities Limits                [facility] PT Assessment - 02/12/21 0001       Assessment   Medical Diagnosis LBP    Referring Provider (PT) Leonie Green NP  Prior Therapy Yes      Balance Screen   Has the patient fallen in the past 6 months No      [place] residence      Prior Function   Level of Independence Independent      Cognition   Overall Cognitive Status Within Functional Limits for tasks assessed      Observation/Other Assessments   Focus on Therapeutic Outcomes (FOTO)  40% function      Posture/Postural Control   Posture/Postural Control Postural limitations    Postural Limitations Decreased lumbar lordosis      ROM / Strength   AROM / PROM / Strength AROM;Strength      AROM   AROM Assessment Site Lumbar    Lumbar Flexion 50% limited    Lumbar Extension 90% limited    Lumbar - Right Side Bend WNL    Lumbar - Left Side Bend WNL      Strength   Strength Assessment Site Hip;Knee;Ankle    Right/Left Hip Right;Left    Right Hip  Flexion 5/5    Right Hip Extension 3-/5    Right Hip ABduction 4+/5    Left Hip Flexion 4/5    Left Hip Extension 3-/5    Left Hip ABduction 3+/5    Right/Left Knee Right;Left    Right Knee Extension 5/5    Left Knee Extension 4/5    Right/Left Ankle Right;Left    Right Ankle Dorsiflexion 5/5    Left Ankle Dorsiflexion 5/5      Palpation   Palpation comment Min/mod TTP about LT lateral glute/ hamstring insertion      Special Tests   Other special tests (+) slump test on LT                        Objective measurements completed on examination: See above findings.       [facility] Adult PT Treatment/Exercise - 02/12/21 0001       Exercises   Exercises Lumbar      Lumbar Exercises: Stretches   Passive Hamstring Stretch Left;2 reps;30 seconds    Other Lumbar Stretch Exercise seated slump slides x5                    PT Education - 02/12/21 0954     Education Details on evaluation findings, POC and HEP    Person(s) Educated Patient    Methods Explanation;Handout    Comprehension Verbalized understanding              PT Short Term Goals - 02/12/21 1027       PT SHORT TERM GOAL #1   Title Patient will be independent with initial HEP and self-management strategies to improve functional outcomes    Time 2    Period Weeks    Status New    Target Date 02/26/21               PT Long Term Goals - 02/12/21 1448       PT LONG TERM GOAL #1   Title Patient will improve FOTO score to predicted value to indicate improvement in functional outcomes    Time 4    Period Weeks    Status New    Target Date 03/12/21      PT LONG TERM GOAL #2   Title Patient will report at least 75% overall improvement in subjective complaint to indicate improvement  in ability to perform ADLs.    Time 4    Period Weeks    Status New    Target Date 03/12/21      PT LONG TERM GOAL #3   Title Patient will have equal to or > 4/5 MMT throughout BLE to improve  ability to perform functional mobility, stair ambulation and ADLs.    Time 4    Period Weeks    Status New    Target Date 03/12/21                    Plan - 02/12/21 1026     Clinical Impression Statement Patient is a 65 y.o. female who presents to physical therapy with complaint of LBP. Patient demonstrates decreased strength, ROM restriction, increased tenderness to palpation and postural abnormalities which are likely contributing to symptoms of pain and are negatively impacting patient ability to perform ADLs and functional mobility tasks. Patient will benefit from skilled physical therapy services to address these deficits to reduce pain, improve level of function with ADLs and functional mobility tasks.    Examination-Activity Limitations Squat;Stand;Locomotion Level;Transfers    Examination-Participation Restrictions Laundry;Yard Work;Community Activity;Cleaning    Stability/Clinical Decision Making Stable/Uncomplicated    Clinical Decision Making Low    Rehab Potential Good    PT Frequency 2x / week    PT Duration 4 weeks    PT Treatment/Interventions ADLs/Self Care Home Management;Aquatic Therapy;Biofeedback;Cryotherapy;Gait training;Stair training;Functional mobility training;Electrical Stimulation;Iontophoresis 4mg /ml Dexamethasone;Therapeutic activities;Manual techniques;Therapeutic exercise;Moist Heat;Traction;Balance training;Manual lymph drainage;Vasopneumatic Device;Taping;Splinting;Energy conservation;Orthotic Fit/Training;Patient/family education;Passive range of motion;Dry needling;Joint Manipulations;Spinal Manipulations;Scar mobilization;Compression bandaging;Neuromuscular re-education;Ultrasound;Parrafin;Fluidtherapy;Contrast Bath;DME Instruction;Visual/perceptual remediation/compensation    PT Next Visit Plan Review goals and HEP. Progress hip and core strength as tolerated. Issue weekly HEP. Manual STM to LT lumbar/ hip musculature as indicated.    PT Home  Exercise Plan Eval: slump slider, seated HS stretch    Consulted and Agree with Plan of Care Patient             Patient will benefit from skilled therapeutic intervention in order to improve the following deficits and impairments:  Decreased activity tolerance, Decreased strength, Increased fascial restricitons, Pain, Impaired flexibility, Postural dysfunction, Improper body mechanics, Decreased range of motion, Decreased mobility  Visit Diagnosis: Low back pain, unspecified back pain laterality, unspecified chronicity, unspecified whether sciatica present  Radiculopathy, lumbar region     Problem List Patient Active Problem List   Diagnosis Date Noted   Lymphoma, large-cell, diffuse ([place]) 07/16/2016   Cervicitis 08/30/2013   Vaginitis and vulvovaginitis, unspecified 08/15/2013   Nausea 11/21/2011   Abdominal pain 03/26/2011   Acid reflux 02/26/2011   Abdominal pain, epigastric 02/26/2011   BRONCHITIS, CHRONIC 05/14/2010   NEVUS 04/08/2010   EUSTACHIAN TUBE DYSFUNCTION 02/13/2010   HYPERTENSION 01/16/2010   CORONARY ARTERY DISEASE 01/16/2010   ECCHYMOSES, SPONTANEOUS 01/16/2010   HYPERLIPIDEMIA 12/30/2007   ANXIETY DEPRESSION 12/30/2007   TOBACCO ABUSE 12/02/2007   MITRAL VALVE PROLAPSE 12/02/2007   ALLERGIC RHINITIS 12/02/2007   IBS 12/02/2007   2:54 PM, 02/12/21 Josue Hector PT DPT  Physical Therapist with [facility]  [phone], [place], [address] Phone: [phone]   Fax:  [phone]  Name: DINITA MIGLIACCIO MRN: [id number] Date of Birth: 05/30/1956

## 2021-02-21 ENCOUNTER — Other Ambulatory Visit: Payer: Self-pay

## 2021-02-21 ENCOUNTER — Ambulatory Visit (HOSPITAL_COMMUNITY): Payer: Medicare Other | Admitting: Physical Therapy

## 2021-02-21 DIAGNOSIS — M5416 Radiculopathy, lumbar region: Secondary | ICD-10-CM | POA: Diagnosis not present

## 2021-02-21 DIAGNOSIS — M545 Low back pain, unspecified: Secondary | ICD-10-CM | POA: Diagnosis not present

## 2021-02-21 NOTE — Therapy (Signed)
[place], [place], [address] Phone: [phone]   Fax:  [phone]  Physical Therapy Treatment  Patient Details  Name: Gabriella Love MRN: [id number] Date of Birth: May 16, 1956 Referring Provider (PT): Leonie Green NP   Encounter Date: 02/21/2021   PT End of Session - 02/21/21 0954     Visit Number 2    Number of Visits 8    Date for PT Re-Evaluation 03/12/21    Authorization Type UHC Medicare/ Medicaid 2ndary    PT Start Time 0958    PT Stop Time 1040    PT Time Calculation (min) 42 min    Activity Tolerance Patient tolerated treatment well    Behavior During Therapy [facility] for tasks assessed/performed             Past Medical History:  Diagnosis Date   Anxiety    Arthritis    DLBCL (diffuse large B cell lymphoma) ([place]) 07/16/2016   GERD (gastroesophageal reflux disease)    Heart disease    Hyperlipidemia    Hypertension    Irritable bowel syndrome (IBS)    with diarrhea   Myocardial infarction ([place]) 12/19/2009   released from cardiology    Past Surgical History:  Procedure Laterality Date   CARDIAC CATHETERIZATION     cardiac stent   [place] Left 07/13/2016   Procedure: EXCISIONAL BIOPSY OF LEFT INGUINAL LYMPH NODE;  Surgeon: Vickie Epley, MD;  Location: [facility];  Service: General;  Laterality: Left;   PORT-A-CATH REMOVAL Right 05/20/2018   Procedure: MINOR REMOVAL PORT-A-CATH;  Surgeon: Aviva Signs, MD;  Location: [facility];  Service: General;  Laterality: Right;   PORTACATH PLACEMENT N/A 07/17/2016   Procedure: INSERTION OF TUNNELED CENTRAL VENOUS CATHETER WITH SUBCUTANEOUS PORT;  Surgeon: Vickie Epley, MD;  Location: [facility];  Service: Vascular;  Laterality: N/A;   TUBAL LIGATION      There were no vitals filed for this visit.   Subjective Assessment - 02/21/21 0954     Subjective Pt states that she has  completed her exercises and she has really noted an improvement.    Limitations Lifting;Standing;Walking;House hold activities    How long can you stand comfortably? 5 minutes    How long can you walk comfortably? 5 minutes    Patient Stated Goals Get rid of the pain and be able to walk again    Currently in Pain? Yes    Pain Score 2     Pain Location Buttocks    Pain Orientation Left    Pain Descriptors / Indicators Throbbing;Dull    Pain Type Chronic pain    Pain Radiating Towards foot    Pain Onset More than a month ago    Pain Frequency Constant    Aggravating Factors  WB    Pain Relieving Factors meds    Effect of Pain on Daily Activities limits                [facility] PT Assessment - 02/21/21 0001       Flexibility   Soft Tissue Assessment /Muscle Length yes    Hamstrings LT 120; RT 158                           [facility] Adult PT Treatment/Exercise - 02/21/21 0001       Lumbar  Exercises: Stretches   Active Hamstring Stretch Left;Right;3 reps;20 seconds    Single Knee to Chest Stretch Right;Left;3 reps;20 seconds    Prone on Elbows Stretch 1 rep;60 seconds    Piriformis Stretch Left;Right;1 rep;60 seconds    Piriformis Stretch Limitations quadriped      Lumbar Exercises: Supine   Bridge 10 reps      Lumbar Exercises: Sidelying   Clam Both;10 reps    Hip Abduction Both;10 reps      Manual Therapy   Manual Therapy Soft tissue mobilization;Muscle Energy Technique    Manual therapy comments done seperate from all other asspects of treatment    Soft tissue mobilization to relax spinal paraspinal mm    Muscle Energy Technique to correct SI dysfunctrion                      PT Short Term Goals - 02/21/21 1044       PT SHORT TERM GOAL #1   Title Patient will be independent with initial HEP and self-management strategies to improve functional outcomes    Time 2    Period Weeks    Status On-going    Target Date 02/26/21                PT Long Term Goals - 02/21/21 1044       PT LONG TERM GOAL #1   Title Patient will improve FOTO score to predicted value to indicate improvement in functional outcomes    Time 4    Period Weeks    Status On-going      PT LONG TERM GOAL #2   Title Patient will report at least 75% overall improvement in subjective complaint to indicate improvement in ability to perform ADLs.    Time 4    Period Weeks    Status On-going      PT LONG TERM GOAL #3   Title Patient will have equal to or > 4/5 MMT throughout BLE to improve ability to perform functional mobility, stair ambulation and ADLs.    Time 4    Period Weeks    Status On-going                   Plan - 02/21/21 1018     Clinical Impression Statement Evaluation and Goals reviewed with pt.  Pt has responded favorably to treatment so far.  Noted SI dysfunction with favorable response to mm energy techniques.    Pt treatment today focused on continueing with stretches as well as beginning gentle stabilization exercises.  PT noted to have tight paraspinal mm with manual    Examination-Activity Limitations Squat;Stand;Locomotion Level;Transfers    Examination-Participation Restrictions Laundry;Yard Work;Community Activity;Cleaning    Stability/Clinical Decision Making Stable/Uncomplicated    Rehab Potential Good    PT Frequency 2x / week    PT Duration 4 weeks    PT Treatment/Interventions ADLs/Self Care Home Management;Aquatic Therapy;Biofeedback;Cryotherapy;Gait training;Stair training;Functional mobility training;Electrical Stimulation;Iontophoresis '4mg'$ /ml Dexamethasone;Therapeutic activities;Manual techniques;Therapeutic exercise;Moist Heat;Traction;Balance training;Manual lymph drainage;Vasopneumatic Device;Taping;Splinting;Energy conservation;Orthotic Fit/Training;Patient/family education;Passive range of motion;Dry needling;Joint Manipulations;Spinal Manipulations;Scar mobilization;Compression bandaging;Neuromuscular  re-education;Ultrasound;Parrafin;Fluidtherapy;Contrast Bath;DME Instruction;Visual/perceptual remediation/compensation    PT Next Visit Plan Progress hip and core strength as tolerated. Issue weekly HEP. Manual STM to LT lumbar/ hip musculature as indicated.    PT Home Exercise Plan Eval: slump slider, seated HS stretch    Consulted and Agree with Plan of Care Patient             Patient will  benefit from skilled therapeutic intervention in order to improve the following deficits and impairments:  Decreased activity tolerance, Decreased strength, Increased fascial restricitons, Pain, Impaired flexibility, Postural dysfunction, Improper body mechanics, Decreased range of motion, Decreased mobility  Visit Diagnosis: Low back pain, unspecified back pain laterality, unspecified chronicity, unspecified whether sciatica present  Radiculopathy, lumbar region     Problem List Patient Active Problem List   Diagnosis Date Noted   Lymphoma, large-cell, diffuse ([place]) 07/16/2016   Cervicitis 08/30/2013   Vaginitis and vulvovaginitis, unspecified 08/15/2013   Nausea 11/21/2011   Abdominal pain 03/26/2011   Acid reflux 02/26/2011   Abdominal pain, epigastric 02/26/2011   BRONCHITIS, CHRONIC 05/14/2010   NEVUS 04/08/2010   EUSTACHIAN TUBE DYSFUNCTION 02/13/2010   HYPERTENSION 01/16/2010   CORONARY ARTERY DISEASE 01/16/2010   ECCHYMOSES, SPONTANEOUS 01/16/2010   HYPERLIPIDEMIA 12/30/2007   ANXIETY DEPRESSION 12/30/2007   TOBACCO ABUSE 12/02/2007   MITRAL VALVE PROLAPSE 12/02/2007   ALLERGIC RHINITIS 12/02/2007   IBS 12/02/2007    Rayetta Humphrey, PT CLT [phone]  02/21/2021, 10:45 AM  [place], [place], [address] Phone: [phone]   Fax:  [phone]  Name: MARYAH FROHN MRN: [id number] Date of Birth: May 03, 1956

## 2021-02-26 ENCOUNTER — Ambulatory Visit (HOSPITAL_COMMUNITY): Payer: Medicare Other | Attending: Neurosurgery | Admitting: Physical Therapy

## 2021-02-26 ENCOUNTER — Other Ambulatory Visit: Payer: Self-pay

## 2021-02-26 DIAGNOSIS — M545 Low back pain, unspecified: Secondary | ICD-10-CM | POA: Diagnosis not present

## 2021-02-26 DIAGNOSIS — M5416 Radiculopathy, lumbar region: Secondary | ICD-10-CM | POA: Insufficient documentation

## 2021-02-26 NOTE — Therapy (Signed)
[place], [place], [address] Phone: [phone]   Fax:  [phone]  Physical Therapy Treatment  Patient Details  Name: Gabriella Love MRN: [id number] Date of Birth: [birth year] Referring Provider (PT): Leonie Green NP   Encounter Date: 02/26/2021   PT End of Session - 02/26/21 1047     Visit Number 3    Number of Visits 8    Date for PT Re-Evaluation 03/12/21    Authorization Type UHC Medicare/ Medicaid 2ndary    PT Start Time 0832    PT Stop Time 0918    PT Time Calculation (min) 46 min    Activity Tolerance Patient tolerated treatment well    Behavior During Therapy [facility] for tasks assessed/performed             Past Medical History:  Diagnosis Date   Anxiety    Arthritis    DLBCL (diffuse large B cell lymphoma) ([place]) 07/16/2016   GERD (gastroesophageal reflux disease)    Heart disease    Hyperlipidemia    Hypertension    Irritable bowel syndrome (IBS)    with diarrhea   Myocardial infarction ([place]) 12/19/2009   released from cardiology    Past Surgical History:  Procedure Laterality Date   CARDIAC CATHETERIZATION     cardiac stent   [place] Left 07/13/2016   Procedure: EXCISIONAL BIOPSY OF LEFT INGUINAL LYMPH NODE;  Surgeon: Vickie Epley, MD;  Location: [facility];  Service: General;  Laterality: Left;   PORT-A-CATH REMOVAL Right 05/20/2018   Procedure: MINOR REMOVAL PORT-A-CATH;  Surgeon: Aviva Signs, MD;  Location: [facility];  Service: General;  Laterality: Right;   PORTACATH PLACEMENT N/A 07/17/2016   Procedure: INSERTION OF TUNNELED CENTRAL VENOUS CATHETER WITH SUBCUTANEOUS PORT;  Surgeon: Vickie Epley, MD;  Location: [facility];  Service: Vascular;  Laterality: N/A;   TUBAL LIGATION      There were no vitals filed for this visit.   Subjective Assessment - 02/26/21 0843     Subjective Pt states she has continued  to improve.  Pain is 4/10 currently in Lt buttock only.  No radiculopathy today to her calf area.    Currently in Pain? Yes    Pain Score 4     Pain Location Buttocks    Pain Orientation Left    Pain Descriptors / Indicators Aching;Throbbing                               [facility] Adult PT Treatment/Exercise - 02/26/21 0001       Lumbar Exercises: Stretches   Active Hamstring Stretch Left;1 rep;60 seconds    Active Hamstring Stretch Limitations long sitting    Single Knee to Chest Stretch Right;Left;3 reps;20 seconds    Prone on Elbows Stretch 1 rep;60 seconds    Piriformis Stretch Left;1 rep;60 seconds    Piriformis Stretch Limitations seated      Lumbar Exercises: Supine   Bridge 10 reps    Straight Leg Raise 10 reps      Lumbar Exercises: Sidelying   Clam Both;10 reps    Hip Abduction Both;10 reps      Lumbar Exercises: Prone   Other Prone Lumbar Exercises heelsqueeze 10X5" hlds      Manual Therapy   Manual Therapy Soft tissue mobilization;Muscle Energy Technique  Manual therapy comments done seperate from all other asspects of treatment    Soft tissue mobilization to relax spinal paraspinal mm    Muscle Energy Technique not needed today                      PT Short Term Goals - 02/21/21 1044       PT SHORT TERM GOAL #1   Title Patient will be independent with initial HEP and self-management strategies to improve functional outcomes    Time 2    Period Weeks    Status On-going    Target Date 02/26/21               PT Long Term Goals - 02/21/21 1044       PT LONG TERM GOAL #1   Title Patient will improve FOTO score to predicted value to indicate improvement in functional outcomes    Time 4    Period Weeks    Status On-going      PT LONG TERM GOAL #2   Title Patient will report at least 75% overall improvement in subjective complaint to indicate improvement in ability to perform ADLs.    Time 4    Period Weeks     Status On-going      PT LONG TERM GOAL #3   Title Patient will have equal to or > 4/5 MMT throughout BLE to improve ability to perform functional mobility, stair ambulation and ADLs.    Time 4    Period Weeks    Status On-going                   Plan - 02/26/21 1039     Clinical Impression Statement Began session with check of SIJ.  Alignment was good without need for muscle energy technique.  Did complete soft tissue massage to Lt paraspinals as these were extremely tight with multiple spasms along mm.  Added prone hip squeeze and shown alternative stretches for hamstring and piriformis mm in sitting as pt appears to be straining in supine and quadruped.  Extremely tight hamstring mm with inability to sit fully in neutral.  Pt reported overall improvement thus far with therapy and each day following with new activities    PT Next Visit Plan Progress hip and core strength as tolerated. Issue weekly HEP. Manual STM to LT lumbar/ hip musculature as indicated.             Patient will benefit from skilled therapeutic intervention in order to improve the following deficits and impairments:  Decreased activity tolerance, Decreased strength, Increased fascial restricitons, Pain, Impaired flexibility, Postural dysfunction, Improper body mechanics, Decreased range of motion, Decreased mobility  Visit Diagnosis: Low back pain, unspecified back pain laterality, unspecified chronicity, unspecified whether sciatica present  Radiculopathy, lumbar region     Problem List Patient Active Problem List   Diagnosis Date Noted   Lymphoma, large-cell, diffuse ([place]) 07/16/2016   Cervicitis 08/30/2013   Vaginitis and vulvovaginitis, unspecified 08/15/2013   Nausea 11/21/2011   Abdominal pain 03/26/2011   Acid reflux 02/26/2011   Abdominal pain, epigastric 02/26/2011   BRONCHITIS, CHRONIC 05/14/2010   NEVUS 04/08/2010   EUSTACHIAN TUBE DYSFUNCTION 02/13/2010   HYPERTENSION 01/16/2010    CORONARY ARTERY DISEASE 01/16/2010   ECCHYMOSES, SPONTANEOUS 01/16/2010   HYPERLIPIDEMIA 12/30/2007   ANXIETY DEPRESSION 12/30/2007   TOBACCO ABUSE 12/02/2007   MITRAL VALVE PROLAPSE 12/02/2007   ALLERGIC RHINITIS 12/02/2007   IBS 12/02/2007  Teena Irani, PTA/CLT [phone]  Teena Irani 02/26/2021, 10:47 AM  [place], [place], [address] Phone: [phone]   Fax:  [phone]  Name: Gabriella Love MRN: [id number] Date of Birth: March 20, 1956

## 2021-03-05 ENCOUNTER — Ambulatory Visit (HOSPITAL_COMMUNITY): Payer: Medicare Other | Admitting: Physical Therapy

## 2021-03-05 ENCOUNTER — Other Ambulatory Visit: Payer: Self-pay

## 2021-03-05 DIAGNOSIS — M5416 Radiculopathy, lumbar region: Secondary | ICD-10-CM | POA: Diagnosis not present

## 2021-03-05 DIAGNOSIS — M545 Low back pain, unspecified: Secondary | ICD-10-CM

## 2021-03-05 NOTE — Therapy (Signed)
[place], [place], [address] Phone: [phone]   Fax:  [phone]  Physical Therapy Treatment  Patient Details  Name: Gabriella Love MRN: [id number] Date of Birth: 1956-02-16 Referring Provider (PT): Leonie Green NP   Encounter Date: 03/05/2021   PT End of Session - 03/05/21 0905     Visit Number 4    Number of Visits 8    Date for PT Re-Evaluation 03/12/21    Authorization Type UHC Medicare/ Medicaid 2ndary    PT Start Time 0830    PT Stop Time 0910    PT Time Calculation (min) 40 min    Activity Tolerance Patient tolerated treatment well    Behavior During Therapy [facility] for tasks assessed/performed             Past Medical History:  Diagnosis Date   Anxiety    Arthritis    DLBCL (diffuse large B cell lymphoma) ([place]) 07/16/2016   GERD (gastroesophageal reflux disease)    Heart disease    Hyperlipidemia    Hypertension    Irritable bowel syndrome (IBS)    with diarrhea   Myocardial infarction ([place]) 12/19/2009   released from cardiology    Past Surgical History:  Procedure Laterality Date   CARDIAC CATHETERIZATION     cardiac stent   [place] Left 07/13/2016   Procedure: EXCISIONAL BIOPSY OF LEFT INGUINAL LYMPH NODE;  Surgeon: Vickie Epley, MD;  Location: [facility];  Service: General;  Laterality: Left;   PORT-A-CATH REMOVAL Right 05/20/2018   Procedure: MINOR REMOVAL PORT-A-CATH;  Surgeon: Aviva Signs, MD;  Location: [facility];  Service: General;  Laterality: Right;   PORTACATH PLACEMENT N/A 07/17/2016   Procedure: INSERTION OF TUNNELED CENTRAL VENOUS CATHETER WITH SUBCUTANEOUS PORT;  Surgeon: Vickie Epley, MD;  Location: [facility];  Service: Vascular;  Laterality: N/A;   TUBAL LIGATION      There were no vitals filed for this visit.   Subjective Assessment - 03/05/21 0832     Subjective Pt states that she is much  better she is not having any pain today    Limitations Lifting;Standing;Walking;House hold activities    How long can you stand comfortably? 5 minutes    How long can you walk comfortably? 5 minutes    Patient Stated Goals Get rid of the pain and be able to walk again    Currently in Pain? No/denies    Pain Onset More than a month ago                               [facility] Adult PT Treatment/Exercise - 03/05/21 0001       Lumbar Exercises: Stretches   Active Hamstring Stretch Left;1 rep;60 seconds    Single Knee to Chest Stretch Right;Left;3 reps;20 seconds    Prone on Elbows Stretch 1 rep;60 seconds    Piriformis Stretch Left;Right;1 rep;60 seconds    Piriformis Stretch Limitations quadriped    Other Lumbar Stretch Exercise child's pose      Lumbar Exercises: Standing   Functional Squats 10 reps      Lumbar Exercises: Supine   Bridge --    Bridge with [employer] 15 reps      Lumbar Exercises: Sidelying   Clam Both;10 reps    Hip Abduction Both;10 reps  Lumbar Exercises: Prone   Other Prone Lumbar Exercises heelsqueeze 10X5" hlds      Lumbar Exercises: Quadruped   [place] 5 reps    Opposite Arm/Leg Raise Left arm/Right leg;Right arm/Left leg;5 reps                      PT Short Term Goals - 02/21/21 1044       PT SHORT TERM GOAL #1   Title Patient will be independent with initial HEP and self-management strategies to improve functional outcomes    Time 2    Period Weeks    Status On-going    Target Date 02/26/21               PT Long Term Goals - 02/21/21 1044       PT LONG TERM GOAL #1   Title Patient will improve FOTO score to predicted value to indicate improvement in functional outcomes    Time 4    Period Weeks    Status On-going      PT LONG TERM GOAL #2   Title Patient will report at least 75% overall improvement in subjective complaint to indicate improvement in ability to perform ADLs.    Time 4     Period Weeks    Status On-going      PT LONG TERM GOAL #3   Title Patient will have equal to or > 4/5 MMT throughout BLE to improve ability to perform functional mobility, stair ambulation and ADLs.    Time 4    Period Weeks    Status On-going                   Plan - 03/05/21 [id number]     Clinical Impression Statement Added higher level stabilization exercises to pt, updated HEP.  PT continues to improve with no pain noted today.  Pt maybe ready for discharge next week if she stays pain free.    Examination-Activity Limitations Squat;Stand;Locomotion Level;Transfers    Examination-Participation Restrictions Laundry;Yard Work;Community Activity;Cleaning    Stability/Clinical Decision Making Stable/Uncomplicated    Rehab Potential Good    PT Frequency 2x / week    PT Duration 4 weeks    PT Treatment/Interventions ADLs/Self Care Home Management;Aquatic Therapy;Biofeedback;Cryotherapy;Gait training;Stair training;Functional mobility training;Electrical Stimulation;Iontophoresis '4mg'$ /ml Dexamethasone;Therapeutic activities;Manual techniques;Therapeutic exercise;Moist Heat;Traction;Balance training;Manual lymph drainage;Vasopneumatic Device;Taping;Splinting;Energy conservation;Orthotic Fit/Training;Patient/family education;Passive range of motion;Dry needling;Joint Manipulations;Spinal Manipulations;Scar mobilization;Compression bandaging;Neuromuscular re-education;Ultrasound;Parrafin;Fluidtherapy;Contrast Bath;DME Instruction;Visual/perceptual remediation/compensation    PT Next Visit Plan Assess to see if pt feels ready for discharge.  Progress hip and core strength as tolerated. . Manual STM to LT lumbar/ hip musculature as indicated.    PT Home Exercise Plan Eval: slump slider, seated HS stretch8/10:  standing extension, sitting and quadriped piriformis stretch, quadriped opposite arm/leg raise, cat/camel and childs pose, heel squeeze             Patient will benefit from skilled  therapeutic intervention in order to improve the following deficits and impairments:  Decreased activity tolerance, Decreased strength, Increased fascial restricitons, Pain, Impaired flexibility, Postural dysfunction, Improper body mechanics, Decreased range of motion, Decreased mobility  Visit Diagnosis: Low back pain, unspecified back pain laterality, unspecified chronicity, unspecified whether sciatica present  Radiculopathy, lumbar region     Problem List Patient Active Problem List   Diagnosis Date Noted   Lymphoma, large-cell, diffuse ([place]) 07/16/2016   Cervicitis 08/30/2013   Vaginitis and vulvovaginitis, unspecified 08/15/2013   Nausea 11/21/2011   Abdominal pain 03/26/2011  Acid reflux 02/26/2011   Abdominal pain, epigastric 02/26/2011   BRONCHITIS, CHRONIC 05/14/2010   NEVUS 04/08/2010   EUSTACHIAN TUBE DYSFUNCTION 02/13/2010   HYPERTENSION 01/16/2010   CORONARY ARTERY DISEASE 01/16/2010   ECCHYMOSES, SPONTANEOUS 01/16/2010   HYPERLIPIDEMIA 12/30/2007   ANXIETY DEPRESSION 12/30/2007   TOBACCO ABUSE 12/02/2007   MITRAL VALVE PROLAPSE 12/02/2007   ALLERGIC RHINITIS 12/02/2007   IBS 12/02/2007   Rayetta Humphrey, PT CLT [phone]  03/05/2021, 9:11 AM  [place], [place], [address] Phone: [phone]   Fax:  [phone]  Name: Gabriella Love MRN: [id number] Date of Birth: [birth year]

## 2021-03-06 ENCOUNTER — Ambulatory Visit (HOSPITAL_COMMUNITY)
Admission: RE | Admit: 2021-03-06 | Discharge: 2021-03-06 | Disposition: A | Discharge status: Home / Self Care | Payer: Medicare Other | Source: Ambulatory Visit | Attending: Hematology | Admitting: Hematology

## 2021-03-06 ENCOUNTER — Inpatient Hospital Stay (HOSPITAL_COMMUNITY): Payer: Medicare Other | Attending: Hematology

## 2021-03-06 DIAGNOSIS — Z806 Family history of leukemia: Secondary | ICD-10-CM | POA: Insufficient documentation

## 2021-03-06 DIAGNOSIS — Z9221 Personal history of antineoplastic chemotherapy: Secondary | ICD-10-CM | POA: Insufficient documentation

## 2021-03-06 DIAGNOSIS — C8590 Non-Hodgkin lymphoma, unspecified, unspecified site: Secondary | ICD-10-CM | POA: Diagnosis not present

## 2021-03-06 DIAGNOSIS — E538 Deficiency of other specified B group vitamins: Secondary | ICD-10-CM | POA: Diagnosis not present

## 2021-03-06 DIAGNOSIS — J439 Emphysema, unspecified: Secondary | ICD-10-CM | POA: Diagnosis not present

## 2021-03-06 DIAGNOSIS — C8330 Diffuse large B-cell lymphoma, unspecified site: Secondary | ICD-10-CM | POA: Insufficient documentation

## 2021-03-06 DIAGNOSIS — G629 Polyneuropathy, unspecified: Secondary | ICD-10-CM

## 2021-03-06 DIAGNOSIS — F17210 Nicotine dependence, cigarettes, uncomplicated: Secondary | ICD-10-CM | POA: Diagnosis not present

## 2021-03-06 DIAGNOSIS — Z79899 Other long term (current) drug therapy: Secondary | ICD-10-CM | POA: Diagnosis not present

## 2021-03-06 DIAGNOSIS — I700 Atherosclerosis of aorta: Secondary | ICD-10-CM | POA: Insufficient documentation

## 2021-03-06 DIAGNOSIS — Z800 Family history of malignant neoplasm of digestive organs: Secondary | ICD-10-CM | POA: Diagnosis not present

## 2021-03-06 DIAGNOSIS — D751 Secondary polycythemia: Secondary | ICD-10-CM | POA: Diagnosis not present

## 2021-03-06 DIAGNOSIS — G620 Drug-induced polyneuropathy: Secondary | ICD-10-CM | POA: Diagnosis not present

## 2021-03-06 DIAGNOSIS — R933 Abnormal findings on diagnostic imaging of other parts of digestive tract: Secondary | ICD-10-CM | POA: Diagnosis not present

## 2021-03-06 DIAGNOSIS — R911 Solitary pulmonary nodule: Secondary | ICD-10-CM | POA: Diagnosis not present

## 2021-03-06 DIAGNOSIS — R918 Other nonspecific abnormal finding of lung field: Secondary | ICD-10-CM | POA: Diagnosis not present

## 2021-03-06 LAB — COMPREHENSIVE METABOLIC PANEL
ALT: 13 U/L (ref 0–44)
AST: 16 U/L (ref 15–41)
Albumin: 4.5 g/dL (ref 3.5–5.0)
Alkaline Phosphatase: 84 U/L (ref 38–126)
Anion gap: 6 (ref 5–15)
BUN: 16 mg/dL (ref 8–23)
CO2: 29 mmol/L (ref 22–32)
Calcium: 9.7 mg/dL (ref 8.9–10.3)
Chloride: 103 mmol/L (ref 98–111)
Creatinine, Ser: 0.77 mg/dL (ref 0.44–1.00)
GFR, Estimated: >60 mL/min (ref >60)
Glucose, Bld: 98 mg/dL (ref 70–99)
Potassium: 4.4 mmol/L (ref 3.5–5.1)
Sodium: 138 mmol/L (ref 135–145)
Total Bilirubin: 0.7 mg/dL (ref 0.3–1.2)
Total Protein: 7.2 g/dL (ref 6.5–8.1)

## 2021-03-06 LAB — CBC WITH DIFFERENTIAL/PLATELET
Abs Immature Granulocytes: 0.02 10*3/uL (ref 0.00–0.07)
Basophils Absolute: 0.1 10*3/uL (ref 0.0–0.1)
Basophils Relative: 1 %
Eosinophils Absolute: 0 10*3/uL (ref 0.0–0.5)
Eosinophils Relative: 1 %
HCT: 48.5 % — ABNORMAL HIGH (ref 36.0–46.0)
Hemoglobin: 16 g/dL — ABNORMAL HIGH (ref 12.0–15.0)
Immature Granulocytes: 0 %
Lymphocytes Relative: 20 %
Lymphs Abs: 1.1 10*3/uL (ref 0.7–4.0)
MCH: 31.6 pg (ref 26.0–34.0)
MCHC: 33 g/dL (ref 30.0–36.0)
MCV: 95.7 fL (ref 80.0–100.0)
Monocytes Absolute: 0.5 10*3/uL (ref 0.1–1.0)
Monocytes Relative: 9 %
Neutro Abs: 4 10*3/uL (ref 1.7–7.7)
Neutrophils Relative %: 69 %
Platelets: 222 10*3/uL (ref 150–400)
RBC: 5.07 MIL/uL (ref 3.87–5.11)
RDW: 13.6 % (ref 11.5–15.5)
WBC: 5.8 10*3/uL (ref 4.0–10.5)
nRBC: 0 % (ref 0.0–0.2)

## 2021-03-06 LAB — VITAMIN B12: Vitamin B-12: 315 pg/mL (ref 180–914)

## 2021-03-06 LAB — LACTATE DEHYDROGENASE: LDH: 144 U/L (ref 98–192)

## 2021-03-06 LAB — VITAMIN D 25 HYDROXY (VIT D DEFICIENCY, FRACTURES): Vit D, 25-Hydroxy: 37.12 ng/mL (ref 30–100)

## 2021-03-11 DIAGNOSIS — M5416 Radiculopathy, lumbar region: Secondary | ICD-10-CM | POA: Diagnosis not present

## 2021-03-11 DIAGNOSIS — M5136 Other intervertebral disc degeneration, lumbar region: Secondary | ICD-10-CM | POA: Diagnosis not present

## 2021-03-12 ENCOUNTER — Ambulatory Visit (HOSPITAL_COMMUNITY): Payer: Medicare Other | Admitting: Physical Therapy

## 2021-03-12 ENCOUNTER — Other Ambulatory Visit: Payer: Self-pay

## 2021-03-12 DIAGNOSIS — M5416 Radiculopathy, lumbar region: Secondary | ICD-10-CM

## 2021-03-12 DIAGNOSIS — M545 Low back pain, unspecified: Secondary | ICD-10-CM | POA: Diagnosis not present

## 2021-03-12 NOTE — Therapy (Signed)
[facility] [address] [place], [place], [address] Phone: [phone]   Fax:  [phone]  Physical Therapy Treatment  Patient Details  Name: Gabriella Love MRN: [id number] Date of Birth: 16-Nov-1955 Referring Provider (PT): Leonie Green NP  PHYSICAL THERAPY DISCHARGE SUMMARY  Visits from Start of Care: 5  Current functional level related to goals / functional outcomes: See below    Remaining deficits: See below    Education / Equipment: See assessment   Patient agrees to discharge. Patient goals were met. Patient is being discharged due to meeting the stated rehab goals.   Encounter Date: 03/12/2021   PT End of Session - 03/12/21 1041     Visit Number 5    Number of Visits 8    Date for PT Re-Evaluation 03/12/21    Authorization Type [employer]HC Medicare/ Medicaid 2ndary    PT Start Time 1036    PT Stop Time 1114    PT Time Calculation (min) 38 min    Activity Tolerance Patient tolerated treatment well    Behavior During Therapy WFL for tasks assessed/performed             Past Medical History:  Diagnosis Date   Anxiety    Arthritis    DLBCL (diffuse large B cell lymphoma) ([place]) 07/16/2016   GERD (gastroesophageal reflux disease)    Heart disease    Hyperlipidemia    Hypertension    Irritable bowel syndrome (IBS)    with diarrhea   Myocardial infarction ([place]) 12/19/2009   released from cardiology    Past Surgical History:  Procedure Laterality Date   CARDIAC CATHETERIZATION     cardiac stent   [place] Left 07/13/2016   Procedure: EXCISIONAL BIOPSY OF LEFT INGUINAL LYMPH NODE;  Surgeon: Vickie Epley, MD;  Location: [facility];  Service: General;  Laterality: Left;   PORT-A-CATH REMOVAL Right 05/20/2018   Procedure: MINOR REMOVAL PORT-A-CATH;  Surgeon: Aviva Signs, MD;  Location: [facility];  Service: General;  Laterality: Right;   PORTACATH  PLACEMENT N/A 07/17/2016   Procedure: INSERTION OF TUNNELED CENTRAL VENOUS CATHETER WITH SUBCUTANEOUS PORT;  Surgeon: Vickie Epley, MD;  Location: [facility];  Service: Vascular;  Laterality: N/A;   TUBAL LIGATION      There were no vitals filed for this visit.   Subjective Assessment - 03/12/21 1041     Subjective Patient says she feels much better. Exercises are helping. Pain most days is a 0-1/10. Reports about 90-95% improvement since starting therapy.    Limitations Lifting;Standing;Walking;House hold activities    How long can you stand comfortably? 5 minutes    How long can you walk comfortably? 5 minutes    Patient Stated Goals Get rid of the pain and be able to walk again    Currently in Pain? No/denies    Pain Onset More than a month ago                [facility] PT Assessment - 03/12/21 0001       Assessment   Medical Diagnosis LBP    Referring Provider (PT) Leonie Green NP    Prior [place] residence      Prior Function   Level of Independence Independent      Cognition   Overall Cognitive Status Within Functional Limits  for tasks assessed      Observation/Other Assessments   Focus on Therapeutic Outcomes (FOTO)  69% function   was 40% function     Strength   Right Hip Flexion 5/5    Right Hip Extension 4+/5   was 3-   Right Hip ABduction 4+/5    Left Hip Flexion 4+/5   was 4   Left Hip Extension 4+/5   was 3-   Left Hip ABduction 4+/5   was 3+   Right Knee Extension 5/5    Left Knee Extension 5/5   was 4                          [facility] Adult PT Treatment/Exercise - 03/12/21 0001       Lumbar Exercises: Supine   Ab Set 10 reps    Bridge 10 reps                      PT Short Term Goals - 03/12/21 1112       PT SHORT TERM GOAL #1   Title Patient will be independent with initial HEP and self-management strategies to improve functional outcomes    Time 2    Period  Weeks    Status Achieved    Target Date 02/26/21               PT Long Term Goals - 03/12/21 1112       PT LONG TERM GOAL #1   Title Patient will improve FOTO score to predicted value to indicate improvement in functional outcomes    Baseline Current 69%    Time 4    Period Weeks    Status Achieved      PT LONG TERM GOAL #2   Title Patient will report at least 75% overall improvement in subjective complaint to indicate improvement in ability to perform ADLs.    Baseline Reports 90-95%    Time 4    Period Weeks    Status Achieved      PT LONG TERM GOAL #3   Title Patient will have equal to or > 4/5 MMT throughout BLE to improve ability to perform functional mobility, stair ambulation and ADLs.    Baseline See MMT    Time 4    Period Weeks    Status Achieved                   Plan - 03/12/21 1742     Clinical Impression Statement Performed reassessment per end of cert. Patient has made very good progress and has currently met all therapy goals. Reviewed comprehensive HEP and answered all patient questions. Explained lumbar spine anatomy and contribution of nerve root pathology to leg pain. Patient being DC today with all therapy goals met. Encouraged patient to follow up with therapy services with any further questions or concerns.    Examination-Activity Limitations Squat;Stand;Locomotion Level;Transfers    Examination-Participation Restrictions Laundry;Yard Work;Community Activity;Cleaning    Stability/Clinical Decision Making Stable/Uncomplicated    Rehab Potential Good    PT Treatment/Interventions ADLs/Self Care Home Management;Aquatic Therapy;Biofeedback;Cryotherapy;Gait training;Stair training;Functional mobility training;Electrical Stimulation;Iontophoresis 62m/ml Dexamethasone;Therapeutic activities;Manual techniques;Therapeutic exercise;Moist Heat;Traction;Balance training;Manual lymph drainage;Vasopneumatic Device;Taping;Splinting;Energy  conservation;Orthotic Fit/Training;Patient/family education;Passive range of motion;Dry needling;Joint Manipulations;Spinal Manipulations;Scar mobilization;Compression bandaging;Neuromuscular re-education;Ultrasound;Parrafin;Fluidtherapy;Contrast Bath;DME Instruction;Visual/perceptual remediation/compensation    PT Next Visit Plan DC to HEP    PT Home Exercise Plan Eval: slump slider, seated HS stretch8/10:  standing extension, sitting and  quadriped piriformis stretch, quadriped opposite arm/leg raise, cat/camel and childs pose, heel squeeze             Patient will benefit from skilled therapeutic intervention in order to improve the following deficits and impairments:  Decreased activity tolerance, Decreased strength, Increased fascial restricitons, Pain, Impaired flexibility, Postural dysfunction, Improper body mechanics, Decreased range of motion, Decreased mobility  Visit Diagnosis: Low back pain, unspecified back pain laterality, unspecified chronicity, unspecified whether sciatica present  Radiculopathy, lumbar region     Problem List Patient Active Problem List   Diagnosis Date Noted   Lymphoma, large-cell, diffuse ([place]) 07/16/2016   Cervicitis 08/30/2013   Vaginitis and vulvovaginitis, unspecified 08/15/2013   Nausea 11/21/2011   Abdominal pain 03/26/2011   Acid reflux 02/26/2011   Abdominal pain, epigastric 02/26/2011   BRONCHITIS, CHRONIC 05/14/2010   NEVUS 04/08/2010   EUSTACHIAN TUBE DYSFUNCTION 02/13/2010   HYPERTENSION 01/16/2010   CORONARY ARTERY DISEASE 01/16/2010   ECCHYMOSES, SPONTANEOUS 01/16/2010   HYPERLIPIDEMIA 12/30/2007   ANXIETY DEPRESSION 12/30/2007   TOBACCO ABUSE 12/02/2007   MITRAL VALVE PROLAPSE 12/02/2007   ALLERGIC RHINITIS 12/02/2007   IBS 12/02/2007   5:47 PM, 03/12/21 Josue Hector PT DPT  Physical Therapist with [facility]  ([phone] [place], [place], [address] Phone: [phone]   Fax:  [phone]  Name: Gabriella Love MRN: [id number] Date of Birth: 07-17-1956

## 2021-03-13 ENCOUNTER — Inpatient Hospital Stay (HOSPITAL_BASED_OUTPATIENT_CLINIC_OR_DEPARTMENT_OTHER): Payer: Medicare Other | Admitting: Nurse Practitioner

## 2021-03-13 ENCOUNTER — Ambulatory Visit (HOSPITAL_COMMUNITY): Payer: Medicare Other | Admitting: Hematology

## 2021-03-13 ENCOUNTER — Inpatient Hospital Stay (HOSPITAL_COMMUNITY): Payer: Medicare Other

## 2021-03-13 ENCOUNTER — Other Ambulatory Visit (HOSPITAL_COMMUNITY): Payer: Self-pay

## 2021-03-13 VITALS — BP 127/70 | HR 90 | Temp 97.0°F | Resp 18 | Wt 114.4 lb

## 2021-03-13 DIAGNOSIS — Z87891 Personal history of nicotine dependence: Secondary | ICD-10-CM | POA: Diagnosis not present

## 2021-03-13 DIAGNOSIS — D751 Secondary polycythemia: Secondary | ICD-10-CM

## 2021-03-13 DIAGNOSIS — G620 Drug-induced polyneuropathy: Secondary | ICD-10-CM | POA: Diagnosis not present

## 2021-03-13 DIAGNOSIS — K639 Disease of intestine, unspecified: Secondary | ICD-10-CM | POA: Diagnosis not present

## 2021-03-13 DIAGNOSIS — C8330 Diffuse large B-cell lymphoma, unspecified site: Secondary | ICD-10-CM

## 2021-03-13 DIAGNOSIS — Z806 Family history of leukemia: Secondary | ICD-10-CM | POA: Diagnosis not present

## 2021-03-13 DIAGNOSIS — G629 Polyneuropathy, unspecified: Secondary | ICD-10-CM | POA: Diagnosis not present

## 2021-03-13 DIAGNOSIS — I700 Atherosclerosis of aorta: Secondary | ICD-10-CM | POA: Diagnosis not present

## 2021-03-13 DIAGNOSIS — Z79899 Other long term (current) drug therapy: Secondary | ICD-10-CM | POA: Diagnosis not present

## 2021-03-13 DIAGNOSIS — E538 Deficiency of other specified B group vitamins: Secondary | ICD-10-CM | POA: Diagnosis not present

## 2021-03-13 DIAGNOSIS — R933 Abnormal findings on diagnostic imaging of other parts of digestive tract: Secondary | ICD-10-CM | POA: Diagnosis not present

## 2021-03-13 DIAGNOSIS — Z9221 Personal history of antineoplastic chemotherapy: Secondary | ICD-10-CM | POA: Diagnosis not present

## 2021-03-13 DIAGNOSIS — Z800 Family history of malignant neoplasm of digestive organs: Secondary | ICD-10-CM | POA: Diagnosis not present

## 2021-03-13 DIAGNOSIS — F17210 Nicotine dependence, cigarettes, uncomplicated: Secondary | ICD-10-CM | POA: Diagnosis not present

## 2021-03-13 LAB — IRON AND TIBC
Iron: 59 ug/dL (ref 28–170)
Saturation Ratios: 16 % (ref 10.4–31.8)
TIBC: 358 ug/dL (ref 250–450)
UIBC: 299 ug/dL

## 2021-03-13 LAB — FERRITIN: Ferritin: 44 ng/mL (ref 11–307)

## 2021-03-13 NOTE — Patient Instructions (Addendum)
[place] at [facility] Discharge Instructions  You were seen today by Beckey Rutter, NP. She discussed your recent lab work today looks fine other than your hemoglobin. She discussed you cutting back on your smoking to help your hemoglobin levels to come down. She is going to check some additional lab work to rule out any other causes for your hemoglobin being elevated. Your scan shows that your pulmonary nodules are benign (non-cancerous) at this time. We are referring you for low dose CT lung screening. Please follow up as scheduled.   Thank you for choosing [place] at [facility] to provide your oncology and hematology care.  To afford each patient quality time with our provider, please arrive at least 15 minutes before your scheduled appointment time.   If you have a lab appointment with the [facility] please come in thru the Main Entrance and check in at the main information desk.  You need to re-schedule your appointment should you arrive 10 or more minutes late.  We strive to give you quality time with our providers, and arriving late affects you and other patients whose appointments are after yours.  Also, if you no show three or more times for appointments you may be dismissed from the clinic at the providers discretion.     Again, thank you for choosing [facility].  Our hope is that these requests will decrease the amount of time that you wait before being seen by our physicians.       _____________________________________________________________  Should you have questions after your visit to [facility], please contact our office at [phone] and follow the prompts.  Our office hours are 8:00 a.m. and 4:30 p.m. Monday - Friday.  Please note that voicemails left after 4:00 p.m. may not be returned until the following business day.  We are closed weekends and major holidays.  You do have access to a nurse  24-7, just call the main number to the clinic [phone] and do not press any options, hold on the line and a nurse will answer the phone.    For prescription refill requests, have your pharmacy contact our office and allow 72 hours.    Due to Covid, you will need to wear a mask upon entering the hospital. If you do not have a mask, a mask will be given to you at the Main Entrance upon arrival. For doctor visits, patients may have 1 support person age 13 or older with them. For treatment visits, patients can not have anyone with them due to social distancing guidelines and our immunocompromised population.

## 2021-03-13 NOTE — Progress Notes (Signed)
[place], [place] [address]   Virtual Visit Progress Note  I connected with Chenoah L Barz on 03/13/21 at  9:40 AM EDT by video enabled telemedicine visit and verified that I am speaking with the correct person using two identifiers.   I discussed the limitations, risks, security and privacy concerns of performing an evaluation and management service by telemedicine and the availability of in-person appointments. I also discussed with the patient that there may be a patient responsible charge related to this service. The patient expressed understanding and agreed to proceed.   Other persons participating in the visit and their role in the encounter: RN, Patient, NP   Patient's location: [facility]  Provider's location: [place]:  Medical Oncology/Hematology  PCP:  Celene Squibb, MD [address] [place] [address]  [phone]  REASON FOR VISIT:  Follow-up for DLBCL  PRIOR THERAPY: R-CHOP x 6 cycles from 07/30/2016 to 11/12/2016  CURRENT THERAPY: Surveillance  INTERVAL HISTORY:  Ms. QUANETTA SCHLAU, a 65 y.o. female, returns for routine follow-up for her DLBCL. Denies any neurologic complaints. Denies recent fevers or illnesses. Denies any easy bleeding or bruising. No melena or hematochezia.  She has chronic fatigue which is ongoing since treatment. Reports good appetite and denies weight loss. Denies chest pain. Denies any nausea, vomiting, constipation, or diarrhea. Denies urinary complaints. Patient offers no further specific complaints today.   REVIEW OF SYSTEMS:  Review of Systems  Constitutional:  Positive for fatigue. Negative for appetite change and unexpected weight change.  HENT:   Negative for mouth sores, sore throat and trouble swallowing.   Respiratory:  Negative for chest tightness and shortness of breath.   Cardiovascular:  Negative for leg swelling.  Gastrointestinal:  Negative for abdominal pain,  constipation, diarrhea, nausea and vomiting.  Genitourinary:  Negative for bladder incontinence and dysuria.   Musculoskeletal:  Negative for flank pain and neck stiffness.  Skin:  Negative for itching, rash and wound.  Neurological:  Negative for dizziness, headaches, light-headedness and numbness.  Psychiatric/Behavioral:  Negative for confusion, depression and sleep disturbance. The patient is not nervous/anxious.     PAST MEDICAL/SURGICAL HISTORY:  Past Medical History:  Diagnosis Date   Anxiety    Arthritis    DLBCL (diffuse large B cell lymphoma) ([facility]) 07/16/2016   GERD (gastroesophageal reflux disease)    Heart disease    Hyperlipidemia    Hypertension    Irritable bowel syndrome (IBS)    with diarrhea   Myocardial infarction ([place] [facility]) 12/19/2009   released from cardiology   Past Surgical History:  Procedure Laterality Date   CARDIAC CATHETERIZATION     cardiac stent   CHOLECYSTECTOMY     CORONARY STENT PLACEMENT  2011   INGUINAL LYMPH NODE BIOPSY Left 07/13/2016   Procedure: EXCISIONAL BIOPSY OF LEFT INGUINAL LYMPH NODE;  Surgeon: Vickie Epley, MD;  Location: [facility];  Service: General;  Laterality: Left;   PORT-A-CATH REMOVAL Right 05/20/2018   Procedure: MINOR REMOVAL PORT-A-CATH;  Surgeon: Aviva Signs, MD;  Location: [facility];  Service: General;  Laterality: Right;   PORTACATH PLACEMENT N/A 07/17/2016   Procedure: INSERTION OF TUNNELED CENTRAL VENOUS CATHETER WITH SUBCUTANEOUS PORT;  Surgeon: Vickie Epley, MD;  Location: [facility];  Service: Vascular;  Laterality: N/A;   TUBAL LIGATION      SOCIAL HISTORY:  Social History   Socioeconomic History   Marital status: Single    Spouse  name: Not on file   Number of children: Not on file   Years of education: Not on file   Highest education level: Not on file  Occupational History   Not on file  Tobacco Use   Smoking status: Every Day    Packs/day: 0.50    Years: 40.00    Pack years: 20.00    Types:  Cigarettes   Smokeless tobacco: Never  Substance and Sexual Activity   Alcohol use: No   Drug use: No   Sexual activity: Yes    Birth control/protection: Post-menopausal  Other Topics Concern   Not on file  Social History Narrative   Not on file   Social Determinants of Health   Financial Resource Strain: Not on file  Food Insecurity: Not on file  Transportation Needs: Not on file  Physical Activity: Not on file  Stress: Not on file  Social Connections: Not on file  Intimate Partner Violence: Not on file    FAMILY HISTORY:  Family History  Problem Relation Age of Onset   Cancer Mother        colon   Depression Mother    Heart disease Father    Depression Sister    Cancer Sister        leukemia   Hyperlipidemia Brother    Hypertension Brother     CURRENT MEDICATIONS:  Current Outpatient Medications  Medication Sig Dispense Refill   albuterol (VENTOLIN HFA) 108 (90 Base) MCG/ACT inhaler SMARTSIG:1-2 Puff(s) By Mouth Every 4-6 Hours PRN (Patient not taking: Reported on 09/10/2020)     ALPRAZolam (XANAX) 0.5 MG tablet Take 0.25-0.5 mg by mouth daily as needed for anxiety.  (Patient not taking: Reported on 09/10/2020)     buPROPion (WELLBUTRIN XL) 150 MG 24 hr tablet Take 150 mg by mouth daily.     diclofenac (VOLTAREN) 75 MG EC tablet Take 75 mg by mouth 2 (two) times daily.     fluticasone (FLONASE) 50 MCG/ACT nasal spray Place into both nostrils daily.     gabapentin (NEURONTIN) 100 MG capsule TAKE 1 CAPSULE BY MOUTH 2 TIMES A DAY. 60 capsule 6   pantoprazole (PROTONIX) 40 MG tablet Take 40 mg by mouth daily. Pt takes at supper daily     simvastatin (ZOCOR) 20 MG tablet Take 20 mg by mouth daily.     No current facility-administered medications for this visit.    ALLERGIES:  Allergies  Allergen Reactions   Amoxicillin Nausea And Vomiting    Yeast Infections  Has patient had a PCN reaction causing immediate rash, facial/tongue/throat swelling, SOB or  lightheadedness with hypotension: No Has patient had a PCN reaction causing severe rash involving mucus membranes or skin necrosis: No Has patient had a PCN reaction that required hospitalization No Has patient had a PCN reaction occurring within the last 10 years: No If all of the above answers are "NO", then may proceed with Cephalosporin use.    Choline Fenofibrate     REACTION: Abd pain and elevated Lipase   Penicillins     Yeast infections, thrush  Has patient had a PCN reaction causing immediate rash, facial/tongue/throat swelling, SOB or lightheadedness with hypotension: No Has patient had a PCN reaction causing severe rash involving mucus membranes or skin necrosis: No Has patient had a PCN reaction that required hospitalization No Has patient had a PCN reaction occurring within the last 10 years: No If all of the above answers are "NO", then may proceed with Cephalosporin use.  Percocet [Oxycodone-Acetaminophen] Nausea And Vomiting    PHYSICAL EXAM:  Performance status (ECOG): 1 - Symptomatic but completely ambulatory  Vitals:   03/13/21 0900  BP: 127/70  Pulse: 90  Resp: 18  Temp: (!) 97 F (36.1 C)  SpO2: 98%   Wt Readings from Last 3 Encounters:  03/13/21 114 lb 6.7 oz (51.9 kg)  09/10/20 121 lb 1.6 oz (54.9 kg)  03/05/20 114 lb 9.6 oz (52 kg)   Physical Exam Vitals reviewed.  Constitutional:      Appearance: Normal appearance.  Cardiovascular:     Rate and Rhythm: Normal rate and regular rhythm.     Pulses: Normal pulses.     Heart sounds: Normal heart sounds.  Pulmonary:     Effort: Pulmonary effort is normal.     Breath sounds: Normal breath sounds.  Abdominal:     Palpations: Abdomen is soft. There is no hepatomegaly, splenomegaly or mass.     Tenderness: There is no abdominal tenderness.     Hernia: No hernia is present.  Musculoskeletal:     Right lower leg: No edema.     Left lower leg: No edema.  Lymphadenopathy:     Cervical: No cervical  adenopathy.     Upper Body:     Right upper body: No supraclavicular, axillary or pectoral adenopathy.     Left upper body: No supraclavicular, axillary or pectoral adenopathy.     Lower Body: No right inguinal adenopathy. No left inguinal adenopathy.  Skin:    Findings: No rash.  Neurological:     General: No focal deficit present.     Mental Status: She is alert and oriented to person, place, and time.  Psychiatric:        Mood and Affect: Mood normal.        Behavior: Behavior normal.    LABORATORY DATA:  I have reviewed the labs as listed.  CBC Latest Ref Rng & Units 03/06/2021 09/10/2020 03/05/2020  WBC 4.0 - 10.5 K/uL 5.8 5.4 6.5  Hemoglobin 12.0 - 15.0 g/dL 16.0(H) 15.0 15.0  Hematocrit 36.0 - 46.0 % 48.5(H) 46.1(H) 46.6(H)  Platelets 150 - 400 K/uL 222 236 218   CMP Latest Ref Rng & Units 03/06/2021 09/10/2020 03/05/2020  Glucose 70 - 99 mg/dL 98 121(H) 121(H)  BUN 8 - 23 mg/dL '16 10 12  '$ Creatinine 0.44 - 1.00 mg/dL 0.77 0.81 0.81  Sodium 135 - 145 mmol/L 138 141 139  Potassium 3.5 - 5.1 mmol/L 4.4 4.5 4.1  Chloride 98 - 111 mmol/L 103 105 101  CO2 22 - 32 mmol/L '29 27 27  '$ Calcium 8.9 - 10.3 mg/dL 9.7 9.6 9.4  Total Protein 6.5 - 8.1 g/dL 7.2 6.7 6.9  Total Bilirubin 0.3 - 1.2 mg/dL 0.7 0.7 0.8  Alkaline Phos 38 - 126 U/L 84 82 96  AST 15 - 41 U/L 16 17 14(L)  ALT 0 - 44 U/L '13 14 13      '$ Component Value Date/Time   RBC 5.07 03/06/2021 1007   MCV 95.7 03/06/2021 1007   MCH 31.6 03/06/2021 1007   MCHC 33.0 03/06/2021 1007   RDW 13.6 03/06/2021 1007   LYMPHSABS 1.1 03/06/2021 1007   MONOABS 0.5 03/06/2021 1007   EOSABS 0.0 03/06/2021 1007   BASOSABS 0.1 03/06/2021 1007   Lab Results  Component Value Date   LDH 144 03/06/2021   LDH 141 09/10/2020   LDH 127 03/05/2020   Lab Results  Component Value Date  VD25OH 37.12 03/06/2021   VD25OH 35.37 09/10/2020   VD25OH 34.51 03/05/2020    DIAGNOSTIC IMAGING:  I have independently reviewed the scans and  discussed with the patient. CT Chest Wo Contrast  Result Date: 03/07/2021 CLINICAL DATA:  65 year old female with history of diffuse large B-cell lymphoma. Pulmonary nodules. Follow-up study. EXAM: CT CHEST WITHOUT CONTRAST TECHNIQUE: Multidetector CT imaging of the chest was performed following the standard protocol without IV contrast. COMPARISON:  Chest CT 10/27/2018. FINDINGS: Cardiovascular: Heart size is normal. There is no significant pericardial fluid, thickening or pericardial calcification. There is aortic atherosclerosis, as well as atherosclerosis of the great vessels of the mediastinum and the coronary arteries, including calcified atherosclerotic plaque in the left main, left anterior descending, left circumflex and right coronary arteries. Mediastinum/Nodes: No pathologically enlarged mediastinal or hilar lymph nodes. Esophagus is unremarkable in appearance. No axillary lymphadenopathy. Lungs/Pleura: 4 mm ground-glass attenuation nodule in the anterior aspect of the left upper lobe (axial image 68 of series 4), stable compared to the prior examination from 10/27/2018, considered definitively benign. 3 mm right lower lobe pulmonary nodule (axial image 84 of series 4) also stable in retrospect compared to the prior study, considered definitively benign. No other larger more suspicious appearing pulmonary nodules or masses are noted. No acute consolidative airspace disease. No pleural effusions. Diffuse bronchial wall thickening with moderate centrilobular and paraseptal emphysema. Upper Abdomen: Aortic atherosclerosis. Musculoskeletal: There are no aggressive appearing lytic or blastic lesions noted in the visualized portions of the skeleton. IMPRESSION: 1. Small pulmonary nodules are stable compared to the prior examination, considered definitively benign. 2. No lymphadenopathy noted in the thorax to suggest recurrent lymphoma. 3. Diffuse bronchial wall thickening with moderate centrilobular and  paraseptal emphysema; imaging findings suggestive of underlying COPD. 4. Aortic atherosclerosis, in addition to left main and 3 vessel coronary artery disease. Please note that although the presence of coronary artery calcium documents the presence of coronary artery disease, the severity of this disease and any potential stenosis cannot be assessed on this non-gated CT examination. Assessment for potential risk factor modification, dietary therapy or pharmacologic therapy may be warranted, if clinically indicated. Aortic Atherosclerosis (ICD10-I70.0) and Emphysema (ICD10-J43.9). Electronically Signed   By: Vinnie Langton M.D.   On: 03/07/2021 12:58     ASSESSMENT:  1.  Stage IIIb large B-cell lymphoma: -Presentation with weight loss, night sweats, adenopathy in the neck, abdomen and right inguinal area -6 cycles of R-CHOP from 07/30/2016 through 11/12/2016 - Denies fevers, night sweats or weight loss.  Denies any recurrent infections or hospitalizations. -CT CAP on 06/21/2018 did not show any evidence of adenopathy in the chest, abdomen and pelvis.  Innumerable new groundglass nodules in both lungs.   2.  Left colon thickening: -Her last colonoscopy was in 1999. - Her last CT scan showed thickening of the sigmoid colon area.  The current CT scan on 06/21/2018 showed improved appearance of the colon since 12/21/2017.  She denies any bleeding per rectum or melena.  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 - she continues to be asymptomatic however, colon thickening is concerning.  - encouraged her to have colonoscopy or at least non-invasive colon cancer screening with GI or PCP   3.  Neuropathy: -chemotherapy induced - she will likely need this medication life long. She can discuss refills after 5 years with her pcp.   4. Smoking history:  - continues to smoke   PLAN:  1.  Stage IIIb large B-cell lymphoma: -clinically asymptomatic -Labs reveal normal  LDH and CBC. -RTC 6 months for follow-up.    2.  Smoking history: -Last CT was done in April 2020. -Recommend CT of the chest without contrast prior to next visit. - recommend LDCT annually  - discussed Quit[employer] program   3.  Neuropathy: -Continue gabapentin. - she can have pcp take over prescribing in preparation for being released from follow up in the future.   4.  B12 deficiency: -continue oral b12  5. Heart disease - as evidenced by CT. Recommend she discuss with her pcp or she can follow up with cardiology for second opinion if she chooses.   6. Erythrocytosis - new problem. Hemoglobin 16. HCT 48.5. Suspect related to smoking. Will check some labs today to evaluate further: ferritin & iron studies to evaluate for iron overload, hemochromatosis, CO, JAK2, EPO.  - follow up based on results.  - encouraged her to cut back on her smoking  Orders placed this encounter:  No orders of the defined types were placed in this encounter.  Return to clinic  6 months labs (ldh, cbc, cmp) and MD Referral for LDCT screening   I discussed the assessment and treatment plan with the patient. The patient was provided an opportunity to ask questions and all were answered. The patient agreed with the plan and demonstrated an understanding of the instructions.   The patient was advised to call back or seek an in-person evaluation if the symptoms worsen or if the condition fails to improve as anticipated.   I spent 35 minutes face-to-face video visit time dedicated to the care of this patient on the date of this encounter to include pre-visit review of labs, imaging, medical oncology notes, face-to-face time with the patient, and post visit ordering of testing/documentation.   Beckey Rutter, DNP, AGNP-C [place] [phone]  CC: Dr. Nevada Crane

## 2021-03-14 LAB — ERYTHROPOIETIN: Erythropoietin: 7.8 m[IU]/mL (ref 2.6–18.5)

## 2021-03-14 LAB — CARBON MONOXIDE, BLOOD (PERFORMED AT REF LAB): Carbon Monoxide, Blood: 7.1 % — ABNORMAL HIGH (ref 0.0–3.6)

## 2021-03-20 LAB — JAK2 GENOTYPR

## 2021-03-20 LAB — HEMOCHROMATOSIS DNA-PCR(C282Y,H63D)

## 2021-03-27 ENCOUNTER — Other Ambulatory Visit (HOSPITAL_COMMUNITY): Payer: Self-pay

## 2021-03-27 ENCOUNTER — Encounter (HOSPITAL_COMMUNITY): Payer: Self-pay

## 2021-03-27 DIAGNOSIS — Z122 Encounter for screening for malignant neoplasm of respiratory organs: Secondary | ICD-10-CM

## 2021-03-27 DIAGNOSIS — Z87891 Personal history of nicotine dependence: Secondary | ICD-10-CM

## 2021-03-27 NOTE — Progress Notes (Signed)
Received referral for initial lung cancer screening scan. Contacted patient and obtained smoking history (started age 65 smoking about a 1/4PPD, current 1/4 PPD smoker, she reports for 35 years during this time that she smoked 1PPD, 38.75 pack year) as well as answering questions related to the screening process. Patient denies signs/symptoms of lung cancer such as weight loss or hemoptysis. Patient denies comorbidity that would prevent curative treatment if lung cancer were to be found.  Patient is scheduled for shared decision making visit and CT scan on 04/08/2021 at 1115.

## 2021-03-27 NOTE — Progress Notes (Signed)
LDCT order placed.

## 2021-04-07 NOTE — Progress Notes (Signed)
Gabriella Love  Visit Date: 04/08/21  Visit Type: In-Person at [place] SHARED DECISION-MAKING VISIT - Age: 65 y.o.  - Pack year smoking history: 38.75 pack-years (0.25-1.0 PPD x 50 years) - Type of tobacco abuse: Cigarettes - Current smoker or < 15 years of cessation: Current smoker, 0.25 PPD - No current symptoms of lung cancer:  Patient denies any hemoptysis, unintentional weight loss, and unexplained cough - Risks and benefits of lung cancer screening discussed: Negative: Over-diagnosis, radiation exposure, false positives, and additional testing Positive: Discover early stage lung cancer resulting in higher incidence of cure - Patient educated regarding the importance of adherence to continued lung cancer screening. - Currently, there are no co-morbidities to prevent treatment to therapy for lung cancer and the patient is agreeable to pursue treatment if a malignancy is discovered.  [place] Preventative Services Task Force recommend annual screening for lung cancer with low-dose CT in adults aged 36 - 7 years who have a 20+ pack year smoking history and currently smoke or have quit smoking within the past 15 years.  Screening should be discontinued once a person has not smoked for 15 years or develops a health problem that substantially limits life expectancy or the ability or willingness to have curative lung surgery.  It is a category B recommendation.  Similar stances are provided by CMS, NCCN, and AATS.  Social History   Tobacco Use   Smoking status: Every Day    Packs/day: 0.50    Years: 40.00    Pack years: 20.00    Types: Cigarettes   Smokeless tobacco: Never  Substance Use Topics   Alcohol use: No   Drug use: No     Personal history of tobacco use presenting hazards to health: - This patient meets criteria for low-dose CT lung cancer screening  - The shared decision making visit discussion included risks and benefits of screening,  potential for follow-up, diagnostic testing for abnormal scans, potential for false positive tests, overdiagnosis, discussion about total radiation exposure - Patient stated willingness to undergo diagnostics and treatment as needed - Patient was counseled on smoking cessation to decrease the  risk of lung cancer, pulmonary disease, heart disease, and stroke - Patient has been referred to Lung Cancer Screening Nurse Coordinator for further scheduling of LDCT and for further resources regarding free nicotine replacement therapy and information about smoking cessation classes - Counseling on the importance of adherence to annual lung cancer LDCT screening, impact of co-morbidities, and ability or willingness to undergo diagnosis and treatment is imperative for compliance of the program. - Counseling on the importance of continued smoking cessation for former smokers; the importance of smoking cessation for current smokers, and information about tobacco cessation interventions have been given to patient including [place] and 1-800-quit [place] programs.  Yearly follow up will be coordinated by Adonis Huguenin, RN, MSN [facility] Oncology Nurse Navigator.)  **NOTE: Patient also follows at the [facility] for ongoing surveillance following her treatment of stage IIIb large B-cell lymphoma, s/p R-CHOP x6 cycles.  This was NOT addressed during her LDCT decision making visit today.  She can continue to follow with Dr. Delton Coombes as previously scheduled in February 2023, and knows to call the clinic sooner if she has any concerns that would warrant an earlier appointment.   ADDITIONALLY... Since patient had a CT without contrast of her chest in August 2022 which was negative for malignancy, we will defer LDCT scan of  this year and plan on repeating it in August 2023.   Harriett Rush, PA-C  04/08/21 11:43 AM

## 2021-04-08 ENCOUNTER — Other Ambulatory Visit: Payer: Self-pay

## 2021-04-08 ENCOUNTER — Ambulatory Visit (HOSPITAL_COMMUNITY): Payer: Medicare Other

## 2021-04-08 ENCOUNTER — Inpatient Hospital Stay (HOSPITAL_COMMUNITY): Payer: Medicare Other | Attending: Hematology | Admitting: Physician Assistant

## 2021-04-08 DIAGNOSIS — F17210 Nicotine dependence, cigarettes, uncomplicated: Secondary | ICD-10-CM | POA: Diagnosis not present

## 2021-04-08 DIAGNOSIS — Z87891 Personal history of nicotine dependence: Secondary | ICD-10-CM

## 2021-04-08 NOTE — Patient Instructions (Signed)
You were seen today for your Lung Cancer Screening shared decision-making visit and low-dose CT scan.  Thank you for your participation in this lifesaving program!

## 2021-05-16 DIAGNOSIS — Z23 Encounter for immunization: Secondary | ICD-10-CM | POA: Diagnosis not present

## 2021-05-21 ENCOUNTER — Encounter (HOSPITAL_COMMUNITY): Payer: Self-pay

## 2021-05-21 NOTE — Progress Notes (Signed)
Attempted to call patient to schedule LDCT. Patient unavailable. No VM box available.

## 2021-06-06 DIAGNOSIS — I10 Essential (primary) hypertension: Secondary | ICD-10-CM | POA: Diagnosis not present

## 2021-06-11 DIAGNOSIS — E782 Mixed hyperlipidemia: Secondary | ICD-10-CM | POA: Diagnosis not present

## 2021-06-11 DIAGNOSIS — I2510 Atherosclerotic heart disease of native coronary artery without angina pectoris: Secondary | ICD-10-CM | POA: Diagnosis not present

## 2021-06-11 DIAGNOSIS — K219 Gastro-esophageal reflux disease without esophagitis: Secondary | ICD-10-CM | POA: Diagnosis not present

## 2021-06-11 DIAGNOSIS — Z8572 Personal history of non-Hodgkin lymphomas: Secondary | ICD-10-CM | POA: Diagnosis not present

## 2021-06-11 DIAGNOSIS — M5442 Lumbago with sciatica, left side: Secondary | ICD-10-CM | POA: Diagnosis not present

## 2021-06-11 DIAGNOSIS — F17219 Nicotine dependence, cigarettes, with unspecified nicotine-induced disorders: Secondary | ICD-10-CM | POA: Diagnosis not present

## 2021-06-11 DIAGNOSIS — I10 Essential (primary) hypertension: Secondary | ICD-10-CM | POA: Diagnosis not present

## 2021-06-11 DIAGNOSIS — K7689 Other specified diseases of liver: Secondary | ICD-10-CM | POA: Diagnosis not present

## 2021-06-11 DIAGNOSIS — G620 Drug-induced polyneuropathy: Secondary | ICD-10-CM | POA: Diagnosis not present

## 2021-06-11 DIAGNOSIS — C8330 Diffuse large B-cell lymphoma, unspecified site: Secondary | ICD-10-CM | POA: Diagnosis not present

## 2021-07-08 NOTE — Progress Notes (Signed)
Referring-John Z. [place] Crane, MD Reason for referral-coronary artery disease  HPI: 65 year old female for evaluation of coronary artery disease at request of Edwinna Areola. Nevada Crane, MD. Patient had PCI of her right coronary artery in 2011 in the setting of myocardial infarction.  Records are not available.  Echocardiogram December 2017 showed normal LV function and grade 1 diastolic dysfunction.  Chest CT August 2022 showed emphysema, aortic atherosclerosis and calcium in the left main as well as three-vessel coronary artery disease.  Laboratories November 2022 showed total cholesterol 140, HDL 39 and LDL 71.  BUN 9 and creatinine 0.77.  Patient has dyspnea with more vigorous activities.  She denies orthopnea, PND, pedal edema, claudication or syncope.  She occasionally has a twinge of chest pain lasting seconds.  She denies exertional chest pain.  Cardiology now asked to evaluate.  Current Outpatient Medications  Medication Sig Dispense Refill   albuterol (VENTOLIN HFA) 108 (90 Base) MCG/ACT inhaler      ALPRAZolam (XANAX) 0.5 MG tablet Take 0.25-0.5 mg by mouth daily as needed for anxiety.     aspirin 81 MG chewable tablet      buPROPion (WELLBUTRIN XL) 150 MG 24 hr tablet Take 150 mg by mouth daily.     fluticasone (FLONASE) 50 MCG/ACT nasal spray Place into both nostrils daily.     gabapentin (NEURONTIN) 300 MG capsule Take 300 mg by mouth 3 (three) times daily.     pantoprazole (PROTONIX) 40 MG tablet Take 40 mg by mouth daily. Pt takes at supper daily     rosuvastatin (CRESTOR) 20 MG tablet Take 20 mg by mouth daily.     celecoxib (CELEBREX) 200 MG capsule Take 200 mg by mouth daily.     ezetimibe (ZETIA) 10 MG tablet Take 10 mg by mouth at bedtime.     No current facility-administered medications for this visit.    Allergies  Allergen Reactions   Amoxicillin Nausea And Vomiting    Yeast Infections  Has patient had a PCN reaction causing immediate rash, facial/tongue/throat swelling, SOB or  lightheadedness with hypotension: No Has patient had a PCN reaction causing severe rash involving mucus membranes or skin necrosis: No Has patient had a PCN reaction that required hospitalization No Has patient had a PCN reaction occurring within the last 10 years: No If all of the above answers are "NO", then may proceed with Cephalosporin use.    Choline Fenofibrate     REACTION: Abd pain and elevated Lipase   Ezetimibe Nausea And Vomiting   Penicillins     Yeast infections, thrush  Has patient had a PCN reaction causing immediate rash, facial/tongue/throat swelling, SOB or lightheadedness with hypotension: No Has patient had a PCN reaction causing severe rash involving mucus membranes or skin necrosis: No Has patient had a PCN reaction that required hospitalization No Has patient had a PCN reaction occurring within the last 10 years: No If all of the above answers are "NO", then may proceed with Cephalosporin use.    Percocet [Oxycodone-Acetaminophen] Nausea And Vomiting     Past Medical History:  Diagnosis Date   Anxiety    Arthritis    CAD (coronary artery disease)    COPD (chronic obstructive pulmonary disease) (HCC)    DLBCL (diffuse large B cell lymphoma) ([place]) 07/16/2016   GERD (gastroesophageal reflux disease)    Hyperlipidemia    Hypertension    Irritable bowel syndrome (IBS)    with diarrhea   Myocardial infarction ([place]) 12/19/2009   released  from cardiology    Past Surgical History:  Procedure Laterality Date   CARDIAC CATHETERIZATION     cardiac stent   CHOLECYSTECTOMY     CORONARY STENT PLACEMENT  2011   INGUINAL LYMPH NODE BIOPSY Left 07/13/2016   Procedure: EXCISIONAL BIOPSY OF LEFT INGUINAL LYMPH NODE;  Surgeon: Vickie Epley, MD;  Location: [facility];  Service: General;  Laterality: Left;   PORT-A-CATH REMOVAL Right 05/20/2018   Procedure: MINOR REMOVAL PORT-A-CATH;  Surgeon: Aviva Signs, MD;  Location: [facility];  Service: General;  Laterality: Right;    PORTACATH PLACEMENT N/A 07/17/2016   Procedure: INSERTION OF TUNNELED CENTRAL VENOUS CATHETER WITH SUBCUTANEOUS PORT;  Surgeon: Vickie Epley, MD;  Location: [facility];  Service: Vascular;  Laterality: N/A;   TUBAL LIGATION      Social History   Socioeconomic History   Marital status: Single    Spouse name: Not on file   Number of children: 2   Years of education: Not on file   Highest education level: Not on file  Occupational History   Not on file  Tobacco Use   Smoking status: Every Day    Packs/day: 0.50    Years: 40.00    Pack years: 20.00    Types: Cigarettes   Smokeless tobacco: Never  Substance and Sexual Activity   Alcohol use: No   Drug use: No   Sexual activity: Yes    Birth control/protection: Post-menopausal  Other Topics Concern   Not on file  Social History Narrative   Not on file   Social Determinants of Health   Financial Resource Strain: Not on file  Food Insecurity: Not on file  Transportation Needs: Not on file  Physical Activity: Not on file  Stress: Not on file  Social Connections: Not on file  Intimate Partner Violence: Not on file    Family History  Problem Relation Age of Onset   Cancer Mother        colon   Depression Mother    Heart disease Father    Depression Sister    Cancer Sister        leukemia   Hyperlipidemia Brother    Hypertension Brother     ROS: no fevers or chills, productive cough, hemoptysis, dysphasia, odynophagia, melena, hematochezia, dysuria, hematuria, rash, seizure activity, orthopnea, PND, pedal edema, claudication. Remaining systems are negative.  Physical Exam:   Blood pressure 122/68, pulse 89, height 5\' 1"  (1.549 m), weight 119 lb 12.8 oz (54.3 kg), SpO2 97 %.  General:  Well developed/well nourished in NAD Skin warm/dry Patient not depressed No peripheral clubbing Back-normal HEENT-normal/normal eyelids Neck supple/normal carotid upstroke bilaterally; no bruits; no JVD; no thyromegaly chest -  CTA/ normal expansion CV - RRR/normal S1 and S2; no murmurs, rubs or gallops;  PMI nondisplaced Abdomen -NT/ND, no HSM, no mass, + bowel sounds, no bruit 2+ femoral pulses, no bruits Ext-no edema, chords, 2+ DP Neuro-grossly nonfocal  ECG -normal sinus rhythm at a rate of 89, no significant ST changes.  Personally reviewed  A/P  1 coronary artery disease-based on CT demonstrating coronary calcification.  Occasional atypical chest pain.  Plan stress nuclear study to screen for ischemia.  Continue ASA and statin.  2 hypertension-blood pressure controlled now on no meds.  3 hyperlipidemia-given documented coronary disease will increase Crestor to 40 mg daily.  Check lipids and liver in 8 weeks.  She is intolerant to Zetia.  4 tobacco abuse-patient counseled on discontinuing.  Kirk Ruths, MD

## 2021-07-11 ENCOUNTER — Encounter: Payer: Self-pay | Admitting: Cardiology

## 2021-07-11 ENCOUNTER — Ambulatory Visit (INDEPENDENT_AMBULATORY_CARE_PROVIDER_SITE_OTHER): Payer: Medicare Other | Admitting: Cardiology

## 2021-07-11 ENCOUNTER — Other Ambulatory Visit: Payer: Self-pay

## 2021-07-11 VITALS — BP 122/68 | HR 89 | Ht 61.0 in | Wt 119.8 lb

## 2021-07-11 DIAGNOSIS — I10 Essential (primary) hypertension: Secondary | ICD-10-CM | POA: Diagnosis not present

## 2021-07-11 DIAGNOSIS — R072 Precordial pain: Secondary | ICD-10-CM

## 2021-07-11 DIAGNOSIS — I2510 Atherosclerotic heart disease of native coronary artery without angina pectoris: Secondary | ICD-10-CM | POA: Diagnosis not present

## 2021-07-11 DIAGNOSIS — E7800 Pure hypercholesterolemia, unspecified: Secondary | ICD-10-CM

## 2021-07-11 MED ORDER — ROSUVASTATIN CALCIUM 40 MG PO TABS: 40.0000 mg | ORAL_TABLET | Freq: Every day | ORAL | 3 refills | Status: DC

## 2021-07-11 NOTE — Addendum Note (Signed)
Addended by: Cristopher Estimable on: 07/11/2021 08:37 AM   Modules accepted: Orders

## 2021-07-11 NOTE — Patient Instructions (Signed)
Medication Instructions:   INCREASE ROSUVASTATIN TO 40 MG ONCE DAILY=2 OF THE 20 MG TABLETS ONCE DAILY  *If you need a refill on your cardiac medications before your next appointment, please call your pharmacy*   Lab Work:  Your physician recommends that you return for lab work in: [place] If you have labs (blood work) drawn today and your tests are completely normal, you will receive your results only by: [place] (if you have MyChart) OR A paper copy in the mail If you have any lab test that is abnormal or we need to change your treatment, we will call you to review the results.   Testing/Procedures:  Your physician has requested that you have en exercise stress myoview. For further information please visit [email or web address]. Please follow instruction sheet, as given. [place] has requested that you have an echocardiogram. Echocardiography is a painless test that uses sound waves to create images of your heart. It provides your doctor with information about the size and shape of your heart and how well your hearts chambers and valves are working. This procedure takes approximately one hour. There are no restrictions for this procedure. [place]   Follow-Up: At [facility], you and your health needs are our priority.  As part of our continuing mission to provide you with exceptional heart care, we have created designated Provider Care Teams.  These Care Teams include your primary Cardiologist (physician) and Advanced Practice Providers (APPs -  Physician Assistants and Nurse Practitioners) who all work together to provide you with the care you need, when you need it.  We recommend signing up for the patient portal called "MyChart".  Sign up information is provided on this After Visit Summary.  MyChart is used to connect with patients for Virtual Visits (Telemedicine).  Patients are able to view lab/test results, encounter  notes, upcoming appointments, etc.  Non-urgent messages can be sent to your provider as well.   To learn more about what you can do with MyChart, go to [email or web address].    Your next appointment:   12 month(s)  The format for your next appointment:   In Person  Provider:   Kirk Ruths MD

## 2021-08-04 ENCOUNTER — Telehealth (HOSPITAL_COMMUNITY): Payer: Self-pay | Admitting: *Deleted

## 2021-08-04 NOTE — Telephone Encounter (Signed)
Patient given detailed instructions per Myocardial Perfusion Study Information Sheet for the test on 08/08/21 at 10:15. Patient notified to arrive 15 minutes early and that it is imperative to arrive on time for appointment to keep from having the test rescheduled.  If you need to cancel or reschedule your appointment, please call the office within 24 hours of your appointment. . Patient verbalized understanding.Gabriella Love

## 2021-08-08 ENCOUNTER — Encounter (HOSPITAL_COMMUNITY): Payer: Medicare Other

## 2021-08-08 ENCOUNTER — Other Ambulatory Visit (HOSPITAL_COMMUNITY): Payer: Medicare Other

## 2021-08-12 DIAGNOSIS — J449 Chronic obstructive pulmonary disease, unspecified: Secondary | ICD-10-CM | POA: Diagnosis not present

## 2021-08-12 DIAGNOSIS — R0981 Nasal congestion: Secondary | ICD-10-CM | POA: Diagnosis not present

## 2021-08-12 DIAGNOSIS — J069 Acute upper respiratory infection, unspecified: Secondary | ICD-10-CM | POA: Diagnosis not present

## 2021-08-12 DIAGNOSIS — N390 Urinary tract infection, site not specified: Secondary | ICD-10-CM | POA: Diagnosis not present

## 2021-08-12 DIAGNOSIS — R059 Cough, unspecified: Secondary | ICD-10-CM | POA: Diagnosis not present

## 2021-08-13 ENCOUNTER — Telehealth (HOSPITAL_COMMUNITY): Payer: Self-pay

## 2021-08-13 NOTE — Telephone Encounter (Signed)
Spoke with the patient, she is still sick. She decided to call us back when she is well. ECHO and NUC will be cancelled out. S.Vasiliki Smaldone EMTP

## 2021-08-15 ENCOUNTER — Other Ambulatory Visit (HOSPITAL_COMMUNITY): Payer: Medicaid Other

## 2021-08-15 ENCOUNTER — Ambulatory Visit: Payer: Medicare Other | Admitting: Cardiovascular Disease

## 2021-08-19 ENCOUNTER — Other Ambulatory Visit (HOSPITAL_COMMUNITY): Payer: Self-pay

## 2021-08-19 ENCOUNTER — Encounter (HOSPITAL_COMMUNITY): Payer: Medicaid Other

## 2021-08-25 ENCOUNTER — Telehealth (HOSPITAL_COMMUNITY): Payer: Self-pay | Admitting: Cardiovascular Disease

## 2021-08-25 NOTE — Telephone Encounter (Signed)
I called to reschedule echo and Myoview and patient states she is not feeling much better and did not wish to reschedule at this time and will call us when she wants to reschedule. Orders will be removed form the [facility].

## 2021-09-02 NOTE — Progress Notes (Signed)
Reviewed at 03/13/21 OV by Beckey Rutter, NP

## 2021-09-11 ENCOUNTER — Other Ambulatory Visit: Payer: Self-pay

## 2021-09-11 ENCOUNTER — Inpatient Hospital Stay (HOSPITAL_COMMUNITY): Payer: Medicare Other | Attending: Hematology

## 2021-09-11 DIAGNOSIS — D751 Secondary polycythemia: Secondary | ICD-10-CM

## 2021-09-11 DIAGNOSIS — E538 Deficiency of other specified B group vitamins: Secondary | ICD-10-CM | POA: Insufficient documentation

## 2021-09-11 DIAGNOSIS — G629 Polyneuropathy, unspecified: Secondary | ICD-10-CM | POA: Insufficient documentation

## 2021-09-11 DIAGNOSIS — C8330 Diffuse large B-cell lymphoma, unspecified site: Secondary | ICD-10-CM

## 2021-09-11 LAB — CBC WITH DIFFERENTIAL/PLATELET
Abs Immature Granulocytes: 0.03 10*3/uL (ref 0.00–0.07)
Basophils Absolute: 0.1 10*3/uL (ref 0.0–0.1)
Basophils Relative: 1 %
Eosinophils Absolute: 0.1 10*3/uL (ref 0.0–0.5)
Eosinophils Relative: 1 %
HCT: 46.3 % — ABNORMAL HIGH (ref 36.0–46.0)
Hemoglobin: 15.5 g/dL — ABNORMAL HIGH (ref 12.0–15.0)
Immature Granulocytes: 1 %
Lymphocytes Relative: 24 %
Lymphs Abs: 1.4 10*3/uL (ref 0.7–4.0)
MCH: 31.8 pg (ref 26.0–34.0)
MCHC: 33.5 g/dL (ref 30.0–36.0)
MCV: 94.9 fL (ref 80.0–100.0)
Monocytes Absolute: 0.5 10*3/uL (ref 0.1–1.0)
Monocytes Relative: 8 %
Neutro Abs: 3.7 10*3/uL (ref 1.7–7.7)
Neutrophils Relative %: 65 %
Platelets: 267 10*3/uL (ref 150–400)
RBC: 4.88 MIL/uL (ref 3.87–5.11)
RDW: 13 % (ref 11.5–15.5)
WBC: 5.6 10*3/uL (ref 4.0–10.5)
nRBC: 0 % (ref 0.0–0.2)

## 2021-09-11 LAB — COMPREHENSIVE METABOLIC PANEL
ALT: 15 U/L (ref 0–44)
AST: 16 U/L (ref 15–41)
Albumin: 4.1 g/dL (ref 3.5–5.0)
Alkaline Phosphatase: 95 U/L (ref 38–126)
Anion gap: 12 (ref 5–15)
BUN: 11 mg/dL (ref 8–23)
CO2: 26 mmol/L (ref 22–32)
Calcium: 9.5 mg/dL (ref 8.9–10.3)
Chloride: 101 mmol/L (ref 98–111)
Creatinine, Ser: 0.74 mg/dL (ref 0.44–1.00)
GFR, Estimated: >60 mL/min (ref >60)
Glucose, Bld: 117 mg/dL — ABNORMAL HIGH (ref 70–99)
Potassium: 3.8 mmol/L (ref 3.5–5.1)
Sodium: 139 mmol/L (ref 135–145)
Total Bilirubin: 0.5 mg/dL (ref 0.3–1.2)
Total Protein: 6.7 g/dL (ref 6.5–8.1)

## 2021-09-11 LAB — LACTATE DEHYDROGENASE: LDH: 119 U/L (ref 98–192)

## 2021-09-18 ENCOUNTER — Ambulatory Visit (HOSPITAL_COMMUNITY): Payer: Medicare Other | Admitting: Hematology

## 2021-09-29 ENCOUNTER — Inpatient Hospital Stay (HOSPITAL_COMMUNITY): Payer: Medicare Other | Attending: Hematology | Admitting: Hematology

## 2021-09-29 ENCOUNTER — Other Ambulatory Visit: Payer: Self-pay

## 2021-09-29 DIAGNOSIS — G629 Polyneuropathy, unspecified: Secondary | ICD-10-CM | POA: Insufficient documentation

## 2021-09-29 DIAGNOSIS — D751 Secondary polycythemia: Secondary | ICD-10-CM | POA: Insufficient documentation

## 2021-09-29 DIAGNOSIS — E538 Deficiency of other specified B group vitamins: Secondary | ICD-10-CM | POA: Insufficient documentation

## 2021-09-29 DIAGNOSIS — J439 Emphysema, unspecified: Secondary | ICD-10-CM | POA: Insufficient documentation

## 2021-09-29 DIAGNOSIS — Z800 Family history of malignant neoplasm of digestive organs: Secondary | ICD-10-CM | POA: Diagnosis not present

## 2021-09-29 DIAGNOSIS — C8330 Diffuse large B-cell lymphoma, unspecified site: Secondary | ICD-10-CM | POA: Diagnosis not present

## 2021-09-29 DIAGNOSIS — Z806 Family history of leukemia: Secondary | ICD-10-CM | POA: Insufficient documentation

## 2021-09-29 DIAGNOSIS — Z8572 Personal history of non-Hodgkin lymphomas: Secondary | ICD-10-CM | POA: Insufficient documentation

## 2021-09-29 DIAGNOSIS — I10 Essential (primary) hypertension: Secondary | ICD-10-CM | POA: Diagnosis not present

## 2021-09-29 DIAGNOSIS — F17210 Nicotine dependence, cigarettes, uncomplicated: Secondary | ICD-10-CM | POA: Diagnosis not present

## 2021-09-29 NOTE — Patient Instructions (Signed)
[place] at [facility] ?Discharge Instructions ? ?You were seen and examined today by Dr. Delton Coombes. He reviewed your most recent labs and everything looks good except your white blood cell count is still elevated. This is coming from your smoking. Please keep follow up appointment as scheduled in 12 months. ? ? ?Thank you for choosing [place] at [facility] to provide your oncology and hematology care.  To afford each patient quality time with our provider, please arrive at least 15 minutes before your scheduled appointment time.  ? ?If you have a lab appointment with the [facility] please come in thru the Main Entrance and check in at the main information desk. ? ?You need to re-schedule your appointment should you arrive 10 or more minutes late.  We strive to give you quality time with our providers, and arriving late affects you and other patients whose appointments are after yours.  Also, if you no show three or more times for appointments you may be dismissed from the clinic at the providers discretion.     ?Again, thank you for choosing [facility]  Our hope is that these requests will decrease the amount of time that you wait before being seen by our physicians.       ?_____________________________________________________________ ? ?Should you have questions after your visit to [facility], please contact our office at [phone] and follow the prompts.  Our office hours are 8:00 a.m. and 4:30 p.m. Monday - Friday.  Please note that voicemails left after 4:00 p.m. may not be returned until the following business day.  We are closed weekends and major holidays.  You do have access to a nurse 24-7, just call the main number to the clinic [phone] and do not press any options, hold on the line and a nurse will answer the phone.   ? ?For prescription refill requests, have your pharmacy contact our office and allow 72  hours.   ? ?Due to Covid, you will need to wear a mask upon entering the hospital. If you do not have a mask, a mask will be given to you at the Main Entrance upon arrival. For doctor visits, patients may have 1 support person age 78 or older with them. For treatment visits, patients can not have anyone with them due to social distancing guidelines and our immunocompromised population.  ? ?  ?

## 2021-09-29 NOTE — Progress Notes (Signed)
Gabriella Love, [place] [address]   CLINIC:  Medical Oncology/Hematology  PCP:  Celene Squibb, MD [address] [name] [place] [address] [phone]   REASON FOR VISIT:  Follow-up for DLBCL  PRIOR THERAPY: R-CHOP x 6 cycles from 07/30/2016 to 11/12/2016  CURRENT THERAPY: surveillance  BRIEF ONCOLOGIC HISTORY:  Oncology History  Lymphoma, large-cell, diffuse ([place])  07/13/2016 Initial Biopsy   L inguinal biopsy with Dr. 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 Grade (if applicable): High grade. Flow cytometry: [id number] reveals a monoclonal B-cell population with expression of CD10. Immunohistochemical stains: CD20, CD3, CD10, bcl-2, bcl-6, CD21, CD23, Ki-67. Touch preps/imprints: Mixture of large and smaller lymphocytes with irregular nuclear contours. Comments: Sections of lymph node reveal effacement of the architecture by a nodular proliferation of neoplastic appearing follicles. In many of the areas the follicles coalesce and form sheets of large cells. The more formed follicles predominately consist of numerous large centroblasts (>15 per hpf), representing a high grade (3B) follicular component. The diffuse areas are also composed of large cells but lack follicular dendritic networks (CD21, CD23). Immunohistochemistry reveals the atypical lymphocytes are positive for CD20, CD10, and bcl-6. They are negative for bcl-2. Ki-67 is elevated (30% overall). CD3 reveals residual surrounding T-cells. Flow cytometry ([id number]) reveals a monoclonal B-cell population with expression of CD10. 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 The case was called to Dr. Rosana Hoes on  07/16/2016.   07/23/2016 Echocardiogram   Left ventricle: Systolic function was normal. The estimated   ejection fraction was in the range of 50% to 55%. Doppler   parameters are consistent with abnormal left ventricular   relaxation (grade 1 diastolic dysfunction).   07/24/2016 Procedure   Technically successful CT guided right iliac bone core and aspiration biopsy by IR.   07/28/2016 PET scan   1. Hypermetabolic lymphadenopathy in the neck, abdomen and right inguinal area as described above, consistent with known lymphoma. 2. Focal area of hypermetabolism in the appendix could be lymphomatous involvement. Recommend attention on follow-up scans. 3. Age advanced atherosclerotic calcifications involving the abdominal aorta and branch vessels and age advanced three-vessel coronary artery calcifications. 4. No obvious osseous involvement. 5. Emphysematous changes and pulmonary scarring.   07/28/2016 Pathology Results   Bone Marrow, Aspirate,Biopsy, and Clot, right iliac - NORMOCELLULAR BONE MARROW WITH TRILINEAGE HEMATOPOIESIS. PERIPHERAL BLOOD: - OCCASIONAL CIRCULATING ATYPICAL LYMPHOID CELLS.   07/28/2016 Pathology Results   Bone Marrow Flow Cytometry - NO MONOCLONAL B CELL POPULATION OR ABNORMAL T CELL PHENOTYPE IDENTIFIED.   07/30/2016 Pathology Results   Peripheral Blood Flow Cytometry - NO MONOCLONAL B-CELL POPULATION OR ABNORMAL T-CELL PHENOTYPE IDENTIFIED.   07/30/2016 -  Chemotherapy   The patient had DOXOrubicin (ADRIAMYCIN) chemo injection 78 mg, 50 mg/m2 = 78 mg, Intravenous,  Once, 2 of 6 cycles  palonosetron (ALOXI) injection 0.25 mg, 0.25 mg, Intravenous,  Once, 2 of 6 cycles  pegfilgrastim (NEULASTA ONPRO KIT) injection 6 mg, 6 mg, Subcutaneous, Once, 2 of 6 cycles  vinCRIStine (ONCOVIN) 2 mg in sodium chloride 0.9 % 50 mL chemo infusion, 2 mg, Intravenous,  Once, 2 of 6 cycles  riTUXimab (RITUXAN) 600 mg in sodium chloride 0.9 % 250 mL (1.9355 mg/mL) chemo infusion,  375  mg/m2 = 600 mg, Intravenous,  Once, 2 of 6 cycles  cyclophosphamide (CYTOXAN) 1,180 mg in sodium chloride 0.9 % 250 mL chemo infusion, 750 mg/m2 = 1,180 mg, Intravenous,  Once, 2 of 6 cycles  for chemotherapy treatment.     08/03/2016 Pathology Results   Cytogenetic lab results [facility]). Cytogenetic Analysis: Normal.    09/23/2016 PET scan   1. Significantly improved appearance, with resolution of prior hypermetabolic activity in all of the previously involved lymph nodes. All lymph nodes are currently within normal size limits. 2. Other imaging findings of potential clinical significance: Chronic right sphenoid sinusitis. Right mastoid effusion. Coronary, aortic arch, and branch vessel atherosclerotic vascular disease. Severe emphysema. Aortoiliac atherosclerotic vascular disease.   11/12/2016 -  Chemotherapy   Completed cycle 6 of RCHOP    12/11/2016 PET scan   1. No evidence of residual hypermetabolic lymphoma or adenopathy. 2. Age advanced coronary artery atherosclerosis. Recommend assessment of coronary risk factors and consideration of medical therapy. 3. Centrilobular emphysema.   06/18/2017 PET scan   IMPRESSION: 1. No new or recurrent hypermetabolic adenopathy in the neck, chest, abdomen, or pelvis. 2. There is potentially some mild wall thickening along a 6 cm segment of the distal ileum. Scattered accentuated metabolic activity in the bowel, likely physiologic. There is some fat deposition in the proximal colon wall, which is typically considered incidental although there is a weak association with inflammatory bowel disease. 3. Aortic Atherosclerosis (ICD10-I70.0) and Emphysema (ICD10-J43.9). Coronary atherosclerosis.     CANCER STAGING:  Cancer Staging  Lymphoma, large-cell, diffuse ([place]) Staging form: Hodgkin and Non-Hodgkin Lymphoma, AJCC 8th Edition - Clinical stage from 07/31/2016: Stage III (Diffuse large B-cell lymphoma) - Signed by Baird Cancer,  PA-C on 07/31/2016   INTERVAL HISTORY:  Gabriella Love, a 66 y.o. female, returns for routine follow-up of her DLBCL. Gabriella Love was last seen on 09/10/2020.   Today she reports feeling good. She denies fevers, night sweats, weight loss, rash, and lumps. She currently smokes 6-7 cigarettes daily. She is taking vitamin B-12 and gabapentin. She denies recently taking iron tablets. She   REVIEW OF SYSTEMS:  Review of Systems  Constitutional:  Negative for appetite change, fatigue, fever and unexpected weight change.  HENT:   Negative for lump/mass.   Respiratory:  Positive for cough and shortness of breath.   Gastrointestinal:  Positive for constipation and diarrhea.  Endocrine: Negative for hot flashes.  Skin:  Negative for rash.  Neurological:  Positive for numbness (foot).  Psychiatric/Behavioral:  The patient is nervous/anxious.   All other systems reviewed and are negative.  PAST MEDICAL/SURGICAL HISTORY:  Past Medical History:  Diagnosis Date   Anxiety    Arthritis    CAD (coronary artery disease)    COPD (chronic obstructive pulmonary disease) ([facility])    DLBCL (diffuse large B cell lymphoma) ([place]) 07/16/2016   GERD (gastroesophageal reflux disease)    Hyperlipidemia    Hypertension    Irritable bowel syndrome (IBS)    with diarrhea   Myocardial infarction ([place]) 12/19/2009   released from cardiology   Past Surgical History:  Procedure Laterality Date   CARDIAC CATHETERIZATION     cardiac stent   CHOLECYSTECTOMY     CORONARY STENT PLACEMENT  2011   INGUINAL LYMPH NODE BIOPSY Left 07/13/2016   Procedure: EXCISIONAL BIOPSY OF LEFT INGUINAL LYMPH NODE;  Surgeon: Vickie Epley, MD;  Location: [facility];  Service: General;  Laterality: Left;   PORT-A-CATH REMOVAL Right 05/20/2018  Procedure: MINOR REMOVAL PORT-A-CATH;  Surgeon: Aviva Signs, MD;  Location: [facility];  Service: General;  Laterality: Right;   PORTACATH PLACEMENT N/A 07/17/2016   Procedure: INSERTION OF TUNNELED  CENTRAL VENOUS CATHETER WITH SUBCUTANEOUS PORT;  Surgeon: Vickie Epley, MD;  Location: [facility];  Service: Vascular;  Laterality: N/A;   TUBAL LIGATION      SOCIAL HISTORY:  Social History   Socioeconomic History   Marital status: Single    Spouse name: Not on file   Number of children: 2   Years of education: Not on file   Highest education level: Not on file  Occupational History   Not on file  Tobacco Use   Smoking status: Every Day    Packs/day: 0.50    Years: 40.00    Pack years: 20.00    Types: Cigarettes   Smokeless tobacco: Never  Substance and Sexual Activity   Alcohol use: No   Drug use: No   Sexual activity: Yes    Birth control/protection: Post-menopausal  Other Topics Concern   Not on file  Social History Narrative   Not on file   Social Determinants of Health   Financial Resource Strain: Not on file  Food Insecurity: Not on file  Transportation Needs: Not on file  Physical Activity: Not on file  Stress: Not on file  Social Connections: Not on file  Intimate Partner Violence: Not on file    FAMILY HISTORY:  Family History  Problem Relation Age of Onset   Cancer Mother        colon   Depression Mother    Heart disease Father    Depression Sister    Cancer Sister        leukemia   Hyperlipidemia Brother    Hypertension Brother     CURRENT MEDICATIONS:  Current Outpatient Medications  Medication Sig Dispense Refill   albuterol (VENTOLIN HFA) 108 (90 Base) MCG/ACT inhaler      ALPRAZolam (XANAX) 0.5 MG tablet Take 0.25-0.5 mg by mouth daily as needed for anxiety.     aspirin 81 MG chewable tablet      buPROPion (WELLBUTRIN XL) 150 MG 24 hr tablet Take 150 mg by mouth daily.     fluticasone (FLONASE) 50 MCG/ACT nasal spray Place into both nostrils daily.     gabapentin (NEURONTIN) 300 MG capsule Take 300 mg by mouth 3 (three) times daily.     pantoprazole (PROTONIX) 40 MG tablet Take 40 mg by mouth daily. Pt takes at supper daily      rosuvastatin (CRESTOR) 40 MG tablet Take 1 tablet (40 mg total) by mouth daily. 90 tablet 3   No current facility-administered medications for this visit.    ALLERGIES:  Allergies  Allergen Reactions   Amoxicillin Nausea And Vomiting    Yeast Infections  Has patient had a PCN reaction causing immediate rash, facial/tongue/throat swelling, SOB or lightheadedness with hypotension: No Has patient had a PCN reaction causing severe rash involving mucus membranes or skin necrosis: No Has patient had a PCN reaction that required hospitalization No Has patient had a PCN reaction occurring within the last 10 years: No If all of the above answers are "NO", then may proceed with Cephalosporin use.    Choline Fenofibrate     REACTION: Abd pain and elevated Lipase   Ezetimibe Nausea And Vomiting   Penicillins     Yeast infections, thrush  Has patient had a PCN reaction causing immediate rash, facial/tongue/throat swelling, SOB or  lightheadedness with hypotension: No Has patient had a PCN reaction causing severe rash involving mucus membranes or skin necrosis: No Has patient had a PCN reaction that required hospitalization No Has patient had a PCN reaction occurring within the last 10 years: No If all of the above answers are "NO", then may proceed with Cephalosporin use.    Percocet [Oxycodone-Acetaminophen] Nausea And Vomiting    PHYSICAL EXAM:  Performance status (ECOG): 1 - Symptomatic but completely ambulatory  There were no vitals filed for this visit. Wt Readings from Last 3 Encounters:  07/11/21 119 lb 12.8 oz (54.3 kg)  03/13/21 114 lb 6.7 oz (51.9 kg)  09/10/20 121 lb 1.6 oz (54.9 kg)   Physical Exam Vitals reviewed.  Constitutional:      Appearance: Normal appearance.  Cardiovascular:     Rate and Rhythm: Normal rate and regular rhythm.     Pulses: Normal pulses.     Heart sounds: Normal heart sounds.  Pulmonary:     Effort: Pulmonary effort is normal.     Breath sounds:  Normal breath sounds.  Abdominal:     Palpations: Abdomen is soft. There is no hepatomegaly, splenomegaly or mass.     Tenderness: There is no abdominal tenderness.  Musculoskeletal:     Right lower leg: No edema.     Left lower leg: No edema.  Lymphadenopathy:     Cervical: No cervical adenopathy.     Right cervical: No superficial, deep or posterior cervical adenopathy.    Left cervical: No superficial, deep or posterior cervical adenopathy.     Upper Body:     Right upper body: No supraclavicular, axillary or pectoral adenopathy.     Left upper body: No supraclavicular, axillary or pectoral adenopathy.     Lower Body: No right inguinal adenopathy. No left inguinal adenopathy.  Neurological:     General: No focal deficit present.     Mental Status: She is alert and oriented to person, place, and time.  Psychiatric:        Mood and Affect: Mood normal.        Behavior: Behavior normal.     LABORATORY DATA:  I have reviewed the labs as listed.  CBC Latest Ref Rng & Units 09/11/2021 03/06/2021 09/10/2020  WBC 4.0 - 10.5 K/uL 5.6 5.8 5.4  Hemoglobin 12.0 - 15.0 g/dL 15.5(H) 16.0(H) 15.0  Hematocrit 36.0 - 46.0 % 46.3(H) 48.5(H) 46.1(H)  Platelets 150 - 400 K/uL 267 222 236   CMP Latest Ref Rng & Units 09/11/2021 03/06/2021 09/10/2020  Glucose 70 - 99 mg/dL 117(H) 98 121(H)  BUN 8 - 23 mg/dL _0 Creatinine 0.44 - 1.00 mg/dL 0.74 0.77 0.81  Sodium 135 - 145 mmol/L 139 138 141  Potassium 3.5 - 5.1 mmol/L 3.8 4.4 4.5  Chloride 98 - 111 mmol/L 101 103 105  CO2 22 - 32 mmol/L _1 Calcium 8.9 - 10.3 mg/dL 9.5 9.7 9.6  Total Protein 6.5 - 8.1 g/dL 6.7 7.2 6.7  Total Bilirubin 0.3 - 1.2 mg/dL 0.5 0.7 0.7  Alkaline Phos 38 - 126 U/L 95 84 82  AST 15 - 41 U/L _2 ALT 0 - 44 U/L _3 DIAGNOSTIC IMAGING:  I have independently reviewed the scans and discussed with the patient. No results found.   ASSESSMENT:  1.  Stage IIIb large B-cell lymphoma: -Presentation  with weight loss, night sweats, adenopathy in the neck, abdomen and  right inguinal area -6 cycles of R-CHOP from 07/30/2016 through 11/12/2016 - Denies fevers, night sweats or weight loss.  Denies any recurrent infections or hospitalizations. -CT CAP on 06/21/2018 did not show any evidence of adenopathy in the chest, abdomen and pelvis.  Innumerable new groundglass nodules in both lungs.   2.  Left colon thickening: -Her last colonoscopy was in 1999. - Her last CT scan showed thickening of the sigmoid colon area.  The current CT scan on 06/21/2018 showed improved appearance of the colon since 12/21/2017.  She denies any bleeding per rectum or melena.  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   3.  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 - We have renewed her gabapentin.     PLAN:  1.  Stage IIIb large B-cell lymphoma: - Denies any fevers, night sweats or weight loss. - Physical examination today did not reveal any palpable adenopathy. - Reviewed labs from 09/11/2021 which showed LDH was normal and normal LFTs. - She has mild erythrocytosis stable. - RTC 12 months for follow-up with repeat labs and exam.   2.  Smoking history: - CT chest without contrast on 03/06/2021 showed small pulmonary nodules stable compared to prior exam, definitely benign.  No lymphadenopathy.  Other noncardiac findings were discussed. - Recommend low-dose CT in August 2023 followed by phone visit.   3.  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  Erythropoietin was 7.8. - Most likely reason for erythrocytosis is smoking.   4.  B12 deficiency: - Continue B12 1 mg tablet daily.   Orders placed this encounter:  No orders of the defined types were placed in this encounter.    Derek Jack, MD [place] [phone]   I,  Thana Ates, am acting as a scribe for Dr. Derek Jack.  I, Derek Jack MD, have  reviewed the above documentation for accuracy and completeness, and I agree with the above.

## 2021-10-08 ENCOUNTER — Telehealth (HOSPITAL_COMMUNITY): Payer: Self-pay | Admitting: Cardiovascular Disease

## 2021-10-08 ENCOUNTER — Telehealth: Payer: Self-pay | Admitting: Cardiovascular Disease

## 2021-10-08 DIAGNOSIS — R072 Precordial pain: Secondary | ICD-10-CM

## 2021-10-08 NOTE — Telephone Encounter (Signed)
Attempted to return call to patient, unable to reach patient or leave message at this time due to VM being full. Orders for repeat testing entered and message sent to scheduling.  ?

## 2021-10-08 NOTE — Telephone Encounter (Signed)
Returned call to patient who states that she would like to do the testing that Dr. [name] recommended for her back in December. Patient states she was sick on and off for a month and then had a death in the family. Patient states her significant other is having a lung transplant coming up and she wants to have this done before she has to help care for her significant other. Advised patient that since orders were removed all together I will forward message to Dr. [name] to ensure it is okay to re order Echo and Myocardial Perfusion Test. Patient verbalized understanding.  ?

## 2021-10-08 NOTE — Telephone Encounter (Signed)
Patient was calling to schedule her Echo and Myocardial perfusion.  Please place orders for them so she can be scheduled. Thanks ?

## 2021-10-08 NOTE — Telephone Encounter (Signed)
Patient cancelled echo and Myoview for reason below: ? ?08/25/21 Spoke with patient and she is not better and will call us at a later date to reschedule @ 3:32/LBW  ?08/13/2021 12:56 PM By: ?By: Gardiner Coins ?Gabriella Love  ?Cancel Rsn: Patient (Patient still sick) ? ?Order will be removed from the active wq and if patient calls back to reschedule we will reinstate the order. Thank you ?

## 2021-10-09 ENCOUNTER — Telehealth (HOSPITAL_COMMUNITY): Payer: Self-pay | Admitting: *Deleted

## 2021-10-09 NOTE — Telephone Encounter (Signed)
Patient given detailed instructions per Myocardial Perfusion Study Information Sheet for the test on 10/10/21 Patient notified to arrive 15 minutes early and that it is imperative to arrive on time for appointment to keep from having the test rescheduled. ? If you need to cancel or reschedule your appointment, please call the office within 24 hours of your appointment. . Patient verbalized understanding. Kirstie Peri ? ? ?

## 2021-10-10 ENCOUNTER — Ambulatory Visit (HOSPITAL_COMMUNITY): Payer: Medicare Other | Attending: Cardiology

## 2021-10-10 ENCOUNTER — Other Ambulatory Visit: Payer: Self-pay

## 2021-10-10 DIAGNOSIS — R072 Precordial pain: Secondary | ICD-10-CM | POA: Insufficient documentation

## 2021-10-10 LAB — MYOCARDIAL PERFUSION IMAGING
Angina Index: 0
Base ST Depression (mm): 0 mm
Duke Treadmill Score: 5
Estimated workload: 7
Exercise duration (min): 5 min
Exercise duration (sec): 0 s
LV dias vol: 69 mL (ref 46–106)
LV sys vol: 38 mL
MPHR: 156 {beats}/min
Nuc Stress EF: 45 %
Peak HR: 144 {beats}/min
Percent HR: 92 %
Rest HR: 88 {beats}/min
Rest Nuclear Isotope Dose: 10.2 mCi
SDS: 3
SRS: 0
SSS: 3
ST Depression (mm): 0 mm
Stress Nuclear Isotope Dose: 30.6 mCi
TID: 1.1

## 2021-10-10 MED ORDER — TECHNETIUM TC 99M TETROFOSMIN IV KIT
30.6000 | PACK | Freq: Once | INTRAVENOUS | Status: AC | PRN
  Administered 2021-10-10: 30.6 via INTRAVENOUS
  Filled 2021-10-10: qty 31

## 2021-10-10 MED ORDER — TECHNETIUM TC 99M TETROFOSMIN IV KIT
10.2000 | PACK | Freq: Once | INTRAVENOUS | Status: AC | PRN
  Administered 2021-10-10: 10.2 via INTRAVENOUS
  Filled 2021-10-10: qty 11

## 2021-10-13 ENCOUNTER — Other Ambulatory Visit: Payer: Self-pay

## 2021-10-13 ENCOUNTER — Ambulatory Visit (HOSPITAL_COMMUNITY): Payer: Medicare Other | Attending: Cardiology

## 2021-10-13 DIAGNOSIS — R072 Precordial pain: Secondary | ICD-10-CM | POA: Diagnosis not present

## 2021-10-13 DIAGNOSIS — R079 Chest pain, unspecified: Secondary | ICD-10-CM

## 2021-10-13 LAB — ECHOCARDIOGRAM COMPLETE
Area-P 1/2: 4.6 cm2
S' Lateral: 3.7 cm

## 2021-10-14 ENCOUNTER — Encounter: Payer: Self-pay | Admitting: *Deleted

## 2021-12-02 DIAGNOSIS — E782 Mixed hyperlipidemia: Secondary | ICD-10-CM | POA: Diagnosis not present

## 2021-12-02 DIAGNOSIS — R7301 Impaired fasting glucose: Secondary | ICD-10-CM | POA: Diagnosis not present

## 2021-12-09 DIAGNOSIS — I700 Atherosclerosis of aorta: Secondary | ICD-10-CM | POA: Diagnosis not present

## 2021-12-09 DIAGNOSIS — K219 Gastro-esophageal reflux disease without esophagitis: Secondary | ICD-10-CM | POA: Diagnosis not present

## 2021-12-09 DIAGNOSIS — E782 Mixed hyperlipidemia: Secondary | ICD-10-CM | POA: Diagnosis not present

## 2021-12-09 DIAGNOSIS — F17219 Nicotine dependence, cigarettes, with unspecified nicotine-induced disorders: Secondary | ICD-10-CM | POA: Diagnosis not present

## 2021-12-09 DIAGNOSIS — M5442 Lumbago with sciatica, left side: Secondary | ICD-10-CM | POA: Diagnosis not present

## 2021-12-09 DIAGNOSIS — G620 Drug-induced polyneuropathy: Secondary | ICD-10-CM | POA: Diagnosis not present

## 2021-12-09 DIAGNOSIS — C8330 Diffuse large B-cell lymphoma, unspecified site: Secondary | ICD-10-CM | POA: Diagnosis not present

## 2021-12-09 DIAGNOSIS — K7689 Other specified diseases of liver: Secondary | ICD-10-CM | POA: Diagnosis not present

## 2021-12-09 DIAGNOSIS — I10 Essential (primary) hypertension: Secondary | ICD-10-CM | POA: Diagnosis not present

## 2021-12-09 DIAGNOSIS — I2510 Atherosclerotic heart disease of native coronary artery without angina pectoris: Secondary | ICD-10-CM | POA: Diagnosis not present

## 2021-12-29 DIAGNOSIS — M5442 Lumbago with sciatica, left side: Secondary | ICD-10-CM | POA: Diagnosis not present

## 2021-12-29 DIAGNOSIS — R0609 Other forms of dyspnea: Secondary | ICD-10-CM | POA: Diagnosis not present

## 2021-12-29 DIAGNOSIS — G620 Drug-induced polyneuropathy: Secondary | ICD-10-CM | POA: Diagnosis not present

## 2021-12-29 DIAGNOSIS — F17219 Nicotine dependence, cigarettes, with unspecified nicotine-induced disorders: Secondary | ICD-10-CM | POA: Diagnosis not present

## 2021-12-29 DIAGNOSIS — I700 Atherosclerosis of aorta: Secondary | ICD-10-CM | POA: Diagnosis not present

## 2021-12-29 DIAGNOSIS — I10 Essential (primary) hypertension: Secondary | ICD-10-CM | POA: Diagnosis not present

## 2021-12-29 DIAGNOSIS — B351 Tinea unguium: Secondary | ICD-10-CM | POA: Diagnosis not present

## 2022-02-11 ENCOUNTER — Telehealth: Payer: Self-pay | Admitting: Cardiology

## 2022-02-11 NOTE — Telephone Encounter (Signed)
   Pt requesting to speak with Dr. Jacalyn Lefevre nurse. She said, she doesn't know how to explain her concern and maybe if she tell it to the nurse she will understand what she needs.

## 2022-02-11 NOTE — Telephone Encounter (Signed)
Spoke to patient, patient reports she received a letter in the mail today from insurance company stating Dr. Margaretann Loveless has asked them to pay for a nuclear medicine imaging test, dates 01/05-02/19.  She is very concerned as she has never seen Dr.Acharya and did not have a test during these dates.   She states her insurance changed this year and her current active insurance has already paid.      Advised would send to billing department for further information.  Patient very upset/concerned and would like a call to discuss.

## 2022-02-14 ENCOUNTER — Encounter: Payer: Self-pay | Admitting: Emergency Medicine

## 2022-02-14 ENCOUNTER — Ambulatory Visit
Admission: EM | Admit: 2022-02-14 | Discharge: 2022-02-14 | Disposition: A | Discharge status: Home / Self Care | Payer: Medicare Other | Attending: Nurse Practitioner | Admitting: Nurse Practitioner

## 2022-02-14 DIAGNOSIS — R42 Dizziness and giddiness: Secondary | ICD-10-CM

## 2022-02-14 DIAGNOSIS — H6981 Other specified disorders of Eustachian tube, right ear: Secondary | ICD-10-CM | POA: Diagnosis not present

## 2022-02-14 MED ORDER — MECLIZINE HCL 12.5 MG PO TABS: 12.5000 mg | ORAL_TABLET | Freq: Three times a day (TID) | ORAL | 0 refills | Status: DC | PRN

## 2022-02-14 MED ORDER — PREDNISONE 20 MG PO TABS: 40.0000 mg | ORAL_TABLET | Freq: Every day | ORAL | 0 refills | Status: AC

## 2022-02-14 NOTE — ED Triage Notes (Signed)
Pain behind right ear x 1 week.  States she has been lightheaded and dizzy.  States ear is tender.

## 2022-02-14 NOTE — ED Provider Notes (Signed)
[facility]    CSN: [id number] Arrival date & time: 02/14/22  3532      History   Chief Complaint No chief complaint on file.   HPI Gabriella Love is a 66 y.o. female.   The history is provided by the patient.   Patient presents for complaints of right ear pain and dizziness that been present for the past week.  Patient reports a previous history of right eustachian tube dysfunction.  States that she was placed on Flonase by ear nose and throat.  She states since that time she is continue to use the Flonase for the ongoing problem.  She states over the past week, she has had increased pain in the right ear along with chills and dizziness.  She states the dizziness occurs sporadically and last and episodes from 2 to 10 minutes.  She states that she has episodes where she feels like she is spinning.  She also complains of nausea.  She denies fever, headache, ear drainage, sore throat, chest pain, vomiting, blurred vision, decreased vision, or change in vision.  She also states that she takes Xyzal at night for her allergies.  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   GERD (gastroesophageal reflux disease)    Hyperlipidemia    Hypertension    Irritable bowel syndrome (IBS)    with diarrhea   Myocardial infarction ([place]) 12/19/2009   released from cardiology    Patient Active Problem List   Diagnosis Date Noted   Lymphoma, large-cell, diffuse ([place]) 07/16/2016   Cervicitis 08/30/2013   Vaginitis and vulvovaginitis, unspecified 08/15/2013   Nausea 11/21/2011   Abdominal pain 03/26/2011   Acid reflux 02/26/2011   Abdominal pain, epigastric 02/26/2011   BRONCHITIS, CHRONIC 05/14/2010   NEVUS 04/08/2010   EUSTACHIAN TUBE DYSFUNCTION 02/13/2010   Essential hypertension 01/16/2010   Coronary  atherosclerosis 01/16/2010   ECCHYMOSES, SPONTANEOUS 01/16/2010   HYPERLIPIDEMIA 12/30/2007   ANXIETY DEPRESSION 12/30/2007   TOBACCO ABUSE 12/02/2007   MITRAL VALVE PROLAPSE 12/02/2007   ALLERGIC RHINITIS 12/02/2007   IBS 12/02/2007    Past Surgical History:  Procedure Laterality Date   CARDIAC CATHETERIZATION     cardiac stent   CHOLECYSTECTOMY     CORONARY STENT PLACEMENT  2011   INGUINAL LYMPH NODE BIOPSY Left 07/13/2016   Procedure: EXCISIONAL BIOPSY OF LEFT INGUINAL LYMPH NODE;  Surgeon: Vickie Epley, MD;  Location: [facility];  Service: General;  Laterality: Left;   PORT-A-CATH REMOVAL Right 05/20/2018   Procedure: MINOR REMOVAL PORT-A-CATH;  Surgeon: Aviva Signs, MD;  Location: [facility];  Service: General;  Laterality: Right;   PORTACATH PLACEMENT N/A 07/17/2016   Procedure: INSERTION OF TUNNELED CENTRAL VENOUS CATHETER WITH SUBCUTANEOUS PORT;  Surgeon: Vickie Epley, MD;  Location: [facility];  Service: Vascular;  Laterality: N/A;   TUBAL LIGATION      OB History   No obstetric history on file.      Home Medications    Prior to Admission medications   Medication Sig Start Date End Date Taking? Authorizing Provider  meclizine (ANTIVERT) 12.5 MG tablet Take 1 tablet (12.5 mg total) by mouth 3 (three) times daily as needed for dizziness. 02/14/22  Yes Witney Huie-Warren, Alda Lea, NP  predniSONE (DELTASONE) 20 MG tablet Take 2 tablets (40 mg total) by  mouth daily with breakfast for 5 days. 02/14/22 02/19/22 Yes Maygan Koeller-Warren, Alda Lea, NP  albuterol (VENTOLIN HFA) 108 (90 Base) MCG/ACT inhaler  07/14/19   [provider]  ALPRAZolam Duanne Moron) 0.5 MG tablet Take 0.25-0.5 mg by mouth daily as needed for anxiety. 05/08/16   [provider]  aspirin 81 MG chewable tablet  12/25/13   [provider]  buPROPion (WELLBUTRIN XL) 150 MG 24 hr tablet Take 150 mg by mouth daily.    [provider]  fluticasone (FLONASE) 50 MCG/ACT nasal spray Place into  both nostrils daily.    [provider]  gabapentin (NEURONTIN) 300 MG capsule Take 300 mg by mouth 3 (three) times daily. 12/18/20   [provider]  pantoprazole (PROTONIX) 40 MG tablet Take 40 mg by mouth daily. Pt takes at supper daily    [provider]  rosuvastatin (CRESTOR) 40 MG tablet Take 1 tablet (40 mg total) by mouth daily. 07/11/21   O'Neal, Cassie Freer, MD    Family History Family History  Problem Relation Age of Onset   Cancer Mother        colon   Depression Mother    Heart disease Father    Depression Sister    Cancer Sister        leukemia   Hyperlipidemia Brother    Hypertension Brother     Social History Social History   Tobacco Use   Smoking status: Every Day    Packs/day: 0.50    Years: 40.00    Total pack years: 20.00    Types: Cigarettes   Smokeless tobacco: Never  Substance Use Topics   Alcohol use: No   Drug use: No     Allergies   Amoxicillin, Choline fenofibrate, Ezetimibe, Penicillins, and Percocet [oxycodone-acetaminophen]   Review of Systems Review of Systems Per HPI  Physical Exam Triage Vital Signs ED Triage Vitals  Enc Vitals Group     BP 02/14/22 0815 (!) 152/78     Pulse Rate 02/14/22 0815 95     Resp 02/14/22 0815 18     Temp 02/14/22 0815 98.5 F (36.9 C)     Temp Source 02/14/22 0815 Oral     SpO2 02/14/22 0815 93 %     Weight --      Height --      Head Circumference --      Peak Flow --      Pain Score 02/14/22 0816 3     Pain Loc --      Pain Edu? --      Excl. in [place]? --    No data found.  Updated Vital Signs BP (!) 152/78 (BP Location: Right Arm)   Pulse 95   Temp 98.5 F (36.9 C) (Oral)   Resp 18   SpO2 93%   Visual Acuity Right Eye Distance:   Left Eye Distance:   Bilateral Distance:    Right Eye Near:   Left Eye Near:    Bilateral Near:     Physical Exam Vitals reviewed.  Constitutional:      General: She is not in acute distress.    Appearance: She is  well-developed.  HENT:     Head: Normocephalic.     Right Ear: Ear canal and external ear normal. A middle ear effusion is present.     Left Ear: Tympanic membrane, ear canal and external ear normal.  No middle ear effusion.     Nose: Nose normal.  Mouth/Throat:     Mouth: Mucous membranes are moist.  Eyes:     Extraocular Movements: Extraocular movements intact.     Conjunctiva/sclera: Conjunctivae normal.     Pupils: Pupils are equal, round, and reactive to light.  Cardiovascular:     Rate and Rhythm: Normal rate and regular rhythm.     Heart sounds: Normal heart sounds.  Pulmonary:     Effort: Pulmonary effort is normal.     Breath sounds: Normal breath sounds.  Abdominal:     General: Bowel sounds are normal. There is no distension.     Palpations: Abdomen is soft.     Tenderness: There is no abdominal tenderness. There is no guarding or rebound.  Genitourinary:    Vagina: Normal. No vaginal discharge.  Musculoskeletal:     Cervical back: Normal range of motion.  Lymphadenopathy:     Cervical: No cervical adenopathy.  Skin:    General: Skin is warm and dry.     Findings: No erythema or rash.  Neurological:     General: No focal deficit present.     Mental Status: She is alert and oriented to person, place, and time.     Cranial Nerves: No cranial nerve deficit.  Psychiatric:        Mood and Affect: Mood normal.        Behavior: Behavior normal.      UC Treatments / Results  Labs (all labs ordered are listed, but only abnormal results are displayed) Labs Reviewed - No data to display  EKG   Radiology No results found.  Procedures Procedures (including critical care time)  Medications Ordered in UC Medications - No data to display  Initial Impression / Assessment and Plan / UC Course  I have reviewed the triage vital signs and the nursing notes.  Pertinent labs & imaging results that were available during my care of the patient were reviewed by me  and considered in my medical decision making (see chart for details).  Patient presents for complaints of right ear pain and dizziness that been present for the past week.  On exam, patient has a right middle ear effusion, consistent with right eustachian tube dysfunction.  It is likely that her dizziness is being caused by the middle ear effusion.  We will treat patient's dizziness with meclizine and start her on prednisone for the eustachian tube dysfunction as she is currently taking Flonase and Xyzal.  Supportive care recommendations were provided to the patient.  Patient advised that she will need to follow-up with her PCP if her symptoms do not improve with this medication regimen. Final Clinical Impressions(s) / UC Diagnoses   Final diagnoses:  Acute dysfunction of right eustachian tube  Dizziness and giddiness     Discharge Instructions      Take medication as prescribed. Take medication as prescribed. May take Tylenol for pain, fever, or general discomfort. Warm compresses to the affected ear help with comfort. Do not stick anything inside the ear while symptoms persist. Avoid getting water inside of the ear while symptoms persist. Follow-up with your primary care physician if symptoms do not improve.     ED Prescriptions     Medication Sig Dispense Auth. Provider   meclizine (ANTIVERT) 12.5 MG tablet Take 1 tablet (12.5 mg total) by mouth 3 (three) times daily as needed for dizziness. 30 tablet Xandrea Clarey-Warren, Alda Lea, NP   predniSONE (DELTASONE) 20 MG tablet Take 2 tablets (40 mg total) by mouth daily with  breakfast for 5 days. 10 tablet Jiayi Lengacher-Warren, Alda Lea, NP      PDMP not reviewed this encounter.   Tish Men, NP 02/14/22 [phone]

## 2022-02-14 NOTE — Discharge Instructions (Addendum)
Take medication as prescribed. Take medication as prescribed. May take Tylenol for pain, fever, or general discomfort. Warm compresses to the affected ear help with comfort. Do not stick anything inside the ear while symptoms persist. Avoid getting water inside of the ear while symptoms persist. Follow-up with your primary care physician if symptoms do not improve.

## 2022-02-18 NOTE — Telephone Encounter (Signed)
Patient called back regarding message from 02/11/2022.  I first apologized that she had not been called back prior to today.  I explained that we did file her stress test from March to the correct [facility] and had been paid. The letter was from her old [place], which was not billed.  She understood and appreciated the explanation.

## 2022-02-24 DIAGNOSIS — R350 Frequency of micturition: Secondary | ICD-10-CM | POA: Diagnosis not present

## 2022-02-24 DIAGNOSIS — R42 Dizziness and giddiness: Secondary | ICD-10-CM | POA: Diagnosis not present

## 2022-02-24 DIAGNOSIS — L989 Disorder of the skin and subcutaneous tissue, unspecified: Secondary | ICD-10-CM | POA: Diagnosis not present

## 2022-03-10 ENCOUNTER — Ambulatory Visit (HOSPITAL_COMMUNITY): Payer: Medicare Other

## 2022-03-12 ENCOUNTER — Ambulatory Visit (HOSPITAL_COMMUNITY)
Admission: RE | Admit: 2022-03-12 | Discharge: 2022-03-12 | Disposition: A | Discharge status: Home / Self Care | Payer: Medicare Other | Source: Ambulatory Visit | Attending: Physician Assistant | Admitting: Physician Assistant

## 2022-03-12 DIAGNOSIS — F17210 Nicotine dependence, cigarettes, uncomplicated: Secondary | ICD-10-CM | POA: Diagnosis not present

## 2022-03-12 DIAGNOSIS — Z122 Encounter for screening for malignant neoplasm of respiratory organs: Secondary | ICD-10-CM | POA: Diagnosis not present

## 2022-03-12 DIAGNOSIS — Z87891 Personal history of nicotine dependence: Secondary | ICD-10-CM | POA: Diagnosis not present

## 2022-03-13 ENCOUNTER — Other Ambulatory Visit (HOSPITAL_COMMUNITY): Payer: Self-pay | Admitting: Internal Medicine

## 2022-03-13 DIAGNOSIS — R319 Hematuria, unspecified: Secondary | ICD-10-CM

## 2022-03-17 ENCOUNTER — Inpatient Hospital Stay: Payer: Medicare Other | Attending: Hematology | Admitting: Hematology

## 2022-03-17 ENCOUNTER — Encounter: Payer: Self-pay | Admitting: Hematology

## 2022-03-17 VITALS — Wt 123.0 lb

## 2022-03-17 DIAGNOSIS — C8330 Diffuse large B-cell lymphoma, unspecified site: Secondary | ICD-10-CM

## 2022-03-17 DIAGNOSIS — Z87891 Personal history of nicotine dependence: Secondary | ICD-10-CM | POA: Diagnosis not present

## 2022-03-17 DIAGNOSIS — D751 Secondary polycythemia: Secondary | ICD-10-CM

## 2022-03-17 DIAGNOSIS — F17200 Nicotine dependence, unspecified, uncomplicated: Secondary | ICD-10-CM

## 2022-03-17 DIAGNOSIS — E538 Deficiency of other specified B group vitamins: Secondary | ICD-10-CM | POA: Diagnosis not present

## 2022-03-17 NOTE — Progress Notes (Signed)
Virtual Visit via Telephone Note  I connected with Gabriella Love on 03/17/22 at  4:00 PM EDT by telephone and verified that I am speaking with the correct person using two identifiers.  Location: Patient: At home Provider: In the office   I discussed the limitations, risks, security and privacy concerns of performing an evaluation and management service by telephone and the availability of in person appointments. I also discussed with the patient that there may be a patient responsible charge related to this service. The patient expressed understanding and agreed to proceed.   History of Present Illness: This patient is seen in our clinic for follow-up of large B-cell lymphoma, erythrocytosis and history of smoking.   Observations/Objective: She reports that she has cut back on smoking.  She is taking B12 tablet daily.  Denies any fevers, night sweats or weight loss.  She is having urinary frequency issues and has follow-up with Dr. Jeffie Pollock.  Assessment and Plan:  1.  Smoking history: - I have reviewed CT lung cancer screening scan from 03/12/2022: Lung RADS 2S, with other findings including coronary artery calcifications.  No lung lesions were seen. - Recommend another low-dose chest CT in 12 months.  2.  Diffuse large B-cell lymphoma: - She does not have any B symptoms or palpable lymphadenopathy. - She has an appointment to see me in March of next year with repeat labs and exam.  3.  B12 deficiency: - Continue B12 tablet daily.   Follow Up Instructions: RTC in March 2024.   I discussed the assessment and treatment plan with the patient. The patient was provided an opportunity to ask questions and all were answered. The patient agreed with the plan and demonstrated an understanding of the instructions.   The patient was advised to call back or seek an in-person evaluation if the symptoms worsen or if the condition fails to improve as anticipated.  I provided 21 minutes of  non-face-to-face time during this encounter.   Derek Jack, MD

## 2022-03-24 DIAGNOSIS — L989 Disorder of the skin and subcutaneous tissue, unspecified: Secondary | ICD-10-CM | POA: Diagnosis not present

## 2022-03-24 DIAGNOSIS — C8330 Diffuse large B-cell lymphoma, unspecified site: Secondary | ICD-10-CM | POA: Diagnosis not present

## 2022-04-02 ENCOUNTER — Ambulatory Visit (HOSPITAL_COMMUNITY)
Admission: RE | Admit: 2022-04-02 | Discharge: 2022-04-02 | Disposition: A | Discharge status: Home / Self Care | Payer: Medicare Other | Source: Ambulatory Visit | Attending: Internal Medicine | Admitting: Internal Medicine

## 2022-04-02 DIAGNOSIS — R319 Hematuria, unspecified: Secondary | ICD-10-CM | POA: Diagnosis not present

## 2022-04-02 DIAGNOSIS — R350 Frequency of micturition: Secondary | ICD-10-CM | POA: Diagnosis not present

## 2022-04-02 DIAGNOSIS — M47817 Spondylosis without myelopathy or radiculopathy, lumbosacral region: Secondary | ICD-10-CM | POA: Diagnosis not present

## 2022-04-02 DIAGNOSIS — I700 Atherosclerosis of aorta: Secondary | ICD-10-CM | POA: Diagnosis not present

## 2022-04-02 DIAGNOSIS — I714 Abdominal aortic aneurysm, without rupture, unspecified: Secondary | ICD-10-CM | POA: Diagnosis not present

## 2022-04-23 ENCOUNTER — Ambulatory Visit (INDEPENDENT_AMBULATORY_CARE_PROVIDER_SITE_OTHER): Payer: Medicare Other | Admitting: Urology

## 2022-04-23 ENCOUNTER — Encounter: Payer: Self-pay | Admitting: Urology

## 2022-04-23 VITALS — BP 145/84 | HR 106

## 2022-04-23 DIAGNOSIS — R3129 Other microscopic hematuria: Secondary | ICD-10-CM

## 2022-04-23 DIAGNOSIS — R350 Frequency of micturition: Secondary | ICD-10-CM

## 2022-04-23 DIAGNOSIS — R351 Nocturia: Secondary | ICD-10-CM

## 2022-04-23 LAB — URINALYSIS, ROUTINE W REFLEX MICROSCOPIC
Bilirubin, UA: NEGATIVE
Glucose, UA: NEGATIVE
Ketones, UA: NEGATIVE
Leukocytes,UA: NEGATIVE
Nitrite, UA: NEGATIVE
Protein,UA: NEGATIVE
Specific Gravity, UA: 1.01 (ref 1.005–1.030)
Urobilinogen, Ur: 0.2 mg/dL (ref 0.2–1.0)
pH, UA: 6 (ref 5.0–7.5)

## 2022-04-23 LAB — POCT URINALYSIS DIPSTICK
Bilirubin, UA: NEGATIVE
Glucose, UA: NEGATIVE
Ketones, UA: NEGATIVE
Leukocytes, UA: NEGATIVE
Nitrite, UA: NEGATIVE
Protein, UA: NEGATIVE
Spec Grav, UA: <=1.005 — AB (ref 1.010–1.025)
Urobilinogen, UA: 0.2 E.U./dL
pH, UA: 5 (ref 5.0–8.0)

## 2022-04-23 LAB — MICROSCOPIC EXAMINATION: Bacteria, UA: NONE SEEN

## 2022-04-23 NOTE — Progress Notes (Signed)
Subjective: 1. Urinary frequency   2. Microhematuria   3. Nocturia      Consult requested by Dr. Allyn Kenner   Gabriella Love is a 66 yo female who is sent by Dr. Nevada Crane for recurrent hematuria and urinary frequency.   She had a CT AP w/o contrast on 04/02/22 that showed mild fullness of the right collecting system but no stones or bladder issues.   She had 2+ blood on a recent dip UA after a fall but  a repeat 2 weeks later was clear.  Her Cr was normal on the last labs I see from 2/23.   Her UA today has trace blood.  Shehas had some frequency that has been progressive over the last 2 years, particularly over the last 6 months.  She has a history of non-Hodgkin's lymphoma in 2018 and was treated with chemo.   She voids about 8x daily and 7-8x at night.  It is variable.  She has had no gross hematuria.  Her PVR is 46m.  She has no incontinence but has a sensation of fullness that is relieved by voiding.  She has no pain.   She was given Cytoxan as part of her chemo regimen.  She didn't get [place].   She has had no recent UTI's.   She has had no GU surgery.   She has had no stones.    She is a smoker.   She was treated for OAB about 40 years ago after her children were born.   She is on gabapentin for post chemo neuropathy.   G2P2 NVD ROS:  Review of Systems  Respiratory:  Positive for shortness of breath.   Genitourinary:  Positive for frequency.  Endo/Heme/Allergies:  Bruises/bleeds easily.  All other systems reviewed and are negative.   Allergies  Allergen Reactions   Amoxicillin Nausea And Vomiting    Yeast Infections  Has patient had a PCN reaction causing immediate rash, facial/tongue/throat swelling, SOB or lightheadedness with hypotension: No Has patient had a PCN reaction causing severe rash involving mucus membranes or skin necrosis: No Has patient had a PCN reaction that required hospitalization No Has patient had a PCN reaction occurring within the last 10 years: No If all of the above  answers are "NO", then may proceed with Cephalosporin use.    Choline Fenofibrate     REACTION: Abd pain and elevated Lipase   Ezetimibe Nausea And Vomiting   Penicillins     Yeast infections, thrush  Has patient had a PCN reaction causing immediate rash, facial/tongue/throat swelling, SOB or lightheadedness with hypotension: No Has patient had a PCN reaction causing severe rash involving mucus membranes or skin necrosis: No Has patient had a PCN reaction that required hospitalization No Has patient had a PCN reaction occurring within the last 10 years: No If all of the above answers are "NO", then may proceed with Cephalosporin use.    Percocet [Oxycodone-Acetaminophen] Nausea And Vomiting    Past Medical History:  Diagnosis Date   Anxiety    Arthritis    CAD (coronary artery disease)    COPD (chronic obstructive pulmonary disease) (HCC)    [place]   GERD (gastroesophageal reflux disease)    Hyperlipidemia    Hypertension    Irritable bowel syndrome (IBS)    with diarrhea   [place]   released from cardiology    Past Surgical History:  Procedure Laterality Date   CARDIAC CATHETERIZATION  cardiac stent   CHOLECYSTECTOMY     CORONARY STENT PLACEMENT  2011   INGUINAL LYMPH NODE BIOPSY Left 07/13/2016   Procedure: EXCISIONAL BIOPSY OF LEFT INGUINAL LYMPH NODE;  Surgeon: Vickie Epley, MD;  Location: [facility];  Service: General;  Laterality: Left;   PORT-A-CATH REMOVAL Right 05/20/2018   Procedure: MINOR REMOVAL PORT-A-CATH;  Surgeon: Aviva Signs, MD;  Location: [facility];  Service: General;  Laterality: Right;   PORTACATH PLACEMENT N/A 07/17/2016   Procedure: INSERTION OF TUNNELED CENTRAL VENOUS CATHETER WITH SUBCUTANEOUS PORT;  Surgeon: Vickie Epley, MD;  Location: [facility];  Service: Vascular;  Laterality: N/A;   TUBAL LIGATION      Social History   Socioeconomic History   Marital  status: Single    Spouse name: Not on file   Number of children: 2   Years of education: Not on file   Highest education level: Not on file  Occupational History   Not on file  Tobacco Use   Smoking status: Every Day    Packs/day: 0.50    Years: 40.00    Total pack years: 20.00    Types: Cigarettes   Smokeless tobacco: Never  Substance and Sexual Activity   Alcohol use: No   Drug use: No   Sexual activity: Yes    Birth control/protection: Post-menopausal  Other Topics Concern   Not on file  Social History Narrative   Not on file   Social Determinants of Health   Financial Resource Strain: Not on file  Food Insecurity: Not on file  Transportation Needs: Not on file  Physical Activity: Not on file  Stress: Not on file  Social Connections: Not on file  Intimate Partner Violence: Not on file    Family History  Problem Relation Age of Onset   Cancer Mother        colon   Depression Mother    Heart disease Father    Depression Sister    Cancer Sister        leukemia   Hyperlipidemia Brother    Hypertension Brother     Anti-infectives: Anti-infectives (From admission, onward)    None       Current Outpatient Medications  Medication Sig Dispense Refill   albuterol (VENTOLIN HFA) 108 (90 Base) MCG/ACT inhaler      ALPRAZolam (XANAX) 0.5 MG tablet Take 0.25-0.5 mg by mouth daily as needed for anxiety.     aspirin 81 MG chewable tablet      buPROPion (WELLBUTRIN XL) 150 MG 24 hr tablet Take 150 mg by mouth daily.     fluticasone (FLONASE) 50 MCG/ACT nasal spray Place into both nostrils daily.     gabapentin (NEURONTIN) 100 MG capsule Take 1 capsule by mouth in the morning and at bedtime.     pantoprazole (PROTONIX) 40 MG tablet Take 40 mg by mouth daily. Pt takes at supper daily     rosuvastatin (CRESTOR) 40 MG tablet Take 1 tablet (40 mg total) by mouth daily. 90 tablet 3   No current facility-administered medications for this visit.     Objective: Vital  signs in last 24 hours: BP (!) 145/84   Pulse (!) 106   Intake/Output from previous day: No intake/output data recorded. Intake/Output this shift: '@IOTHISSHIFT'$ @   Physical Exam Vitals reviewed.  Constitutional:      Appearance: Normal appearance.  Cardiovascular:     Rate and Rhythm: Normal rate and regular rhythm.     Heart sounds: Normal heart  sounds.  Pulmonary:     Effort: Pulmonary effort is normal.     Breath sounds: Normal breath sounds.  Abdominal:     General: Abdomen is flat.     Palpations: Abdomen is soft.     Tenderness: There is no abdominal tenderness.  Genitourinary:    Comments: She declined an exam Skin:    General: Skin is warm and dry.  Neurological:     General: No focal deficit present.     Mental Status: She is alert and oriented to person, place, and time.  Psychiatric:        Mood and Affect: Mood normal.        Behavior: Behavior normal.     Lab Results:  Results for orders placed or performed in visit on 04/23/22 (from the past 24 hour(s))  POCT urinalysis dipstick     Status: Abnormal   Collection Time: 04/23/22  9:01 AM  Result Value Ref Range   Color, UA yellow    Clarity, UA clear    Glucose, UA Negative Negative   Bilirubin, UA negative    Ketones, UA negative    Spec Grav, UA <=1.005 (A) 1.010 - 1.025   Blood, UA trace hemolyzed    pH, UA 5.0 5.0 - 8.0   Protein, UA Negative Negative   Urobilinogen, UA 0.2 0.2 or 1.0 E.U./dL   Nitrite, UA negative    Leukocytes, UA Negative Negative   Appearance     Odor      BMET No results for input(s): "NA", "K", "CL", "CO2", "GLUCOSE", "BUN", "CREATININE", "CALCIUM" in the last 72 hours. PT/INR No results for input(s): "LABPROT", "INR" in the last 72 hours. ABG No results for input(s): "PHART", "HCO3" in the last 72 hours.  Invalid input(s): "PCO2", "PO2"  Studies/Results: No results found.   Assessment/Plan: OAB dry with microhematuria.   She is a smoker and has had prior  cytoxan which can increase the risk of bladder cancer and bladder fibrosis.   I am going to get a CT AP with contrast to complete her upper tract evaluation since she had some fullness on the right.   I will have her return in a month for cystoscopy to assess the bladder lining and will send a cytology today.  I have given her Gemtesa samples for the OAB symptoms and have reviewed the instructions and side effects.    No orders of the defined types were placed in this encounter.    Orders Placed This Encounter  Procedures   CT ABDOMEN PELVIS W CONTRAST    She had a non-contrast CT and had some fullness of the right collecting system.  She needs the CT with delays as with a hematuria study to complete the evaluation of the fullness and hematuria.    Standing Status:   Future    Standing Expiration Date:   04/24/2023    Order Specific Question:   If indicated for the ordered procedure, I authorize the administration of contrast media per Radiology protocol    Answer:   Yes    Order Specific Question:   Preferred imaging location?    Answer:   [facility]    Order Specific Question:   Is Oral Contrast requested for this exam?    Answer:   No oral contrast    Order Specific Question:   Reason for No Oral Contrast    Answer:   Other    Order Specific Question:   Please answer  why no oral contrast is requested    Answer:   not needed    Order Specific Question:   Radiology Contrast Protocol - do NOT remove file path    Answer:   [email or web address]   Urinalysis, Routine w reflex microscopic   POCT urinalysis dipstick   BLADDER SCAN AMB NON-IMAGING     Return in about 4 weeks (around 05/21/2022) for for cystoscopy with CT results. .    CC: Dr. Allyn Kenner.      Irine Seal 04/23/2022

## 2022-04-23 NOTE — Progress Notes (Signed)
post void residual=41

## 2022-04-28 ENCOUNTER — Telehealth: Payer: Self-pay

## 2022-04-28 LAB — CYTOLOGY, URINE

## 2022-04-28 NOTE — Telephone Encounter (Signed)
-----   Message from Irine Seal, MD sent at 04/28/2022  4:24 PM EDT ----- The cytology is negative which is good.  ----- Message ----- From: Sherrilyn Rist, CMA Sent: 04/28/2022   3:53 PM EDT To: Irine Seal, MD  Please Review

## 2022-04-28 NOTE — Telephone Encounter (Signed)
Made patient aware that her cytology is negative which is good news and patient voiced understanding

## 2022-05-20 ENCOUNTER — Ambulatory Visit (HOSPITAL_COMMUNITY)
Admission: RE | Admit: 2022-05-20 | Discharge: 2022-05-20 | Disposition: A | Discharge status: Home / Self Care | Payer: Medicare Other | Source: Ambulatory Visit | Attending: Urology | Admitting: Urology

## 2022-05-20 DIAGNOSIS — K6389 Other specified diseases of intestine: Secondary | ICD-10-CM | POA: Diagnosis not present

## 2022-05-20 DIAGNOSIS — R3129 Other microscopic hematuria: Secondary | ICD-10-CM | POA: Insufficient documentation

## 2022-05-20 DIAGNOSIS — I700 Atherosclerosis of aorta: Secondary | ICD-10-CM | POA: Diagnosis not present

## 2022-05-20 DIAGNOSIS — R319 Hematuria, unspecified: Secondary | ICD-10-CM | POA: Diagnosis not present

## 2022-05-20 MED ORDER — IOHEXOL 300 MG/ML  SOLN
125.0000 mL | Freq: Once | INTRAMUSCULAR | Status: AC | PRN
  Administered 2022-05-20: 125 mL via INTRAVENOUS

## 2022-05-25 ENCOUNTER — Telehealth: Payer: Self-pay

## 2022-05-25 NOTE — Telephone Encounter (Signed)
-----   Message from Irine Seal, MD sent at 05/25/2022 12:11 PM EDT ----- No urinary abnormalities noted.   F/u as planned.  Please send copy to patients PCP.  ----- Message ----- From: Audie Box, CMA Sent: 05/25/2022  11:18 AM EDT To: Irine Seal, MD  Please review.

## 2022-05-25 NOTE — Telephone Encounter (Signed)
Patient aware of MD response to CT.  CT report faxed to PCP per Dr. Jeffie Pollock

## 2022-05-25 NOTE — Telephone Encounter (Signed)
Patient called to see if MD had reviewed her CT results.  Results sent to MD and waiting for review.  Patient states she is having a lot of abdominal pain, urine frequency and has had a low grade fever of 99.6 for the last few days.  Consulted PA [place] in office, after reviewing CT she recommends ER or PCP evaluation as she does not think her symptoms are urinary related.  Patient informed of PA's recommendation and voiced understanding.  Patient also aware we will reach back out once MD reviews CT.

## 2022-05-26 DIAGNOSIS — R109 Unspecified abdominal pain: Secondary | ICD-10-CM | POA: Diagnosis not present

## 2022-05-26 DIAGNOSIS — N3281 Overactive bladder: Secondary | ICD-10-CM | POA: Diagnosis not present

## 2022-06-04 ENCOUNTER — Ambulatory Visit (INDEPENDENT_AMBULATORY_CARE_PROVIDER_SITE_OTHER): Payer: Medicare Other | Admitting: Urology

## 2022-06-04 VITALS — BP 139/78 | HR 106

## 2022-06-04 DIAGNOSIS — R3129 Other microscopic hematuria: Secondary | ICD-10-CM | POA: Diagnosis not present

## 2022-06-04 DIAGNOSIS — R350 Frequency of micturition: Secondary | ICD-10-CM | POA: Diagnosis not present

## 2022-06-04 DIAGNOSIS — N3281 Overactive bladder: Secondary | ICD-10-CM | POA: Diagnosis not present

## 2022-06-04 NOTE — Progress Notes (Signed)
Subjective: 1. Urinary frequency   2. Microhematuria      Consult requested by Dr. Allyn Kenner   04/23/22: Gabriella Love is a 66 yo female who is sent by Dr. Nevada Crane for recurrent hematuria and urinary frequency.   She had a CT AP w/o contrast on 04/02/22 that showed mild fullness of the right collecting system but no stones or bladder issues.   She had 2+ blood on a recent dip UA after a fall but  a repeat 2 weeks later was clear.  Her Cr was normal on the last labs I see from 2/23.   Her UA today has trace blood.  Shehas had some frequency that has been progressive over the last 2 years, particularly over the last 6 months.  She has a history of non-Hodgkin's lymphoma in 2018 and was treated with chemo.   She voids about 8x daily and 7-8x at night.  It is variable.  She has had no gross hematuria.  Her PVR is 48m.  She has no incontinence but has a sensation of fullness that is relieved by voiding.  She has no pain.   She was given Cytoxan as part of her chemo regimen.  She didn't get [place].   She has had no recent UTI's.   She has had no GU surgery.   She has had no stones.    She is a smoker.   She was treated for OAB about 40 years ago after her children were born.   She is on gabapentin for post chemo neuropathy.   G2P2 NVD.  06/04/22:  Gabriella Love returns today in f/u for consideration of cystoscopy.  She had no GU findings on her CT.  Her last UA was clear and her voiding symptoms have largely improved but she has 3-10 RBC's on the UA today.  She didn't tolerate the Gemtesa.  She was given antibiotics for her colitis and that has helped her symptoms.  ROS:  Review of Systems  Respiratory:  Positive for shortness of breath (improving.).   Endo/Heme/Allergies:  Bruises/bleeds easily.  All other systems reviewed and are negative.   Allergies  Allergen Reactions   Amoxicillin Nausea And Vomiting    Yeast Infections  Has patient had a PCN reaction causing immediate rash, facial/tongue/throat swelling, SOB or  lightheadedness with hypotension: No Has patient had a PCN reaction causing severe rash involving mucus membranes or skin necrosis: No Has patient had a PCN reaction that required hospitalization No Has patient had a PCN reaction occurring within the last 10 years: No If all of the above answers are "NO", then may proceed with Cephalosporin use.    Choline Fenofibrate     REACTION: Abd pain and elevated Lipase   Ezetimibe Nausea And Vomiting   Penicillins     Yeast infections, thrush  Has patient had a PCN reaction causing immediate rash, facial/tongue/throat swelling, SOB or lightheadedness with hypotension: No Has patient had a PCN reaction causing severe rash involving mucus membranes or skin necrosis: No Has patient had a PCN reaction that required hospitalization No Has patient had a PCN reaction occurring within the last 10 years: No If all of the above answers are "NO", then may proceed with Cephalosporin use.    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   GERD (gastroesophageal reflux disease)    Hyperlipidemia    Hypertension    Irritable bowel syndrome (IBS)    with diarrhea   Myocardial infarction ([place]) 12/19/2009   released from cardiology    Past Surgical History:  Procedure Laterality Date   CARDIAC CATHETERIZATION     cardiac stent   CHOLECYSTECTOMY     CORONARY STENT PLACEMENT  2011   INGUINAL LYMPH NODE BIOPSY Left 07/13/2016   Procedure: EXCISIONAL BIOPSY OF LEFT INGUINAL LYMPH NODE;  Surgeon: Vickie Epley, MD;  Location: [facility];  Service: General;  Laterality: Left;   PORT-A-CATH REMOVAL Right 05/20/2018   Procedure: MINOR REMOVAL PORT-A-CATH;  Surgeon: Aviva Signs, MD;  Location: [facility];  Service: General;  Laterality: Right;    PORTACATH PLACEMENT N/A 07/17/2016   Procedure: INSERTION OF TUNNELED CENTRAL VENOUS CATHETER WITH SUBCUTANEOUS PORT;  Surgeon: Vickie Epley, MD;  Location: [facility];  Service: Vascular;  Laterality: N/A;   TUBAL LIGATION      Social History   Socioeconomic History   Marital status: Single    Spouse name: Not on file   Number of children: 2   Years of education: Not on file   Highest education level: Not on file  Occupational History   Not on file  Tobacco Use   Smoking status: Every Day    Packs/day: 0.50    Years: 40.00    Total pack years: 20.00    Types: Cigarettes   Smokeless tobacco: Never  Substance and Sexual Activity   Alcohol use: No   Drug use: No   Sexual activity: Yes    Birth control/protection: Post-menopausal  Other Topics Concern   Not on file  Social History Narrative   Not on file   Social Determinants of Health   Financial Resource Strain: Not on file  Food Insecurity: Not on file  Transportation Needs: Not on file  Physical Activity: Not on file  Stress: Not on file  Social Connections: Not on file  Intimate Partner Violence: Not on file    Family History  Problem Relation Age of Onset   Cancer Mother        colon   Depression Mother    Heart disease Father    Depression Sister    Cancer Sister        leukemia   Hyperlipidemia Brother    Hypertension Brother     Anti-infectives: Anti-infectives (From admission, onward)    None       Current Outpatient Medications  Medication Sig Dispense Refill   albuterol (VENTOLIN HFA) 108 (90 Base) MCG/ACT inhaler      ALPRAZolam (XANAX) 0.5 MG tablet Take 0.25-0.5 mg by mouth daily as needed for anxiety.     aspirin 81 MG chewable tablet      buPROPion (WELLBUTRIN XL) 150 MG 24 hr tablet Take 150 mg by mouth daily.     fluticasone (FLONASE) 50 MCG/ACT nasal spray Place into both nostrils daily.     gabapentin (NEURONTIN) 100 MG capsule Take 1 capsule by mouth in the morning and at  bedtime.     pantoprazole (PROTONIX) 40 MG tablet Take 40 mg by mouth daily. Pt takes at supper daily     rosuvastatin (CRESTOR) 40 MG tablet Take 1 tablet (40 mg total) by mouth daily. 90 tablet 3   No current facility-administered medications for this visit.     Objective: Vital signs in last 24 hours: BP  139/78   Pulse (!) 106   Intake/Output from previous day: No intake/output data recorded. Intake/Output this shift: '@IOTHISSHIFT'$ @   Physical Exam Vitals reviewed.  Constitutional:      Appearance: Normal appearance.  Neurological:     Mental Status: She is alert.     Lab Results:  No results found for this or any previous visit (from the past 24 hour(s)).  UA has 3-10 RBC's.  BMET No results for input(s): "NA", "K", "CL", "CO2", "GLUCOSE", "BUN", "CREATININE", "CALCIUM" in the last 72 hours. PT/INR No results for input(s): "LABPROT", "INR" in the last 72 hours. ABG No results for input(s): "PHART", "HCO3" in the last 72 hours.  Invalid input(s): "PCO2", "PO2"  Studies/Results: No results found. CT ABDOMEN PELVIS W CONTRAST  Result Date: 05/22/2022 CLINICAL DATA:  Hematuria.  Increased urinary frequency. EXAM: CT ABDOMEN AND PELVIS WITH CONTRAST TECHNIQUE: Multidetector CT imaging of the abdomen and pelvis was performed using the standard protocol following bolus administration of intravenous contrast. RADIATION DOSE REDUCTION: This exam was performed according to the departmental dose-optimization program which includes automated exposure control, adjustment of the mA and/or kV according to patient size and/or use of iterative reconstruction technique. CONTRAST:  190m OMNIPAQUE IOHEXOL 300 MG/ML  SOLN COMPARISON:  04/02/2022 FINDINGS: Lower chest: No acute abnormality. Hepatobiliary: No focal liver abnormality is seen. No gallstones, gallbladder wall thickening, or biliary dilatation. There is mild fusiform increase caliber of the CBD measuring up to 8 mm. No CT  visible stones identified. No intrahepatic bile duct dilatation Pancreas: Unremarkable. No pancreatic ductal dilatation or surrounding inflammatory changes. Spleen: Normal in size without focal abnormality. Adrenals/Urinary Tract: Normal adrenal glands. No nephrolithiasis, hydronephrosis, or mass. No signs of hydroureter or ureteral lithiasis. No signs of obstructing stone or mass within the ureters. No suspicious filling defects identified within the collecting systems. No focal bladder abnormality. Stomach/Bowel: Stomach appears normal. The appendix is mild diffusely thickened measuring 8 mm in diameter, similar to the previous exam, image 51/6. There is wall thickening and mucosal enhancement involving the distal ileum. Mild diffuse wall thickening with intramural fatty deposition is identified throughout the colon. This is similar when compared with the examination from 04/02/2022. no signs of acute pericolonic fat stranding or free fluid. Vascular/Lymphatic: Aortic atherosclerosis. Infrarenal abdominal aortic aneurysm measures 3.3 cm, image 28/3. No signs of abdominopelvic adenopathy. Reproductive: Uterus and bilateral adnexa are unremarkable. Other: No signs of pneumoperitoneum. Musculoskeletal: No acute or significant osseous findings. IMPRESSION: 1. No acute findings identified within the abdomen or pelvis. No findings to explain patient's hematuria and increased urinary frequency. 2. Mild diffuse wall thickening with intramural fatty deposition involving the terminal ileum and entirety of the colon. This is similar when compared with the examination from 04/02/2022. Findings are favored to represent the sequelae of chronic colitis and terminal ileitis. There is also chronic thickening involving the appendix which appears similar on today's study compared with the prior exam. Clinical correlation and is advised. 3. Infrarenal abdominal aortic aneurysm measures 3.3 cm. Recommend follow-up ultrasound every 3  years. This recommendation follows ACR consensus guidelines: White Paper of the ACR Incidental Findings Committee II on Vascular Findings. J Am Coll Radiol 2013; 10:789-794. 4. Status post cholecystectomy with mild fusiform dilatation of the common bile duct. 5.  Aortic Atherosclerosis (ICD10-I70.0). Electronically Signed   By: TKerby MoorsM.D.   On: 05/22/2022 12:32   CT ABDOMEN PELVIS WO CONTRAST  Result Date: 04/03/2022 CLINICAL DATA:  Frequent urination. EXAM: CT ABDOMEN AND PELVIS  WITHOUT CONTRAST TECHNIQUE: Multidetector CT imaging of the abdomen and pelvis was performed following the standard protocol without IV contrast. RADIATION DOSE REDUCTION: This exam was performed according to the departmental dose-optimization program which includes automated exposure control, adjustment of the mA and/or kV according to patient size and/or use of iterative reconstruction technique. COMPARISON:  CT of the chest abdomen pelvis dated 06/21/2018. FINDINGS: Evaluation of this exam is limited in the absence of intravenous contrast. Lower chest: The visualized lung bases are clear. There is coronary vascular calcification. No intra-abdominal free air or free fluid. Hepatobiliary: The liver is unremarkable. No biliary dilatation. Cholecystectomy. No retained calcified stone noted in the central CBD. Pancreas: Unremarkable. No pancreatic ductal dilatation or surrounding inflammatory changes. Spleen: Normal in size without focal abnormality. Adrenals/Urinary Tract: The adrenal glands unremarkable. Probable small vascular calcification versus less likely a punctate nonobstructing right renal upper pole calculus. There is minimal fullness of the right renal collecting system. The left kidney is unremarkable. The visualized ureters and urinary bladder appear unremarkable. Stomach/Bowel: There is diffuse colonic submucosal fat deposit, likely sequela of chronic inflammatory changes. There is no bowel obstruction or active  inflammation. The appendix is not visualized with certainty. No inflammatory changes identified in the right lower quadrant. Vascular/Lymphatic: Advanced aortoiliac atherosclerotic disease. There is a 3.2 cm fusiform infrarenal abdominal aortic aneurysm. No periaortic fluid collection. The IVC is unremarkable. No portal venous gas. There is no adenopathy. Reproductive: The uterus is anteverted and grossly unremarkable. No adnexal masses. Other: None Musculoskeletal: Osteopenia with degenerative changes primarily at L5-S1. No acute osseous pathology. IMPRESSION: 1. Minimal fullness of the right renal collecting system. No obstructing stone. 2. A 3.2 cm fusiform infrarenal abdominal aortic aneurysm. Recommend follow-up ultrasound every 3 years. This recommendation follows ACR consensus guidelines: White Paper of the ACR Incidental Findings Committee II on Vascular Findings. J Am Coll Radiol 2013; 10:789-794. 3. No bowel obstruction. Electronically Signed   By: Anner Crete M.D.   On: 04/03/2022 03:24   CT CHEST LUNG CANCER SCREENING LOW DOSE WO CONTRAST  Result Date: 03/13/2022 CLINICAL DATA:  Current smoker with 50 pack-year history EXAM: CT CHEST WITHOUT CONTRAST LOW-DOSE FOR LUNG CANCER SCREENING TECHNIQUE: Multidetector CT imaging of the chest was performed following the standard protocol without IV contrast. RADIATION DOSE REDUCTION: This exam was performed according to the departmental dose-optimization program which includes automated exposure control, adjustment of the mA and/or kV according to patient size and/or use of iterative reconstruction technique. COMPARISON:  Chest CT dated March 06, 2021 FINDINGS: Cardiovascular: Normal heart size. Trace pericardial effusion. Normal caliber thoracic aorta with severe atherosclerotic disease. Severe left main and three-vessel coronary artery calcifications. Mediastinum/Nodes: Esophagus and thyroid are unremarkable. No pathologically enlarged lymph nodes seen  in the chest. Lungs/Pleura: Central airways are patent. Moderate to severe centrilobular emphysema. No consolidation, pleural effusion or pneumothorax. Stable part solid nodule of the left upper lobe measuring 9.5 mm in overall diameter with 5.6 mm solid component located on image 141. Additional small solid pulmonary nodules, stable when compared with prior exam, reference 3.4 mm pulmonary nodule of the right lower lobe located on image 135. Upper Abdomen: No acute abnormality. Musculoskeletal: No chest wall mass or suspicious bone lesions identified. IMPRESSION: 1. Lung-RADS 2S, benign appearance or behavior. Continue annual screening with low-dose chest CT without contrast in 12 months. S modifier for coronary artery calcifications. 2. Severe left main and three-vessel coronary artery calcifications, recommend ASCVD risk assessment. 3. Aortic Atherosclerosis (ICD10-I70.0) and Emphysema (ICD10-J43.9). Electronically  Signed   By: Yetta Glassman M.D.   On: 03/13/2022 16:21     Assessment/Plan: OAB dry with microhematuria.   The CT is free of GU findings but she has 3-10 RBC's today so I reinforced the need for cystoscopy since she is a smoker and has had prior cytoxan which can increase the risk of bladder cancer and bladder fibrosis.   She would like to reschedule the cystoscopy until she is able to get over some side effects from recent meds so I will have her return in 4-6 weeks.  Frequency and nocturia have improved.  She didn't tolerate Gemtesa.    No orders of the defined types were placed in this encounter.    Orders Placed This Encounter  Procedures   Urinalysis, Routine w reflex microscopic     Return in about 4 weeks (around 07/02/2022) for Please reschedule cystoscopy for 4-6 weeks. .    CC: Dr. Allyn Kenner.      Irine Seal 06/04/2022

## 2022-06-05 DIAGNOSIS — E782 Mixed hyperlipidemia: Secondary | ICD-10-CM | POA: Diagnosis not present

## 2022-06-05 LAB — MICROSCOPIC EXAMINATION: Bacteria, UA: NONE SEEN

## 2022-06-05 LAB — URINALYSIS, ROUTINE W REFLEX MICROSCOPIC
Bilirubin, UA: NEGATIVE
Glucose, UA: NEGATIVE
Ketones, UA: NEGATIVE
Nitrite, UA: NEGATIVE
Protein,UA: NEGATIVE
Specific Gravity, UA: 1.025 (ref 1.005–1.030)
Urobilinogen, Ur: 0.2 mg/dL (ref 0.2–1.0)
pH, UA: 5.5 (ref 5.0–7.5)

## 2022-06-12 DIAGNOSIS — E782 Mixed hyperlipidemia: Secondary | ICD-10-CM | POA: Diagnosis not present

## 2022-06-12 DIAGNOSIS — M5442 Lumbago with sciatica, left side: Secondary | ICD-10-CM | POA: Diagnosis not present

## 2022-06-12 DIAGNOSIS — B351 Tinea unguium: Secondary | ICD-10-CM | POA: Diagnosis not present

## 2022-06-12 DIAGNOSIS — I10 Essential (primary) hypertension: Secondary | ICD-10-CM | POA: Diagnosis not present

## 2022-06-12 DIAGNOSIS — Z23 Encounter for immunization: Secondary | ICD-10-CM | POA: Diagnosis not present

## 2022-06-12 DIAGNOSIS — C8330 Diffuse large B-cell lymphoma, unspecified site: Secondary | ICD-10-CM | POA: Diagnosis not present

## 2022-06-12 DIAGNOSIS — R0609 Other forms of dyspnea: Secondary | ICD-10-CM | POA: Diagnosis not present

## 2022-06-12 DIAGNOSIS — I700 Atherosclerosis of aorta: Secondary | ICD-10-CM | POA: Diagnosis not present

## 2022-06-12 DIAGNOSIS — F17219 Nicotine dependence, cigarettes, with unspecified nicotine-induced disorders: Secondary | ICD-10-CM | POA: Diagnosis not present

## 2022-06-12 DIAGNOSIS — G620 Drug-induced polyneuropathy: Secondary | ICD-10-CM | POA: Diagnosis not present

## 2022-06-15 ENCOUNTER — Encounter: Payer: Self-pay | Admitting: *Deleted

## 2022-06-29 NOTE — Progress Notes (Signed)
HPI: FU CAD. Patient had PCI of her right coronary artery in 2011 in the setting of myocardial infarction.  Records are not available.  Chest CT August 2022 showed emphysema, aortic atherosclerosis and calcium in the left main as well as three-vessel coronary artery disease.  Nuclear study March 2023 showed ejection fraction 45% and no ischemia.  Echocardiogram March 2023 showed normal LV function, grade 1 diastolic dysfunction.  CT 10/23 showed 3.3 cm abdominal aortic aneurysm.  Since last seen, she denies dyspnea, chest pain, palpitations or syncope.  Current Outpatient Medications  Medication Sig Dispense Refill   albuterol (VENTOLIN HFA) 108 (90 Base) MCG/ACT inhaler      ALPRAZolam (XANAX) 0.5 MG tablet Take 0.25-0.5 mg by mouth daily as needed for anxiety.     aspirin 81 MG chewable tablet      buPROPion (WELLBUTRIN XL) 150 MG 24 hr tablet Take 150 mg by mouth daily.     fluticasone (FLONASE) 50 MCG/ACT nasal spray Place into both nostrils daily.     gabapentin (NEURONTIN) 100 MG capsule Take 1 capsule by mouth in the morning and at bedtime.     pantoprazole (PROTONIX) 40 MG tablet Take 40 mg by mouth daily. Pt takes at supper daily     rosuvastatin (CRESTOR) 40 MG tablet Take 1 tablet (40 mg total) by mouth daily. 90 tablet 3   No current facility-administered medications for this visit.     Past Medical History:  Diagnosis Date   Anxiety    Arthritis    CAD (coronary artery disease)    COPD (chronic obstructive pulmonary disease) (HCC)    DLBCL (diffuse large B cell lymphoma) ([place]) 07/16/2016   GERD (gastroesophageal reflux disease)    Hyperlipidemia    Hypertension    Irritable bowel syndrome (IBS)    with diarrhea   Myocardial infarction ([place]) 12/19/2009   released from cardiology    Past Surgical History:  Procedure Laterality Date   CARDIAC CATHETERIZATION     cardiac stent   CHOLECYSTECTOMY     CORONARY STENT PLACEMENT  2011   INGUINAL LYMPH NODE BIOPSY  Left 07/13/2016   Procedure: EXCISIONAL BIOPSY OF LEFT INGUINAL LYMPH NODE;  Surgeon: Vickie Epley, MD;  Location: [facility];  Service: General;  Laterality: Left;   PORT-A-CATH REMOVAL Right 05/20/2018   Procedure: MINOR REMOVAL PORT-A-CATH;  Surgeon: Aviva Signs, MD;  Location: [facility];  Service: General;  Laterality: Right;   PORTACATH PLACEMENT N/A 07/17/2016   Procedure: INSERTION OF TUNNELED CENTRAL VENOUS CATHETER WITH SUBCUTANEOUS PORT;  Surgeon: Vickie Epley, MD;  Location: [facility];  Service: Vascular;  Laterality: N/A;   TUBAL LIGATION      Social History   Socioeconomic History   Marital status: Single    Spouse name: Not on file   Number of children: 2   Years of education: Not on file   Highest education level: Not on file  Occupational History   Not on file  Tobacco Use   Smoking status: Every Day    Packs/day: 0.50    Years: 40.00    Total pack years: 20.00    Types: Cigarettes   Smokeless tobacco: Never  Substance and Sexual Activity   Alcohol use: No   Drug use: No   Sexual activity: Yes    Birth control/protection: Post-menopausal  Other Topics Concern   Not on file  Social History Narrative   Not on file   Social Determinants of Health  Financial Resource Strain: Not on file  Food Insecurity: Not on file  Transportation Needs: Not on file  Physical Activity: Not on file  Stress: Not on file  Social Connections: Not on file  Intimate Partner Violence: Not on file    Family History  Problem Relation Age of Onset   Cancer Mother        colon   Depression Mother    Heart disease Father    Depression Sister    Cancer Sister        leukemia   Hyperlipidemia Brother    Hypertension Brother     ROS: no fevers or chills, productive cough, hemoptysis, dysphasia, odynophagia, melena, hematochezia, dysuria, hematuria, rash, seizure activity, orthopnea, PND, pedal edema, claudication. Remaining systems are negative.  Physical  Exam: Well-developed well-nourished in no acute distress.  Skin is warm and dry.  HEENT is normal.  Neck is supple.  Chest is clear to auscultation with normal expansion.  Cardiovascular exam is regular rate and rhythm.  Abdominal exam nontender or distended. No masses palpated. Extremities show no edema. neuro grossly intact  ECG-normal sinus rhythm at a rate of 90, nonspecific ST changes.  Personally reviewed  A/P  1 coronary artery disease-patient denies chest pain.  Previous nuclear study showed no ischemia.  Continue medical therapy including aspirin and statin.  2 abdominal aortic aneurysm-we will plan follow-up ultrasound October 2025.  3 hyperlipidemia-continue statin.  She has an intolerance to Zetia.  4 tobacco abuse-patient again counseled on discontinuing.  Kirk Ruths, MD

## 2022-07-07 ENCOUNTER — Ambulatory Visit: Payer: Medicare Other | Attending: Cardiology | Admitting: Cardiology

## 2022-07-07 ENCOUNTER — Encounter: Payer: Self-pay | Admitting: Cardiology

## 2022-07-07 VITALS — BP 121/78 | HR 90 | Ht 61.5 in | Wt 118.4 lb

## 2022-07-07 DIAGNOSIS — I714 Abdominal aortic aneurysm, without rupture, unspecified: Secondary | ICD-10-CM | POA: Diagnosis not present

## 2022-07-07 DIAGNOSIS — I2510 Atherosclerotic heart disease of native coronary artery without angina pectoris: Secondary | ICD-10-CM | POA: Diagnosis not present

## 2022-07-07 DIAGNOSIS — I10 Essential (primary) hypertension: Secondary | ICD-10-CM | POA: Diagnosis not present

## 2022-07-07 DIAGNOSIS — E7800 Pure hypercholesterolemia, unspecified: Secondary | ICD-10-CM

## 2022-07-07 NOTE — Patient Instructions (Signed)
    Follow-Up: At [facility], you and your health needs are our priority.  As part of our continuing mission to provide you with exceptional heart care, we have created designated Provider Care Teams.  These Care Teams include your primary Cardiologist (physician) and Advanced Practice Providers (APPs -  Physician Assistants and Nurse Practitioners) who all work together to provide you with the care you need, when you need it.  We recommend signing up for the patient portal called "MyChart".  Sign up information is provided on this After Visit Summary.  MyChart is used to connect with patients for Virtual Visits (Telemedicine).  Patients are able to view lab/test results, encounter notes, upcoming appointments, etc.  Non-urgent messages can be sent to your provider as well.   To learn more about what you can do with MyChart, go to [email or web address].    Your next appointment:   12 month(s)  The format for your next appointment:   In Person  Provider:   Brian Crenshaw MD          

## 2022-07-16 DIAGNOSIS — L821 Other seborrheic keratosis: Secondary | ICD-10-CM | POA: Diagnosis not present

## 2022-07-16 DIAGNOSIS — L814 Other melanin hyperpigmentation: Secondary | ICD-10-CM | POA: Diagnosis not present

## 2022-07-16 DIAGNOSIS — L820 Inflamed seborrheic keratosis: Secondary | ICD-10-CM | POA: Diagnosis not present

## 2022-07-16 DIAGNOSIS — L570 Actinic keratosis: Secondary | ICD-10-CM | POA: Diagnosis not present

## 2022-07-23 ENCOUNTER — Other Ambulatory Visit: Payer: Medicare Other | Admitting: Urology

## 2022-07-30 ENCOUNTER — Other Ambulatory Visit: Payer: Medicare Other | Admitting: Urology

## 2022-08-27 ENCOUNTER — Encounter: Payer: Self-pay | Admitting: Urology

## 2022-08-27 ENCOUNTER — Ambulatory Visit (INDEPENDENT_AMBULATORY_CARE_PROVIDER_SITE_OTHER): Payer: 59 | Admitting: Urology

## 2022-08-27 VITALS — BP 122/81 | HR 111

## 2022-08-27 DIAGNOSIS — N3281 Overactive bladder: Secondary | ICD-10-CM

## 2022-08-27 DIAGNOSIS — R351 Nocturia: Secondary | ICD-10-CM | POA: Diagnosis not present

## 2022-08-27 DIAGNOSIS — R3129 Other microscopic hematuria: Secondary | ICD-10-CM | POA: Diagnosis not present

## 2022-08-27 DIAGNOSIS — R350 Frequency of micturition: Secondary | ICD-10-CM | POA: Diagnosis not present

## 2022-08-27 LAB — MICROSCOPIC EXAMINATION: Bacteria, UA: NONE SEEN

## 2022-08-27 LAB — URINALYSIS, ROUTINE W REFLEX MICROSCOPIC
Bilirubin, UA: NEGATIVE
Glucose, UA: NEGATIVE
Ketones, UA: NEGATIVE
Leukocytes,UA: NEGATIVE
Nitrite, UA: NEGATIVE
Protein,UA: NEGATIVE
Specific Gravity, UA: <1.005 — ABNORMAL LOW (ref 1.005–1.030)
Urobilinogen, Ur: 0.2 mg/dL (ref 0.2–1.0)
pH, UA: 5.5 (ref 5.0–7.5)

## 2022-08-27 MED ORDER — CIPROFLOXACIN HCL 500 MG PO TABS
500.0000 mg | ORAL_TABLET | Freq: Once | ORAL | Status: AC
  Administered 2022-08-27: 500 mg via ORAL

## 2022-08-27 NOTE — Progress Notes (Signed)
Subjective: 1. Urinary frequency   2. Microhematuria   3. Nocturia      Consult requested by Dr. Allyn Kenner   08/27/22: Gabriella Love returns today in f/u.  She tried just drinking water which helped her nocturia but she is back on the [place]. Dew and gets up 4-5x nightly.  Her UA today has tr blood.  I will go ahead with cystoscopy as planned.   04/23/22: Gabriella Love is a 68 yo female who is sent by Dr. [place] Crane for recurrent hematuria and urinary frequency.   She had a CT [facility] w/o contrast on 04/02/22 that showed mild fullness of the right collecting system but no stones or bladder issues.   She had 2+ blood on a recent dip UA after a fall but  a repeat 2 weeks later was clear.  Her Cr was normal on the last labs I see from 2/23.   Her UA today has trace blood.  Shehas had some frequency that has been progressive over the last 2 years, particularly over the last 6 months.  She has a history of non-Hodgkin's lymphoma in 2018 and was treated with chemo.   She voids about 8x daily and 7-8x at night.  It is variable.  She has had no gross hematuria.  Her PVR is 32m.  She has no incontinence but has a sensation of fullness that is relieved by voiding.  She has no pain.   She was given Cytoxan as part of her chemo regimen.  She didn't get [place].   She has had no recent UTI's.   She has had no GU surgery.   She has had no stones.    She is a smoker.   She was treated for OAB about 40 years ago after her children were born.   She is on gabapentin for post chemo neuropathy.   G2P2 NVD.  06/04/22:  Gabriella Love returns today in f/u for consideration of cystoscopy.  She had no GU findings on her CT.  Her last UA was clear and her voiding symptoms have largely improved but she has 3-10 RBC's on the UA today.  She didn't tolerate the [place].  She was given antibiotics for her colitis and that has helped her symptoms.  ROS:  Review of Systems  Respiratory:  Positive for shortness of breath (improving.).   Endo/Heme/Allergies:  Bruises/bleeds  easily.  All other systems reviewed and are negative.   Allergies  Allergen Reactions   Amoxicillin Nausea And Vomiting    Yeast Infections  Has patient had a PCN reaction causing immediate rash, facial/tongue/throat swelling, SOB or lightheadedness with hypotension: No Has patient had a PCN reaction causing severe rash involving mucus membranes or skin necrosis: No Has patient had a PCN reaction that required hospitalization No Has patient had a PCN reaction occurring within the last 10 years: No If all of the above answers are "NO", then may proceed with Cephalosporin use.    Choline Fenofibrate     REACTION: Abd pain and elevated Lipase   Ezetimibe Nausea And Vomiting   Penicillins     Yeast infections, thrush  Has patient had a PCN reaction causing immediate rash, facial/tongue/throat swelling, SOB or lightheadedness with hypotension: No Has patient had a PCN reaction causing severe rash involving mucus membranes or skin necrosis: No Has patient had a PCN reaction that required hospitalization No Has patient had a PCN reaction occurring within the last 10 years: No If all of the above answers are "NO", then may  proceed with Cephalosporin use.    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   GERD (gastroesophageal reflux disease)    Hyperlipidemia    Hypertension    Irritable bowel syndrome (IBS)    with diarrhea   Myocardial infarction ([place]) 12/19/2009   released from cardiology    Past Surgical History:  Procedure Laterality Date   CARDIAC CATHETERIZATION     cardiac stent   CHOLECYSTECTOMY     CORONARY STENT PLACEMENT  2011   INGUINAL LYMPH NODE BIOPSY Left 07/13/2016   Procedure: EXCISIONAL BIOPSY OF LEFT INGUINAL LYMPH NODE;  Surgeon: Vickie Epley, MD;  Location: [facility];  Service: General;  Laterality: Left;   PORT-A-CATH REMOVAL Right 05/20/2018   Procedure: MINOR REMOVAL PORT-A-CATH;  Surgeon: Aviva Signs, MD;  Location: [facility];  Service: General;  Laterality: Right;   PORTACATH PLACEMENT N/A 07/17/2016   Procedure: INSERTION OF TUNNELED CENTRAL VENOUS CATHETER WITH SUBCUTANEOUS PORT;  Surgeon: Vickie Epley, MD;  Location: [facility];  Service: Vascular;  Laterality: N/A;   TUBAL LIGATION      Social History   Socioeconomic History   Marital status: Single    Spouse name: Not on file   Number of children: 2   Years of education: Not on file   Highest education level: Not on file  Occupational History   Not on file  Tobacco Use   Smoking status: Every Day    Packs/day: 0.50    Years: 40.00    Total pack years: 20.00    Types: Cigarettes   Smokeless tobacco: Never  Substance and Sexual Activity   Alcohol use: No   Drug use: No   Sexual activity: Yes    Birth control/protection: Post-menopausal  Other Topics Concern   Not on file  Social History Narrative   Not on file   Social Determinants of Health   Financial Resource Strain: Not on file  Food Insecurity: Not on file  Transportation Needs: Not on file  Physical Activity: Not on file  Stress: Not on file  Social Connections: Not on file  Intimate Partner Violence: Not on file    Family History  Problem Relation Age of Onset   Cancer Mother        colon   Depression Mother    Heart disease Father    Depression Sister    Cancer Sister        leukemia   Hyperlipidemia Brother    Hypertension Brother     Anti-infectives: Anti-infectives (From admission, onward)    Start     Dose/Rate Route Frequency Ordered Stop   08/27/22 0945  CIPROFLOXACIN HCL 500 MG PO TABS        500 mg Oral  Once 08/27/22 0939 08/27/22 1019       Current Outpatient Medications  Medication Sig Dispense Refill   albuterol (VENTOLIN HFA) 108 (90 Base) MCG/ACT  inhaler      ALPRAZolam (XANAX) 0.5 MG tablet Take 0.25-0.5 mg by mouth daily as needed for anxiety.     aspirin 81 MG chewable tablet      buPROPion (WELLBUTRIN XL) 150 MG 24 hr tablet Take 150 mg by mouth daily.     fluticasone (FLONASE) 50 MCG/ACT nasal spray  Place into both nostrils daily.     gabapentin (NEURONTIN) 100 MG capsule Take 1 capsule by mouth in the morning and at bedtime.     pantoprazole (PROTONIX) 40 MG tablet Take 40 mg by mouth daily. Pt takes at supper daily     rosuvastatin (CRESTOR) 40 MG tablet Take 1 tablet (40 mg total) by mouth daily. 90 tablet 3   No current facility-administered medications for this visit.     Objective: Vital signs in last 24 hours: BP 122/81   Pulse (!) 111   Intake/Output from previous day: No intake/output data recorded. Intake/Output this shift: '@IOTHISSHIFT'$ @   Physical Exam Vitals reviewed.  Constitutional:      Appearance: Normal appearance.  Neurological:     Mental Status: She is alert.     Lab Results:  Results for orders placed or performed in visit on 08/27/22 (from the past 24 hour(s))  Urinalysis, Routine w reflex microscopic     Status: Abnormal   Collection Time: 08/27/22  9:44 AM  Result Value Ref Range   Specific Gravity, UA <1.005 (L) 1.005 - 1.030   pH, UA 5.5 5.0 - 7.5   Color, UA Yellow Yellow   Appearance Ur Clear Clear   Leukocytes,UA Negative Negative   Protein,UA Negative Negative/Trace   Glucose, UA Negative Negative   Ketones, UA Negative Negative   RBC, UA Trace (A) Negative   Bilirubin, UA Negative Negative   Urobilinogen, Ur 0.2 0.2 - 1.0 mg/dL   Nitrite, UA Negative Negative   Microscopic Examination See below:    Narrative   Performed at:  [facility] [address], [place], [place]  [id number] Lab Director: Mina Marble [place], Phone:  [phone]  Microscopic Examination     Status: None   Collection Time: 08/27/22  9:44 AM   Urine  Result Value Ref Range   WBC, UA  0-5 0 - 5 /hpf   RBC, Urine 0-2 0 - 2 /hpf   Epithelial Cells (non renal) 0-10 0 - 10 /hpf   Bacteria, UA None seen None seen/Few   Narrative   Performed at:  [facility] [address], [place], [place]  [id number] Lab Director: [place], Phone:  [phone]    UA has 3-10 RBC's.  BMET No results for input(s): "NA", "K", "CL", "CO2", "GLUCOSE", "BUN", "CREATININE", "CALCIUM" in the last 72 hours. PT/INR No results for input(s): "LABPROT", "INR" in the last 72 hours. ABG No results for input(s): "PHART", "HCO3" in the last 72 hours.  Invalid input(s): "PCO2", "PO2"  Studies/Results: No results found. Procedure: Flexible cystoscopy.  She was prepped with betadine and given po Cipro.   The scope was passed.  The urethra is normal.  The bladder wall has mild trabeculation without lesions.  The UO's are normal.     Assessment/Plan: OAB dry with microhematuria.   Cystoscopy is clear.     Frequency and nocturia improved with just water but she is back to the [place] Dew's.  I discussed PTNS or an anticholinergic but she will go back to the water.  F/u in 1 year.    Meds ordered this encounter  Medications   ciprofloxacin (CIPRO) tablet 500 mg      Orders Placed This Encounter  Procedures   Microscopic Examination   Urinalysis, Routine w reflex microscopic     Return in about 1 year (around 08/28/2023).    CC: Dr. Allyn Kenner.      Jenny Reichmann  Jeffie Pollock 08/28/2022

## 2022-09-24 ENCOUNTER — Encounter: Payer: Self-pay | Admitting: Radiology

## 2022-09-28 ENCOUNTER — Inpatient Hospital Stay: Payer: 59 | Attending: Hematology

## 2022-09-28 DIAGNOSIS — E538 Deficiency of other specified B group vitamins: Secondary | ICD-10-CM | POA: Diagnosis not present

## 2022-09-28 DIAGNOSIS — F17210 Nicotine dependence, cigarettes, uncomplicated: Secondary | ICD-10-CM | POA: Insufficient documentation

## 2022-09-28 DIAGNOSIS — I10 Essential (primary) hypertension: Secondary | ICD-10-CM | POA: Insufficient documentation

## 2022-09-28 DIAGNOSIS — C8338 Diffuse large B-cell lymphoma, lymph nodes of multiple sites: Secondary | ICD-10-CM | POA: Diagnosis not present

## 2022-09-28 DIAGNOSIS — C8330 Diffuse large B-cell lymphoma, unspecified site: Secondary | ICD-10-CM

## 2022-09-28 LAB — CBC WITH DIFFERENTIAL/PLATELET
Abs Immature Granulocytes: 0.01 10*3/uL (ref 0.00–0.07)
Basophils Absolute: 0.1 10*3/uL (ref 0.0–0.1)
Basophils Relative: 1 %
Eosinophils Absolute: 0 10*3/uL (ref 0.0–0.5)
Eosinophils Relative: 1 %
HCT: 45.4 % (ref 36.0–46.0)
Hemoglobin: 15.1 g/dL — ABNORMAL HIGH (ref 12.0–15.0)
Immature Granulocytes: 0 %
Lymphocytes Relative: 26 %
Lymphs Abs: 1.7 10*3/uL (ref 0.7–4.0)
MCH: 31.4 pg (ref 26.0–34.0)
MCHC: 33.3 g/dL (ref 30.0–36.0)
MCV: 94.4 fL (ref 80.0–100.0)
Monocytes Absolute: 0.5 10*3/uL (ref 0.1–1.0)
Monocytes Relative: 8 %
Neutro Abs: 4.3 10*3/uL (ref 1.7–7.7)
Neutrophils Relative %: 64 %
Platelets: 222 10*3/uL (ref 150–400)
RBC: 4.81 MIL/uL (ref 3.87–5.11)
RDW: 13.5 % (ref 11.5–15.5)
WBC: 6.6 10*3/uL (ref 4.0–10.5)
nRBC: 0 % (ref 0.0–0.2)

## 2022-09-28 LAB — IRON AND TIBC
Iron: 76 ug/dL (ref 28–170)
Saturation Ratios: 20 % (ref 10.4–31.8)
TIBC: 376 ug/dL (ref 250–450)
UIBC: 300 ug/dL

## 2022-09-28 LAB — COMPREHENSIVE METABOLIC PANEL
ALT: 16 U/L (ref 0–44)
AST: 19 U/L (ref 15–41)
Albumin: 4.2 g/dL (ref 3.5–5.0)
Alkaline Phosphatase: 93 U/L (ref 38–126)
Anion gap: 8 (ref 5–15)
BUN: 13 mg/dL (ref 8–23)
CO2: 28 mmol/L (ref 22–32)
Calcium: 9.6 mg/dL (ref 8.9–10.3)
Chloride: 103 mmol/L (ref 98–111)
Creatinine, Ser: 0.94 mg/dL (ref 0.44–1.00)
GFR, Estimated: >60 mL/min (ref >60)
Glucose, Bld: 126 mg/dL — ABNORMAL HIGH (ref 70–99)
Potassium: 4.8 mmol/L (ref 3.5–5.1)
Sodium: 139 mmol/L (ref 135–145)
Total Bilirubin: 0.5 mg/dL (ref 0.3–1.2)
Total Protein: 7.1 g/dL (ref 6.5–8.1)

## 2022-09-28 LAB — VITAMIN B12: Vitamin B-12: 185 pg/mL (ref 180–914)

## 2022-09-28 LAB — LACTATE DEHYDROGENASE: LDH: 129 U/L (ref 98–192)

## 2022-09-28 LAB — FERRITIN: Ferritin: 41 ng/mL (ref 11–307)

## 2022-10-05 ENCOUNTER — Inpatient Hospital Stay (HOSPITAL_BASED_OUTPATIENT_CLINIC_OR_DEPARTMENT_OTHER): Payer: 59 | Admitting: Hematology

## 2022-10-05 VITALS — BP 135/85 | HR 93 | Temp 98.2°F | Resp 18 | Ht 61.5 in | Wt 123.2 lb

## 2022-10-05 DIAGNOSIS — Z87891 Personal history of nicotine dependence: Secondary | ICD-10-CM | POA: Diagnosis not present

## 2022-10-05 DIAGNOSIS — F17210 Nicotine dependence, cigarettes, uncomplicated: Secondary | ICD-10-CM | POA: Diagnosis not present

## 2022-10-05 DIAGNOSIS — Z122 Encounter for screening for malignant neoplasm of respiratory organs: Secondary | ICD-10-CM

## 2022-10-05 DIAGNOSIS — C8330 Diffuse large B-cell lymphoma, unspecified site: Secondary | ICD-10-CM | POA: Diagnosis not present

## 2022-10-05 DIAGNOSIS — E538 Deficiency of other specified B group vitamins: Secondary | ICD-10-CM | POA: Diagnosis not present

## 2022-10-05 DIAGNOSIS — C8338 Diffuse large B-cell lymphoma, lymph nodes of multiple sites: Secondary | ICD-10-CM | POA: Diagnosis not present

## 2022-10-05 DIAGNOSIS — I10 Essential (primary) hypertension: Secondary | ICD-10-CM | POA: Diagnosis not present

## 2022-10-05 NOTE — Progress Notes (Signed)
[name] [address], [place] [address]    Clinic Day:  10/05/2022  Referring physician: Celene Squibb, MD  Patient Care Team: Celene Squibb, MD as PCP - General (Internal Medicine) [name] Denice Bors, MD as PCP - Cardiology (Cardiology)   ASSESSMENT & PLAN:   Assessment: 1.  Stage IIIb large B-cell lymphoma: -Presentation with weight loss, night sweats, adenopathy in the neck, abdomen and right inguinal area -6 cycles of R-CHOP from 07/30/2016 through 11/12/2016 - Denies fevers, night sweats or weight loss.  Denies any recurrent infections or hospitalizations. -CT CAP on 06/21/2018 did not show any evidence of adenopathy in the chest, abdomen and pelvis.  Innumerable new groundglass nodules in both lungs.      Plan: 1.  Stage IIIb large B-cell lymphoma: - Denies fevers, night sweats or weight loss. - Physical exam: No evidence of lymphadenopathy or splenomegaly. - Reviewed labs from 09/28/2022.  Normal LFTs and LDH.  CBC was normal.  I also reviewed CT scan done for hematuria which did not show splenomegaly or lymphadenopathy. - RTC 1 year for follow-up.   2.  Smoking history: - Reviewed CT chest from 03/12/2022: No evidence of suspicious lung lesions. - Will schedule for another low-dose CT scan in August of this year.   3.  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  Hemoglobin is 15.1, most likely from smoking.   4.  B12 deficiency: - Continue vitamin B12 1 mg tablet daily.  B12 level is 185.  Orders Placed This Encounter  Procedures   CT CHEST LUNG CANCER SCREENING LOW DOSE WO CONTRAST    Standing Status:   Future    Standing Expiration Date:   10/05/2023    Order Specific Question:   Preferred Imaging Location?    Answer:   [facility]    Order Specific Question:   Release to patient    Answer:   Immediate [1]   CBC with Differential/Platelet    Standing Status:   Future    Standing Expiration Date:   10/05/2023     Order Specific Question:   Release to patient    Answer:   Immediate   Comprehensive metabolic panel    Standing Status:   Future    Standing Expiration Date:   10/05/2023    Order Specific Question:   Release to patient    Answer:   Immediate   Lactate dehydrogenase    Standing Status:   Future    Standing Expiration Date:   10/05/2023    Order Specific Question:   Release to patient    Answer:   Immediate   Ferritin    Standing Status:   Future    Standing Expiration Date:   10/05/2023    Order Specific Question:   Release to patient    Answer:   Immediate   Iron and TIBC    Standing Status:   Future    Standing Expiration Date:   10/05/2023    Order Specific Question:   Release to patient    Answer:   Immediate   Vitamin B12    Standing Status:   Future    Standing Expiration Date:   10/05/2023    Order Specific Question:   Release to patient    Answer:   Immediate      I,Alexis Herring,acting as a scribe for Derek Jack, MD.,have documented all relevant documentation on the behalf of Derek Jack,  MD,as directed by  Derek Jack, MD while in the presence of Derek Jack, MD.   I, Derek Jack MD, have reviewed the above documentation for accuracy and completeness, and I agree with the above.   Derek Jack, MD   3/11/20243:55 PM  CHIEF COMPLAINT:   Diagnosis: DLBCL    Cancer Staging  Lymphoma, large-cell, diffuse ([place]) Staging form: Hodgkin and Non-Hodgkin Lymphoma, AJCC 8th Edition - Clinical stage from 07/31/2016: Stage III (Diffuse large B-cell lymphoma) - Signed by Baird Cancer, PA-C on 07/31/2016    Prior Therapy: R-CHOP x 6 cycles from 07/30/2016 to 11/12/2016   Current Therapy:  surveillance    HISTORY OF PRESENT ILLNESS:   Oncology History  Lymphoma, large-cell, diffuse ([place])  07/13/2016 Initial Biopsy   L inguinal biopsy with Dr. 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 Grade (if applicable): High grade. Flow cytometry: [id number] reveals a monoclonal B-cell population with expression of CD10. Immunohistochemical stains: CD20, CD3, CD10, bcl-2, bcl-6, CD21, CD23, Ki-67. Touch preps/imprints: Mixture of large and smaller lymphocytes with irregular nuclear contours. Comments: Sections of lymph node reveal effacement of the architecture by a nodular proliferation of neoplastic appearing follicles. In many of the areas the follicles coalesce and form sheets of large cells. The more formed follicles predominately consist of numerous large centroblasts (>15 per hpf), representing a high grade (3B) follicular component. The diffuse areas are also composed of large cells but lack follicular dendritic networks (CD21, CD23). Immunohistochemistry reveals the atypical lymphocytes are positive for CD20, CD10, and bcl-6. They are negative for bcl-2. Ki-67 is elevated (30% overall). CD3 reveals residual surrounding T-cells. Flow cytometry [id number]) reveals a monoclonal B-cell population with expression of CD10. 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 The case was called to Dr. Rosana Hoes on 07/16/2016.   07/23/2016 Echocardiogram   Left ventricle: Systolic function was normal. The estimated   ejection fraction was in the range of 50% to 55%. Doppler   parameters are consistent with abnormal left ventricular   relaxation (grade 1 diastolic dysfunction).   07/24/2016 Procedure   Technically successful CT guided right iliac bone core and aspiration biopsy by IR.   07/28/2016 PET scan   1. Hypermetabolic lymphadenopathy in the neck, abdomen and right inguinal area as described above, consistent with known lymphoma. 2. Focal  area of hypermetabolism in the appendix could be lymphomatous involvement. Recommend attention on follow-up scans. 3. Age advanced atherosclerotic calcifications involving the abdominal aorta and branch vessels and age advanced three-vessel coronary artery calcifications. 4. No obvious osseous involvement. 5. Emphysematous changes and pulmonary scarring.   07/28/2016 Pathology Results   Bone Marrow, Aspirate,Biopsy, and Clot, right iliac - NORMOCELLULAR BONE MARROW WITH TRILINEAGE HEMATOPOIESIS. PERIPHERAL BLOOD: - OCCASIONAL CIRCULATING ATYPICAL LYMPHOID CELLS.   07/28/2016 Pathology Results   Bone Marrow Flow Cytometry - NO MONOCLONAL B CELL POPULATION OR ABNORMAL T CELL PHENOTYPE IDENTIFIED.   07/30/2016 Pathology Results   Peripheral Blood Flow Cytometry - NO MONOCLONAL B-CELL POPULATION OR ABNORMAL T-CELL PHENOTYPE IDENTIFIED.   07/30/2016 -  Chemotherapy   The patient had DOXOrubicin (ADRIAMYCIN) chemo injection 78 mg, 50 mg/m2 = 78 mg, Intravenous,  Once, 2 of 6 cycles  palonosetron (ALOXI) injection 0.25 mg, 0.25 mg, Intravenous,  Once, 2 of 6 cycles  pegfilgrastim (NEULASTA ONPRO KIT) injection 6 mg, 6 mg, Subcutaneous, Once, 2 of 6 cycles  vinCRIStine (ONCOVIN) 2 mg in sodium chloride 0.9 % 50 mL chemo infusion, 2 mg, Intravenous,  Once, 2 of 6 cycles  riTUXimab (RITUXAN) 600 mg in sodium chloride 0.9 % 250 mL (1.9355 mg/mL) chemo infusion, 375 mg/m2 = 600 mg, Intravenous,  Once, 2 of 6 cycles  cyclophosphamide (CYTOXAN) 1,180 mg in sodium chloride 0.9 % 250 mL chemo infusion, 750 mg/m2 = 1,180 mg, Intravenous,  Once, 2 of 6 cycles  for chemotherapy treatment.     08/03/2016 Pathology Results   Cytogenetic lab results [facility]). Cytogenetic Analysis: Normal.    09/23/2016 PET scan   1. Significantly improved appearance, with resolution of prior hypermetabolic activity in all of the previously involved lymph nodes. All lymph nodes are currently within normal size limits. 2.  Other imaging findings of potential clinical significance: Chronic right sphenoid sinusitis. Right mastoid effusion. Coronary, aortic arch, and branch vessel atherosclerotic vascular disease. Severe emphysema. Aortoiliac atherosclerotic vascular disease.   11/12/2016 -  Chemotherapy   Completed cycle 6 of RCHOP    12/11/2016 PET scan   1. No evidence of residual hypermetabolic lymphoma or adenopathy. 2. Age advanced coronary artery atherosclerosis. Recommend assessment of coronary risk factors and consideration of medical therapy. 3. Centrilobular emphysema.   06/18/2017 PET scan   IMPRESSION: 1. No new or recurrent hypermetabolic adenopathy in the neck, chest, abdomen, or pelvis. 2. There is potentially some mild wall thickening along a 6 cm segment of the distal ileum. Scattered accentuated metabolic activity in the bowel, likely physiologic. There is some fat deposition in the proximal colon wall, which is typically considered incidental although there is a weak association with inflammatory bowel disease. 3. Aortic Atherosclerosis (ICD10-I70.0) and Emphysema (ICD10-J43.9). Coronary atherosclerosis.      INTERVAL HISTORY:   Gabriella Love is a 67 y.o. female presenting to clinic today for follow up of DLBCL. She was last seen by me on 09/29/21 in person and on 03/17/22 virtually .  Today, she states that she is doing well overall. Her appetite level is at 100%. Her energy level is at 75%. She denies any fevers, night sweats or weight loss.  No infections in the last year.  She is being worked up for microscopic hematuria and a CT scan was unrevealing.   PAST MEDICAL HISTORY:   Past Medical History: Past Medical History:  Diagnosis Date   Anxiety    Arthritis    CAD (coronary artery disease)    COPD (chronic obstructive pulmonary disease) (HCC)    DLBCL (diffuse large B cell lymphoma) ([place]) 07/16/2016   GERD (gastroesophageal reflux disease)    Hyperlipidemia    Hypertension     Irritable bowel syndrome (IBS)    with diarrhea   Myocardial infarction ([place]) 12/19/2009   released from cardiology    Surgical History: Past Surgical History:  Procedure Laterality Date   CARDIAC CATHETERIZATION     cardiac stent   CHOLECYSTECTOMY     CORONARY STENT PLACEMENT  2011   INGUINAL LYMPH NODE BIOPSY Left 07/13/2016   Procedure: EXCISIONAL BIOPSY OF LEFT INGUINAL LYMPH NODE;  Surgeon: Vickie Epley, MD;  Location: [facility];  Service: General;  Laterality: Left;   PORT-A-CATH REMOVAL Right 05/20/2018   Procedure: MINOR REMOVAL PORT-A-CATH;  Surgeon: Aviva Signs, MD;  Location: [facility];  Service: General;  Laterality: Right;   PORTACATH PLACEMENT N/A 07/17/2016   Procedure: INSERTION OF TUNNELED CENTRAL VENOUS CATHETER  WITH SUBCUTANEOUS PORT;  Surgeon: Vickie Epley, MD;  Location: [facility];  Service: Vascular;  Laterality: N/A;   TUBAL LIGATION      Social History: Social History   Socioeconomic History   Marital status: Single    Spouse name: Not on file   Number of children: 2   Years of education: Not on file   Highest education level: Not on file  Occupational History   Not on file  Tobacco Use   Smoking status: Every Day    Packs/day: 0.50    Years: 40.00    Total pack years: 20.00    Types: Cigarettes   Smokeless tobacco: Never  Substance and Sexual Activity   Alcohol use: No   Drug use: No   Sexual activity: Yes    Birth control/protection: Post-menopausal  Other Topics Concern   Not on file  Social History Narrative   Not on file   Social Determinants of Health   Financial Resource Strain: Not on file  Food Insecurity: Not on file  Transportation Needs: Not on file  Physical Activity: Not on file  Stress: Not on file  Social Connections: Not on file  Intimate Partner Violence: Not on file    Family History: Family History  Problem Relation Age of Onset   Cancer Mother        colon   Depression Mother    Heart disease Father     Depression Sister    Cancer Sister        leukemia   Hyperlipidemia Brother    Hypertension Brother     Current Medications:  Current Outpatient Medications:    albuterol (VENTOLIN HFA) 108 (90 Base) MCG/ACT inhaler, , Disp: , Rfl:    ALPRAZolam (XANAX) 0.5 MG tablet, Take 0.25-0.5 mg by mouth daily as needed for anxiety., Disp: , Rfl:    aspirin 81 MG chewable tablet, , Disp: , Rfl:    buPROPion (WELLBUTRIN XL) 150 MG 24 hr tablet, Take 150 mg by mouth daily., Disp: , Rfl:    fluticasone (FLONASE) 50 MCG/ACT nasal spray, Place into both nostrils daily., Disp: , Rfl:    gabapentin (NEURONTIN) 100 MG capsule, Take 1 capsule by mouth in the morning and at bedtime., Disp: , Rfl:    pantoprazole (PROTONIX) 40 MG tablet, Take 40 mg by mouth daily. Pt takes at supper daily, Disp: , Rfl:    rosuvastatin (CRESTOR) 40 MG tablet, Take 1 tablet (40 mg total) by mouth daily., Disp: 90 tablet, Rfl: 3   Allergies: Allergies  Allergen Reactions   Amoxicillin Nausea And Vomiting    Yeast Infections  Has patient had a PCN reaction causing immediate rash, facial/tongue/throat swelling, SOB or lightheadedness with hypotension: No Has patient had a PCN reaction causing severe rash involving mucus membranes or skin necrosis: No Has patient had a PCN reaction that required hospitalization No Has patient had a PCN reaction occurring within the last 10 years: No If all of the above answers are "NO", then may proceed with Cephalosporin use.    Choline Fenofibrate     REACTION: Abd pain and elevated Lipase   Ezetimibe Nausea And Vomiting   Penicillins     Yeast infections, thrush  Has patient had a PCN reaction causing immediate rash, facial/tongue/throat swelling, SOB or lightheadedness with hypotension: No Has patient had a PCN reaction causing severe rash involving mucus membranes or skin necrosis: No Has patient had a PCN reaction that required hospitalization No Has patient had  a PCN reaction  occurring within the last 10 years: No If all of the above answers are "NO", then may proceed with Cephalosporin use.    Percocet [Oxycodone-Acetaminophen] Nausea And Vomiting    REVIEW OF SYSTEMS:   Review of Systems  Constitutional:  Negative for chills, fatigue and fever.  HENT:   Negative for lump/mass, mouth sores, nosebleeds, sore throat and trouble swallowing.   Eyes:  Negative for eye problems.  Respiratory:  Positive for cough and shortness of breath.   Cardiovascular:  Negative for chest pain, leg swelling and palpitations.  Gastrointestinal:  Positive for diarrhea. Negative for abdominal pain, constipation, nausea and vomiting.  Genitourinary:  Negative for bladder incontinence, difficulty urinating, dysuria, frequency, hematuria and nocturia.   Musculoskeletal:  Negative for arthralgias, back pain, flank pain, myalgias and neck pain.  Skin:  Negative for itching and rash.  Neurological:  Negative for dizziness, headaches and numbness.  Hematological:  Does not bruise/bleed easily.  Psychiatric/Behavioral:  Negative for depression, sleep disturbance and suicidal ideas. The patient is not nervous/anxious.   All other systems reviewed and are negative.    VITALS:   Blood pressure 135/85, pulse 93, temperature 98.2 F (36.8 C), temperature source Oral, resp. rate 18, height 5' 1.5" (1.562 m), weight 123 lb 3.2 oz (55.9 kg), SpO2 97 %.  Wt Readings from Last 3 Encounters:  10/05/22 123 lb 3.2 oz (55.9 kg)  07/07/22 118 lb 6.4 oz (53.7 kg)  03/17/22 123 lb (55.8 kg)    Body mass index is 22.9 kg/m.  Performance status (ECOG): 1 - Symptomatic but completely ambulatory  PHYSICAL EXAM:   Physical Exam Vitals and nursing note reviewed. Exam conducted with a chaperone present.  Constitutional:      Appearance: Normal appearance.  Cardiovascular:     Rate and Rhythm: Normal rate and regular rhythm.     Pulses: Normal pulses.     Heart sounds: Normal heart sounds.   Pulmonary:     Effort: Pulmonary effort is normal.     Breath sounds: Normal breath sounds.  Abdominal:     Palpations: Abdomen is soft. There is no hepatomegaly, splenomegaly or mass.     Tenderness: There is no abdominal tenderness.  Musculoskeletal:     Right lower leg: No edema.     Left lower leg: No edema.  Lymphadenopathy:     Cervical: No cervical adenopathy.     Right cervical: No superficial, deep or posterior cervical adenopathy.    Left cervical: No superficial, deep or posterior cervical adenopathy.     Upper Body:     Right upper body: No supraclavicular or axillary adenopathy.     Left upper body: No supraclavicular or axillary adenopathy.  Neurological:     General: No focal deficit present.     Mental Status: She is alert and oriented to person, place, and time.  Psychiatric:        Mood and Affect: Mood normal.        Behavior: Behavior normal.     LABS:      Latest Ref Rng & Units 09/28/2022   12:41 PM 09/11/2021   11:47 AM 03/06/2021   10:07 AM  CBC  WBC 4.0 - 10.5 K/uL 6.6  5.6  5.8   Hemoglobin 12.0 - 15.0 g/dL 15.1  15.5  16.0   Hematocrit 36.0 - 46.0 % 45.4  46.3  48.5   Platelets 150 - 400 K/uL 222  267  222  Latest Ref Rng & Units 09/28/2022   12:41 PM 09/11/2021   11:47 AM 03/06/2021   10:07 AM  CMP  Glucose 70 - 99 mg/dL 126  117  98   BUN 8 - 23 mg/dL '13  11  16   '$ Creatinine 0.44 - 1.00 mg/dL 0.94  0.74  0.77   Sodium 135 - 145 mmol/L 139  139  138   Potassium 3.5 - 5.1 mmol/L 4.8  3.8  4.4   Chloride 98 - 111 mmol/L 103  101  103   CO2 22 - 32 mmol/L '28  26  29   '$ Calcium 8.9 - 10.3 mg/dL 9.6  9.5  9.7   Total Protein 6.5 - 8.1 g/dL 7.1  6.7  7.2   Total Bilirubin 0.3 - 1.2 mg/dL 0.5  0.5  0.7   Alkaline Phos 38 - 126 U/L 93  95  84   AST 15 - 41 U/L '19  16  16   '$ ALT 0 - 44 U/L '16  15  13      '$ No results found for: "CEA1", "CEA" / No results found for: "CEA1", "CEA" No results found for: "PSA1" No results found for:  "[id number]" No results found for: "CAN125"  No results found for: "TOTALPROTELP", "ALBUMINELP", "A1GS", "A2GS", "BETS", "BETA2SER", "GAMS", "MSPIKE", "SPEI" Lab Results  Component Value Date   TIBC 376 09/28/2022   TIBC 358 03/13/2021   FERRITIN 41 09/28/2022   FERRITIN 44 03/13/2021   IRONPCTSAT 20 09/28/2022   IRONPCTSAT 16 03/13/2021   Lab Results  Component Value Date   LDH 129 09/28/2022   LDH 119 09/11/2021   LDH 144 03/06/2021     STUDIES:   No results found.

## 2022-10-05 NOTE — Patient Instructions (Signed)
[facility]  Discharge Instructions  You were seen and examined today by Dr. Delton Coombes.  Dr. Delton Coombes discussed your most recent lab work which revealed that everything looks good.  Get your low dose CT of your chest.  Follow-up as scheduled in 1 year with labs.    Thank you for choosing [facility] to provide your oncology and hematology care.   To afford each patient quality time with our provider, please arrive at least 15 minutes before your scheduled appointment time. You may need to reschedule your appointment if you arrive late (10 or more minutes). Arriving late affects you and other patients whose appointments are after yours.  Also, if you miss three or more appointments without notifying the office, you may be dismissed from the clinic at the provider's discretion.    Again, thank you for choosing [facility].  Our hope is that these requests will decrease the amount of time that you wait before being seen by our physicians.   If you have a lab appointment with the [facility] please come in thru the Main Entrance and check in at the main information desk.           _____________________________________________________________  Should you have questions after your visit to [facility], please contact our office at [phone] and follow the prompts.  Our office hours are 8:00 a.m. to 4:30 p.m. Monday - Thursday and 8:00 a.m. to 2:30 p.m. Friday.  Please note that voicemails left after 4:00 p.m. may not be returned until the following business day.  We are closed weekends and all major holidays.  You do have access to a nurse 24-7, just call the main number to the clinic [phone] and do not press any options, hold on the line and a nurse will answer the phone.    For prescription refill requests, have your pharmacy contact our office and allow 72 hours.    Masks are optional in the  cancer centers. If you would like for your care team to wear a mask while they are taking care of you, please let them know. You may have one support person who is at least 66 years old accompany you for your appointments.

## 2022-10-07 DIAGNOSIS — E782 Mixed hyperlipidemia: Secondary | ICD-10-CM | POA: Diagnosis not present

## 2022-10-07 DIAGNOSIS — R7301 Impaired fasting glucose: Secondary | ICD-10-CM | POA: Diagnosis not present

## 2022-10-12 DIAGNOSIS — I700 Atherosclerosis of aorta: Secondary | ICD-10-CM | POA: Diagnosis not present

## 2022-10-12 DIAGNOSIS — F17210 Nicotine dependence, cigarettes, uncomplicated: Secondary | ICD-10-CM | POA: Diagnosis not present

## 2022-10-12 DIAGNOSIS — R945 Abnormal results of liver function studies: Secondary | ICD-10-CM | POA: Diagnosis not present

## 2022-10-12 DIAGNOSIS — R069 Unspecified abnormalities of breathing: Secondary | ICD-10-CM | POA: Diagnosis not present

## 2022-10-12 DIAGNOSIS — G620 Drug-induced polyneuropathy: Secondary | ICD-10-CM | POA: Diagnosis not present

## 2022-10-12 DIAGNOSIS — M65311 Trigger thumb, right thumb: Secondary | ICD-10-CM | POA: Diagnosis not present

## 2022-10-12 DIAGNOSIS — I10 Essential (primary) hypertension: Secondary | ICD-10-CM | POA: Diagnosis not present

## 2022-10-12 DIAGNOSIS — M5442 Lumbago with sciatica, left side: Secondary | ICD-10-CM | POA: Diagnosis not present

## 2022-10-12 DIAGNOSIS — Z23 Encounter for immunization: Secondary | ICD-10-CM | POA: Diagnosis not present

## 2022-10-12 DIAGNOSIS — E782 Mixed hyperlipidemia: Secondary | ICD-10-CM | POA: Diagnosis not present

## 2022-10-16 DIAGNOSIS — Z1211 Encounter for screening for malignant neoplasm of colon: Secondary | ICD-10-CM | POA: Diagnosis not present

## 2022-10-21 LAB — COLOGUARD: COLOGUARD: NEGATIVE

## 2022-10-29 ENCOUNTER — Other Ambulatory Visit: Payer: Self-pay | Admitting: Cardiovascular Disease

## 2022-10-29 DIAGNOSIS — E7800 Pure hypercholesterolemia, unspecified: Secondary | ICD-10-CM

## 2022-11-07 ENCOUNTER — Encounter: Payer: Self-pay | Admitting: Emergency Medicine

## 2022-11-07 ENCOUNTER — Ambulatory Visit
Admission: EM | Admit: 2022-11-07 | Discharge: 2022-11-07 | Disposition: A | Discharge status: Home / Self Care | Payer: 59 | Attending: Nurse Practitioner | Admitting: Nurse Practitioner

## 2022-11-07 DIAGNOSIS — R399 Unspecified symptoms and signs involving the genitourinary system: Secondary | ICD-10-CM | POA: Diagnosis present

## 2022-11-07 LAB — POCT URINALYSIS DIP (MANUAL ENTRY)
Bilirubin, UA: NEGATIVE
Glucose, UA: NEGATIVE mg/dL
Ketones, POC UA: NEGATIVE mg/dL
Nitrite, UA: NEGATIVE
Protein Ur, POC: NEGATIVE mg/dL
Spec Grav, UA: <=1.005 — AB (ref 1.010–1.025)
Urobilinogen, UA: 0.2 E.U./dL
pH, UA: 5 (ref 5.0–8.0)

## 2022-11-07 MED ORDER — SULFAMETHOXAZOLE-TRIMETHOPRIM 800-160 MG PO TABS: 1.0000 | ORAL_TABLET | Freq: Two times a day (BID) | ORAL | 0 refills | Status: AC

## 2022-11-07 MED ORDER — FLUCONAZOLE 150 MG PO TABS: 150.0000 mg | ORAL_TABLET | Freq: Once | ORAL | 0 refills | Status: AC

## 2022-11-07 NOTE — ED Provider Notes (Signed)
[facility]    CSN: [id number] Arrival date & time: 11/07/22  0802      History   Chief Complaint No chief complaint on file.   HPI Gabriella Love is a 67 y.o. female.   The history is provided by the patient.   The patient presents for complaints of urinary symptoms that have been present for the past 3 days.  Patient complains of pain with urination, right-sided low back/flank pain, and urinary frequency.  Patient denies fever, chills, abdominal pain, nausea, vomiting, or diarrhea.  Patient states that she has seen urologist before because there was blood seen on her previous urinalysis.  She states that "Dr. Annabell Howells scoped her" and could not find anything.  She states that she had gone back to the doctor to repeat her urinalysis, but there was no blood.  Patient states that she has not had a urinary tract infection in more than 10 to 15 years.  She denies history of kidney stones.  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   GERD (gastroesophageal reflux disease)    Hyperlipidemia    Hypertension    Irritable bowel syndrome (IBS)    with diarrhea   Myocardial infarction 12/19/2009   released from cardiology    Patient Active Problem List   Diagnosis Date Noted   Lymphoma, large-cell, diffuse 07/16/2016   Cervicitis 08/30/2013   Vaginitis and vulvovaginitis, unspecified 08/15/2013   Nausea 11/21/2011   Abdominal pain 03/26/2011   Acid reflux 02/26/2011   Abdominal pain, epigastric 02/26/2011   BRONCHITIS, CHRONIC 05/14/2010   NEVUS 04/08/2010   EUSTACHIAN TUBE DYSFUNCTION 02/13/2010   Essential hypertension 01/16/2010   Coronary atherosclerosis 01/16/2010   ECCHYMOSES, SPONTANEOUS 01/16/2010   HYPERLIPIDEMIA 12/30/2007   ANXIETY DEPRESSION 12/30/2007   TOBACCO ABUSE 12/02/2007   MITRAL VALVE PROLAPSE  12/02/2007   ALLERGIC RHINITIS 12/02/2007   IBS 12/02/2007    Past Surgical History:  Procedure Laterality Date   CARDIAC CATHETERIZATION     cardiac stent   CHOLECYSTECTOMY     CORONARY STENT PLACEMENT  2011   INGUINAL LYMPH NODE BIOPSY Left 07/13/2016   Procedure: EXCISIONAL BIOPSY OF LEFT INGUINAL LYMPH NODE;  Surgeon: Ancil Linsey, MD;  Location: [facility];  Service: General;  Laterality: Left;   PORT-A-CATH REMOVAL Right 05/20/2018   Procedure: MINOR REMOVAL PORT-A-CATH;  Surgeon: Franky Macho, MD;  Location: [facility];  Service: General;  Laterality: Right;   PORTACATH PLACEMENT N/A 07/17/2016   Procedure: INSERTION OF TUNNELED CENTRAL VENOUS CATHETER WITH SUBCUTANEOUS PORT;  Surgeon: Ancil Linsey, MD;  Location: [facility];  Service: Vascular;  Laterality: N/A;   TUBAL LIGATION      OB History   No obstetric history on file.      Home Medications    Prior to Admission medications   Medication Sig Start Date End Date Taking? Authorizing Provider  fluconazole (DIFLUCAN) 150 MG tablet Take 1 tablet (150 mg total) by mouth once for 1 dose. 11/07/22 11/07/22 Yes Mahitha Hickling-Warren, Sadie Haber, NP  sulfamethoxazole-trimethoprim (BACTRIM DS) 800-160 MG tablet Take 1 tablet by mouth 2 (two) times daily for 5 days. 11/07/22 11/12/22 Yes Thea Holshouser-Warren, Sadie Haber, NP  albuterol (VENTOLIN HFA) 108 (90 Base) MCG/ACT inhaler  07/14/19   [provider]  ALPRAZolam Prudy Feeler) 0.5 MG tablet Take 0.25-0.5  mg by mouth daily as needed for anxiety. 05/08/16   [provider]  aspirin 81 MG chewable tablet  12/25/13   [provider]  buPROPion (WELLBUTRIN XL) 150 MG 24 hr tablet Take 150 mg by mouth daily.    [provider]  fluticasone (FLONASE) 50 MCG/ACT nasal spray Place into both nostrils daily.    [provider]  gabapentin (NEURONTIN) 100 MG capsule Take 1 capsule by mouth in the morning and at bedtime. 07/07/21   [provider]  pantoprazole  (PROTONIX) 40 MG tablet Take 40 mg by mouth daily. Pt takes at supper daily    [provider]  rosuvastatin (CRESTOR) 40 MG tablet TAKE 1 TABLET(40 MG) BY MOUTH DAILY 10/29/22   O'Neal, Ronnald Ramp, MD    Family History Family History  Problem Relation Age of Onset   Cancer Mother        colon   Depression Mother    Heart disease Father    Depression Sister    Cancer Sister        leukemia   Hyperlipidemia Brother    Hypertension Brother     Social History Social History   Tobacco Use   Smoking status: Every Day    Packs/day: 0.50    Years: 40.00    Additional pack years: 0.00    Total pack years: 20.00    Types: Cigarettes   Smokeless tobacco: Never  Substance Use Topics   Alcohol use: No   Drug use: No     Allergies   Amoxicillin, Choline fenofibrate, Ezetimibe, Penicillins, and Percocet [oxycodone-acetaminophen]   Review of Systems Review of Systems Per HPI  Physical Exam Triage Vital Signs ED Triage Vitals  Enc Vitals Group     BP 11/07/22 0807 (!) 143/81     Pulse Rate 11/07/22 0807 88     Resp 11/07/22 0807 18     Temp 11/07/22 0807 97.6 F (36.4 C)     Temp Source 11/07/22 0807 Oral     SpO2 11/07/22 0807 92 %     Weight --      Height --      Head Circumference --      Peak Flow --      Pain Score 11/07/22 0810 3     Pain Loc --      Pain Edu? --      Excl. in GC? --    No data found.  Updated Vital Signs BP (!) 143/81 (BP Location: Right Arm)   Pulse 88   Temp 97.6 F (36.4 C) (Oral)   Resp 18   SpO2 92%   Visual Acuity Right Eye Distance:   Left Eye Distance:   Bilateral Distance:    Right Eye Near:   Left Eye Near:    Bilateral Near:     Physical Exam Vitals and nursing note reviewed.  Constitutional:      General: She is not in acute distress.    Appearance: Normal appearance.  HENT:     Head: Normocephalic.  Eyes:     Extraocular Movements: Extraocular movements intact.     Pupils: Pupils are equal,  round, and reactive to light.  Cardiovascular:     Rate and Rhythm: Normal rate and regular rhythm.     Pulses: Normal pulses.     Heart sounds: Normal heart sounds.  Pulmonary:     Effort: Pulmonary effort is normal. No respiratory distress.     Breath sounds:  Normal breath sounds. No stridor. No wheezing, rhonchi or rales.  Abdominal:     General: Bowel sounds are normal.     Palpations: Abdomen is soft.     Tenderness: There is right CVA tenderness. There is no left CVA tenderness.  Musculoskeletal:     Cervical back: Normal range of motion.  Lymphadenopathy:     Cervical: No cervical adenopathy.  Skin:    General: Skin is warm and dry.  Neurological:     General: No focal deficit present.     Mental Status: She is alert and oriented to person, place, and time.  Psychiatric:        Mood and Affect: Mood normal.        Behavior: Behavior normal.      UC Treatments / Results  Labs (all labs ordered are listed, but only abnormal results are displayed) Labs Reviewed  POCT URINALYSIS DIP (MANUAL ENTRY) - Abnormal; Notable for the following components:      Result Value   Spec Grav, UA <=1.005 (*)    Blood, UA small (*)    Leukocytes, UA Trace (*)    All other components within normal limits  URINE CULTURE    EKG   Radiology No results found.  Procedures Procedures (including critical care time)  Medications Ordered in UC Medications - No data to display  Initial Impression / Assessment and Plan / UC Course  I have reviewed the triage vital signs and the nursing notes.  Pertinent labs & imaging results that were available during my care of the patient were reviewed by me and considered in my medical decision making (see chart for details).  The patient is well-appearing, she is in no acute distress, vital signs are stable, although she is mildly hypertensive.  Urinalysis is positive for trace leukocytes and blood.  Patient's symptoms consistent with a UTI.   Patient also has right flank pain and right CVA tenderness.  Given the patient's symptoms, will treat with Bactrim DS 800/160 mg for the next 5 days.  Urine culture is pending.  Patient was advised that if her urine culture result is negative, she will need to stop the antibiotic and follow-up with Dr. Annabell Howells for further evaluation.  Patient was given supportive care recommendations to include increasing her fluid intake, recommending at least 8-10 8 ounce glasses of water daily, over-the-counter analgesics such as Tylenol, and voiding every 2 hours.  Patient was given strict ER follow-up precautions.  Patient is in agreement with this plan of care and verbalizes understanding.  All questions were answered.  Patient stable for discharge.   Final Clinical Impressions(s) / UC Diagnoses   Final diagnoses:  UTI symptoms     Discharge Instructions      The urinalysis does not indicate an obvious urinary tract infection.  However, based on your symptoms, I am going to treat to with an antibiotic while your urine culture result is pending.  If the culture result is negative, and you are continuing to experience symptoms, please follow-up with Dr. Annabell Howells as discussed. Increase your water intake.  Recommend drinking at least 8-10 8 ounce glasses of water daily while symptoms persist. Avoid caffeine such as tea, soda, and coffee as this can irritate the lining of your bladder. Develop a toileting schedule where you are urinating every 2 hours. If you develop worsening symptoms with new fever, chills, worsening back/flank pain, please go to the emergency department immediately for further evaluation. As discussed, if your urine  culture is negative, and you are continuing to experience symptoms, please follow-up with Dr. Annabell Howells as soon as possible for further evaluation. Follow-up as needed.     ED Prescriptions     Medication Sig Dispense Auth. Provider   sulfamethoxazole-trimethoprim (BACTRIM DS)  800-160 MG tablet Take 1 tablet by mouth 2 (two) times daily for 5 days. 10 tablet Anysia Choi-Warren, Sadie Haber, NP   fluconazole (DIFLUCAN) 150 MG tablet Take 1 tablet (150 mg total) by mouth once for 1 dose. 1 tablet Davidjames Blansett-Warren, Sadie Haber, NP      PDMP not reviewed this encounter.   Abran Cantor, NP 11/07/22 [phone]

## 2022-11-07 NOTE — Discharge Instructions (Addendum)
The urinalysis does not indicate an obvious urinary tract infection.  However, based on your symptoms, I am going to treat to with an antibiotic while your urine culture result is pending.  If the culture result is negative, and you are continuing to experience symptoms, please follow-up with Gabriella Love as discussed. Increase your water intake.  Recommend drinking at least 8-10 8 ounce glasses of water daily while symptoms persist. Avoid caffeine such as tea, soda, and coffee as this can irritate the lining of your bladder. Develop a toileting schedule where you are urinating every 2 hours. If you develop worsening symptoms with new fever, chills, worsening back/flank pain, please go to the emergency department immediately for further evaluation. As discussed, if your urine culture is negative, and you are continuing to experience symptoms, please follow-up with Gabriella Love as soon as possible for further evaluation. Follow-up as needed.

## 2022-11-07 NOTE — ED Triage Notes (Signed)
Lower back pain since yesterday.  Pain more on right side of back.  Urinary frequency and burning since last night.

## 2022-11-09 LAB — URINE CULTURE

## 2022-11-13 ENCOUNTER — Ambulatory Visit
Admission: EM | Admit: 2022-11-13 | Discharge: 2022-11-13 | Disposition: A | Discharge status: Home / Self Care | Payer: 59 | Attending: Physician Assistant | Admitting: Physician Assistant

## 2022-11-13 ENCOUNTER — Encounter: Payer: Self-pay | Admitting: Emergency Medicine

## 2022-11-13 DIAGNOSIS — Z87440 Personal history of urinary (tract) infections: Secondary | ICD-10-CM

## 2022-11-13 DIAGNOSIS — R300 Dysuria: Secondary | ICD-10-CM | POA: Diagnosis not present

## 2022-11-13 LAB — POCT URINALYSIS DIP (MANUAL ENTRY)
Bilirubin, UA: NEGATIVE
Glucose, UA: NEGATIVE mg/dL
Ketones, POC UA: NEGATIVE mg/dL
Leukocytes, UA: NEGATIVE
Nitrite, UA: NEGATIVE
Protein Ur, POC: NEGATIVE mg/dL
Spec Grav, UA: <=1.005 — AB (ref 1.010–1.025)
Urobilinogen, UA: 0.2 E.U./dL
pH, UA: 5.5 (ref 5.0–8.0)

## 2022-11-13 NOTE — ED Triage Notes (Signed)
Was seen on 4/13 for UTI, states her culture said suggest recollection.  States she still has some burning on urination.  Finished antibiotics on Wednesday.

## 2022-11-13 NOTE — ED Provider Notes (Signed)
[facility]    CSN: [id number] Arrival date & time: 11/13/22  8295      History   Chief Complaint No chief complaint on file.   HPI Gabriella Love is a 67 y.o. female.   Patient presents today for recheck after recent UTI.  She was seen by our clinic on 11/07/2022 at which point her UA showed leukocytes and hematuria.  She was started on Bactrim DS for 5 days with significant improvement of symptoms including decreasing but not resolution of dysuria but improvement of lower back and CVA tenderness discomfort.  Denies any fever, nausea, vomiting, vaginal discharge, pelvic pain.  She denies history of diabetes or immunosuppression.  Urine culture was obtained at her last visit which grew multiple species and recommended recollection.  Patient is requesting that we send off a another urine culture today as she continues to have some mild dysuria though her symptoms have dramatically improved.  She denies history of nephrolithiasis, single kidney, self-catheterization.  She has been seen by urology in the past and had cystoscopy for workup of hematuria without concerning findings.    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   GERD (gastroesophageal reflux disease)    Hyperlipidemia    Hypertension    Irritable bowel syndrome (IBS)    with diarrhea   Myocardial infarction 12/19/2009   released from cardiology    Patient Active Problem List   Diagnosis Date Noted   Lymphoma, large-cell, diffuse 07/16/2016   Cervicitis 08/30/2013   Vaginitis and vulvovaginitis, unspecified 08/15/2013   Nausea 11/21/2011   Abdominal pain 03/26/2011   Acid reflux 02/26/2011   Abdominal pain, epigastric 02/26/2011   BRONCHITIS, CHRONIC 05/14/2010   NEVUS 04/08/2010   EUSTACHIAN TUBE DYSFUNCTION 02/13/2010   Essential hypertension  01/16/2010   Coronary atherosclerosis 01/16/2010   ECCHYMOSES, SPONTANEOUS 01/16/2010   HYPERLIPIDEMIA 12/30/2007   ANXIETY DEPRESSION 12/30/2007   TOBACCO ABUSE 12/02/2007   MITRAL VALVE PROLAPSE 12/02/2007   ALLERGIC RHINITIS 12/02/2007   IBS 12/02/2007    Past Surgical History:  Procedure Laterality Date   CARDIAC CATHETERIZATION     cardiac stent   CHOLECYSTECTOMY     CORONARY STENT PLACEMENT  2011   INGUINAL LYMPH NODE BIOPSY Left 07/13/2016   Procedure: EXCISIONAL BIOPSY OF LEFT INGUINAL LYMPH NODE;  Surgeon: Ancil Linsey, MD;  Location: [facility];  Service: General;  Laterality: Left;   PORT-A-CATH REMOVAL Right 05/20/2018   Procedure: MINOR REMOVAL PORT-A-CATH;  Surgeon: Franky Macho, MD;  Location: [facility];  Service: General;  Laterality: Right;   PORTACATH PLACEMENT N/A 07/17/2016   Procedure: INSERTION OF TUNNELED CENTRAL VENOUS CATHETER WITH SUBCUTANEOUS PORT;  Surgeon: Ancil Linsey, MD;  Location: [facility];  Service: Vascular;  Laterality: N/A;   TUBAL LIGATION      OB History   No obstetric history on file.      Home Medications    Prior to Admission medications   Medication Sig Start Date End Date Taking? Authorizing Provider  albuterol (VENTOLIN HFA) 108 (90 Base) MCG/ACT inhaler  07/14/19   [provider]  ALPRAZolam Prudy Feeler) 0.5 MG tablet Take 0.25-0.5 mg by mouth daily as needed for anxiety. 05/08/16   [provider]  aspirin 81 MG chewable tablet  12/25/13   [provider]  buPROPion [facility]  XL) 150 MG 24 hr tablet Take 150 mg by mouth daily.    [provider]  fluticasone (FLONASE) 50 MCG/ACT nasal spray Place into both nostrils daily.    [provider]  gabapentin (NEURONTIN) 100 MG capsule Take 1 capsule by mouth in the morning and at bedtime. 07/07/21   [provider]  pantoprazole (PROTONIX) 40 MG tablet Take 40 mg by mouth daily. Pt takes at supper daily    [provider]   rosuvastatin (CRESTOR) 40 MG tablet TAKE 1 TABLET(40 MG) BY MOUTH DAILY 10/29/22   O'Neal, Ronnald Ramp, MD    Family History Family History  Problem Relation Age of Onset   Cancer Mother        colon   Depression Mother    Heart disease Father    Depression Sister    Cancer Sister        leukemia   Hyperlipidemia Brother    Hypertension Brother     Social History Social History   Tobacco Use   Smoking status: Every Day    Packs/day: 0.50    Years: 40.00    Additional pack years: 0.00    Total pack years: 20.00    Types: Cigarettes   Smokeless tobacco: Never  Substance Use Topics   Alcohol use: No   Drug use: No     Allergies   Amoxicillin, Choline fenofibrate, Ezetimibe, Penicillins, and Percocet [oxycodone-acetaminophen]   Review of Systems Review of Systems  Constitutional:  Negative for activity change, appetite change, fatigue and fever.  Gastrointestinal:  Negative for abdominal pain, diarrhea, nausea and vomiting.  Genitourinary:  Positive for dysuria. Negative for frequency, urgency, vaginal bleeding, vaginal discharge and vaginal pain.  Musculoskeletal:  Negative for arthralgias, back pain and myalgias.     Physical Exam Triage Vital Signs ED Triage Vitals  Enc Vitals Group     BP 11/13/22 0821 127/68     Pulse Rate 11/13/22 0821 93     Resp 11/13/22 0821 18     Temp 11/13/22 0821 97.6 F (36.4 C)     Temp Source 11/13/22 0821 Oral     SpO2 11/13/22 0826 93 %     Weight --      Height --      Head Circumference --      Peak Flow --      Pain Score 11/13/22 0825 0     Pain Loc --      Pain Edu? --      Excl. in GC? --    No data found.  Updated Vital Signs BP 127/68 (BP Location: Right Arm)   Pulse 93   Temp 97.6 F (36.4 C) (Oral)   Resp 18   SpO2 93%   Visual Acuity Right Eye Distance:   Left Eye Distance:   Bilateral Distance:    Right Eye Near:   Left Eye Near:    Bilateral Near:     Physical Exam Vitals reviewed.   Constitutional:      General: She is awake. She is not in acute distress.    Appearance: Normal appearance. She is well-developed. She is not ill-appearing.     Comments: Very pleasant female appears stated age in no acute distress sitting comfortably in exam room  HENT:     Head: Normocephalic and atraumatic.  Cardiovascular:     Rate and Rhythm: Normal rate and regular rhythm.     Heart sounds: Normal heart sounds, S1 normal and  S2 normal. No murmur heard. Pulmonary:     Effort: Pulmonary effort is normal.     Breath sounds: Normal breath sounds. No wheezing, rhonchi or rales.     Comments: Clear to auscultation bilaterally Abdominal:     General: Bowel sounds are normal.     Palpations: Abdomen is soft.     Tenderness: There is no abdominal tenderness. There is no right CVA tenderness, left CVA tenderness, guarding or rebound.     Comments: Benign abdominal exam.  No CVA tenderness.  Musculoskeletal:     Cervical back: No tenderness or bony tenderness.     Thoracic back: No tenderness or bony tenderness.     Lumbar back: No tenderness or bony tenderness.  Psychiatric:        Behavior: Behavior is cooperative.      UC Treatments / Results  Labs (all labs ordered are listed, but only abnormal results are displayed) Labs Reviewed  POCT URINALYSIS DIP (MANUAL ENTRY) - Abnormal; Notable for the following components:      Result Value   Color, UA light yellow (*)    Spec Grav, UA <=1.005 (*)    Blood, UA trace-intact (*)    All other components within normal limits  URINE CULTURE    EKG   Radiology No results found.  Procedures Procedures (including critical care time)  Medications Ordered in UC Medications - No data to display  Initial Impression / Assessment and Plan / UC Course  I have reviewed the triage vital signs and the nursing notes.  Pertinent labs & imaging results that were available during my care of the patient were reviewed by me and considered in  my medical decision making (see chart for details).     Patient is well-appearing, afebrile, nontoxic, nontachycardic.  Vital signs and physical exam are reassuring today with no indication for emergent evaluation or imaging.  Repeat UA showed persistent hematuria similar to baseline without nitrites or leukocyte esterase.  Given her improvement of symptoms will defer additional antibiotics.  Encouraged her to continue with pushing fluids particularly water and avoiding bladder irritants that she may have interstitial cystitis that is contributing to her symptoms.  We will send her urine for culture and we discussed that if we need to arrange antibiotic treatment we will contact her.  Discussed that if she has any worsening symptoms including abdominal pain, pelvic pain, fever, nausea, vomiting she needs to be seen immediately.  Strict return precautions given.  Final Clinical Impressions(s) / UC Diagnoses   Final diagnoses:  Dysuria  History of recurrent UTIs     Discharge Instructions      I am glad that you are feeling better.  Please continue to make sure you are drinking plenty of fluids particularly water as this will help with your symptoms.  We will send your urine for culture and contact you if we need to arrange any additional antibiotics.  I recommend avoiding bladder irritants including caffeine.  If your symptoms or not improving or if anything worsens you should be seen immediately including fever, abdominal pain, blood in your urine, worsening burning, nausea/vomiting.     ED Prescriptions   None    PDMP not reviewed this encounter.   Jeani Hawking, PA-C 11/13/22 1191

## 2022-11-13 NOTE — Discharge Instructions (Signed)
I am glad that you are feeling better.  Please continue to make sure you are drinking plenty of fluids particularly water as this will help with your symptoms.  We will send your urine for culture and contact you if we need to arrange any additional antibiotics.  I recommend avoiding bladder irritants including caffeine.  If your symptoms or not improving or if anything worsens you should be seen immediately including fever, abdominal pain, blood in your urine, worsening burning, nausea/vomiting.

## 2022-11-15 LAB — URINE CULTURE: Culture: NO GROWTH

## 2023-01-08 DIAGNOSIS — J40 Bronchitis, not specified as acute or chronic: Secondary | ICD-10-CM | POA: Diagnosis not present

## 2023-03-16 ENCOUNTER — Ambulatory Visit (HOSPITAL_COMMUNITY): Admission: RE | Admit: 2023-03-16 | Payer: 59 | Source: Ambulatory Visit

## 2023-04-06 DIAGNOSIS — R7303 Prediabetes: Secondary | ICD-10-CM | POA: Diagnosis not present

## 2023-04-06 DIAGNOSIS — E782 Mixed hyperlipidemia: Secondary | ICD-10-CM | POA: Diagnosis not present

## 2023-04-10 LAB — LAB REPORT - SCANNED

## 2023-04-10 LAB — HEMOGLOBIN A1C: A1c: 5.8

## 2023-04-10 LAB — COMPREHENSIVE METABOLIC PANEL WITH GFR: EGFR: 85

## 2023-04-14 ENCOUNTER — Encounter: Payer: Self-pay | Admitting: Internal Medicine

## 2023-04-14 DIAGNOSIS — I10 Essential (primary) hypertension: Secondary | ICD-10-CM | POA: Diagnosis not present

## 2023-04-14 DIAGNOSIS — J439 Emphysema, unspecified: Secondary | ICD-10-CM | POA: Diagnosis not present

## 2023-04-14 DIAGNOSIS — C8330 Diffuse large B-cell lymphoma, unspecified site: Secondary | ICD-10-CM | POA: Diagnosis not present

## 2023-04-14 DIAGNOSIS — F17219 Nicotine dependence, cigarettes, with unspecified nicotine-induced disorders: Secondary | ICD-10-CM | POA: Diagnosis not present

## 2023-04-14 DIAGNOSIS — K7689 Other specified diseases of liver: Secondary | ICD-10-CM | POA: Diagnosis not present

## 2023-04-14 DIAGNOSIS — I700 Atherosclerosis of aorta: Secondary | ICD-10-CM | POA: Diagnosis not present

## 2023-04-14 DIAGNOSIS — M6530 Trigger finger, unspecified finger: Secondary | ICD-10-CM | POA: Diagnosis not present

## 2023-04-14 DIAGNOSIS — E782 Mixed hyperlipidemia: Secondary | ICD-10-CM | POA: Diagnosis not present

## 2023-04-14 DIAGNOSIS — G620 Drug-induced polyneuropathy: Secondary | ICD-10-CM | POA: Diagnosis not present

## 2023-04-14 DIAGNOSIS — M5442 Lumbago with sciatica, left side: Secondary | ICD-10-CM | POA: Diagnosis not present

## 2023-06-07 DIAGNOSIS — Z634 Disappearance and death of family member: Secondary | ICD-10-CM | POA: Diagnosis not present

## 2023-06-07 DIAGNOSIS — R059 Cough, unspecified: Secondary | ICD-10-CM | POA: Diagnosis not present

## 2023-06-07 DIAGNOSIS — F17210 Nicotine dependence, cigarettes, uncomplicated: Secondary | ICD-10-CM | POA: Diagnosis not present

## 2023-06-07 DIAGNOSIS — Z20822 Contact with and (suspected) exposure to covid-19: Secondary | ICD-10-CM | POA: Diagnosis not present

## 2023-06-07 DIAGNOSIS — J449 Chronic obstructive pulmonary disease, unspecified: Secondary | ICD-10-CM | POA: Diagnosis not present

## 2023-06-07 DIAGNOSIS — F17200 Nicotine dependence, unspecified, uncomplicated: Secondary | ICD-10-CM | POA: Diagnosis not present

## 2023-06-07 DIAGNOSIS — J069 Acute upper respiratory infection, unspecified: Secondary | ICD-10-CM | POA: Diagnosis not present

## 2023-08-19 ENCOUNTER — Ambulatory Visit: Payer: 59 | Admitting: Urology

## 2023-09-13 ENCOUNTER — Ambulatory Visit (HOSPITAL_COMMUNITY): Admission: RE | Admit: 2023-09-13 | Payer: 59 | Source: Ambulatory Visit

## 2023-09-13 ENCOUNTER — Encounter (HOSPITAL_COMMUNITY): Payer: Self-pay

## 2023-10-01 ENCOUNTER — Inpatient Hospital Stay: Payer: 59 | Attending: Hematology

## 2023-10-01 DIAGNOSIS — Z8572 Personal history of non-Hodgkin lymphomas: Secondary | ICD-10-CM | POA: Diagnosis not present

## 2023-10-01 DIAGNOSIS — C8330 Diffuse large B-cell lymphoma, unspecified site: Secondary | ICD-10-CM

## 2023-10-01 DIAGNOSIS — I10 Essential (primary) hypertension: Secondary | ICD-10-CM | POA: Insufficient documentation

## 2023-10-01 DIAGNOSIS — F17210 Nicotine dependence, cigarettes, uncomplicated: Secondary | ICD-10-CM | POA: Insufficient documentation

## 2023-10-01 DIAGNOSIS — Z806 Family history of leukemia: Secondary | ICD-10-CM | POA: Insufficient documentation

## 2023-10-01 DIAGNOSIS — D751 Secondary polycythemia: Secondary | ICD-10-CM | POA: Diagnosis not present

## 2023-10-01 DIAGNOSIS — Z800 Family history of malignant neoplasm of digestive organs: Secondary | ICD-10-CM | POA: Diagnosis not present

## 2023-10-01 DIAGNOSIS — E538 Deficiency of other specified B group vitamins: Secondary | ICD-10-CM | POA: Insufficient documentation

## 2023-10-01 DIAGNOSIS — Z9049 Acquired absence of other specified parts of digestive tract: Secondary | ICD-10-CM | POA: Diagnosis not present

## 2023-10-01 DIAGNOSIS — Z9221 Personal history of antineoplastic chemotherapy: Secondary | ICD-10-CM | POA: Insufficient documentation

## 2023-10-01 LAB — COMPREHENSIVE METABOLIC PANEL
ALT: 17 U/L (ref 0–44)
AST: 17 U/L (ref 15–41)
Albumin: 4.5 g/dL (ref 3.5–5.0)
Alkaline Phosphatase: 88 U/L (ref 38–126)
Anion gap: 11 (ref 5–15)
BUN: 15 mg/dL (ref 8–23)
CO2: 26 mmol/L (ref 22–32)
Calcium: 9.9 mg/dL (ref 8.9–10.3)
Chloride: 100 mmol/L (ref 98–111)
Creatinine, Ser: 0.91 mg/dL (ref 0.44–1.00)
GFR, Estimated: >60 mL/min (ref >60)
Glucose, Bld: 98 mg/dL (ref 70–99)
Potassium: 4.4 mmol/L (ref 3.5–5.1)
Sodium: 137 mmol/L (ref 135–145)
Total Bilirubin: 0.6 mg/dL (ref 0.0–1.2)
Total Protein: 7.3 g/dL (ref 6.5–8.1)

## 2023-10-01 LAB — CBC WITH DIFFERENTIAL/PLATELET
Abs Immature Granulocytes: 0.02 10*3/uL (ref 0.00–0.07)
Basophils Absolute: 0.1 10*3/uL (ref 0.0–0.1)
Basophils Relative: 1 %
Eosinophils Absolute: 0 10*3/uL (ref 0.0–0.5)
Eosinophils Relative: 0 %
HCT: 48.8 % — ABNORMAL HIGH (ref 36.0–46.0)
Hemoglobin: 16.2 g/dL — ABNORMAL HIGH (ref 12.0–15.0)
Immature Granulocytes: 0 %
Lymphocytes Relative: 26 %
Lymphs Abs: 1.6 10*3/uL (ref 0.7–4.0)
MCH: 31.6 pg (ref 26.0–34.0)
MCHC: 33.2 g/dL (ref 30.0–36.0)
MCV: 95.3 fL (ref 80.0–100.0)
Monocytes Absolute: 0.5 10*3/uL (ref 0.1–1.0)
Monocytes Relative: 8 %
Neutro Abs: 4.1 10*3/uL (ref 1.7–7.7)
Neutrophils Relative %: 65 %
Platelets: 211 10*3/uL (ref 150–400)
RBC: 5.12 MIL/uL — ABNORMAL HIGH (ref 3.87–5.11)
RDW: 13.2 % (ref 11.5–15.5)
WBC: 6.3 10*3/uL (ref 4.0–10.5)
nRBC: 0 % (ref 0.0–0.2)

## 2023-10-01 LAB — FERRITIN: Ferritin: 54 ng/mL (ref 11–307)

## 2023-10-01 LAB — IRON AND TIBC
Iron: 90 ug/dL (ref 28–170)
Saturation Ratios: 24 % (ref 10.4–31.8)
TIBC: 378 ug/dL (ref 250–450)
UIBC: 288 ug/dL

## 2023-10-01 LAB — LACTATE DEHYDROGENASE: LDH: 133 U/L (ref 98–192)

## 2023-10-01 LAB — VITAMIN B12: Vitamin B-12: 428 pg/mL (ref 180–914)

## 2023-10-06 DIAGNOSIS — R7303 Prediabetes: Secondary | ICD-10-CM | POA: Diagnosis not present

## 2023-10-06 DIAGNOSIS — E782 Mixed hyperlipidemia: Secondary | ICD-10-CM | POA: Diagnosis not present

## 2023-10-07 ENCOUNTER — Inpatient Hospital Stay (HOSPITAL_BASED_OUTPATIENT_CLINIC_OR_DEPARTMENT_OTHER): Payer: 59 | Admitting: Hematology

## 2023-10-07 ENCOUNTER — Other Ambulatory Visit: Payer: Self-pay | Admitting: Hematology

## 2023-10-07 VITALS — BP 121/71 | HR 84 | Temp 98.1°F | Resp 16 | Wt 111.3 lb

## 2023-10-07 DIAGNOSIS — F17200 Nicotine dependence, unspecified, uncomplicated: Secondary | ICD-10-CM

## 2023-10-07 DIAGNOSIS — I10 Essential (primary) hypertension: Secondary | ICD-10-CM | POA: Diagnosis not present

## 2023-10-07 DIAGNOSIS — C8330 Diffuse large B-cell lymphoma, unspecified site: Secondary | ICD-10-CM

## 2023-10-07 DIAGNOSIS — Z806 Family history of leukemia: Secondary | ICD-10-CM | POA: Diagnosis not present

## 2023-10-07 DIAGNOSIS — Z9221 Personal history of antineoplastic chemotherapy: Secondary | ICD-10-CM | POA: Diagnosis not present

## 2023-10-07 DIAGNOSIS — D751 Secondary polycythemia: Secondary | ICD-10-CM | POA: Diagnosis not present

## 2023-10-07 DIAGNOSIS — Z87891 Personal history of nicotine dependence: Secondary | ICD-10-CM

## 2023-10-07 DIAGNOSIS — Z122 Encounter for screening for malignant neoplasm of respiratory organs: Secondary | ICD-10-CM

## 2023-10-07 DIAGNOSIS — Z800 Family history of malignant neoplasm of digestive organs: Secondary | ICD-10-CM | POA: Diagnosis not present

## 2023-10-07 DIAGNOSIS — Z8572 Personal history of non-Hodgkin lymphomas: Secondary | ICD-10-CM | POA: Diagnosis not present

## 2023-10-07 DIAGNOSIS — E538 Deficiency of other specified B group vitamins: Secondary | ICD-10-CM | POA: Diagnosis not present

## 2023-10-07 DIAGNOSIS — Z9049 Acquired absence of other specified parts of digestive tract: Secondary | ICD-10-CM | POA: Diagnosis not present

## 2023-10-07 DIAGNOSIS — F17210 Nicotine dependence, cigarettes, uncomplicated: Secondary | ICD-10-CM | POA: Diagnosis not present

## 2023-10-07 NOTE — Patient Instructions (Addendum)
 [facility] Discharge Instructions   You were seen and examined today by Dr. Ellin Saba.  He reviewed the results of your lab work which are normal/stable.   We will see you back 1 year. We will repeat lab work prior to this visit.   Return as scheduled.    Thank you for choosing [facility] at [facility] to provide your oncology and hematology care.  To afford each patient quality time with our provider, please arrive at least 15 minutes before your scheduled appointment time.   If you have a lab appointment with the Cancer Center please come in thru the Main Entrance and check in at the main information desk.  You need to re-schedule your appointment should you arrive 10 or more minutes late.  We strive to give you quality time with our providers, and arriving late affects you and other patients whose appointments are after yours.  Also, if you no show three or more times for appointments you may be dismissed from the clinic at the providers discretion.     Again, thank you for choosing [facility].  Our hope is that these requests will decrease the amount of time that you wait before being seen by our physicians.       _____________________________________________________________  Should you have questions after your visit to [facility], please contact our office at [phone] and follow the prompts.  Our office hours are 8:00 a.m. and 4:30 p.m. Monday - Friday.  Please note that voicemails left after 4:00 p.m. may not be returned until the following business day.  We are closed weekends and major holidays.  You do have access to a nurse 24-7, just call the main number to the clinic [phone] and do not press any options, hold on the line and a nurse will answer the phone.    For prescription refill requests, have your pharmacy contact our office and allow 72 hours.    Due to Covid, you will need  to wear a mask upon entering the hospital. If you do not have a mask, a mask will be given to you at the Main Entrance upon arrival. For doctor visits, patients may have 1 support person age 5 or older with them. For treatment visits, patients can not have anyone with them due to social distancing guidelines and our immunocompromised population.

## 2023-10-07 NOTE — Progress Notes (Signed)
 [facility] [address] [place] [address]    Clinic Day:  10/07/2023  Referring physician: Benita Stabile, MD  Patient Care Team: Benita Stabile, MD as PCP - General (Internal Medicine) Jens Som Madolyn Frieze, MD as PCP - Cardiology (Cardiology)   ASSESSMENT & PLAN:   Assessment: 1.  Stage IIIb large B-cell lymphoma: -Presentation with weight loss, night sweats, adenopathy in the neck, abdomen and right inguinal area -6 cycles of R-CHOP from 07/30/2016 through 11/12/2016 - Denies fevers, night sweats or weight loss.  Denies any recurrent infections or hospitalizations. -CT CAP on 06/21/2018 did not show any evidence of adenopathy in the chest, abdomen and pelvis.  Innumerable new groundglass nodules in both lungs.    Plan: 1.  Stage IIIb large B-cell lymphoma: - Denies any fevers, night sweats or weight loss. - Physical exam: No evidence of lymphadenopathy or splenomegaly. - Labs from 10/01/2022: Normal LFTs and LDH.  CBC shows erythrocytosis which is stable.  No cytopenias. - No evidence of recurrence.  RTC 1 year for follow-up.   2.  Smoking history: - She is smoking about 1-1.5 packs of cigarettes per day.  She missed her lung cancer CT screening scan as she was sick.  We will reschedule it.   3.  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  Hemoglobin is 16.2 and RBC count is 5.2.  Most likely from smoking.   4.  B12 deficiency: - B12 level is 428.  Continue B12 1 mg tablet daily.  No orders of the defined types were placed in this encounter.     Gabriella Love,acting as a Neurosurgeon for Doreatha Massed, MD.,have documented all relevant documentation on the behalf of Doreatha Massed, MD,as directed by  Doreatha Massed, MD while in the presence of Doreatha Massed, MD.  I, Doreatha Massed MD, have reviewed the above documentation for accuracy and completeness, and I agree with the above.    Doreatha Massed, MD    3/13/20253:39 PM  CHIEF COMPLAINT:   Diagnosis: DLBCL    Cancer Staging  Lymphoma, large-cell, diffuse (HCC) Staging form: Hodgkin and Non-Hodgkin Lymphoma, AJCC 8th Edition - Clinical stage from 07/31/2016: Stage III (Diffuse large B-cell lymphoma) - Signed by Ellouise Newer, PA-C on 07/31/2016    Prior Therapy: R-CHOP x 6 cycles from 07/30/2016 to 11/12/2016   Current Therapy:  surveillance    HISTORY OF PRESENT ILLNESS:   Oncology History  Lymphoma, large-cell, diffuse (HCC)  07/13/2016 Initial Biopsy   L inguinal biopsy with Dr. 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 Grade (if applicable): High grade. Flow cytometry: [id number] reveals a monoclonal B-cell population with expression of CD10. Immunohistochemical stains: CD20, CD3, CD10, bcl-2, bcl-6, CD21, CD23, Ki-67. Touch preps/imprints: Mixture of large and smaller lymphocytes with irregular nuclear contours. Comments: Sections of lymph node reveal effacement of the architecture by a nodular proliferation of neoplastic appearing follicles. In many of the areas the follicles coalesce and form sheets of large cells. The more formed follicles predominately consist of numerous large centroblasts (>15 per hpf), representing a high grade (3B) follicular component. The diffuse areas are also composed of large cells but lack follicular dendritic networks (CD21, CD23). Immunohistochemistry reveals the atypical lymphocytes are positive for CD20, CD10, and bcl-6. They are negative  for bcl-2. Ki-67 is elevated (30% overall). CD3 reveals residual surrounding T-cells. Flow cytometry ([id number]) reveals a monoclonal B-cell population with expression of CD10. Overall, these findings are consistent with a diffuse large  B-cell lymphoma arising from a follicular lymphoma. The case was called to Dr. Earlene Plater on 07/16/2016.   07/23/2016 Echocardiogram   Left ventricle: Systolic function was normal. The estimated   ejection fraction was in the range of 50% to 55%. Doppler   parameters are consistent with abnormal left ventricular   relaxation (grade 1 diastolic dysfunction).   07/24/2016 Procedure   Technically successful CT guided right iliac bone core and aspiration biopsy by IR.   07/28/2016 PET scan   1. Hypermetabolic lymphadenopathy in the neck, abdomen and right inguinal area as described above, consistent with known lymphoma. 2. Focal area of hypermetabolism in the appendix could be lymphomatous involvement. Recommend attention on follow-up scans. 3. Age advanced atherosclerotic calcifications involving the abdominal aorta and branch vessels and age advanced three-vessel coronary artery calcifications. 4. No obvious osseous involvement. 5. Emphysematous changes and pulmonary scarring.   07/28/2016 Pathology Results   Bone Marrow, Aspirate,Biopsy, and Clot, right iliac - NORMOCELLULAR BONE MARROW WITH TRILINEAGE HEMATOPOIESIS. PERIPHERAL BLOOD: - OCCASIONAL CIRCULATING ATYPICAL LYMPHOID CELLS.   07/28/2016 Pathology Results   Bone Marrow Flow Cytometry - NO MONOCLONAL B CELL POPULATION OR ABNORMAL T CELL PHENOTYPE IDENTIFIED.   07/30/2016 Pathology Results   Peripheral Blood Flow Cytometry - NO MONOCLONAL B-CELL POPULATION OR ABNORMAL T-CELL PHENOTYPE IDENTIFIED.   07/30/2016 -  Chemotherapy   The patient had DOXOrubicin (ADRIAMYCIN) chemo injection 78 mg, 50 mg/m2 = 78 mg, Intravenous,  Once, 2 of 6 cycles  palonosetron (ALOXI) injection 0.25 mg, 0.25 mg, Intravenous,  Once, 2 of 6 cycles  pegfilgrastim (NEULASTA ONPRO KIT) injection 6 mg, 6 mg, Subcutaneous, Once, 2 of 6 cycles  vinCRIStine (ONCOVIN) 2 mg in sodium chloride 0.9 % 50 mL chemo infusion, 2 mg, Intravenous,  Once, 2 of 6  cycles  riTUXimab (RITUXAN) 600 mg in sodium chloride 0.9 % 250 mL (1.9355 mg/mL) chemo infusion, 375 mg/m2 = 600 mg, Intravenous,  Once, 2 of 6 cycles  cyclophosphamide (CYTOXAN) 1,180 mg in sodium chloride 0.9 % 250 mL chemo infusion, 750 mg/m2 = 1,180 mg, Intravenous,  Once, 2 of 6 cycles  for chemotherapy treatment.     08/03/2016 Pathology Results   Cytogenetic lab results [facility]). Cytogenetic Analysis: Normal.    09/23/2016 PET scan   1. Significantly improved appearance, with resolution of prior hypermetabolic activity in all of the previously involved lymph nodes. All lymph nodes are currently within normal size limits. 2. Other imaging findings of potential clinical significance: Chronic right sphenoid sinusitis. Right mastoid effusion. Coronary, aortic arch, and branch vessel atherosclerotic vascular disease. Severe emphysema. Aortoiliac atherosclerotic vascular disease.   11/12/2016 -  Chemotherapy   Completed cycle 6 of RCHOP    12/11/2016 PET scan   1. No evidence of residual hypermetabolic lymphoma or adenopathy. 2. Age advanced coronary artery atherosclerosis. Recommend assessment of coronary risk factors and consideration of medical therapy. 3. Centrilobular emphysema.   06/18/2017 PET scan   IMPRESSION: 1. No new or recurrent hypermetabolic adenopathy in the neck, chest, abdomen, or pelvis. 2. There is potentially some mild wall thickening along a 6 cm segment of the distal ileum. Scattered accentuated metabolic activity in the bowel, likely physiologic. There is some fat deposition in the proximal colon wall, which is typically considered incidental although there is a weak association with  inflammatory bowel disease. 3. Aortic Atherosclerosis (ICD10-I70.0) and Emphysema (ICD10-J43.9). Coronary atherosclerosis.      INTERVAL HISTORY:   Gabriella Love is a 68 y.o. female presenting to clinic today for follow up of DLBCL. She was last seen by me on  10/05/22.  Today, she states that she is doing well overall. Her appetite level is at 100%. Her energy level is at 75%. Gabriella Love denies any fevers, night sweats, or unintentional weight loss. She takes vitamin B12 supplements. Gabriella Love has tobacco use of 1 to 1.5 ppd. Her tobacco use has increased due to the loss of her significant other in November 2024.   PAST MEDICAL HISTORY:   Past Medical History: 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   GERD (gastroesophageal reflux disease)    Hyperlipidemia    Hypertension    Irritable bowel syndrome (IBS)    with diarrhea   Myocardial infarction (HCC) 12/19/2009   released from cardiology    Surgical History: Past Surgical History:  Procedure Laterality Date   CARDIAC CATHETERIZATION     cardiac stent   CHOLECYSTECTOMY     CORONARY STENT PLACEMENT  2011   INGUINAL LYMPH NODE BIOPSY Left 07/13/2016   Procedure: EXCISIONAL BIOPSY OF LEFT INGUINAL LYMPH NODE;  Surgeon: Ancil Linsey, MD;  Location: [facility];  Service: General;  Laterality: Left;   PORT-A-CATH REMOVAL Right 05/20/2018   Procedure: MINOR REMOVAL PORT-A-CATH;  Surgeon: Franky Macho, MD;  Location: [facility];  Service: General;  Laterality: Right;   PORTACATH PLACEMENT N/A 07/17/2016   Procedure: INSERTION OF TUNNELED CENTRAL VENOUS CATHETER WITH SUBCUTANEOUS PORT;  Surgeon: Ancil Linsey, MD;  Location: [facility];  Service: Vascular;  Laterality: N/A;   TUBAL LIGATION      Social History: Social History   Socioeconomic History   Marital status: Single    Spouse name: Not on file   Number of children: 2   Years of education: Not on file   Highest education level: Not on file  Occupational History   Not on file  Tobacco Use   Smoking status: Every Day    Current packs/day: 0.50    Average packs/day: 0.5  packs/day for 40.0 years (20.0 ttl pk-yrs)    Types: Cigarettes   Smokeless tobacco: Never  Substance and Sexual Activity   Alcohol use: No   Drug use: No   Sexual activity: Yes    Birth control/protection: Post-menopausal  Other Topics Concern   Not on file  Social History Narrative   Not on file   Social Drivers of Health   Financial Resource Strain: Not on file  Food Insecurity: Not on file  Transportation Needs: Not on file  Physical Activity: Not on file  Stress: Not on file  Social Connections: Not on file  Intimate Partner Violence: Not on file    Family History: Family History  Problem Relation Age of Onset   Cancer Mother        colon   Depression Mother    Heart disease Father    Depression Sister    Cancer Sister        leukemia   Hyperlipidemia Brother    Hypertension Brother     Current Medications:  Current Outpatient Medications:    albuterol (VENTOLIN HFA) 108 (90 Base) MCG/ACT inhaler, ,  Disp: , Rfl:    ALPRAZolam (XANAX) 0.5 MG tablet, Take 0.25-0.5 mg by mouth daily as needed for anxiety., Disp: , Rfl:    aspirin 81 MG chewable tablet, , Disp: , Rfl:    buPROPion (WELLBUTRIN XL) 150 MG 24 hr tablet, Take 150 mg by mouth daily., Disp: , Rfl:    fluticasone (FLONASE) 50 MCG/ACT nasal spray, Place into both nostrils daily., Disp: , Rfl:    gabapentin (NEURONTIN) 100 MG capsule, Take 1 capsule by mouth in the morning and at bedtime., Disp: , Rfl:    pantoprazole (PROTONIX) 40 MG tablet, Take 40 mg by mouth daily. Pt takes at supper daily, Disp: , Rfl:    rosuvastatin (CRESTOR) 40 MG tablet, TAKE 1 TABLET(40 MG) BY MOUTH DAILY, Disp: 90 tablet, Rfl: 3   Allergies: Allergies  Allergen Reactions   Amoxicillin Nausea And Vomiting    Yeast Infections  Has patient had a PCN reaction causing immediate rash, facial/tongue/throat swelling, SOB or lightheadedness with hypotension: No Has patient had a PCN reaction causing severe rash involving mucus  membranes or skin necrosis: No Has patient had a PCN reaction that required hospitalization No Has patient had a PCN reaction occurring within the last 10 years: No If all of the above answers are "NO", then may proceed with Cephalosporin use.    Choline Fenofibrate     REACTION: Abd pain and elevated Lipase   Ezetimibe Nausea And Vomiting   Penicillins     Yeast infections, thrush  Has patient had a PCN reaction causing immediate rash, facial/tongue/throat swelling, SOB or lightheadedness with hypotension: No Has patient had a PCN reaction causing severe rash involving mucus membranes or skin necrosis: No Has patient had a PCN reaction that required hospitalization No Has patient had a PCN reaction occurring within the last 10 years: No If all of the above answers are "NO", then may proceed with Cephalosporin use.    Percocet [Oxycodone-Acetaminophen] Nausea And Vomiting    REVIEW OF SYSTEMS:   Review of Systems  Constitutional:  Negative for chills, fatigue and fever.  HENT:   Negative for lump/mass, mouth sores, nosebleeds, sore throat and trouble swallowing.   Eyes:  Negative for eye problems.  Respiratory:  Positive for cough and shortness of breath.   Cardiovascular:  Negative for chest pain, leg swelling and palpitations.  Gastrointestinal:  Negative for abdominal pain, constipation, diarrhea, nausea and vomiting.  Genitourinary:  Negative for bladder incontinence, difficulty urinating, dysuria, frequency, hematuria and nocturia.   Musculoskeletal:  Negative for arthralgias, back pain, flank pain, myalgias and neck pain.  Skin:  Negative for itching and rash.  Neurological:  Negative for dizziness, headaches and numbness.  Hematological:  Does not bruise/bleed easily.  Psychiatric/Behavioral:  Positive for sleep disturbance. Negative for depression and suicidal ideas. The patient is not nervous/anxious.   All other systems reviewed and are negative.    VITALS:   Blood  pressure 121/71, pulse 84, temperature 98.1 F (36.7 C), temperature source Oral, resp. rate 16, weight 111 lb 5.3 oz (50.5 kg), SpO2 96%.  Wt Readings from Last 3 Encounters:  10/07/23 111 lb 5.3 oz (50.5 kg)  10/05/22 123 lb 3.2 oz (55.9 kg)  07/07/22 118 lb 6.4 oz (53.7 kg)    Body mass index is 20.7 kg/m.  Performance status (ECOG): 1 - Symptomatic but completely ambulatory  PHYSICAL EXAM:   Physical Exam Vitals and nursing note reviewed. Exam conducted with a chaperone present.  Constitutional:  Appearance: Normal appearance.  Cardiovascular:     Rate and Rhythm: Normal rate and regular rhythm.     Pulses: Normal pulses.     Heart sounds: Normal heart sounds.  Pulmonary:     Effort: Pulmonary effort is normal.     Breath sounds: Normal breath sounds.  Abdominal:     Palpations: Abdomen is soft. There is no hepatomegaly, splenomegaly or mass.     Tenderness: There is no abdominal tenderness.  Musculoskeletal:     Right lower leg: No edema.     Left lower leg: No edema.  Lymphadenopathy:     Cervical: No cervical adenopathy.     Right cervical: No superficial, deep or posterior cervical adenopathy.    Left cervical: No superficial, deep or posterior cervical adenopathy.     Upper Body:     Right upper body: No supraclavicular or axillary adenopathy.     Left upper body: No supraclavicular or axillary adenopathy.  Neurological:     General: No focal deficit present.     Mental Status: She is alert and oriented to person, place, and time.  Psychiatric:        Mood and Affect: Mood normal.        Behavior: Behavior normal.     LABS:      Latest Ref Rng & Units 10/01/2023   11:51 AM 09/28/2022   12:41 PM 09/11/2021   11:47 AM  CBC  WBC 4.0 - 10.5 K/uL 6.3  6.6  5.6   Hemoglobin 12.0 - 15.0 g/dL 96.0  45.4  09.8   Hematocrit 36.0 - 46.0 % 48.8  45.4  46.3   Platelets 150 - 400 K/uL 211  222  267       Latest Ref Rng & Units 10/01/2023   11:51 AM 09/28/2022    12:41 PM 09/11/2021   11:47 AM  CMP  Glucose 70 - 99 mg/dL 98  119  147   BUN 8 - 23 mg/dL 15  13  11    Creatinine 0.44 - 1.00 mg/dL 8.29  5.62  1.30   Sodium 135 - 145 mmol/L 137  139  139   Potassium 3.5 - 5.1 mmol/L 4.4  4.8  3.8   Chloride 98 - 111 mmol/L 100  103  101   CO2 22 - 32 mmol/L 26  28  26    Calcium 8.9 - 10.3 mg/dL 9.9  9.6  9.5   Total Protein 6.5 - 8.1 g/dL 7.3  7.1  6.7   Total Bilirubin 0.0 - 1.2 mg/dL 0.6  0.5  0.5   Alkaline Phos 38 - 126 U/L 88  93  95   AST 15 - 41 U/L 17  19  16    ALT 0 - 44 U/L 17  16  15       No results found for: "CEA1", "CEA" / No results found for: "CEA1", "CEA" No results found for: "PSA1" No results found for: "QMV784" No results found for: "CAN125"  No results found for: "TOTALPROTELP", "ALBUMINELP", "A1GS", "A2GS", "BETS", "BETA2SER", "GAMS", "MSPIKE", "SPEI" Lab Results  Component Value Date   TIBC 378 10/01/2023   TIBC 376 09/28/2022   TIBC 358 03/13/2021   FERRITIN 54 10/01/2023   FERRITIN 41 09/28/2022   FERRITIN 44 03/13/2021   IRONPCTSAT 24 10/01/2023   IRONPCTSAT 20 09/28/2022   IRONPCTSAT 16 03/13/2021   Lab Results  Component Value Date   LDH 133 10/01/2023   LDH 129 09/28/2022  LDH 119 09/11/2021     STUDIES:   No results found.

## 2023-10-12 DIAGNOSIS — G620 Drug-induced polyneuropathy: Secondary | ICD-10-CM | POA: Diagnosis not present

## 2023-10-12 DIAGNOSIS — F17219 Nicotine dependence, cigarettes, with unspecified nicotine-induced disorders: Secondary | ICD-10-CM | POA: Diagnosis not present

## 2023-10-12 DIAGNOSIS — M5442 Lumbago with sciatica, left side: Secondary | ICD-10-CM | POA: Diagnosis not present

## 2023-10-12 DIAGNOSIS — Z23 Encounter for immunization: Secondary | ICD-10-CM | POA: Diagnosis not present

## 2023-10-12 DIAGNOSIS — I10 Essential (primary) hypertension: Secondary | ICD-10-CM | POA: Diagnosis not present

## 2023-10-12 DIAGNOSIS — E782 Mixed hyperlipidemia: Secondary | ICD-10-CM | POA: Diagnosis not present

## 2023-10-12 DIAGNOSIS — I700 Atherosclerosis of aorta: Secondary | ICD-10-CM | POA: Diagnosis not present

## 2023-10-12 DIAGNOSIS — J449 Chronic obstructive pulmonary disease, unspecified: Secondary | ICD-10-CM | POA: Diagnosis not present

## 2023-10-12 DIAGNOSIS — C8330 Diffuse large B-cell lymphoma, unspecified site: Secondary | ICD-10-CM | POA: Diagnosis not present

## 2023-10-12 DIAGNOSIS — J439 Emphysema, unspecified: Secondary | ICD-10-CM | POA: Diagnosis not present

## 2023-10-27 ENCOUNTER — Ambulatory Visit (HOSPITAL_COMMUNITY)
Admission: RE | Admit: 2023-10-27 | Discharge: 2023-10-27 | Disposition: A | Discharge status: Home / Self Care | Source: Ambulatory Visit | Attending: Hematology | Admitting: Hematology

## 2023-10-27 DIAGNOSIS — C8330 Diffuse large B-cell lymphoma, unspecified site: Secondary | ICD-10-CM | POA: Diagnosis not present

## 2023-10-27 DIAGNOSIS — F17210 Nicotine dependence, cigarettes, uncomplicated: Secondary | ICD-10-CM | POA: Diagnosis not present

## 2023-10-27 DIAGNOSIS — Z87891 Personal history of nicotine dependence: Secondary | ICD-10-CM | POA: Diagnosis not present

## 2023-10-27 DIAGNOSIS — Z122 Encounter for screening for malignant neoplasm of respiratory organs: Secondary | ICD-10-CM | POA: Diagnosis not present

## 2023-11-22 ENCOUNTER — Telehealth: Payer: Self-pay

## 2023-11-22 NOTE — Progress Notes (Signed)
   11/22/2023  Patient ID: Gabriella Love, female   DOB: [birth year], 68 y.o.   MRN: [id number]   Patient appeared on insurance report for not passing the quality metrics in 2024:  Medication Adherence for Cholesterol (MAC)   Outreach to the patient was not needed today.  Meds Tracking:  -Rosuvastatin  40 mg - Last fill 90DS on 10/23/23, LDL 81 on 10/06/23. Does not qualify for metric yet, next fill due 01/21/24.  Plan:  Up to date on rosuvastatin  fills, review fill history on 01/24/24.   Flint Hummer, PharmD

## 2023-11-26 NOTE — Progress Notes (Unsigned)
 Gabriella Love with [facility] called with report on patient concerning  IMPRESSION: 1. 6.0 mm left upper lobe nodule, enlarged from 03/12/2022. Lung-RADS 4A, suspicious. Follow up low-dose chest CT without contrast in 3 months (please use the following order, "CT CHEST LCS NODULE FOLLOW-UP W/O CM") is recommended. These results will be called to the ordering clinician or representative by the Radiologist Assistant, and communication documented in the PACS or [employer].   Printed results and given to Gabriella Grade, MD. She reviewed and wants patient to come in sooner that appointment that is scheduled in 1 year but that she would like Gabriella Boros, MD to review and let us  know when to put her on for appointment. Placed on Gabriella Boros, MD desk to be reviewed on 11/29/2023. Then will let schedulers know when to schedule for follow up appointment.

## 2023-11-29 ENCOUNTER — Other Ambulatory Visit: Payer: Self-pay

## 2023-11-29 DIAGNOSIS — R911 Solitary pulmonary nodule: Secondary | ICD-10-CM

## 2023-11-29 NOTE — Progress Notes (Signed)
 Ordered CT Chest LCS nodule follow up w/o contrast per Paulett Boros, MD orders. Follow up appointment in 6 months to be scheduled by schedulers up front. Patient aware and agreeable with plan.

## 2023-12-14 ENCOUNTER — Other Ambulatory Visit: Payer: Self-pay | Admitting: Cardiovascular Disease

## 2023-12-14 DIAGNOSIS — E7800 Pure hypercholesterolemia, unspecified: Secondary | ICD-10-CM

## 2024-01-24 ENCOUNTER — Telehealth: Payer: Self-pay

## 2024-01-24 NOTE — Telephone Encounter (Signed)
 Coordinated with pharmacy to refill rosuvastatin , will confirm pickup in 1 week

## 2024-02-01 ENCOUNTER — Telehealth: Payer: Self-pay

## 2024-02-01 NOTE — Telephone Encounter (Signed)
 Overdue on rosuvastatin , has oversupply. Encouraged continued fills the rest of this year but prefers to let her current supply run out. Will check again in October when she estimates she'll be out

## 2024-04-07 DIAGNOSIS — R7303 Prediabetes: Secondary | ICD-10-CM | POA: Diagnosis not present

## 2024-04-07 DIAGNOSIS — E782 Mixed hyperlipidemia: Secondary | ICD-10-CM | POA: Diagnosis not present

## 2024-04-13 DIAGNOSIS — J439 Emphysema, unspecified: Secondary | ICD-10-CM | POA: Diagnosis not present

## 2024-04-13 DIAGNOSIS — I10 Essential (primary) hypertension: Secondary | ICD-10-CM | POA: Diagnosis not present

## 2024-04-13 DIAGNOSIS — M5442 Lumbago with sciatica, left side: Secondary | ICD-10-CM | POA: Diagnosis not present

## 2024-04-13 DIAGNOSIS — E782 Mixed hyperlipidemia: Secondary | ICD-10-CM | POA: Diagnosis not present

## 2024-04-13 DIAGNOSIS — Z23 Encounter for immunization: Secondary | ICD-10-CM | POA: Diagnosis not present

## 2024-04-13 DIAGNOSIS — C8330 Diffuse large B-cell lymphoma, unspecified site: Secondary | ICD-10-CM | POA: Diagnosis not present

## 2024-04-13 DIAGNOSIS — J449 Chronic obstructive pulmonary disease, unspecified: Secondary | ICD-10-CM | POA: Diagnosis not present

## 2024-04-13 DIAGNOSIS — I700 Atherosclerosis of aorta: Secondary | ICD-10-CM | POA: Diagnosis not present

## 2024-04-13 DIAGNOSIS — G620 Drug-induced polyneuropathy: Secondary | ICD-10-CM | POA: Diagnosis not present

## 2024-04-13 DIAGNOSIS — F17219 Nicotine dependence, cigarettes, with unspecified nicotine-induced disorders: Secondary | ICD-10-CM | POA: Diagnosis not present

## 2024-04-24 ENCOUNTER — Other Ambulatory Visit: Payer: Self-pay | Admitting: *Deleted

## 2024-04-24 DIAGNOSIS — I714 Abdominal aortic aneurysm, without rupture, unspecified: Secondary | ICD-10-CM

## 2024-04-27 ENCOUNTER — Other Ambulatory Visit: Payer: Self-pay | Admitting: *Deleted

## 2024-04-27 DIAGNOSIS — I714 Abdominal aortic aneurysm, without rupture, unspecified: Secondary | ICD-10-CM

## 2024-05-16 ENCOUNTER — Encounter: Payer: Self-pay | Admitting: *Deleted

## 2024-05-16 ENCOUNTER — Other Ambulatory Visit: Payer: Self-pay | Admitting: *Deleted

## 2024-05-16 DIAGNOSIS — I714 Abdominal aortic aneurysm, without rupture, unspecified: Secondary | ICD-10-CM

## 2024-05-18 DIAGNOSIS — M542 Cervicalgia: Secondary | ICD-10-CM | POA: Diagnosis not present

## 2024-05-18 DIAGNOSIS — F17219 Nicotine dependence, cigarettes, with unspecified nicotine-induced disorders: Secondary | ICD-10-CM | POA: Diagnosis not present

## 2024-05-18 DIAGNOSIS — Z713 Dietary counseling and surveillance: Secondary | ICD-10-CM | POA: Diagnosis not present

## 2024-05-18 DIAGNOSIS — R42 Dizziness and giddiness: Secondary | ICD-10-CM | POA: Diagnosis not present

## 2024-05-18 DIAGNOSIS — J302 Other seasonal allergic rhinitis: Secondary | ICD-10-CM | POA: Diagnosis not present

## 2024-05-18 DIAGNOSIS — I714 Abdominal aortic aneurysm, without rupture, unspecified: Secondary | ICD-10-CM | POA: Diagnosis not present

## 2024-05-18 DIAGNOSIS — I2510 Atherosclerotic heart disease of native coronary artery without angina pectoris: Secondary | ICD-10-CM | POA: Diagnosis not present

## 2024-05-18 DIAGNOSIS — M7989 Other specified soft tissue disorders: Secondary | ICD-10-CM | POA: Diagnosis not present

## 2024-05-18 DIAGNOSIS — J449 Chronic obstructive pulmonary disease, unspecified: Secondary | ICD-10-CM | POA: Diagnosis not present

## 2024-05-18 DIAGNOSIS — Z6823 Body mass index (BMI) 23.0-23.9, adult: Secondary | ICD-10-CM | POA: Diagnosis not present

## 2024-05-18 DIAGNOSIS — Z7182 Exercise counseling: Secondary | ICD-10-CM | POA: Diagnosis not present

## 2024-05-18 DIAGNOSIS — I10 Essential (primary) hypertension: Secondary | ICD-10-CM | POA: Diagnosis not present

## 2024-05-19 ENCOUNTER — Other Ambulatory Visit (HOSPITAL_COMMUNITY): Payer: Self-pay | Admitting: Internal Medicine

## 2024-05-19 DIAGNOSIS — M7989 Other specified soft tissue disorders: Secondary | ICD-10-CM

## 2024-05-22 ENCOUNTER — Other Ambulatory Visit: Payer: Self-pay

## 2024-05-22 DIAGNOSIS — C8330 Diffuse large B-cell lymphoma, unspecified site: Secondary | ICD-10-CM

## 2024-05-23 ENCOUNTER — Ambulatory Visit: Payer: Self-pay | Admitting: Cardiology

## 2024-05-23 ENCOUNTER — Ambulatory Visit (HOSPITAL_COMMUNITY)
Admission: RE | Admit: 2024-05-23 | Discharge: 2024-05-23 | Disposition: A | Discharge status: Home / Self Care | Source: Ambulatory Visit | Attending: Hematology | Admitting: Hematology

## 2024-05-23 ENCOUNTER — Inpatient Hospital Stay: Attending: Oncology

## 2024-05-23 ENCOUNTER — Ambulatory Visit (HOSPITAL_COMMUNITY)
Admission: RE | Admit: 2024-05-23 | Discharge: 2024-05-23 | Disposition: A | Discharge status: Home / Self Care | Source: Ambulatory Visit | Attending: Cardiology | Admitting: Cardiology

## 2024-05-23 ENCOUNTER — Ambulatory Visit (HOSPITAL_COMMUNITY)
Admission: RE | Admit: 2024-05-23 | Discharge: 2024-05-23 | Disposition: A | Discharge status: Home / Self Care | Source: Ambulatory Visit | Attending: Internal Medicine | Admitting: Internal Medicine

## 2024-05-23 DIAGNOSIS — I700 Atherosclerosis of aorta: Secondary | ICD-10-CM | POA: Diagnosis not present

## 2024-05-23 DIAGNOSIS — J439 Emphysema, unspecified: Secondary | ICD-10-CM | POA: Insufficient documentation

## 2024-05-23 DIAGNOSIS — I714 Abdominal aortic aneurysm, without rupture, unspecified: Secondary | ICD-10-CM | POA: Insufficient documentation

## 2024-05-23 DIAGNOSIS — I10 Essential (primary) hypertension: Secondary | ICD-10-CM | POA: Diagnosis not present

## 2024-05-23 DIAGNOSIS — M7989 Other specified soft tissue disorders: Secondary | ICD-10-CM | POA: Insufficient documentation

## 2024-05-23 DIAGNOSIS — R7989 Other specified abnormal findings of blood chemistry: Secondary | ICD-10-CM | POA: Insufficient documentation

## 2024-05-23 DIAGNOSIS — E785 Hyperlipidemia, unspecified: Secondary | ICD-10-CM | POA: Insufficient documentation

## 2024-05-23 DIAGNOSIS — C8330 Diffuse large B-cell lymphoma, unspecified site: Secondary | ICD-10-CM

## 2024-05-23 DIAGNOSIS — F17210 Nicotine dependence, cigarettes, uncomplicated: Secondary | ICD-10-CM | POA: Insufficient documentation

## 2024-05-23 DIAGNOSIS — I2510 Atherosclerotic heart disease of native coronary artery without angina pectoris: Secondary | ICD-10-CM | POA: Diagnosis not present

## 2024-05-23 DIAGNOSIS — R918 Other nonspecific abnormal finding of lung field: Secondary | ICD-10-CM | POA: Diagnosis not present

## 2024-05-23 DIAGNOSIS — Z0389 Encounter for observation for other suspected diseases and conditions ruled out: Secondary | ICD-10-CM | POA: Diagnosis not present

## 2024-05-23 DIAGNOSIS — Z8572 Personal history of non-Hodgkin lymphomas: Secondary | ICD-10-CM | POA: Insufficient documentation

## 2024-05-23 DIAGNOSIS — E538 Deficiency of other specified B group vitamins: Secondary | ICD-10-CM | POA: Insufficient documentation

## 2024-05-23 DIAGNOSIS — R911 Solitary pulmonary nodule: Secondary | ICD-10-CM | POA: Insufficient documentation

## 2024-05-23 LAB — VITAMIN B12: Vitamin B-12: 455 pg/mL (ref 180–914)

## 2024-05-23 LAB — CBC WITH DIFFERENTIAL/PLATELET
Abs Immature Granulocytes: 0.01 K/uL (ref 0.00–0.07)
Basophils Absolute: 0.1 K/uL (ref 0.0–0.1)
Basophils Relative: 1 %
Eosinophils Absolute: 0 K/uL (ref 0.0–0.5)
Eosinophils Relative: 1 %
HCT: 47.5 % — ABNORMAL HIGH (ref 36.0–46.0)
Hemoglobin: 15.7 g/dL — ABNORMAL HIGH (ref 12.0–15.0)
Immature Granulocytes: 0 %
Lymphocytes Relative: 24 %
Lymphs Abs: 1.4 K/uL (ref 0.7–4.0)
MCH: 31.2 pg (ref 26.0–34.0)
MCHC: 33.1 g/dL (ref 30.0–36.0)
MCV: 94.4 fL (ref 80.0–100.0)
Monocytes Absolute: 0.5 K/uL (ref 0.1–1.0)
Monocytes Relative: 9 %
Neutro Abs: 3.8 K/uL (ref 1.7–7.7)
Neutrophils Relative %: 65 %
Platelets: 203 K/uL (ref 150–400)
RBC: 5.03 MIL/uL (ref 3.87–5.11)
RDW: 12.9 % (ref 11.5–15.5)
WBC: 5.9 K/uL (ref 4.0–10.5)
nRBC: 0 % (ref 0.0–0.2)

## 2024-05-23 LAB — COMPREHENSIVE METABOLIC PANEL WITH GFR
ALT: 16 U/L (ref 0–44)
AST: 22 U/L (ref 15–41)
Albumin: 4.7 g/dL (ref 3.5–5.0)
Alkaline Phosphatase: 87 U/L (ref 38–126)
Anion gap: 11 (ref 5–15)
BUN: 14 mg/dL (ref 8–23)
CO2: 27 mmol/L (ref 22–32)
Calcium: 9.8 mg/dL (ref 8.9–10.3)
Chloride: 103 mmol/L (ref 98–111)
Creatinine, Ser: 0.98 mg/dL (ref 0.44–1.00)
GFR, Estimated: >60 mL/min (ref >60)
Glucose, Bld: 108 mg/dL — ABNORMAL HIGH (ref 70–99)
Potassium: 4.4 mmol/L (ref 3.5–5.1)
Sodium: 141 mmol/L (ref 135–145)
Total Bilirubin: 0.6 mg/dL (ref 0.0–1.2)
Total Protein: 6.8 g/dL (ref 6.5–8.1)

## 2024-05-23 LAB — IRON AND TIBC
Iron: 81 ug/dL (ref 28–170)
Saturation Ratios: 21 % (ref 10.4–31.8)
TIBC: 379 ug/dL (ref 250–450)
UIBC: 299 ug/dL

## 2024-05-23 LAB — FERRITIN: Ferritin: 65 ng/mL (ref 11–307)

## 2024-05-23 LAB — LACTATE DEHYDROGENASE: LDH: 174 U/L (ref 98–192)

## 2024-06-01 ENCOUNTER — Inpatient Hospital Stay: Attending: Hematology | Admitting: Oncology

## 2024-06-01 DIAGNOSIS — Z9221 Personal history of antineoplastic chemotherapy: Secondary | ICD-10-CM | POA: Diagnosis not present

## 2024-06-01 DIAGNOSIS — R918 Other nonspecific abnormal finding of lung field: Secondary | ICD-10-CM | POA: Diagnosis not present

## 2024-06-01 DIAGNOSIS — Z8572 Personal history of non-Hodgkin lymphomas: Secondary | ICD-10-CM | POA: Diagnosis present

## 2024-06-01 DIAGNOSIS — I714 Abdominal aortic aneurysm, without rupture, unspecified: Secondary | ICD-10-CM | POA: Diagnosis not present

## 2024-06-01 DIAGNOSIS — M7989 Other specified soft tissue disorders: Secondary | ICD-10-CM | POA: Insufficient documentation

## 2024-06-01 DIAGNOSIS — E538 Deficiency of other specified B group vitamins: Secondary | ICD-10-CM | POA: Insufficient documentation

## 2024-06-01 DIAGNOSIS — C8330 Diffuse large B-cell lymphoma, unspecified site: Secondary | ICD-10-CM | POA: Diagnosis not present

## 2024-06-01 DIAGNOSIS — Z87891 Personal history of nicotine dependence: Secondary | ICD-10-CM | POA: Diagnosis not present

## 2024-06-01 DIAGNOSIS — Z122 Encounter for screening for malignant neoplasm of respiratory organs: Secondary | ICD-10-CM

## 2024-06-01 DIAGNOSIS — F17210 Nicotine dependence, cigarettes, uncomplicated: Secondary | ICD-10-CM | POA: Diagnosis not present

## 2024-06-01 DIAGNOSIS — Z806 Family history of leukemia: Secondary | ICD-10-CM | POA: Insufficient documentation

## 2024-06-01 DIAGNOSIS — D751 Secondary polycythemia: Secondary | ICD-10-CM | POA: Insufficient documentation

## 2024-06-01 NOTE — Progress Notes (Signed)
 [facility] [address] [place] [address]    Clinic Day:  06/01/2024  Referring physician: Shona Norleen PEDLAR, MD  Patient Care Team: Shona Norleen PEDLAR, MD as PCP - General (Internal Medicine) Pietro Redell RAMAN, MD as PCP - Cardiology (Cardiology)   ASSESSMENT & PLAN:   Assessment: 1.  Stage IIIb large B-cell lymphoma: -Presentation with weight loss, night sweats, adenopathy in the neck, abdomen and right inguinal area -6 cycles of R-CHOP from 07/30/2016 through 11/12/2016 - Denies fevers, night sweats or weight loss.  Denies any recurrent infections or hospitalizations. -CT CAP on 06/21/2018 did not show any evidence of adenopathy in the chest, abdomen and pelvis.  Innumerable new groundglass nodules in both lungs.    Plan: 1.  Stage IIIb large B-cell lymphoma: - Denies any fevers, night sweats or weight loss. - Physical exam: No evidence of lymphadenopathy or splenomegaly. - Labs from 06/01/24: Normal LFTs and LDH.  CBC shows erythrocytosis which is stable.  No cytopenias. - No evidence of recurrence.  RTC 1 year for follow-up.   2.  Smoking history: - She is smoking about 1-1.5 packs of cigarettes per day.   -Repeat CT lung cancer screening from 05/23/2024 due to a concerning nodule was lung RADS 2 benign appearance or behavior.  Continue screening every year.   3.  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   -CBC from 05/23/2024 shows hemoglobin of 15.7 which is more or less stable from previous.  Hematocrit 47.5.  She is asymptomatic without hyperviscosity symptoms.  Continue to monitor.   4.  B12 deficiency: - B12 level is 455.  Continue B12 1 mg tablet daily.  5.  Aortic aneurysm: -Followed by cardiology.  Recently had a aortic ultrasound which showed abdominal aortic aneurysm measuring 3.8 cm consistent with intraforaminal aneurysm.  Recommend surveillance every 2 to 3 years.  6.  Left lower extremity swelling: -She was sent by  her PCP for rule out of a blood clot.  Ultrasound on 05/15/2024 was negative for blood clot.  Orders Placed This Encounter  Procedures   CT CHEST LUNG CA SCREEN LOW DOSE W/O CM    Standing Status:   Future    Expected Date:   06/01/2025    Expiration Date:   08/30/2025    Preferred Imaging Location?:   [facility]   Iron and TIBC ([facility] ONLY)    Standing Status:   Future    Expected Date:   06/01/2025    Expiration Date:   08/30/2025   Ferritin    Standing Status:   Future    Expected Date:   06/01/2025    Expiration Date:   08/30/2025   Vitamin B12    Standing Status:   Future    Expected Date:   06/01/2025    Expiration Date:   08/30/2025   Lactate dehydrogenase    Standing Status:   Future    Expected Date:   06/01/2025    Expiration Date:   08/30/2025   Comprehensive metabolic panel    Standing Status:   Future    Expected Date:   06/01/2025    Expiration Date:   08/30/2025   CBC with Differential    Standing Status:   Future    Expected Date:   06/01/2025    Expiration Date:   08/30/2025    Delon FORBES Hope, NP   11/6/20253:06 PM  CHIEF COMPLAINT:   Diagnosis: DLBCL  Cancer Staging  Lymphoma, large-cell, diffuse (HCC) Staging form: Hodgkin and Non-Hodgkin Lymphoma, AJCC 8th Edition - Clinical stage from 07/31/2016: Stage III (Diffuse large B-cell lymphoma) - Signed by Berry Debby RAMAN, PA-C on 07/31/2016    Prior Therapy: R-CHOP x 6 cycles from 07/30/2016 to 11/12/2016   Current Therapy:  surveillance    HISTORY OF PRESENT ILLNESS:   Oncology History  Lymphoma, large-cell, diffuse (HCC)  07/13/2016 Initial Biopsy   L inguinal biopsy with Dr. 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 Grade (if applicable): High grade. Flow cytometry:  [id number] reveals a monoclonal B-cell population with expression of CD10. Immunohistochemical stains: CD20, CD3, CD10, bcl-2, bcl-6, CD21, CD23, Ki-67. Touch preps/imprints: Mixture of large and smaller lymphocytes with irregular nuclear contours. Comments: Sections of lymph node reveal effacement of the architecture by a nodular proliferation of neoplastic appearing follicles. In many of the areas the follicles coalesce and form sheets of large cells. The more formed follicles predominately consist of numerous large centroblasts (>15 per hpf), representing a high grade (3B) follicular component. The diffuse areas are also composed of large cells but lack follicular dendritic networks (CD21, CD23). Immunohistochemistry reveals the atypical lymphocytes are positive for CD20, CD10, and bcl-6. They are negative for bcl-2. Ki-67 is elevated (30% overall). CD3 reveals residual surrounding T-cells. Flow cytometry ([id number]) reveals a monoclonal B-cell population with expression of CD10. 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 The case was called to Dr. Nicholaus on 07/16/2016.   07/23/2016 Echocardiogram   Left ventricle: Systolic function was normal. The estimated   ejection fraction was in the range of 50% to 55%. Doppler   parameters are consistent with abnormal left ventricular   relaxation (grade 1 diastolic dysfunction).   07/24/2016 Procedure   Technically successful CT guided right iliac bone core and aspiration biopsy by IR.   07/28/2016 PET scan   1. Hypermetabolic lymphadenopathy in the neck, abdomen and right inguinal area as described above, consistent with known lymphoma. 2. Focal area of hypermetabolism in the appendix could be lymphomatous involvement. Recommend attention on follow-up scans. 3. Age advanced atherosclerotic calcifications involving the abdominal aorta and branch vessels and age advanced  three-vessel coronary artery calcifications. 4. No obvious osseous involvement. 5. Emphysematous changes and pulmonary scarring.   07/28/2016 Pathology Results   Bone Marrow, Aspirate,Biopsy, and Clot, right iliac - NORMOCELLULAR BONE MARROW WITH TRILINEAGE HEMATOPOIESIS. PERIPHERAL BLOOD: - OCCASIONAL CIRCULATING ATYPICAL LYMPHOID CELLS.   07/28/2016 Pathology Results   Bone Marrow Flow Cytometry - NO MONOCLONAL B CELL POPULATION OR ABNORMAL T CELL PHENOTYPE IDENTIFIED.   07/30/2016 Pathology Results   Peripheral Blood Flow Cytometry - NO MONOCLONAL B-CELL POPULATION OR ABNORMAL T-CELL PHENOTYPE IDENTIFIED.   07/30/2016 -  Chemotherapy   The patient had DOXOrubicin  (ADRIAMYCIN ) chemo injection 78 mg, 50 mg/m2 = 78 mg, Intravenous,  Once, 2 of 6 cycles  palonosetron  (ALOXI ) injection 0.25 mg, 0.25 mg, Intravenous,  Once, 2 of 6 cycles  pegfilgrastim  (NEULASTA  ONPRO KIT) injection 6 mg, 6 mg, Subcutaneous, Once, 2 of 6 cycles  vinCRIStine  (ONCOVIN ) 2 mg in sodium chloride  0.9 % 50 mL chemo infusion, 2 mg, Intravenous,  Once, 2 of 6 cycles  riTUXimab  (RITUXAN ) 600 mg in sodium chloride  0.9 % 250 mL (1.9355 mg/mL) chemo infusion, 375  mg/m2 = 600 mg, Intravenous,  Once, 2 of 6 cycles  cyclophosphamide  (CYTOXAN ) 1,180 mg in sodium chloride  0.9 % 250 mL chemo infusion, 750 mg/m2 = 1,180 mg, Intravenous,  Once, 2 of 6 cycles  for chemotherapy treatment.     08/03/2016 Pathology Results   Cytogenetic lab results [facility]). Cytogenetic Analysis: Normal.    09/23/2016 PET scan   1. Significantly improved appearance, with resolution of prior hypermetabolic activity in all of the previously involved lymph nodes. All lymph nodes are currently within normal size limits. 2. Other imaging findings of potential clinical significance: Chronic right sphenoid sinusitis. Right mastoid effusion. Coronary, aortic arch, and branch vessel atherosclerotic vascular disease. Severe emphysema. Aortoiliac  atherosclerotic vascular disease.   11/12/2016 -  Chemotherapy   Completed cycle 6 of RCHOP    12/11/2016 PET scan   1. No evidence of residual hypermetabolic lymphoma or adenopathy. 2. Age advanced coronary artery atherosclerosis. Recommend assessment of coronary risk factors and consideration of medical therapy. 3. Centrilobular emphysema.   06/18/2017 PET scan   IMPRESSION: 1. No new or recurrent hypermetabolic adenopathy in the neck, chest, abdomen, or pelvis. 2. There is potentially some mild wall thickening along a 6 cm segment of the distal ileum. Scattered accentuated metabolic activity in the bowel, likely physiologic. There is some fat deposition in the proximal colon wall, which is typically considered incidental although there is a weak association with inflammatory bowel disease. 3. Aortic Atherosclerosis (ICD10-I70.0) and Emphysema (ICD10-J43.9). Coronary atherosclerosis.      INTERVAL HISTORY:   Gabriella Love is a 68 y.o. female presenting to clinic today for follow up of DLBCL.   Reports overall she is doing well since her last visit.  She recently had a low-dose CT scan for lung cancer screening and she is here to review the results.  She denies any new lumps or bumps.  Appetite is 100% energy levels are 50%.  Denies any pain.  She has cough and shortness of breath secondary to smoking.  She recently started back on her B12 supplements because she felt like this helped her energy levels.  She continues to smoke 1 to 1.5 packs/day.  Weight is stable.  She recently had a left lower extremity Doppler for lower extremity swelling and pain in her ankle, follow-up for surveillance of her aortic aneurysm with aortic ultrasound followed by cardiology and a low-dose CT scan.  No concerns.  PAST MEDICAL HISTORY:   Past Medical History: Past Medical History:  Diagnosis Date   Anxiety    Arthritis    CAD (coronary artery disease)    COPD (chronic obstructive pulmonary  disease) (HCC)    DLBCL (diffuse large B cell lymphoma) (HCC) 07/16/2016   GERD (gastroesophageal reflux disease)    Hyperlipidemia    Hypertension    Irritable bowel syndrome (IBS)    with diarrhea   Myocardial infarction (HCC) 12/19/2009   released from cardiology    Surgical History: Past Surgical History:  Procedure Laterality Date   CARDIAC CATHETERIZATION     cardiac stent   CHOLECYSTECTOMY     CORONARY STENT PLACEMENT  2011   INGUINAL LYMPH NODE BIOPSY Left 07/13/2016   Procedure: EXCISIONAL BIOPSY OF LEFT INGUINAL LYMPH NODE;  Surgeon: Selinda Artist Moats, MD;  Location: [facility];  Service: General;  Laterality: Left;   PORT-A-CATH REMOVAL Right 05/20/2018   Procedure: MINOR REMOVAL PORT-A-CATH;  Surgeon: Mavis Anes, MD;  Location: [facility];  Service: General;  Laterality: Right;   PORTACATH PLACEMENT N/A 07/17/2016  Procedure: INSERTION OF TUNNELED CENTRAL VENOUS CATHETER WITH SUBCUTANEOUS PORT;  Surgeon: Selinda Artist Moats, MD;  Location: [facility];  Service: Vascular;  Laterality: N/A;   TUBAL LIGATION      Social History: Social History   Socioeconomic History   Marital status: Single    Spouse name: Not on file   Number of children: 2   Years of education: Not on file   Highest education level: Not on file  Occupational History   Not on file  Tobacco Use   Smoking status: Every Day    Current packs/day: 0.50    Average packs/day: 0.5 packs/day for 40.0 years (20.0 ttl pk-yrs)    Types: Cigarettes   Smokeless tobacco: Never  Substance and Sexual Activity   Alcohol use: No   Drug use: No   Sexual activity: Yes    Birth control/protection: Post-menopausal  Other Topics Concern   Not on file  Social History Narrative   Not on file   Social Drivers of Health   Financial Resource Strain: Not on file  Food Insecurity: Not on file  Transportation Needs: Not on file  Physical Activity: Not on file  Stress: Not on file  Social Connections: Not on file   Intimate Partner Violence: Not on file    Family History: Family History  Problem Relation Age of Onset   Cancer Mother        colon   Depression Mother    Heart disease Father    Depression Sister    Cancer Sister        leukemia   Hyperlipidemia Brother    Hypertension Brother     Current Medications:  Current Outpatient Medications:    AIRSUPRA 90-80 MCG/ACT AERO, SMARTSIG:2 Puff(s) By Mouth Every 8 Hours PRN, Disp: , Rfl:    albuterol (VENTOLIN HFA) 108 (90 Base) MCG/ACT inhaler, , Disp: , Rfl:    ALPRAZolam (XANAX) 0.5 MG tablet, Take 0.25-0.5 mg by mouth daily as needed for anxiety., Disp: , Rfl:    aspirin  81 MG chewable tablet, , Disp: , Rfl:    buPROPion (WELLBUTRIN XL) 150 MG 24 hr tablet, Take 150 mg by mouth daily., Disp: , Rfl:    fluticasone (FLONASE) 50 MCG/ACT nasal spray, Place into both nostrils daily., Disp: , Rfl:    gabapentin  (NEURONTIN ) 100 MG capsule, Take 1 capsule by mouth in the morning and at bedtime., Disp: , Rfl:    nitroGLYCERIN  (NITROSTAT ) 0.4 MG SL tablet, Place under the tongue., Disp: , Rfl:    pantoprazole  (PROTONIX ) 40 MG tablet, Take 40 mg by mouth daily. Pt takes at supper daily, Disp: , Rfl:    rosuvastatin  (CRESTOR ) 40 MG tablet, TAKE 1 TABLET(40 MG) BY MOUTH DAILY, Disp: 30 tablet, Rfl: 0   Allergies: Allergies  Allergen Reactions   Amoxicillin Nausea And Vomiting    Yeast Infections  Has patient had a PCN reaction causing immediate rash, facial/tongue/throat swelling, SOB or lightheadedness with hypotension: No Has patient had a PCN reaction causing severe rash involving mucus membranes or skin necrosis: No Has patient had a PCN reaction that required hospitalization No Has patient had a PCN reaction occurring within the last 10 years: No If all of the above answers are NO, then may proceed with Cephalosporin use.    Choline Fenofibrate     REACTION: Abd pain and elevated Lipase   Ezetimibe Nausea And Vomiting   Penicillins      Yeast infections, thrush  Has patient had a  PCN reaction causing immediate rash, facial/tongue/throat swelling, SOB or lightheadedness with hypotension: No Has patient had a PCN reaction causing severe rash involving mucus membranes or skin necrosis: No Has patient had a PCN reaction that required hospitalization No Has patient had a PCN reaction occurring within the last 10 years: No If all of the above answers are NO, then may proceed with Cephalosporin use.    Percocet [Oxycodone-Acetaminophen ] Nausea And Vomiting    REVIEW OF SYSTEMS:   Review of Systems  Constitutional:  Positive for fatigue.  Respiratory:  Positive for cough and shortness of breath.      VITALS:   Blood pressure (!) 135/57, pulse 90, temperature (!) 97.5 F (36.4 C), temperature source Tympanic, resp. rate 18, height 5' 1.5 (1.562 m), weight 120 lb (54.4 kg), SpO2 97%.  Wt Readings from Last 3 Encounters:  06/01/24 120 lb (54.4 kg)  10/07/23 111 lb 5.3 oz (50.5 kg)  10/05/22 123 lb 3.2 oz (55.9 kg)    Body mass index is 22.31 kg/m.  Performance status (ECOG): 1 - Symptomatic but completely ambulatory  PHYSICAL EXAM:   Physical Exam Constitutional:      Appearance: Normal appearance.  Cardiovascular:     Rate and Rhythm: Normal rate and regular rhythm.  Pulmonary:     Effort: Pulmonary effort is normal.     Breath sounds: Normal breath sounds.  Abdominal:     General: Bowel sounds are normal.     Palpations: Abdomen is soft.  Musculoskeletal:        General: No swelling. Normal range of motion.  Lymphadenopathy:     Cervical: No cervical adenopathy.  Neurological:     Mental Status: She is alert and oriented to person, place, and time. Mental status is at baseline.     LABS:      Latest Ref Rng & Units 05/23/2024    8:03 AM 10/01/2023   11:51 AM 09/28/2022   12:41 PM  CBC  WBC 4.0 - 10.5 K/uL 5.9  6.3  6.6   Hemoglobin 12.0 - 15.0 g/dL 84.2  83.7  84.8   Hematocrit 36.0 - 46.0 %  47.5  48.8  45.4   Platelets 150 - 400 K/uL 203  211  222       Latest Ref Rng & Units 05/23/2024    8:03 AM 10/01/2023   11:51 AM 09/28/2022   12:41 PM  CMP  Glucose 70 - 99 mg/dL 891  98  873   BUN 8 - 23 mg/dL 14  15  13    Creatinine 0.44 - 1.00 mg/dL 9.01  9.08  9.05   Sodium 135 - 145 mmol/L 141  137  139   Potassium 3.5 - 5.1 mmol/L 4.4  4.4  4.8   Chloride 98 - 111 mmol/L 103  100  103   CO2 22 - 32 mmol/L 27  26  28    Calcium  8.9 - 10.3 mg/dL 9.8  9.9  9.6   Total Protein 6.5 - 8.1 g/dL 6.8  7.3  7.1   Total Bilirubin 0.0 - 1.2 mg/dL 0.6  0.6  0.5   Alkaline Phos 38 - 126 U/L 87  88  93   AST 15 - 41 U/L 22  17  19    ALT 0 - 44 U/L 16  17  16       No results found for: CEA1, CEA / No results found for: CEA1, CEA No results found for: PSA1 No results found for:  [id number] No results found for: CAN125  No results found for: STEPHANY CARLOTA BENSON MARKEL EARLA JOANNIE DOC, MSPIKE, SPEI Lab Results  Component Value Date   TIBC 379 05/23/2024   TIBC 378 10/01/2023   TIBC 376 09/28/2022   FERRITIN 65 05/23/2024   FERRITIN 54 10/01/2023   FERRITIN 41 09/28/2022   IRONPCTSAT 21 05/23/2024   IRONPCTSAT 24 10/01/2023   IRONPCTSAT 20 09/28/2022   Lab Results  Component Value Date   LDH 174 05/23/2024   LDH 133 10/01/2023   LDH 129 09/28/2022     STUDIES:   CT CHEST LCS NODULE F/U LOW DOSE WO CONTRAST Result Date: 05/29/2024 CLINICAL DATA:  Abnormal lung cancer screening CT. Current 52 pack-year smoker. EXAM: CT CHEST WITHOUT CONTRAST FOR LUNG CANCER SCREENING NODULE FOLLOW-UP TECHNIQUE: Multidetector CT imaging of the chest was performed following the standard protocol without IV contrast. RADIATION DOSE REDUCTION: This exam was performed according to the departmental dose-optimization program which includes automated exposure control, adjustment of the mA and/or kV according to patient size and/or use of iterative reconstruction  technique. COMPARISON:  10/27/2023. FINDINGS: Cardiovascular: Atherosclerotic calcification of the aorta, aortic valve and coronary arteries. Heart is at the upper limits of normal in size. No pericardial effusion. Mediastinum/Nodes: No pathologically enlarged mediastinal or axillary lymph nodes. Hilar regions are difficult to definitively evaluate without IV contrast. Esophagus is grossly unremarkable. Lungs/Pleura: Centrilobular and paraseptal emphysema. 4.4 mm central left upper lobe nodule has decreased in size from 6.0 mm on 10/27/2023 and is stable or minimally smaller than on 03/12/2022, 4.8 mm. Other pulmonary nodules measure 9.2 mm or less in size, as before. No new pulmonary nodules. No pleural fluid. Debris in the airway. Upper Abdomen: Cholecystectomy. Probable tiny renal vascular calcification the upper pole right kidney. Partially imaged abdominal aortic aneurysm measures 3.7 cm. Visualized portions of the liver, adrenal glands, kidneys, spleen, pancreas, stomach and bowel are otherwise grossly unremarkable. No upper abdominal adenopathy. Musculoskeletal: Osteopenia. IMPRESSION: 1. Lung-RADS 2, benign appearance or behavior. Continue annual screening with low-dose chest CT without contrast in 12 months. 2. Partially imaged 3.1 cm abdominal aortic aneurysm, previously 3.3 cm on CT abdomen pelvis 05/20/2022. Recommend follow-up every 3 years. This recommendation follows ACR consensus guidelines: White Paper of the ACR Incidental Findings Committee II on Vascular Findings. J Am Coll Radiol 2013; 10:789-794. 3. Aortic atherosclerosis (ICD10-I70.0). Coronary artery calcification. 4.  Emphysema (ICD10-J43.9). Electronically Signed   By: Newell Eke M.D.   On: 05/29/2024 08:39   US  AORTA Result Date: 05/23/2024 EXAM: ULTRASOUND AORTA CLINICAL HISTORY: AAA. hypertension, coronary artery disease, hyperlipidemia, tobacco abuse TECHNIQUE: Duplex ultrasound using B-mode/gray scaled imaging, Doppler  spectral analysis and color flow Doppler was obtained of the aorta. COMPARISON: CT 05/20/2022 and previous. FINDINGS: AORTA: Abdomthrombus inal aorta AP and transverse measurements are as follows: proximal 2.5 x 2.4 cm, mid 3.1 x 2.9 cm, distal 3.7 x 3.8 cm. Proximal aortic velocity is 204 cm/sec. eccentric nonocclusive plaque in the aneurysmal segment. Within the aneurysmal segment, velocity is 118 cm/sec. ILIACS: Common iliac arteries are patent. Right iliac artery measures 1.1 cm x1.0 cm. Left iliac artery measures 1.0 cm x 1.0 cm. IMPRESSION: 1. Abdominal aortic aneurysm, distal segment measuring up to 3.8 cm, consistent with infrarenal aneurysm. 2. According to ACR 2013 and SVS 2018 guidelines, the recommendation is surveillance with imaging every 3 years (SVS) or every 2 years (ACR). Electronically signed by: Katheleen Faes MD 05/23/2024 10:47 AM EDT RP Workstation: [id number]   US  Venous  Img Lower Unilateral Left (DVT) Result Date: 05/23/2024 EXAM: ULTRASOUND DUPLEX OF THE LEFT LOWER EXTREMITY VEINS TECHNIQUE: Duplex ultrasound using B-mode/gray scaled imaging and Doppler spectral analysis and color flow was obtained of the deep venous structures of the left lower extremity. COMPARISON: None. CLINICAL HISTORY: eval DVT. FINDINGS: The common femoral vein, femoral vein, popliteal vein, and posterior tibial vein of the left lower extremity demonstrate normal compressibility with normal color flow and spectral analysis. Limited images of the contralateral right common femoral vein are normal. IMPRESSION: 1. No evidence of DVT in the left lower extremity. Electronically signed by: Katheleen Faes MD 05/23/2024 10:41 AM EDT RP Workstation: [id number]

## 2024-06-15 NOTE — Progress Notes (Signed)
 HPI:FU CAD. Patient had PCI of her right coronary artery in 2011 in the setting of myocardial infarction.  Records are not available.  Chest CT August 2022 showed emphysema, aortic atherosclerosis and calcium  in the left main as well as three-vessel coronary artery disease.  Nuclear study March 2023 showed ejection fraction 45% and no ischemia.  Echocardiogram March 2023 showed normal LV function, grade 1 diastolic dysfunction.  Abd ultrasound October 2025 showed abdominal aortic aneurysm 3.8 cm.  Since last seen, she occasionally has dyspnea but denies chest pain, palpitations or syncope.  Current Outpatient Medications  Medication Sig Dispense Refill   AIRSUPRA 90-80 MCG/ACT AERO SMARTSIG:2 Puff(s) By Mouth Every 8 Hours PRN     albuterol (VENTOLIN HFA) 108 (90 Base) MCG/ACT inhaler      ALPRAZolam (XANAX) 0.5 MG tablet Take 0.25-0.5 mg by mouth daily as needed for anxiety.     aspirin  81 MG chewable tablet      buPROPion (WELLBUTRIN XL) 150 MG 24 hr tablet Take 150 mg by mouth daily.     fluticasone (FLONASE) 50 MCG/ACT nasal spray Place into both nostrils daily.     gabapentin  (NEURONTIN ) 100 MG capsule Take 1 capsule by mouth in the morning and at bedtime.     nitroGLYCERIN  (NITROSTAT ) 0.4 MG SL tablet Place under the tongue.     pantoprazole  (PROTONIX ) 40 MG tablet Take 40 mg by mouth daily. Pt takes at supper daily     rosuvastatin  (CRESTOR ) 40 MG tablet TAKE 1 TABLET(40 MG) BY MOUTH DAILY 30 tablet 0   No current facility-administered medications for this visit.     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   GERD (gastroesophageal reflux disease)    Hyperlipidemia    Hypertension    Irritable bowel syndrome (IBS)    with diarrhea   Myocardial infarction (HCC) 12/19/2009   released from cardiology    Past  Surgical History:  Procedure Laterality Date   CARDIAC CATHETERIZATION     cardiac stent   CHOLECYSTECTOMY     CORONARY STENT PLACEMENT  2011   INGUINAL LYMPH NODE BIOPSY Left 07/13/2016   Procedure: EXCISIONAL BIOPSY OF LEFT INGUINAL LYMPH NODE;  Surgeon: Selinda Artist Moats, MD;  Location: [facility];  Service: General;  Laterality: Left;   PORT-A-CATH REMOVAL Right 05/20/2018   Procedure: MINOR REMOVAL PORT-A-CATH;  Surgeon: Mavis Anes, MD;  Location: [facility];  Service: General;  Laterality: Right;   PORTACATH PLACEMENT N/A 07/17/2016   Procedure: INSERTION OF TUNNELED CENTRAL VENOUS CATHETER WITH SUBCUTANEOUS PORT;  Surgeon: Selinda Artist Moats, MD;  Location: [facility];  Service: Vascular;  Laterality: N/A;   TUBAL LIGATION      Social History   Socioeconomic History   Marital status: Single    Spouse name: Not on file   Number of children: 2   Years of education: Not on file   Highest education level: Not on file  Occupational History   Not on file  Tobacco Use   Smoking status: Every Day    Current packs/day: 0.50    Average packs/day: 0.5 packs/day for 40.0 years (20.0 ttl pk-yrs)    Types: Cigarettes   Smokeless tobacco: Never  Substance and Sexual Activity   Alcohol use: No   Drug use: No  Sexual activity: Yes    Birth control/protection: Post-menopausal  Other Topics Concern   Not on file  Social History Narrative   Not on file   Social Drivers of Health   Financial Resource Strain: Not on file  Food Insecurity: Not on file  Transportation Needs: Not on file  Physical Activity: Not on file  Stress: Not on file  Social Connections: Not on file  Intimate Partner Violence: Not on file    Family History  Problem Relation Age of Onset   Cancer Mother        colon   Depression Mother    Heart disease Father    Depression Sister    Cancer Sister        leukemia   Hyperlipidemia Brother    Hypertension Brother     ROS: no fevers or chills, productive cough,  hemoptysis, dysphasia, odynophagia, melena, hematochezia, dysuria, hematuria, rash, seizure activity, orthopnea, PND, pedal edema, claudication. Remaining systems are negative.  Physical Exam: Well-developed well-nourished in no acute distress.  Skin is warm and dry.  HEENT is normal.  Neck is supple.  Chest is clear to auscultation with normal expansion.  Cardiovascular exam is regular rate and rhythm.  Abdominal exam nontender or distended. No masses palpated. Extremities show no edema. neuro grossly intact  EKG Interpretation Date/Time:  Thursday June 29 2024 09:17:12 EST Ventricular Rate:  81 PR Interval:  138 QRS Duration:  78 QT Interval:  384 QTC Calculation: 446 R Axis:   76  Text Interpretation: Normal sinus rhythm Normal ECG Confirmed by Pietro Rogue ([id number]) on 06/29/2024 9:17:50 AM    A/P  1 CAD-no CP; continue ASA and statin.  2 Abdominal aortic aneurysm-plan fu ultrasound 10/26.  3 hyperlipidemia-continue statin.  Check lipids and LPa.  Recent LFTs normal.  4 tobacco abuse-patient counseled on discontinuing.  Rogue Pietro, MD

## 2024-06-29 ENCOUNTER — Encounter: Payer: Self-pay | Admitting: Cardiology

## 2024-06-29 ENCOUNTER — Ambulatory Visit: Attending: Cardiology | Admitting: Cardiology

## 2024-06-29 VITALS — BP 132/62 | HR 81 | Ht 61.0 in | Wt 120.0 lb

## 2024-06-29 DIAGNOSIS — I10 Essential (primary) hypertension: Secondary | ICD-10-CM

## 2024-06-29 DIAGNOSIS — I2510 Atherosclerotic heart disease of native coronary artery without angina pectoris: Secondary | ICD-10-CM

## 2024-06-29 DIAGNOSIS — E7800 Pure hypercholesterolemia, unspecified: Secondary | ICD-10-CM | POA: Diagnosis not present

## 2024-06-29 DIAGNOSIS — I714 Abdominal aortic aneurysm, without rupture, unspecified: Secondary | ICD-10-CM | POA: Diagnosis not present

## 2024-06-29 LAB — LIPID PANEL
Chol/HDL Ratio: 4.4 ratio (ref 0.0–4.4)
Cholesterol, Total: 181 mg/dL (ref 100–199)
HDL: 41 mg/dL (ref >39)
LDL Chol Calc (NIH): 104 mg/dL — ABNORMAL HIGH (ref 0–99)
Triglycerides: 210 mg/dL — ABNORMAL HIGH (ref 0–149)
VLDL Cholesterol Cal: 36 mg/dL (ref 5–40)

## 2024-06-29 NOTE — Patient Instructions (Signed)

## 2024-06-30 ENCOUNTER — Ambulatory Visit: Payer: Self-pay | Admitting: Cardiology

## 2024-07-01 LAB — LIPOPROTEIN A (LPA): Lipoprotein (a): 18.5 nmol/L (ref <75.0)

## 2024-07-24 ENCOUNTER — Encounter: Payer: Self-pay | Admitting: *Deleted

## 2024-09-28 ENCOUNTER — Other Ambulatory Visit

## 2024-10-05 ENCOUNTER — Ambulatory Visit: Admitting: Oncology

## 2025-05-24 ENCOUNTER — Inpatient Hospital Stay

## 2025-05-24 ENCOUNTER — Ambulatory Visit (HOSPITAL_COMMUNITY)

## 2025-05-31 ENCOUNTER — Inpatient Hospital Stay: Admitting: Oncology
